# Patient Record
Sex: Male | Born: 1935 | Race: White | Hispanic: No | Marital: Married | Smoking: Former smoker
Health system: Southern US, Community
[De-identification: ages and names within clinical notes are randomized; demographics above are authoritative.]

## PROBLEM LIST (undated history)

## (undated) DIAGNOSIS — K219 Gastro-esophageal reflux disease without esophagitis: Secondary | ICD-10-CM

## (undated) DIAGNOSIS — C3490 Malignant neoplasm of unspecified part of unspecified bronchus or lung: Secondary | ICD-10-CM

## (undated) DIAGNOSIS — Z950 Presence of cardiac pacemaker: Secondary | ICD-10-CM

## (undated) DIAGNOSIS — I2510 Atherosclerotic heart disease of native coronary artery without angina pectoris: Secondary | ICD-10-CM

## (undated) DIAGNOSIS — R05 Cough: Secondary | ICD-10-CM

## (undated) DIAGNOSIS — Z95828 Presence of other vascular implants and grafts: Secondary | ICD-10-CM

## (undated) DIAGNOSIS — I480 Paroxysmal atrial fibrillation: Secondary | ICD-10-CM

## (undated) DIAGNOSIS — Z87438 Personal history of other diseases of male genital organs: Secondary | ICD-10-CM

## (undated) DIAGNOSIS — H409 Unspecified glaucoma: Secondary | ICD-10-CM

## (undated) DIAGNOSIS — E7800 Pure hypercholesterolemia, unspecified: Secondary | ICD-10-CM

## (undated) DIAGNOSIS — I519 Heart disease, unspecified: Secondary | ICD-10-CM

## (undated) DIAGNOSIS — I272 Pulmonary hypertension, unspecified: Secondary | ICD-10-CM

## (undated) DIAGNOSIS — H9313 Tinnitus, bilateral: Secondary | ICD-10-CM

## (undated) DIAGNOSIS — Z7901 Long term (current) use of anticoagulants: Secondary | ICD-10-CM

## (undated) DIAGNOSIS — I495 Sick sinus syndrome: Secondary | ICD-10-CM

## (undated) DIAGNOSIS — K208 Other esophagitis without bleeding: Secondary | ICD-10-CM

## (undated) DIAGNOSIS — I482 Chronic atrial fibrillation, unspecified: Secondary | ICD-10-CM

## (undated) DIAGNOSIS — R001 Bradycardia, unspecified: Secondary | ICD-10-CM

## (undated) DIAGNOSIS — E669 Obesity, unspecified: Secondary | ICD-10-CM

## (undated) DIAGNOSIS — N182 Chronic kidney disease, stage 2 (mild): Secondary | ICD-10-CM

## (undated) DIAGNOSIS — I5022 Chronic systolic (congestive) heart failure: Secondary | ICD-10-CM

## (undated) DIAGNOSIS — R053 Chronic cough: Secondary | ICD-10-CM

## (undated) DIAGNOSIS — C801 Malignant (primary) neoplasm, unspecified: Secondary | ICD-10-CM

## (undated) DIAGNOSIS — I10 Essential (primary) hypertension: Secondary | ICD-10-CM

## (undated) DIAGNOSIS — J189 Pneumonia, unspecified organism: Secondary | ICD-10-CM

## (undated) HISTORY — PX: COLONOSCOPY: SHX174

## (undated) HISTORY — DX: Bradycardia, unspecified: R00.1

## (undated) HISTORY — DX: Obesity, unspecified: E66.9

## (undated) HISTORY — DX: Sick sinus syndrome: I49.5

## (undated) HISTORY — DX: Other esophagitis without bleeding: K20.80

## (undated) HISTORY — DX: Atherosclerotic heart disease of native coronary artery without angina pectoris: I25.10

## (undated) HISTORY — DX: Chronic kidney disease, stage 2 (mild): N18.2

## (undated) HISTORY — DX: Chronic cough: R05.3

## (undated) HISTORY — DX: Unspecified glaucoma: H40.9

## (undated) HISTORY — DX: Pulmonary hypertension, unspecified: I27.20

## (undated) HISTORY — DX: Gastro-esophageal reflux disease without esophagitis: K21.9

## (undated) HISTORY — DX: Malignant neoplasm of unspecified part of unspecified bronchus or lung: C34.90

## (undated) HISTORY — DX: Heart disease, unspecified: I51.9

## (undated) HISTORY — DX: Personal history of other diseases of male genital organs: Z87.438

## (undated) HISTORY — DX: Pneumonia, unspecified organism: J18.9

## (undated) HISTORY — DX: Cough: R05

## (undated) HISTORY — DX: Presence of cardiac pacemaker: Z95.0

## (undated) HISTORY — DX: Tinnitus, bilateral: H93.13

## (undated) HISTORY — DX: Chronic systolic (congestive) heart failure: I50.22

## (undated) HISTORY — DX: Paroxysmal atrial fibrillation: I48.0

## (undated) HISTORY — PX: INSERT / REPLACE / REMOVE PACEMAKER: SUR710

## (undated) HISTORY — DX: Essential (primary) hypertension: I10

## (undated) HISTORY — PX: CARDIAC CATHETERIZATION: SHX172

## (undated) HISTORY — DX: Chronic atrial fibrillation, unspecified: I48.20

## (undated) HISTORY — DX: Pure hypercholesterolemia, unspecified: E78.00

## (undated) HISTORY — DX: Long term (current) use of anticoagulants: Z79.01

---
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
## 2003-01-08 ENCOUNTER — Ambulatory Visit (HOSPITAL_COMMUNITY): Admission: RE | Admit: 2003-01-08 | Discharge: 2003-01-08 | Payer: Self-pay | Admitting: Pulmonary Disease

## 2003-06-21 ENCOUNTER — Other Ambulatory Visit: Admission: RE | Admit: 2003-06-21 | Discharge: 2003-06-21 | Payer: Self-pay | Admitting: Dermatology

## 2005-01-28 ENCOUNTER — Other Ambulatory Visit: Admission: RE | Admit: 2005-01-28 | Discharge: 2005-01-28 | Payer: Self-pay | Admitting: Dermatology

## 2005-10-12 ENCOUNTER — Ambulatory Visit (HOSPITAL_COMMUNITY): Admission: RE | Admit: 2005-10-12 | Discharge: 2005-10-12 | Payer: Self-pay | Admitting: Pulmonary Disease

## 2008-04-30 HISTORY — PX: CORONARY STENT PLACEMENT: SHX1402

## 2008-05-01 ENCOUNTER — Inpatient Hospital Stay (HOSPITAL_COMMUNITY): Admission: EM | Admit: 2008-05-01 | Discharge: 2008-05-05 | Payer: Self-pay | Admitting: Cardiology

## 2008-05-01 ENCOUNTER — Encounter: Payer: Self-pay | Admitting: Emergency Medicine

## 2010-01-13 ENCOUNTER — Ambulatory Visit: Payer: Self-pay | Admitting: Cardiology

## 2010-01-13 ENCOUNTER — Encounter: Admission: RE | Admit: 2010-01-13 | Discharge: 2010-01-13 | Payer: Self-pay | Admitting: Cardiology

## 2010-06-12 LAB — BASIC METABOLIC PANEL
BUN: 10 mg/dL (ref 6–23)
BUN: 10 mg/dL (ref 6–23)
BUN: 8 mg/dL (ref 6–23)
BUN: 8 mg/dL (ref 6–23)
CO2: 26 mEq/L (ref 19–32)
CO2: 28 mEq/L (ref 19–32)
CO2: 28 mEq/L (ref 19–32)
CO2: 28 mEq/L (ref 19–32)
Calcium: 8.3 mg/dL — ABNORMAL LOW (ref 8.4–10.5)
Calcium: 8.3 mg/dL — ABNORMAL LOW (ref 8.4–10.5)
Calcium: 8.8 mg/dL (ref 8.4–10.5)
Calcium: 8.8 mg/dL (ref 8.4–10.5)
Chloride: 101 mEq/L (ref 96–112)
Chloride: 103 mEq/L (ref 96–112)
Chloride: 104 mEq/L (ref 96–112)
Chloride: 105 mEq/L (ref 96–112)
Creatinine, Ser: 0.98 mg/dL (ref 0.4–1.5)
Creatinine, Ser: 1.08 mg/dL (ref 0.4–1.5)
Creatinine, Ser: 1.11 mg/dL (ref 0.4–1.5)
Creatinine, Ser: 1.15 mg/dL (ref 0.4–1.5)
GFR calc Af Amer: >60 mL/min (ref >60)
GFR calc Af Amer: >60 mL/min (ref >60)
GFR calc Af Amer: >60 mL/min (ref >60)
GFR calc Af Amer: >60 mL/min (ref >60)
GFR calc non Af Amer: >60 mL/min (ref >60)
GFR calc non Af Amer: >60 mL/min (ref >60)
GFR calc non Af Amer: >60 mL/min (ref >60)
GFR calc non Af Amer: >60 mL/min (ref >60)
Glucose, Bld: 136 mg/dL — ABNORMAL HIGH (ref 70–99)
Glucose, Bld: 89 mg/dL (ref 70–99)
Glucose, Bld: 93 mg/dL (ref 70–99)
Glucose, Bld: 95 mg/dL (ref 70–99)
Potassium: 3.6 mEq/L (ref 3.5–5.1)
Potassium: 3.7 mEq/L (ref 3.5–5.1)
Potassium: 3.8 mEq/L (ref 3.5–5.1)
Potassium: 4.1 mEq/L (ref 3.5–5.1)
Sodium: 133 mEq/L — ABNORMAL LOW (ref 135–145)
Sodium: 134 mEq/L — ABNORMAL LOW (ref 135–145)
Sodium: 136 mEq/L (ref 135–145)
Sodium: 137 mEq/L (ref 135–145)

## 2010-06-12 LAB — LIPID PANEL
Cholesterol: 145 mg/dL (ref 0–200)
HDL: 19 mg/dL — ABNORMAL LOW (ref >39)
LDL Cholesterol: 99 mg/dL (ref 0–99)
Total CHOL/HDL Ratio: 7.6 RATIO
Triglycerides: 133 mg/dL (ref <150)
VLDL: 27 mg/dL (ref 0–40)

## 2010-06-12 LAB — CBC
HCT: 35.7 % — ABNORMAL LOW (ref 39.0–52.0)
HCT: 39.5 % (ref 39.0–52.0)
HCT: 40.5 % (ref 39.0–52.0)
HCT: 40.6 % (ref 39.0–52.0)
HCT: 42.9 % (ref 39.0–52.0)
Hemoglobin: 12.6 g/dL — ABNORMAL LOW (ref 13.0–17.0)
Hemoglobin: 13.9 g/dL (ref 13.0–17.0)
Hemoglobin: 14.1 g/dL (ref 13.0–17.0)
Hemoglobin: 14.1 g/dL (ref 13.0–17.0)
Hemoglobin: 14.8 g/dL (ref 13.0–17.0)
MCHC: 34.6 g/dL (ref 30.0–36.0)
MCHC: 34.8 g/dL (ref 30.0–36.0)
MCHC: 34.9 g/dL (ref 30.0–36.0)
MCHC: 35.2 g/dL (ref 30.0–36.0)
MCHC: 35.4 g/dL (ref 30.0–36.0)
MCV: 96.3 fL (ref 78.0–100.0)
MCV: 97 fL (ref 78.0–100.0)
MCV: 97.1 fL (ref 78.0–100.0)
MCV: 97.1 fL (ref 78.0–100.0)
MCV: 97.2 fL (ref 78.0–100.0)
Platelets: 130 10*3/uL — ABNORMAL LOW (ref 150–400)
Platelets: 138 10*3/uL — ABNORMAL LOW (ref 150–400)
Platelets: 155 10*3/uL (ref 150–400)
Platelets: 157 10*3/uL (ref 150–400)
Platelets: 162 10*3/uL (ref 150–400)
RBC: 3.68 MIL/uL — ABNORMAL LOW (ref 4.22–5.81)
RBC: 4.07 MIL/uL — ABNORMAL LOW (ref 4.22–5.81)
RBC: 4.16 MIL/uL — ABNORMAL LOW (ref 4.22–5.81)
RBC: 4.18 MIL/uL — ABNORMAL LOW (ref 4.22–5.81)
RBC: 4.45 MIL/uL (ref 4.22–5.81)
RDW: 13.2 % (ref 11.5–15.5)
RDW: 13.2 % (ref 11.5–15.5)
RDW: 13.3 % (ref 11.5–15.5)
RDW: 13.3 % (ref 11.5–15.5)
RDW: 13.5 % (ref 11.5–15.5)
WBC: 6.7 10*3/uL (ref 4.0–10.5)
WBC: 6.8 10*3/uL (ref 4.0–10.5)
WBC: 7.3 10*3/uL (ref 4.0–10.5)
WBC: 8.3 10*3/uL (ref 4.0–10.5)
WBC: 8.6 10*3/uL (ref 4.0–10.5)

## 2010-06-12 LAB — CARDIAC PANEL(CRET KIN+CKTOT+MB+TROPI)
CK, MB: 3.7 ng/mL (ref 0.3–4.0)
CK, MB: 3.8 ng/mL (ref 0.3–4.0)
CK, MB: 4.5 ng/mL — ABNORMAL HIGH (ref 0.3–4.0)
CK, MB: 5.4 ng/mL — ABNORMAL HIGH (ref 0.3–4.0)
CK, MB: 6.1 ng/mL — ABNORMAL HIGH (ref 0.3–4.0)
Relative Index: 3.7 — ABNORMAL HIGH (ref 0.0–2.5)
Relative Index: 6.1 — ABNORMAL HIGH (ref 0.0–2.5)
Relative Index: INVALID (ref 0.0–2.5)
Relative Index: INVALID (ref 0.0–2.5)
Relative Index: INVALID (ref 0.0–2.5)
Total CK: 100 U/L (ref 7–232)
Total CK: 101 U/L (ref 7–232)
Total CK: 74 U/L (ref 7–232)
Total CK: 81 U/L (ref 7–232)
Total CK: 90 U/L (ref 7–232)
Troponin I: 0.02 ng/mL (ref 0.00–0.06)
Troponin I: 0.06 ng/mL (ref 0.00–0.06)
Troponin I: 0.11 ng/mL — ABNORMAL HIGH (ref 0.00–0.06)
Troponin I: 0.13 ng/mL — ABNORMAL HIGH (ref 0.00–0.06)
Troponin I: 0.25 ng/mL — ABNORMAL HIGH (ref 0.00–0.06)

## 2010-06-12 LAB — COMPREHENSIVE METABOLIC PANEL
ALT: 20 U/L (ref 0–53)
ALT: 23 U/L (ref 0–53)
AST: 19 U/L (ref 0–37)
AST: 23 U/L (ref 0–37)
Albumin: 3.3 g/dL — ABNORMAL LOW (ref 3.5–5.2)
Albumin: 4 g/dL (ref 3.5–5.2)
Alkaline Phosphatase: 44 U/L (ref 39–117)
Alkaline Phosphatase: 52 U/L (ref 39–117)
BUN: 10 mg/dL (ref 6–23)
BUN: 10 mg/dL (ref 6–23)
CO2: 25 mEq/L (ref 19–32)
CO2: 28 mEq/L (ref 19–32)
Calcium: 8.3 mg/dL — ABNORMAL LOW (ref 8.4–10.5)
Calcium: 9.1 mg/dL (ref 8.4–10.5)
Chloride: 101 mEq/L (ref 96–112)
Chloride: 99 mEq/L (ref 96–112)
Creatinine, Ser: 1.02 mg/dL (ref 0.4–1.5)
Creatinine, Ser: 1.16 mg/dL (ref 0.4–1.5)
GFR calc Af Amer: >60 mL/min (ref >60)
GFR calc Af Amer: >60 mL/min (ref >60)
GFR calc non Af Amer: >60 mL/min (ref >60)
GFR calc non Af Amer: >60 mL/min (ref >60)
Glucose, Bld: 128 mg/dL — ABNORMAL HIGH (ref 70–99)
Glucose, Bld: 138 mg/dL — ABNORMAL HIGH (ref 70–99)
Potassium: 3.3 mEq/L — ABNORMAL LOW (ref 3.5–5.1)
Potassium: 3.6 mEq/L (ref 3.5–5.1)
Sodium: 133 mEq/L — ABNORMAL LOW (ref 135–145)
Sodium: 136 mEq/L (ref 135–145)
Total Bilirubin: 0.3 mg/dL (ref 0.3–1.2)
Total Bilirubin: 0.7 mg/dL (ref 0.3–1.2)
Total Protein: 5.6 g/dL — ABNORMAL LOW (ref 6.0–8.3)
Total Protein: 6.7 g/dL (ref 6.0–8.3)

## 2010-06-12 LAB — DIFFERENTIAL
Basophils Absolute: 0 10*3/uL (ref 0.0–0.1)
Basophils Absolute: 0 10*3/uL (ref 0.0–0.1)
Basophils Relative: 0 % (ref 0–1)
Basophils Relative: 1 % (ref 0–1)
Eosinophils Absolute: 0.1 10*3/uL (ref 0.0–0.7)
Eosinophils Absolute: 0.1 10*3/uL (ref 0.0–0.7)
Eosinophils Relative: 1 % (ref 0–5)
Eosinophils Relative: 2 % (ref 0–5)
Lymphocytes Relative: 26 % (ref 12–46)
Lymphocytes Relative: 29 % (ref 12–46)
Lymphs Abs: 1.7 10*3/uL (ref 0.7–4.0)
Lymphs Abs: 2 10*3/uL (ref 0.7–4.0)
Monocytes Absolute: 0.3 10*3/uL (ref 0.1–1.0)
Monocytes Absolute: 0.4 10*3/uL (ref 0.1–1.0)
Monocytes Relative: 4 % (ref 3–12)
Monocytes Relative: 6 % (ref 3–12)
Neutro Abs: 4.2 10*3/uL (ref 1.7–7.7)
Neutro Abs: 4.6 10*3/uL (ref 1.7–7.7)
Neutrophils Relative %: 62 % (ref 43–77)
Neutrophils Relative %: 68 % (ref 43–77)

## 2010-06-12 LAB — POCT CARDIAC MARKERS
CKMB, poc: 1.5 ng/mL (ref 1.0–8.0)
Myoglobin, poc: 95.7 ng/mL (ref 12–200)
Troponin i, poc: <0.05 ng/mL (ref 0.00–0.09)

## 2010-06-12 LAB — POCT I-STAT, CHEM 8
BUN: 10 mg/dL (ref 6–23)
Calcium, Ion: 1.22 mmol/L (ref 1.12–1.32)
Chloride: 102 mEq/L (ref 96–112)
Creatinine, Ser: 1.1 mg/dL (ref 0.4–1.5)
Glucose, Bld: 131 mg/dL — ABNORMAL HIGH (ref 70–99)
HCT: 42 % (ref 39.0–52.0)
Hemoglobin: 14.3 g/dL (ref 13.0–17.0)
Potassium: 3.4 mEq/L — ABNORMAL LOW (ref 3.5–5.1)
Sodium: 136 mEq/L (ref 135–145)
TCO2: 24 mmol/L (ref 0–100)

## 2010-06-12 LAB — PROTIME-INR
INR: 1.1 (ref 0.00–1.49)
Prothrombin Time: 14.9 seconds (ref 11.6–15.2)

## 2010-06-12 LAB — APTT: aPTT: 56 seconds — ABNORMAL HIGH (ref 24–37)

## 2010-06-12 LAB — BRAIN NATRIURETIC PEPTIDE: Pro B Natriuretic peptide (BNP): 91.4 pg/mL (ref 0.0–100.0)

## 2010-07-15 NOTE — Cardiovascular Report (Signed)
NAME:  Nicholas Sanchez, Nicholas Sanchez NO.:  [id number]   MEDICAL RECORD NO.:  [id number]          PATIENT TYPE:  INP   LOCATION:  2909                         FACILITY:  [facility]   PHYSICIAN:  Colleen Can. Deborah Chalk, M.D.DATE OF BIRTH:  [birth year]   DATE OF PROCEDURE:  05/01/2008  DATE OF DISCHARGE:                            CARDIAC CATHETERIZATION   HISTORY:  Mr. Grobe presents with acute ST-elevation myocardial  infarction with a 3-day history of chest pain, that is worse throughout  the day.  EKG showed ST elevation inferiorly with reciprocal changes.   PROCEDURE:  Left heart catheterization with selective coronary  angiography, left ventricular angiography, and stent placement  sequentially in the large dominant right coronary artery with left  ventricular angiography.   TYPE AND SITE OF ENTRY:  Percutaneous right femoral artery with Angio-  Seal.   CATHETERS:  6-French, 4-curved Judkins right and left coronary catheter,  6-French pigtail ventriculographic catheter, a JR-4 with side holes  guide, a right coronary artery bypass graft with side holes guide, and  an L2 Amplatz guide with side holes.   GUIDEWIRES:  Prowater and BMW.   ANGIOPLASTY BALLOONS:  A 3.0 x 15-mm apex balloon to predilate and a 3.5  x 15-mm Multi-Link stent in both proximal and distally.   HEMODYNAMIC DATA:  The aortic pressure was 112/53.  LV pressure was  115/4-13.   ANGIOGRAPHIC DATA:  1. Left main coronary artery is large and normal.  2. Left circumflex was a small vessel.  It had a small obtuse marginal      and irregularities.  3. Left anterior descending had severe stenosis proximally before the      first septal perforating branch.  It was a moderate-size vessel      that would be between 2.5 and 3 mm in diameter.  The proximal      stenosis is estimated to be at 70%.  4. Intermediate coronary artery.  There is a moderate-size      intermediate coronary artery without severe focal  narrowing.  5. Right coronary artery.  The right coronary artery is a large      dominant vessel.  There is 90% stenosis proximally and a 95%      stenosis before the crux.   Left ventricular angiogram was performed in the RAO position.  Overall  cardiac size and silhouette were normal.  Global ejection fraction  estimated to be at 50-55%.  There is very minimal inferior hypokinesis  present.   ANGIOPLASTY PROCEDURE:  We turned our attention to the right coronary  artery which obviously was then culprit vessel.  However, it did have  TIMI grade 3 flow and the resultant preparation for the procedure  resulted in mild delays which were felt to not be consequential because  of the adequate flow.  It should be noted that door-to-balloon time may  be slightly increased in large part related to the fact that there was  TIMI grade 3 flow to this vessel.  We initially used the FR-4 with side  holes guide and did not have satisfactory backup.  We turned  our  attention to the guide and used the right coronary artery bypass graft  with side holes and we were able to predilate with a 3.0 apex balloon  both proximally and distally.  However, we were unable to pass the stent  with this guide.  We then removed the guidewires, we then used an AL-1  with side holes guide.  With that and with a Prowater wire and with a  BMW wire as a buddy wire, we were able to pass the Multi-Link Vision  stent across distal stenosis.  It was inflated to maximum of 17  atmospheres.  We placed a second Multi-Link Vision stent in the proximal  portion.  It was inflated to a maximum of 16 atmospheres.  The final  angiographic result was felt to be excellent with no residual stenosis.   OVERALL IMPRESSION:  1. Stuttering inferior myocardial infarction with well-preserved      global left ventricular function.  2. Successful stent placement of severe stenosis in the right coronary      artery.  3. Residual severe stenosis  in the proximal left anterior descending      coronary.   DISCUSSION:  We will address the left anterior descending coronary at a  different setting.      Colleen Can. Deborah Chalk, M.D.  Electronically Signed     SNT/MEDQ  D:  05/01/2008  T:  05/02/2008  Job:  [id number]   cc:   Samuel Jester, DO  Oneal Deputy. Juanetta Gosling, M.D.

## 2010-07-15 NOTE — H&P (Signed)
NAME:  CECILE, GUEVARA NO.:  [id number]   MEDICAL RECORD NO.:  [id number]          PATIENT TYPE:  INP   LOCATION:  2909                         FACILITY:  [facility]   PHYSICIAN:  Colleen Can. Deborah Chalk, M.D.DATE OF BIRTH:  1933-12-21   DATE OF ADMISSION:  05/01/2008  DATE OF DISCHARGE:                              HISTORY & PHYSICAL   CHIEF COMPLAINT:  Chest pain. Left arm pain.   HISTORY OF PRESENT ILLNESS:  Mr. Delo is a 77 year old male who  presents with acute ST elevation MI.  Code STEMI has been called.  He  has had a 3-day history of left arm pain and numbness that worsened  today.  He presented to [facility] where he was noted to have  ST elevation in leads II, III, and aVF with reciprocal changes.  He was  subsequently transferred to the [facility]ter[facility]  for acute intervention.   PAST MEDICAL HISTORY:  1. Hypertension.  2. GERD.  3. LVH.   ALLERGIES:  None.   CURRENT MEDICATIONS:  Metoprolol and hydrochlorothiazide. Doses are not  able to be obtained at this time.   FAMILY HISTORY:  Not able to be obtained due to the emergent nature of  the situation.   SOCIAL HISTORY:  He is married.  He is retired from Airline pilot.  He currently  does not smoke and has no alcohol use.   REVIEW OF SYSTEMS:  He has had some GERD, but otherwise he has really  been in good state of health.  He had a recent physical at the [place].  All  other review of systems are negative.   PHYSICAL EXAMINATION:  GENERAL:  He is awake, alert, and able to answer  appropriately.  VITAL SIGNS:  Blood pressure was 140/87, heart rate is 59, respiratory  rate is 20, O2 saturation was 100%.  SKIN:  Warm and dry.  Color is unremarkable.  HEENT:  Normocephalic and atraumatic.  Pupils are equal.  Conjunctiva is  normal.  NECK:  Supple.  No masses.  No JVD.  LUNGS:  Essentially clear. Not dyspneic.  CARDIAC:  Regular rate and rhythm.  There is no murmur.  ABDOMEN:   Soft.  Positive bowel sounds and nontender.  EXTREMITIES:  Without edema.  MUSCULOSKELETAL:  Range of motion and strength appear to be adequate and  normal. Gait not tested.  NEUROLOGIC:  No gross focal deficits.   PERTINENT LABORATORIES:  EKG shows ST elevation in leads II, III, and  aVF with reciprocal changes in I and AVL versus LVH. Other labs are in  process.   ASSESSMENT:  1. Acute STEMI.  2. Left ventricular hypertrophy.  3. History of hypertension.  4. Gastroesophageal reflux disease.  5. Obesity.   PLAN:  The patient is taken acutely to the cardiac catheterization lab.  The risks, procedure, and benefits have all been explained and he is  willing to proceed on. Further treatment to follow per Dr. Ronnald Nian  discretion.      Sharlee Blew, N.P.      Colleen Can. Deborah Chalk, M.D.  Electronically Signed  LC/MEDQ  D:  05/01/2008  T:  05/02/2008  Job:  [id number]

## 2010-07-15 NOTE — Cardiovascular Report (Signed)
NAME:  Nicholas Sanchez, Nicholas Sanchez NO.:  [id number]   MEDICAL RECORD NO.:  [id number]          PATIENT TYPE:  INP   LOCATION:  2502                         FACILITY:  [facility]   PHYSICIAN:  Colleen Can. Deborah Chalk, M.D.DATE OF BIRTH:  1933/04/17   DATE OF PROCEDURE:  05/04/2008  DATE OF DISCHARGE:                            CARDIAC CATHETERIZATION   PROCEDURE:  Stent in the left anterior descending with a re-look at the  previous stents placed in right coronary artery.   TYPE AND SITE OF ENTRY:  Percutaneous right femoral artery with Angio-  Seal.   CATHETERS:  A 6-French JR-4 diagnostic catheter, 6-French FL-4 guide  catheter (only marginally satisfactory), Prowater guidewire,  predilatation with a 2.5 x 15-mm Apex balloon with stent being a 23 x  3.5 Xience stent (drug-eluting) and a 20 mm x 3.75 Quantum Maverick for  post dilatation.   MEDICATION GIVEN PRIOR TO PROCEDURE:  Valium 10 mg p.o. as well as a  nitroglycerin drip.   MEDICATION GIVEN DURING THE PROCEDURE:  Angiomax and Versed.   COMMENT:  The patient tolerated the procedure well.   PROCEDURE:  Initial angiograms of the right coronary artery were  obtained.  This demonstrated persistent patency of the stent to the  proximal and distal right coronary artery.  The distal stent had some  mild haziness within the stent but there was no significant luminal  narrowing present.  There was excellent flow distally.   We then turned our attention to the left anterior descending.  We used a  6-French JL-4 guide.  This provided satisfactory backup, but perhaps was  slightly undersized.  The Prowater guidewire was positioned across the  stenosis.  We predilated with a 2.5 x 15 mm Apex balloon to 15  atmospheres.  We then were able to place the 3.5 x 23 mm Xience stent  without difficulty.  This was inflated to a maximum of 16 atmospheres.  We then returned with a 3.75 x 20-mm Quantum Maverick and inflated to a  maximum of 20  atmospheres for 30 seconds.  The final angiographic result  was excellent.  There was a full dilatation of the stent.  It was a  relatively smooth transition from the stented segment to the native  vessel and if anything the stented segment would be slightly larger than  the native vessel and is felt to be an excellent result.   OVERALL IMPRESSION:  1. Persistent patency of the stents in the right coronary artery.  2. Successful stent placement in the proximal left anterior      descending.   ADDENDUM:  Angio-Seal was used and Ancef was given.      Colleen Can. Deborah Chalk, M.D.  Electronically Signed     SNT/MEDQ  D:  05/04/2008  T:  05/05/2008  Job:  [id number]   cc:   Ramon Dredge L. Juanetta Gosling, M.D.

## 2010-07-18 ENCOUNTER — Encounter: Payer: Self-pay | Admitting: Cardiology

## 2010-07-18 NOTE — Discharge Summary (Signed)
NAME:  Nicholas Sanchez, Nicholas Sanchez NO.:  [id number]   MEDICAL RECORD NO.:  [id number]          PATIENT TYPE:  INP   LOCATION:  2502                         FACILITY:  [facility]   PHYSICIAN:  Colleen Can. Deborah Chalk, M.D.DATE OF BIRTH:  [birth year]   DATE OF ADMISSION:  05/01/2008  DATE OF DISCHARGE:  05/05/2008                               DISCHARGE SUMMARY   DISCHARGE DIAGNOSES:  1. ST-elevation myocardial infarction, inferiorly status post emergent      percutaneous coronary intervention.  Cardiac catheterization      findings on May 01, 2008, showed normal left main, small      irregularities in left circumflex, 70% plus proximal narrowing in      the left anterior descending, 90% proximal narrowing in the right      coronary artery with a 95% distal narrowing, ejection fraction was      55%.  Subsequent stenting x2 with a nondrug-eluting stents were      placed, these were Vision 3.5 x 15 mm and 3.5 x 15 mm with an      overall excellent result obtained.  2. Residual coronary artery disease with subsequent repeat cardiac      catheterization on May 04, 2008, showing the stents in the right      coronary to be patent.  Subsequent angioplasty and stenting of the      proximal left anterior descending ensued with a 3.5 x 23 mm Xience      (drug eluting) stent placed with an overall satisfactory result      obtained.  The patient is committed to Plavix for 1 year.  3. Hypertension.  4. Gastroesophageal reflux disease.  5. Left ventricular hypertrophy.  6. Hyperlipidemia.   HISTORY OF PRESENT ILLNESS:  The patient is a 77 year old white male who  presented emergently to the [facility] with an acute ST-  elevation MI.  Code STEMI had been called.  He reported a 3-day history  of left arm pain and numbness that worsened on the day of admission.  When he presented to [facility] originally, he was noted to  have ST elevation in leads II, III, and aVF with typical  changes.  He  was subsequently brought to the Cardiac Catheterization Lab at [facility] for acute intervention.   Please see the history and physical for further patient's presentation  and profile.   LABORATORY DATA:  On admission, basically, his cardiac enzymes were  negative; however, his third troponin was slightly positive at 0.11.  BNP was 91.  CBC was normal with a hematocrit of 40, white count 8,  platelets 157.  Chemistry showed a sodium 137, potassium 4.1, chloride  104, CO2 of 28, BUN was 10, creatinine 1.1, and a glucose of 136.  His  EKG showed inferior and lateral changes.  His chest x-ray showed COPD.   HOSPITAL COURSE:  The patient was taken emergently upon arrival to the  Cardiac Catheterization Lab.  That procedure was tolerated well without  any known complications.  The results are as noted above.  He  subsequently  underwent percutaneous coronary intervention to the right  coronary artery.  He had 2 nondrug-eluting stents placed.  That  procedure was tolerated well, and he was transferred to the Coronary  Care Unit afterwards.  By the following morning, he had some  intermittent dizziness as well as some nausea, vomiting, and headache,  felt to be secondary to IV nitroglycerin.  This was subsequently  stopped, and the patient was treated with IV fluids.  Unfortunately, he  developed left arm pain again the following day for about 10-15 minutes.  It was responsive to nitroglycerin.  IV nitroglycerin was subsequently  resumed, and at this time, it was felt that he would need to have  intervention to the LAD before the patient was deemed ready for  discharge.  On May 04, 2008, he underwent repeat cardiac  catheterization.  Cineangiograms of the right coronary showed the stents  to be okay.  Subsequently, he had PCI to the proximal LAD with a 3.5 x  23 mm Xience (drug-eluting stent).  Postprocedure, he was transferred to  2500 and by May 05, 2008, he was  doing well without complaints and was  felt to be a stable candidate for discharge.   DISCHARGE DIET:  Low salt, heart healthy.   ACTIVITIES:  Increased as tolerated with no driving and no sexual  activity.   He is to use an ice pack as needed to the groin.   DISCHARGE MEDICINES:  1. Lopressor 25 b.i.d.  2. Zocor 40 mg a day.  3. Plavix 75 mg a day for 1 year.  4. Nitroglycerin p.r.n.  5. Enteric-coated aspirin 325 a day.  6. His hydrochlorothiazide that he was taking at home has been placed      on hold.   We will plan on seeing him back in the office in approximately 1 week,  certainly sooner if any problems would arise in the interim.      Sharlee Blew, N.P.      Colleen Can. Deborah Chalk, M.D.  Electronically Signed    LC/MEDQ  D:  05/31/2008  T:  06/01/2008  Job:  [id number]

## 2010-07-21 ENCOUNTER — Encounter: Payer: Self-pay | Admitting: Cardiology

## 2010-07-21 ENCOUNTER — Ambulatory Visit (INDEPENDENT_AMBULATORY_CARE_PROVIDER_SITE_OTHER): Payer: Medicare Other | Admitting: Cardiology

## 2010-07-21 VITALS — BP 122/84 | HR 56 | Ht 73.0 in | Wt 223.2 lb

## 2010-07-21 DIAGNOSIS — K219 Gastro-esophageal reflux disease without esophagitis: Secondary | ICD-10-CM | POA: Insufficient documentation

## 2010-07-21 DIAGNOSIS — E7800 Pure hypercholesterolemia, unspecified: Secondary | ICD-10-CM | POA: Insufficient documentation

## 2010-07-21 DIAGNOSIS — I10 Essential (primary) hypertension: Secondary | ICD-10-CM | POA: Insufficient documentation

## 2010-07-21 DIAGNOSIS — I2510 Atherosclerotic heart disease of native coronary artery without angina pectoris: Secondary | ICD-10-CM

## 2010-07-21 DIAGNOSIS — I259 Chronic ischemic heart disease, unspecified: Secondary | ICD-10-CM | POA: Insufficient documentation

## 2010-07-21 NOTE — Progress Notes (Signed)
Subjective:   Mr. Kerschner comes in today for followup visit. In general, he continues to do well. He's not had any chest pain or shortness of breath but he is not exercising on the greater basis. He has a history of stents in his right coronary artery and a stent in his left anterior descending in March 2010. He has a MultiLink stent in his right coronary artery the drug-eluting stent in his left anterior descending. In general, he's done well has not had recurrent cardiac symptoms. His last stress Cardiolite study was in October 2010 which was interpreted as being normal.   Current Outpatient Prescriptions  Medication Sig Dispense Refill  . aspirin 325 MG tablet Take 325 mg by mouth daily.        . clopidogrel (PLAVIX) 75 MG tablet Take 75 mg by mouth daily.        . cyanocobalamin 1000 MCG tablet Take 100 mcg by mouth daily.        . hydrochlorothiazide 25 MG tablet Take 25 mg by mouth daily.        . metoprolol tartrate (LOPRESSOR) 25 MG tablet Take 25 mg by mouth 2 (two) times daily.        [name] omeprazole (PRILOSEC) 40 MG capsule Take 40 mg by mouth daily.        . pravastatin (PRAVACHOL) 40 MG tablet Take 40 mg by mouth daily.        . Tamsulosin HCl (FLOMAX) 0.4 MG CAPS Take 0.4 mg by mouth daily. Not taking      . DISCONTD: famotidine (PEPCID) 40 MG tablet Take 40 mg by mouth at bedtime.        [name] DISCONTD: lisinopril (PRINIVIL,ZESTRIL) 5 MG tablet Take 5 mg by mouth daily.          Allergies  Allergen Reactions  . Ace Inhibitors     Patient Active Problem List  Diagnoses  . CAD (coronary artery disease)  . Ischemic heart disease  . HTN (hypertension)  . Hypercholesteremia  . GERD (gastroesophageal reflux disease)    History  Smoking status  . Former Smoker -- 1.0 packs/day for 14 years  . Types: Cigarettes  . Quit date: 03/02/1973  Smokeless tobacco  . Former Neurosurgeon  Comment: some    History  Alcohol Use No    Family History  Problem Relation Age of Onset  .  Emphysema Mother     mother died with emphysema and cancer  . Cancer Mother     Review of Systems:   The patient denies any heat or cold intolerance.  No weight gain or weight loss.  The patient denies headaches or blurry vision.  There is no cough or sputum production.  The patient denies dizziness.  There is no hematuria or hematochezia.  The patient denies any muscle aches or arthritis.  The patient denies any rash.  The patient denies frequent falling or instability.  There is no history of depression or anxiety.  All other systems were reviewed and are negative.   Physical Exam:   Weight is 223. Blood pressure 120/84. Heart rate 56.The head is normocephalic and atraumatic.  Pupils are equally round and reactive to light.  Sclerae nonicteric.  Conjunctiva is clear.  Oropharynx is unremarkable.  There's adequate oral airway.  Neck is supple there are no masses.  Thyroid is not enlarged.  There is no lymphadenopathy.  Lungs are clear.  Chest is symmetric.  Heart shows a regular rate and rhythm.  S1 and S2 are normal.  There is no murmur click or gallop.  Abdomen is soft normal bowel sounds.  There is no organomegaly.  Genital and rectal deferred.  Extremities are without edema.  Peripheral pulses are adequate.  Neurologically intact.  Full range of motion.  The patient is not depressed.  Skin is warm and dry.  Assessment / Plan:

## 2010-07-22 NOTE — Assessment & Plan Note (Signed)
Overall, he continues to do well.  We will continue aspirin and Plavix. He has lab work pending at the [place]. I will have him see Dr. Andee Lineman in folow up.

## 2010-08-28 ENCOUNTER — Other Ambulatory Visit: Payer: Self-pay | Admitting: *Deleted

## 2010-08-28 DIAGNOSIS — E785 Hyperlipidemia, unspecified: Secondary | ICD-10-CM

## 2010-08-29 ENCOUNTER — Encounter: Payer: Self-pay | Admitting: Cardiology

## 2010-08-29 ENCOUNTER — Other Ambulatory Visit (INDEPENDENT_AMBULATORY_CARE_PROVIDER_SITE_OTHER): Payer: Medicare Other | Admitting: *Deleted

## 2010-08-29 DIAGNOSIS — E785 Hyperlipidemia, unspecified: Secondary | ICD-10-CM

## 2010-08-29 LAB — BASIC METABOLIC PANEL
BUN: 15 mg/dL (ref 6–23)
CO2: 30 mEq/L (ref 19–32)
Calcium: 9.3 mg/dL (ref 8.4–10.5)
Chloride: 104 mEq/L (ref 96–112)
Creatinine, Ser: 1.2 mg/dL (ref 0.4–1.5)
GFR: 62.7 mL/min (ref >60.00)
Glucose, Bld: 98 mg/dL (ref 70–99)
Potassium: 4.5 mEq/L (ref 3.5–5.1)
Sodium: 139 mEq/L (ref 135–145)

## 2010-08-29 LAB — HEPATIC FUNCTION PANEL
ALT: 24 U/L (ref 0–53)
AST: 26 U/L (ref 0–37)
Albumin: 4.5 g/dL (ref 3.5–5.2)
Alkaline Phosphatase: 54 U/L (ref 39–117)
Bilirubin, Direct: 0.2 mg/dL (ref 0.0–0.3)
Total Bilirubin: 1 mg/dL (ref 0.3–1.2)
Total Protein: 7.1 g/dL (ref 6.0–8.3)

## 2010-08-29 LAB — LIPID PANEL
Cholesterol: 136 mg/dL (ref 0–200)
HDL: 30.8 mg/dL — ABNORMAL LOW (ref >39.00)
LDL Cholesterol: 86 mg/dL (ref 0–99)
Total CHOL/HDL Ratio: 4
Triglycerides: 94 mg/dL (ref 0.0–149.0)
VLDL: 18.8 mg/dL (ref 0.0–40.0)

## 2010-09-02 ENCOUNTER — Telehealth: Payer: Self-pay | Admitting: *Deleted

## 2010-09-02 NOTE — Telephone Encounter (Signed)
Notified of lab results. Sent to Dr. Juanetta Gosling.

## 2010-09-02 NOTE — Telephone Encounter (Signed)
Message copied by Lorayne Bender on Tue Sep 02, 2010 10:02 AM ------      Message from: Rosalio Macadamia      Created: Fri Aug 29, 2010  3:06 PM       Ok to report. Labs are satisfactory.  Stay on same medicines. Recheck BMET/HPF/LIPIDS  in 6 months. Patient is to see Dr. Andee Lineman for future cardiology follow up. Former patient of Dr. Deborah Chalk

## 2010-11-26 ENCOUNTER — Other Ambulatory Visit: Payer: Self-pay | Admitting: *Deleted

## 2010-11-26 MED ORDER — METOPROLOL TARTRATE 50 MG PO TABS: 25.0000 mg | ORAL_TABLET | Freq: Two times a day (BID) | ORAL | Status: DC

## 2011-03-04 ENCOUNTER — Ambulatory Visit (INDEPENDENT_AMBULATORY_CARE_PROVIDER_SITE_OTHER): Payer: Medicare Other | Admitting: Cardiology

## 2011-03-04 ENCOUNTER — Encounter: Payer: Self-pay | Admitting: Cardiology

## 2011-03-04 VITALS — BP 159/77 | HR 51 | Ht 73.0 in | Wt 225.0 lb

## 2011-03-04 DIAGNOSIS — I2510 Atherosclerotic heart disease of native coronary artery without angina pectoris: Secondary | ICD-10-CM

## 2011-03-04 NOTE — Patient Instructions (Signed)
Continue all current medications. Your physician wants you to follow up in: 6 months.  You will receive a reminder letter in the mail one-two months in advance.  If you don't receive a letter, please call our office to schedule the follow up appointment   

## 2011-03-16 ENCOUNTER — Encounter: Payer: Self-pay | Admitting: Cardiology

## 2011-03-16 DIAGNOSIS — I2510 Atherosclerotic heart disease of native coronary artery without angina pectoris: Secondary | ICD-10-CM | POA: Insufficient documentation

## 2011-03-16 NOTE — Progress Notes (Signed)
Nicholas Bottoms, MD, [facility] Board Certified in Adult Cardiovascular Medicine,Internal Medicine and Critical Care Medicine    CC: followup patient with history of coronary artery disease, former patient of Dr. Marolyn Hammock  HPI:  The patient is a 78 year old male with history of coronary artery disease, status post multiple  stent placement in the setting of an ST elevation myocardial infarction in 2010.  He is currently not taking Plavix anymore.  He has a history of hypertension, GERD, and hyperlipidemia.  Laboratory work is followed at the [place].  Although his blood pressure is elevated in the office.  He reports to me that his readings at home are within normal limits.  Typically around 130 mmHg. From a cardiovascular perspective he is stable.  He denies any chest pain, shortness of breath, orthopnea, PND.  He is able to perform greater than 5 METS.  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 History of BPH   . CAD (coronary artery disease)     status post ST elevation myocardial infarction March 2010, status post bare-metal stent x2 to the proximal and distal RCA.  Subsequent stent drug-eluting stent to the LAD in a staged fashion.,  Cardiolite October 2010, negative for ischemia with normal ejection fraction  . GERD (gastroesophageal reflux disease)   . Cough, persistent   . HTN (hypertension)   . Hypercholesteremia    Past Surgical History  Procedure Date  . Coronary stent placement 04/2008    RCA and LAD    Allergies/SH/FHX : available in Electronic Records for review  Allergies  Allergen Reactions  . Ace Inhibitors    History   Social History  . Marital Status: Single    Spouse Name: N/A    Number of Children: N/A  . Years of Education: N/A   Occupational History  . retired     Airline pilot   Social History Main Topics  . Smoking  status: Former Smoker -- 2.5 packs/day for 25 years    Types: Cigarettes    Quit date: 06/14/1973  . Smokeless tobacco: Former Neurosurgeon    Types: Chew    Quit date: 06/14/1973   Comment: some/chewed very little for about 2 years  . Alcohol Use: No  . Drug Use: No  . Sexually Active: Not on file   Other Topics Concern  . Not on file   Social History Narrative  . No narrative on file   Family History  Problem Relation Age of Onset  . Emphysema Mother     mother died with emphysema and cancer  . Cancer Mother     Medications: Current Outpatient Prescriptions  Medication Sig Dispense Refill  . aspirin 325 MG tablet Take 325 mg by mouth daily.        . cyanocobalamin 1000 MCG tablet Take 100 mcg by mouth daily.        . hydrochlorothiazide 25 MG tablet Take 25 mg by mouth daily.        . metoprolol (LOPRESSOR) 50 MG tablet Take 0.5 tablets (25 mg total) by mouth 2 (two) times daily.  30 tablet  3  . omeprazole (PRILOSEC) 40 MG capsule Take 40 mg by mouth daily.        . pravastatin (PRAVACHOL) 40 MG tablet Take 40 mg by mouth 3 (three) times a week.       [name]  Tamsulosin HCl (FLOMAX) 0.4 MG CAPS Take 0.4 mg by mouth daily.         ROS: No nausea or vomiting. No fever or chills.No melena or hematochezia.No bleeding.No claudication  Physical Exam: BP 159/77  Pulse 51  Ht 6\' 1"  (1.854 m)  Wt 225 lb (102.059 kg)  BMI 29.69 kg/m2 General:Well-nourished white male in no apparent distress. Neck:normal carotid upstroke and no carotid bruits.  No thyromegaly no nodes or thyroid.  JVP is 5 cm Lungs:clear breath sounds bilaterally without any wheezing Cardiac:regular rate and rhythm with normal S1, S2.  No pathological murmurs Vascular:no edema.  Normal distal pulses bilaterally Skin:warm and dry Physcologic:normal affect  12lead ECG:not performed Limited bedside ECHO:N/A   Patient Active Problem List  Diagnoses  . HTN (hypertension)- reportedly controlled per patient  .  Hypercholesteremia  . GERD (gastroesophageal reflux disease)  . CAD (coronary artery disease)-status post ST elevation myocardial infarction and stent placement of Plavix 2010    PLAN   The patient is doing well from a cardiovascular perspective.  He denies any chest pain.  He reports no ischemic complaints.  A bedside echocardiogram was performed which showed normal LV function and no significant mitral valve or aortic valve disease  Blood work is performed at the [place], there is followed for his dyslipidemia  Patient will followup as blood pressure at home, but it appears to be within normal limits and have made no changes in his medical regimen.

## 2011-03-24 ENCOUNTER — Other Ambulatory Visit: Payer: Self-pay | Admitting: *Deleted

## 2011-03-24 MED ORDER — METOPROLOL TARTRATE 50 MG PO TABS: 25.0000 mg | ORAL_TABLET | Freq: Two times a day (BID) | ORAL | Status: DC

## 2011-08-28 ENCOUNTER — Other Ambulatory Visit: Payer: Self-pay | Admitting: *Deleted

## 2011-08-28 ENCOUNTER — Telehealth: Payer: Self-pay | Admitting: Cardiology

## 2011-08-28 MED ORDER — PRAVASTATIN SODIUM 40 MG PO TABS: 40.0000 mg | ORAL_TABLET | ORAL | Status: DC

## 2011-08-28 NOTE — Telephone Encounter (Signed)
[facility] called and wanted to verify the patient's instructions.  E-Scripts has the patient taking 40 mg pravastatin as 1 p.o 3xwk.  The original Sig.is 40 mg 1xd.  Please call pharm.to clarify.

## 2011-08-28 NOTE — Telephone Encounter (Signed)
Refilled pravastatin. 

## 2011-08-31 NOTE — Telephone Encounter (Signed)
Confirmed rx.

## 2011-09-21 ENCOUNTER — Telehealth: Payer: Self-pay | Admitting: Cardiology

## 2011-09-21 NOTE — Telephone Encounter (Signed)
Patient called in and said that the pharmacy sent a refill request for his omeprazole and that they still hadn't received the refill.  I told him that the cardiologist doesn't refill this medication and to have the pharmacy sent refill request to his primary care doctor he said ok.

## 2011-09-24 ENCOUNTER — Telehealth: Payer: Self-pay | Admitting: Cardiology

## 2011-09-24 NOTE — Telephone Encounter (Signed)
Requested that pharmacy send refill request to primary care doctor.

## 2011-10-15 ENCOUNTER — Encounter: Payer: Self-pay | Admitting: Cardiology

## 2011-10-15 ENCOUNTER — Ambulatory Visit (INDEPENDENT_AMBULATORY_CARE_PROVIDER_SITE_OTHER): Payer: Medicare Other | Admitting: Cardiology

## 2011-10-15 VITALS — BP 152/66 | HR 47 | Ht 73.0 in | Wt 221.0 lb

## 2011-10-15 DIAGNOSIS — E7800 Pure hypercholesterolemia, unspecified: Secondary | ICD-10-CM

## 2011-10-15 DIAGNOSIS — I10 Essential (primary) hypertension: Secondary | ICD-10-CM

## 2011-10-15 MED ORDER — PRAVASTATIN SODIUM 40 MG PO TABS: 40.0000 mg | ORAL_TABLET | Freq: Every day | ORAL | Status: DC

## 2011-10-15 MED ORDER — HYDROCHLOROTHIAZIDE 25 MG PO TABS: 25.0000 mg | ORAL_TABLET | Freq: Every day | ORAL | Status: DC

## 2011-10-15 MED ORDER — OMEPRAZOLE 40 MG PO CPDR: 40.0000 mg | DELAYED_RELEASE_CAPSULE | Freq: Every day | ORAL | Status: DC

## 2011-10-15 NOTE — Assessment & Plan Note (Signed)
No recurrent chest pain. No indication for ischemia workup. Continue risk factor modifications

## 2011-10-15 NOTE — Assessment & Plan Note (Signed)
Refill Pravachol as also been provided.

## 2011-10-15 NOTE — Assessment & Plan Note (Signed)
Blood pressure uncontrolled the patient ran out of his hydrochlorothiazide refilled today

## 2011-10-15 NOTE — Assessment & Plan Note (Signed)
Stable the patient has her prescription refill on Prilosec

## 2011-10-15 NOTE — Progress Notes (Signed)
Nicholas Bottoms, MD, [facility] ABIM Board Certified in Adult Cardiovascular Medicine,Internal Medicine and Critical Care Medicine    CC:         Followup patient coronary artery disease. Former patient of Dr. Rudean Haskell                                                                         HPI:        Patient a stress test done 2 years ago which was within normal limits. He status post stent x3 in the past. His been doing well with no recurrent chest pain. He is very active and has no limitations in his exercise tolerance. He had stents placed x2 to the RCA and as well as to the LAD. Recently had lipid work done by the PA in the dermis triglycerides were significantly elevated. The patient doesn't or studies quite a bit of carbohydrates particularly widespread. I educated him to cut back on this. He also had an echocardiogram done recently which was within normal limits. He reports no heart failure symptoms orthopnea PND presyncope or syncope.   PMH: reviewed and listed in Problem List in Electronic Records (and see below) Past Medical History  Diagnosis Date  . History of BPH   . CAD (coronary artery disease)     status post ST elevation myocardial infarction March 2010, status post bare-metal stent x2 to the proximal and distal RCA.  Subsequent stent drug-eluting stent to the LAD in a staged fashion.,  Cardiolite October 2010, negative for ischemia with normal ejection fraction  . GERD (gastroesophageal reflux disease)   . Cough, persistent   . HTN (hypertension)   . Hypercholesteremia    Past Surgical History  Procedure Date  . Coronary stent placement 04/2008    RCA and LAD    Allergies/SH/FHX : available in Electronic Records for review  Allergies  Allergen Reactions  . Ace Inhibitors    History   Social History  . Marital Status: Single    Spouse Name: N/A    Number of Children: N/A  . Years of Education: N/A   Occupational History  . retired     Airline pilot   Social History  Main Topics  . Smoking status: Former Smoker -- 2.5 packs/day for 25 years    Types: Cigarettes    Quit date: 06/14/1973  . Smokeless tobacco: Former Neurosurgeon    Types: Chew    Quit date: 06/14/1973   Comment: some/chewed very little for about 2 years  . Alcohol Use: No  . Drug Use: No  . Sexually Active: Not on file   Other Topics Concern  . Not on file   Social History Narrative  . No narrative on file   Family History  Problem Relation Age of Onset  . Emphysema Mother     mother died with emphysema and cancer  . Cancer Mother     Medications: Current Outpatient Prescriptions  Medication Sig Dispense Refill  . aspirin 325 MG tablet Take 325 mg by mouth daily.        . metoprolol (LOPRESSOR) 50 MG tablet Take 0.5 tablets (25 mg total) by mouth 2 (two) times daily.  30 tablet  6  . omeprazole (  PRILOSEC) 40 MG capsule Take 1 capsule (40 mg total) by mouth daily.  90 capsule  3  . pravastatin (PRAVACHOL) 40 MG tablet Take 1 tablet (40 mg total) by mouth daily.  30 tablet  11  . saw palmetto 160 MG capsule Take 160 mg by mouth daily.      [name] DISCONTD: omeprazole (PRILOSEC) 40 MG capsule Take 40 mg by mouth daily.        [name] DISCONTD: pravastatin (PRAVACHOL) 40 MG tablet Take 1 tablet (40 mg total) by mouth 3 (three) times a week.  30 tablet  11  . hydrochlorothiazide (HYDRODIURIL) 25 MG tablet Take 1 tablet (25 mg total) by mouth daily.  90 tablet  3  . DISCONTD: hydrochlorothiazide 25 MG tablet Take 25 mg by mouth daily.          ROS: No nausea or vomiting. No fever or chills.No melena or hematochezia.No bleeding.No claudication  Physical Exam: BP 152/66  Pulse 47  Ht 6\' 1"  (1.854 m)  Wt 221 lb (100.245 kg)  BMI 29.16 kg/m2 General: Well-nourished white male in no distress Neck: Normal carotid upstroke no bruits. Thyromegaly. JVP fairly flat sitting in 90 angle Lungs: Clear breath sounds no wheezing Cardiac: Regular rate and rhythm normal S1-S2 Vascular: No edema. Normal  distal pulses Skin: Warm and dry Physcologic: Normal affect  12lead ECG: Not obtained Limited bedside ECHO:N/A No images are attached to the encounter.   I reviewed and summarized the old records. I reviewed ECG and prior blood work.  Assessment and Plan  CAD (coronary artery disease) No recurrent chest pain. No indication for ischemia workup. Continue risk factor modifications  GERD (gastroesophageal reflux disease) Stable the patient has her prescription refill on Prilosec  HTN (hypertension) Blood pressure uncontrolled the patient ran out of his hydrochlorothiazide refilled today  Hypercholesteremia Refill Pravachol as also been provided.    Patient Active Problem List  Diagnosis  . HTN (hypertension)  . Hypercholesteremia  . GERD (gastroesophageal reflux disease)  . CAD (coronary artery disease)

## 2011-10-15 NOTE — Patient Instructions (Addendum)
Your physician wants you to follow-up in: 1 year with Dr. De Gent.  You will receive a reminder letter in the mail two months in advance. If you don't receive a letter, please call our office to schedule the follow-up appointment.  

## 2011-11-03 ENCOUNTER — Other Ambulatory Visit: Payer: Self-pay | Admitting: Cardiology

## 2012-05-10 ENCOUNTER — Other Ambulatory Visit: Payer: Self-pay | Admitting: *Deleted

## 2012-05-10 MED ORDER — METOPROLOL TARTRATE 50 MG PO TABS: ORAL_TABLET | ORAL | Status: DC

## 2012-10-07 ENCOUNTER — Ambulatory Visit (INDEPENDENT_AMBULATORY_CARE_PROVIDER_SITE_OTHER): Payer: Medicare Other | Admitting: Cardiovascular Disease

## 2012-10-07 ENCOUNTER — Encounter: Payer: Self-pay | Admitting: Cardiovascular Disease

## 2012-10-07 VITALS — BP 124/75 | HR 46 | Ht 73.0 in | Wt 222.0 lb

## 2012-10-07 DIAGNOSIS — Z9861 Coronary angioplasty status: Secondary | ICD-10-CM

## 2012-10-07 DIAGNOSIS — Z955 Presence of coronary angioplasty implant and graft: Secondary | ICD-10-CM

## 2012-10-07 DIAGNOSIS — E7800 Pure hypercholesterolemia, unspecified: Secondary | ICD-10-CM

## 2012-10-07 DIAGNOSIS — I10 Essential (primary) hypertension: Secondary | ICD-10-CM

## 2012-10-07 DIAGNOSIS — R5383 Other fatigue: Secondary | ICD-10-CM

## 2012-10-07 DIAGNOSIS — I2510 Atherosclerotic heart disease of native coronary artery without angina pectoris: Secondary | ICD-10-CM

## 2012-10-07 MED ORDER — ROSUVASTATIN CALCIUM 5 MG PO TABS: 5.0000 mg | ORAL_TABLET | Freq: Every day | ORAL | Status: DC

## 2012-10-07 MED ORDER — NITROGLYCERIN 0.4 MG SL SUBL: 0.4000 mg | SUBLINGUAL_TABLET | SUBLINGUAL | Status: DC | PRN

## 2012-10-07 MED ORDER — METOPROLOL TARTRATE 25 MG PO TABS: 12.5000 mg | ORAL_TABLET | Freq: Two times a day (BID) | ORAL | Status: DC

## 2012-10-07 NOTE — Progress Notes (Signed)
Patient ID: Nicholas Sanchez, male   DOB: Aug 15, 1933, 79 y.o.   MRN: [id number]    SUBJECTIVE: Nicholas Sanchez has a h/o CAD with 2 stents to the RCA and 1 to the LAD. The LAD stent was placed in March 2010, which was his most recent cardiac catheterization as well.  He also has HTN and hyperlipidemia.  He reportedly had a stress test done 3-4 years ago which was within normal limits, and an echocardiogram which was also reportedly unremarkable.  His been doing well with no recurrent chest pain. He is very active and has no limitations in his exercise tolerance per se, but does get tired more quickly than usual.  He reports no heart failure symptoms such as orthopnea, PND, presyncope or syncope.   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  Daily statins have caused myalgias.   PMH: reviewed and listed in Problem List in Electronic Records (and see below)  Past Medical History   Diagnosis  Date   .  History of BPH    .  CAD (coronary artery disease)      status post ST elevation myocardial infarction March 2010, status post bare-metal stent x2 to the proximal and distal RCA. Subsequent stent drug-eluting stent to the LAD in a staged fashion., Cardiolite October 2010, negative for ischemia with normal ejection fraction   .  GERD (gastroesophageal reflux disease)    .  Cough, persistent    .  HTN (hypertension)    .  Hypercholesteremia     Past Surgical History   Procedure  Date   .  Coronary stent placement  04/2008     RCA and LAD   Allergies/SH/FHX : available in Electronic Records for review  Allergies   Allergen  Reactions   .  Ace Inhibitors        Filed Vitals:   10/07/12 0850  Height: 6\' 1"  (1.854 m)   Last recorded: 08/08 0850   BP: 124/75 Pulse: 46     PHYSICAL EXAM General: NAD Neck: No JVD, no thyromegaly or thyroid nodule.  Lungs: Clear to auscultation bilaterally with normal respiratory effort. CV: Nondisplaced PMI.  Heart  regular rhythm, bradycardic, S1/S2, no S3/S4, no murmur.  No peripheral edema.  No carotid bruit.  Normal pedal pulses.  Abdomen: Soft, nontender, no hepatosplenomegaly, no distention.  Neurologic: Alert and oriented x 3.  Psych: Normal affect. Extremities: No clubbing or cyanosis.     LABS: Basic Metabolic Panel: No results found for this basename: NA, K, CL, CO2, GLUCOSE, BUN, CREATININE, CALCIUM, MG, PHOS,  in the last 72 hours Liver Function Tests: No results found for this basename: AST, ALT, ALKPHOS, BILITOT, PROT, ALBUMIN,  in the last 72 hours No results found for this basename: LIPASE, AMYLASE,  in the last 72 hours CBC: No results found for this basename: WBC, NEUTROABS, HGB, HCT, MCV, PLT,  in the last 72 hours Cardiac Enzymes: No results found for this basename: CKTOTAL, CKMB, CKMBINDEX, TROPONINI,  in the last 72 hours BNP: No components found with this basename: POCBNP,  D-Dimer: No results found for this basename: DDIMER,  in the last 72 hours Hemoglobin A1C: No results found for this basename: HGBA1C,  in the last 72 hours Fasting Lipid Panel: No results found for this basename: CHOL, HDL, LDLCALC, TRIG, CHOLHDL, LDLDIRECT,  in the last 72 hours Thyroid Function Tests: No results found for this basename: TSH, T4TOTAL, FREET3, T3FREE,  THYROIDAB,  in the last 72 hours Anemia Panel: No results found for this basename: VITAMINB12, FOLATE, FERRITIN, TIBC, IRON, RETICCTPCT,  in the last 72 hours  RADIOLOGY: No results found.  ECG: Marked sinus  Bradycardia   -First degree A-V block  -PRi = 242 msec -Nonspecific QRS widening.  -Inferior ST-elevation -repolarization variant.  -Nonspecific ST depression   +   Nonspecific T-abnormality   -Nondiagnostic.     ASSESSMENT AND PLAN: 1. CAD s/p 2 stents to the RCA and 1 stent to the LAD: continue ASA, Metoprolol (reduced dose), and statin (switching to Crestor). 2. HTN: controlled. 3. Hyperlipidemia: given his problems  with myalgias and his inability to take daily Pravastatin, I will see if he tolerates Rosuvastatin 5 mg daily. I will see what his lipids were when checked at the [facility]. 4. Fatigue: likely related to sinus bradycardia. Will reduce Metoprolol to 12.5 mg bid.    Prentice Docker, M.D., F.A.C.C.

## 2012-10-07 NOTE — Patient Instructions (Signed)
   Decrease Metoprolol to 25mg  - 1/2 tablet or 12.5mg  twice a day - new sent to pharm  Nitroglycerin as needed for severe chest pain only - refill sent to pharm  Stop Pravastatin  Begin Crestor 5mg  daily - can be taken anytime of the day with or without food - samples, savings script, & new sent to pharm  Continue all other medications.   Your physician wants you to follow up in:  1 year.  You will receive a reminder letter in the mail one-two months in advance.  If you don't receive a letter, please call our office to schedule the follow up appointment

## 2012-10-25 ENCOUNTER — Other Ambulatory Visit: Payer: Self-pay | Admitting: *Deleted

## 2012-10-25 MED ORDER — HYDROCHLOROTHIAZIDE 25 MG PO TABS: 25.0000 mg | ORAL_TABLET | Freq: Every day | ORAL | Status: DC

## 2013-09-20 ENCOUNTER — Encounter: Payer: Self-pay | Admitting: Cardiology

## 2013-10-17 ENCOUNTER — Ambulatory Visit (INDEPENDENT_AMBULATORY_CARE_PROVIDER_SITE_OTHER): Payer: Medicare Other | Admitting: Cardiovascular Disease

## 2013-10-17 ENCOUNTER — Encounter: Payer: Self-pay | Admitting: Cardiovascular Disease

## 2013-10-17 VITALS — BP 153/74 | HR 44 | Ht 73.0 in | Wt 216.0 lb

## 2013-10-17 DIAGNOSIS — I10 Essential (primary) hypertension: Secondary | ICD-10-CM

## 2013-10-17 DIAGNOSIS — Z9114 Patient's other noncompliance with medication regimen: Secondary | ICD-10-CM

## 2013-10-17 DIAGNOSIS — Z789 Other specified health status: Secondary | ICD-10-CM

## 2013-10-17 DIAGNOSIS — R001 Bradycardia, unspecified: Secondary | ICD-10-CM

## 2013-10-17 DIAGNOSIS — I498 Other specified cardiac arrhythmias: Secondary | ICD-10-CM

## 2013-10-17 DIAGNOSIS — Z9119 Patient's noncompliance with other medical treatment and regimen: Secondary | ICD-10-CM

## 2013-10-17 DIAGNOSIS — I2510 Atherosclerotic heart disease of native coronary artery without angina pectoris: Secondary | ICD-10-CM

## 2013-10-17 DIAGNOSIS — E7800 Pure hypercholesterolemia, unspecified: Secondary | ICD-10-CM

## 2013-10-17 DIAGNOSIS — Z888 Allergy status to other drugs, medicaments and biological substances status: Secondary | ICD-10-CM

## 2013-10-17 DIAGNOSIS — Z91199 Patient's noncompliance with other medical treatment and regimen due to unspecified reason: Secondary | ICD-10-CM

## 2013-10-17 MED ORDER — LOVASTATIN 10 MG PO TABS: 10.0000 mg | ORAL_TABLET | Freq: Every day | ORAL | Status: DC

## 2013-10-17 MED ORDER — ASPIRIN EC 81 MG PO TBEC: 81.0000 mg | DELAYED_RELEASE_TABLET | Freq: Every day | ORAL | Status: DC

## 2013-10-17 MED ORDER — METOPROLOL TARTRATE 25 MG PO TABS: 12.5000 mg | ORAL_TABLET | Freq: Two times a day (BID) | ORAL | Status: DC

## 2013-10-17 NOTE — Progress Notes (Signed)
Patient ID: Nicholas Sanchez, male   DOB: 04-14-1933, 80 y.o.   MRN: [id number]      SUBJECTIVE: Mr. Salido has a history of CAD with two stents to the RCA and one to the LAD. The LAD stent was placed in March 2010, which was his most recent cardiac catheterization as well.  He also has essential HTN and hyperlipidemia. He has statin intolerance (myalgias). He had a stress test done on 11/30/2008 which was negative for ischemia, and an echocardiogram which was also reportedly unremarkable. At his last visit, I changed metoprolol tartrate to 12.5 mg twice daily but he decided to take it all at once for feasibility. His heart rate remains in the 40 beat per minute range for this reason. He took the 30 day supply of Crestor I have given him but as it costs $54 per month, he decided not to take it afterwards. He is doing well from a symptomatic standpoint and denies chest pain, leg swelling and shortness of breath. He push mows his lawn without dizziness or lightheadedness nor syncope.    Review of Systems: As per "subjective", otherwise negative.  Allergies  Allergen Reactions  . Ace Inhibitors     Current Outpatient Prescriptions  Medication Sig Dispense Refill  . acetaminophen (TYLENOL) 500 MG tablet Take 500 mg by mouth every 6 (six) hours as needed for pain.      [name] aspirin 325 MG tablet Take 325 mg by mouth daily.        . hydrochlorothiazide (HYDRODIURIL) 25 MG tablet Take 1 tablet (25 mg total) by mouth daily.  90 tablet  3  . metoprolol tartrate (LOPRESSOR) 25 MG tablet Take 25 mg by mouth daily.      . Misc Natural Products (SAW PALMETTO PLUS) CAPS Take 1 capsule by mouth daily.      . nitroGLYCERIN (NITROSTAT) 0.4 MG SL tablet Place 1 tablet (0.4 mg total) under the tongue every 5 (five) minutes as needed for chest pain.  25 tablet  3  . omeprazole (PRILOSEC) 40 MG capsule Take 1 capsule (40 mg total) by mouth daily.  90 capsule  3  . vitamin B-12 (CYANOCOBALAMIN) 1000 MCG tablet  Take 1,000 mcg by mouth daily.       No current facility-administered medications for this visit.    Past Medical History  Diagnosis Date  . History of BPH   . CAD (coronary artery disease)     status post ST elevation myocardial infarction March 2010, status post bare-metal stent x2 to the proximal and distal RCA.  Subsequent stent drug-eluting stent to the LAD in a staged fashion.,  Cardiolite October 2010, negative for ischemia with normal ejection fraction  . GERD (gastroesophageal reflux disease)   . Cough, persistent   . HTN (hypertension)   . Hypercholesteremia     Past Surgical History  Procedure Laterality Date  . Coronary stent placement  04/2008    RCA and LAD    History   Social History  . Marital Status: Single    Spouse Name: N/A    Number of Children: N/A  . Years of Education: N/A   Occupational History  . retired     Press photographer   Social History Main Topics  . Smoking status: Former Smoker -- 2.50 packs/day for 25 years    Types: Cigarettes    Quit date: 06/14/1973  . Smokeless tobacco: Former Systems developer    Types: [place] date: 06/14/1973  Comment: some/chewed very little for about 2 years  . Alcohol Use: No  . Drug Use: No  . Sexual Activity: Not on file   Other Topics Concern  . Not on file   Social History Narrative  . No narrative on file     Filed Vitals:   10/17/13 0813  BP: 153/74  Pulse: 44  Height: 6\' 1"  (1.854 m)  Weight: 216 lb (97.977 kg)    PHYSICAL EXAM General: NAD Neck: No JVD, no thyromegaly. Lungs: Clear to auscultation bilaterally with normal respiratory effort. CV: Nondisplaced PMI. Bradycardic, regular rhythm, normal S1/S2, no S3/S4, no murmur. No pretibial or periankle edema.  No carotid bruit.  Normal pedal pulses.  Abdomen: Soft, nontender, no hepatosplenomegaly, no distention.  Neurologic: Alert and oriented x 3.  Psych: Normal affect. Extremities: No clubbing or cyanosis.   ECG: reviewed and available in  electronic records.      ASSESSMENT AND PLAN: 1. CAD s/p two stents to the RCA and one stent to the LAD: Reduce ASA to 81 mg daily. I reinforced the strategy of taking metoprolol 12.5 mg bid rather than 25 mg q am. I will attempt lovastatin 10 mg daily. 2. Essential HTN: Uncontrolled today, but he says his SBP is normally in the 130 mmHg range. Will monitor for now. 3. Hyperlipidemia: Given his problems with myalgias and his inability to take daily pravastatin, I attempted rosuvastatin 5 mg daily in 09/2012. I will see what his lipids were when checked at the [facility]. He stopped Crestor due to cost. I will try lovastatin 10 mg daily and check lipids and LFT's in 3 months.  Dispo: f/u 1 year.  Kate Sable, M.D., F.A.C.C.

## 2013-10-17 NOTE — Patient Instructions (Signed)
   Decrease Aspirin to 81mg  daily  Change Metoprolol to 12.5mg  twice a day  (1/2 tab of your 25mg  tablet0  Begin Lovastatin 10mg  daily - new sent to pharm Continue all other medications.   Labs for Lipids & Liver function - due in 3 months - will send reminder in mail Your physician wants you to follow up in:  1 year.  You will receive a reminder letter in the mail one-two months in advance.  If you don't receive a letter, please call our office to schedule the follow up appointment

## 2013-11-07 ENCOUNTER — Other Ambulatory Visit: Payer: Self-pay | Admitting: Cardiovascular Disease

## 2014-02-20 ENCOUNTER — Other Ambulatory Visit: Payer: Self-pay | Admitting: *Deleted

## 2014-02-20 ENCOUNTER — Encounter: Payer: Self-pay | Admitting: *Deleted

## 2014-02-20 DIAGNOSIS — I2510 Atherosclerotic heart disease of native coronary artery without angina pectoris: Secondary | ICD-10-CM

## 2014-02-20 DIAGNOSIS — E7800 Pure hypercholesterolemia, unspecified: Secondary | ICD-10-CM

## 2014-03-06 ENCOUNTER — Other Ambulatory Visit: Payer: Self-pay | Admitting: Cardiovascular Disease

## 2014-03-06 DIAGNOSIS — E780 Pure hypercholesterolemia: Secondary | ICD-10-CM | POA: Diagnosis not present

## 2014-03-07 ENCOUNTER — Telehealth: Payer: Self-pay | Admitting: *Deleted

## 2014-03-07 LAB — HEPATIC FUNCTION PANEL
ALBUMIN: 4.2 g/dL (ref 3.5–5.2)
ALK PHOS: 48 U/L (ref 39–117)
ALT: 18 U/L (ref 0–53)
AST: 20 U/L (ref 0–37)
BILIRUBIN INDIRECT: 0.6 mg/dL (ref 0.2–1.2)
Bilirubin, Direct: 0.2 mg/dL (ref 0.0–0.3)
TOTAL PROTEIN: 6.8 g/dL (ref 6.0–8.3)
Total Bilirubin: 0.8 mg/dL (ref 0.2–1.2)

## 2014-03-07 LAB — LIPID PANEL
Cholesterol: 123 mg/dL (ref 0–200)
HDL: 30 mg/dL — ABNORMAL LOW (ref >39)
LDL CALC: 70 mg/dL (ref 0–99)
TRIGLYCERIDES: 117 mg/dL (ref <150)
Total CHOL/HDL Ratio: 4.1 Ratio
VLDL: 23 mg/dL (ref 0–40)

## 2014-03-07 NOTE — Telephone Encounter (Signed)
-----   Message from Herminio Commons, MD sent at 03/07/2014  9:45 AM EST ----- Reasonable.

## 2014-03-07 NOTE — Telephone Encounter (Signed)
Spoke with patient. Gave pt results. All questions answered.

## 2014-05-22 ENCOUNTER — Other Ambulatory Visit: Payer: Self-pay | Admitting: Cardiovascular Disease

## 2014-06-17 ENCOUNTER — Other Ambulatory Visit: Payer: Self-pay | Admitting: Cardiovascular Disease

## 2014-10-17 ENCOUNTER — Ambulatory Visit (INDEPENDENT_AMBULATORY_CARE_PROVIDER_SITE_OTHER): Payer: Medicare Other | Admitting: Cardiovascular Disease

## 2014-10-17 ENCOUNTER — Encounter: Payer: Self-pay | Admitting: Cardiovascular Disease

## 2014-10-17 VITALS — BP 154/72 | HR 58 | Ht 73.0 in | Wt 219.0 lb

## 2014-10-17 DIAGNOSIS — E7800 Pure hypercholesterolemia, unspecified: Secondary | ICD-10-CM

## 2014-10-17 DIAGNOSIS — I10 Essential (primary) hypertension: Secondary | ICD-10-CM | POA: Diagnosis not present

## 2014-10-17 DIAGNOSIS — I2510 Atherosclerotic heart disease of native coronary artery without angina pectoris: Secondary | ICD-10-CM | POA: Diagnosis not present

## 2014-10-17 MED ORDER — NITROGLYCERIN 0.4 MG SL SUBL: 0.4000 mg | SUBLINGUAL_TABLET | SUBLINGUAL | Status: DC | PRN

## 2014-10-17 NOTE — Patient Instructions (Addendum)
   Nitroglycerin refill sent to [facility] today. Continue all other medications.   Your physician wants you to follow up in:  1 year.  You will receive a reminder letter in the mail one-two months in advance.  If you don't receive a letter, please call our office to schedule the follow up appointment

## 2014-10-17 NOTE — Addendum Note (Signed)
Addended by: Laurine Blazer on: 10/17/2014 09:10 AM   Modules accepted: Orders

## 2014-10-17 NOTE — Progress Notes (Signed)
Patient ID: Nicholas Sanchez, male   DOB: 05-Dec-1933, 81 y.o.   MRN: [id number]      SUBJECTIVE: Mr. Gainor has a history of CAD with two stents to the RCA and one to the LAD. The LAD stent was placed in March 2010, which was his most recent cardiac catheterization as well.  He also has essential HTN and hyperlipidemia. He previously had statin intolerance (myalgias) but appears to be tolerating low-dose lovastatin. He had a stress test done on 11/30/2008 which was negative for ischemia, and an echocardiogram which was also reportedly unremarkable.  He denies chest pain and exertional dyspnea. He still push mows his lawn. He said his blood pressure is usually normal and was recently checked at the [facility] and systolic reading was between 130-135.   Review of Systems: As per "subjective", otherwise negative.  Allergies  Allergen Reactions  . Ace Inhibitors     Current Outpatient Prescriptions  Medication Sig Dispense Refill  . acetaminophen (TYLENOL) 500 MG tablet Take 500 mg by mouth every 6 (six) hours as needed for pain.    [name] aspirin EC 81 MG tablet Take 1 tablet (81 mg total) by mouth daily.    . hydrochlorothiazide (HYDRODIURIL) 25 MG tablet TAKE ONE (1) TABLET BY MOUTH EVERY DAY 90 tablet 3  . lovastatin (MEVACOR) 10 MG tablet TAKE ONE TABLET ONCE DAILY 30 tablet 6  . metoprolol tartrate (LOPRESSOR) 25 MG tablet Take 0.5 tablets (12.5 mg total) by mouth 2 (two) times daily.    . Misc Natural Products (SAW PALMETTO PLUS) CAPS Take 1 capsule by mouth daily.    . nitroGLYCERIN (NITROSTAT) 0.4 MG SL tablet Place 1 tablet (0.4 mg total) under the tongue every 5 (five) minutes as needed for chest pain. 25 tablet 3  . omeprazole (PRILOSEC) 40 MG capsule Take 1 capsule (40 mg total) by mouth daily. 90 capsule 3  . vitamin B-12 (CYANOCOBALAMIN) 1000 MCG tablet Take 1,000 mcg by mouth daily.     No current facility-administered medications for this visit.    Past Medical History  Diagnosis  Date  . History of BPH   . CAD (coronary artery disease)     status post ST elevation myocardial infarction March 2010, status post bare-metal stent x2 to the proximal and distal RCA.  Subsequent stent drug-eluting stent to the LAD in a staged fashion.,  Cardiolite October 2010, negative for ischemia with normal ejection fraction  . GERD (gastroesophageal reflux disease)   . Cough, persistent   . HTN (hypertension)   . Hypercholesteremia     Past Surgical History  Procedure Laterality Date  . Coronary stent placement  04/2008    RCA and LAD    Social History   Social History  . Marital Status: Single    Spouse Name: N/A  . Number of Children: N/A  . Years of Education: N/A   Occupational History  . retired     Press photographer   Social History Main Topics  . Smoking status: Former Smoker -- 2.50 packs/day for 25 years    Types: Cigarettes    Quit date: 06/14/1973  . Smokeless tobacco: Former Systems developer    Types: [place] date: 06/14/1973     Comment: some/chewed very little for about 2 years  . Alcohol Use: No  . Drug Use: No  . Sexual Activity: Not on file   Other Topics Concern  . Not on file   Social History Narrative  Filed Vitals:   10/17/14 0808  BP: 154/72  Pulse: 58  Height: '6\' 1"'$  (1.854 m)  Weight: 219 lb (99.338 kg)  SpO2: 98%    PHYSICAL EXAM General: NAD HEENT: Normal. Neck: No JVD, no thyromegaly. Lungs: Clear to auscultation bilaterally with normal respiratory effort. CV: Nondisplaced PMI.  Regular rate and rhythm, normal S1/S2, no S3/S4, no murmur. No pretibial or periankle edema.  No carotid bruit.  Normal pedal pulses.  Abdomen: Soft, nontender, no hepatosplenomegaly, no distention.  Neurologic: Alert and oriented x 3.  Psych: Normal affect. Skin: Normal. Musculoskeletal: Normal range of motion, no gross deformities. Extremities: No clubbing or cyanosis.   ECG: Most recent ECG reviewed.      ASSESSMENT AND PLAN: 1. CAD s/p two stents  to the RCA and one stent to the LAD: Symptomatically stable. Continue ASA, metoprolol, and statin therapy.  2. Essential HTN: Uncontrolled today, but he says his SBP is normally in the 130-135 mmHg range. Will continue to monitor.  3. Hyperlipidemia: Well controlled on 03/06/14. Continue lovastatin 10 mg daily.  Dispo: f/u 1 year.   Kate Sable, M.D., F.A.C.C.

## 2014-10-22 ENCOUNTER — Other Ambulatory Visit: Payer: Self-pay | Admitting: Cardiovascular Disease

## 2014-10-26 ENCOUNTER — Telehealth: Payer: Self-pay | Admitting: Cardiovascular Disease

## 2014-10-26 NOTE — Telephone Encounter (Signed)
Wanting cheaper RX for his   nitroGLYCERIN (NITROSTAT) 0.4 MG SL tablet

## 2014-10-29 MED ORDER — NITROGLYCERIN 0.4 MG SL SUBL: 0.4000 mg | SUBLINGUAL_TABLET | SUBLINGUAL | Status: DC | PRN

## 2014-10-29 NOTE — Telephone Encounter (Signed)
Spoke with patient and informed him that [facility] out of pocket expense for nitroglycerin 0.4 mg is $15.55 vs [facility] price @ $24.00. Patient said he would be able to pay the [place] price. New prescription sent to [facility]. Patient advised to contact [place] so that his demographic information could be updated. Patient verbalized understanding of plan.

## 2014-11-06 ENCOUNTER — Encounter: Payer: Self-pay | Admitting: *Deleted

## 2014-11-06 NOTE — Progress Notes (Signed)
Patient walked into office because he went to [facility] to get the rx that was sent last week for nitroglycerin 0.4 mg tablets and was told that it was $36. Nurse called [facility] regarding this price since it didn't match was told to nurse last week of $15.55. Per [place], nurse was quoted the wrong price for nitroglycerin and it does cost out of pocket expense is $36.00. Nurse called to [facility] and the price was quoted at $26.00 out of pocket expense, [facility] $32, [facility] $36. Nurse advised patient that his pharmacy quoted the best price of $24. Patient said he will get the prescription for [place] if he needed it.

## 2014-11-13 ENCOUNTER — Other Ambulatory Visit: Payer: Self-pay | Admitting: Cardiovascular Disease

## 2014-12-24 ENCOUNTER — Other Ambulatory Visit: Payer: Self-pay | Admitting: *Deleted

## 2014-12-24 MED ORDER — LOVASTATIN 10 MG PO TABS: 10.0000 mg | ORAL_TABLET | Freq: Every day | ORAL | Status: DC

## 2014-12-31 DIAGNOSIS — Z23 Encounter for immunization: Secondary | ICD-10-CM | POA: Diagnosis not present

## 2014-12-31 DIAGNOSIS — L989 Disorder of the skin and subcutaneous tissue, unspecified: Secondary | ICD-10-CM | POA: Diagnosis not present

## 2014-12-31 DIAGNOSIS — R601 Generalized edema: Secondary | ICD-10-CM | POA: Diagnosis not present

## 2014-12-31 DIAGNOSIS — I10 Essential (primary) hypertension: Secondary | ICD-10-CM | POA: Diagnosis not present

## 2014-12-31 DIAGNOSIS — I2510 Atherosclerotic heart disease of native coronary artery without angina pectoris: Secondary | ICD-10-CM | POA: Diagnosis not present

## 2015-01-10 DIAGNOSIS — D225 Melanocytic nevi of trunk: Secondary | ICD-10-CM | POA: Diagnosis not present

## 2015-01-10 DIAGNOSIS — C44310 Basal cell carcinoma of skin of unspecified parts of face: Secondary | ICD-10-CM | POA: Diagnosis not present

## 2015-01-10 DIAGNOSIS — C44319 Basal cell carcinoma of skin of other parts of face: Secondary | ICD-10-CM | POA: Diagnosis not present

## 2015-02-28 DIAGNOSIS — Z85828 Personal history of other malignant neoplasm of skin: Secondary | ICD-10-CM | POA: Diagnosis not present

## 2015-02-28 DIAGNOSIS — Z08 Encounter for follow-up examination after completed treatment for malignant neoplasm: Secondary | ICD-10-CM | POA: Diagnosis not present

## 2015-05-02 DIAGNOSIS — H35352 Cystoid macular degeneration, left eye: Secondary | ICD-10-CM | POA: Diagnosis not present

## 2015-05-27 ENCOUNTER — Other Ambulatory Visit: Payer: Self-pay | Admitting: Cardiovascular Disease

## 2015-06-13 DIAGNOSIS — H35372 Puckering of macula, left eye: Secondary | ICD-10-CM | POA: Diagnosis not present

## 2015-06-13 DIAGNOSIS — H35352 Cystoid macular degeneration, left eye: Secondary | ICD-10-CM | POA: Diagnosis not present

## 2015-08-15 DIAGNOSIS — H35372 Puckering of macula, left eye: Secondary | ICD-10-CM | POA: Diagnosis not present

## 2015-11-13 ENCOUNTER — Other Ambulatory Visit: Payer: Self-pay | Admitting: Cardiovascular Disease

## 2015-11-14 ENCOUNTER — Encounter: Payer: Self-pay | Admitting: *Deleted

## 2015-11-15 ENCOUNTER — Ambulatory Visit (INDEPENDENT_AMBULATORY_CARE_PROVIDER_SITE_OTHER): Payer: Medicare Other | Admitting: Cardiovascular Disease

## 2015-11-15 ENCOUNTER — Encounter: Payer: Self-pay | Admitting: Cardiovascular Disease

## 2015-11-15 VITALS — BP 142/58 | HR 48 | Ht 72.0 in | Wt 220.6 lb

## 2015-11-15 DIAGNOSIS — E7800 Pure hypercholesterolemia, unspecified: Secondary | ICD-10-CM | POA: Diagnosis not present

## 2015-11-15 DIAGNOSIS — I10 Essential (primary) hypertension: Secondary | ICD-10-CM | POA: Diagnosis not present

## 2015-11-15 DIAGNOSIS — I2510 Atherosclerotic heart disease of native coronary artery without angina pectoris: Secondary | ICD-10-CM

## 2015-11-15 DIAGNOSIS — I440 Atrioventricular block, first degree: Secondary | ICD-10-CM

## 2015-11-15 DIAGNOSIS — R001 Bradycardia, unspecified: Secondary | ICD-10-CM

## 2015-11-15 DIAGNOSIS — R55 Syncope and collapse: Secondary | ICD-10-CM

## 2015-11-15 NOTE — Patient Instructions (Addendum)
Medication Instructions:   Stop Metoprolol.  Continue all other medications.    Labwork: none  Testing/Procedures: none  Follow-Up: Your physician wants you to follow up in:  1 year.  You will receive a reminder letter in the mail one-two months in advance.  If you don't receive a letter, please call our office to schedule the follow up appointment.    Any Other Special Instructions Will Be Listed Below (If Applicable). Nurse visit in 2 months for EKG.    If you need a refill on your cardiac medications before your next appointment, please call your pharmacy.

## 2015-11-15 NOTE — Progress Notes (Signed)
SUBJECTIVE: Nicholas Sanchez has a history of CAD with two stents to the RCA and one to the LAD. The LAD stent was placed in March 2010, which was his most recent cardiac catheterization as well.  He also has essential HTN and hyperlipidemia. He previously had statin intolerance (myalgias) but appears to be tolerating low-dose lovastatin. He had a stress test done on 11/30/2008 which was negative for ischemia, and an echocardiogram which was also reportedly unremarkable.  He denies chest pain and exertional dyspnea.   He has had felt lightheaded and dizzy and has almost passed out.  ECG performed in the office today which I personally interpreted demonstrated sinus bradycardia with first-degree AV block, heart rate 47 bpm, PR 388 ms, nonspecific IVCD, nonspecific T wave abnormality.   Review of Systems: As per "subjective", otherwise negative.  Allergies  Allergen Reactions  . Ace Inhibitors     Current Outpatient Prescriptions  Medication Sig Dispense Refill  . acetaminophen (TYLENOL) 500 MG tablet Take 500 mg by mouth every 6 (six) hours as needed for pain.    [name] aspirin EC 81 MG tablet Take 1 tablet (81 mg total) by mouth daily.    . hydrochlorothiazide (HYDRODIURIL) 25 MG tablet TAKE ONE (1) TABLET EACH DAY 90 tablet 0  . lovastatin (MEVACOR) 10 MG tablet Take 1 tablet (10 mg total) by mouth daily. 30 tablet 11  . metoprolol tartrate (LOPRESSOR) 25 MG tablet Take 0.5 tablets (12.5 mg total) by mouth 2 (two) times daily.    . nitroGLYCERIN (NITROSTAT) 0.4 MG SL tablet Place 1 tablet (0.4 mg total) under the tongue every 5 (five) minutes x 3 doses as needed for chest pain. 25 tablet 3  . vitamin B-12 (CYANOCOBALAMIN) 1000 MCG tablet Take 1,000 mcg by mouth daily.     No current facility-administered medications for this visit.     Past Medical History:  Diagnosis Date  . CAD (coronary artery disease)    status post ST elevation myocardial infarction March 2010, status post  bare-metal stent x2 to the proximal and distal RCA.  Subsequent stent drug-eluting stent to the LAD in a staged fashion.,  Cardiolite October 2010, negative for ischemia with normal ejection fraction  . Cough, persistent   . GERD (gastroesophageal reflux disease)   . History of BPH   . HTN (hypertension)   . Hypercholesteremia     Past Surgical History:  Procedure Laterality Date  . CORONARY STENT PLACEMENT  04/2008   RCA and LAD    Social History   Social History  . Marital status: Single    Spouse name: N/A  . Number of children: N/A  . Years of education: N/A   Occupational History  . retired     Press photographer   Social History Main Topics  . Smoking status: Former Smoker    Packs/day: 2.50    Years: 25.00    Types: Cigarettes    Quit date: 06/14/1973  . Smokeless tobacco: Former Systems developer    Types: [place] date: 06/14/1973     Comment: some/chewed very little for about 2 years  . Alcohol use No  . Drug use: No  . Sexual activity: Not on file   Other Topics Concern  . Not on file   Social History Narrative  . No narrative on file     Vitals:   11/15/15 1327  BP: (!) 142/58  Pulse: (!) 48  Weight: 220 lb 9.6 oz (100.1 kg)  Height: 6' (1.829 m)    PHYSICAL EXAM General: NAD HEENT: Normal. Neck: No JVD, no thyromegaly. Lungs: Clear to auscultation bilaterally with normal respiratory effort. CV: Nondisplaced PMI.  Bradycardic, regular rhythm, normal S1/S2, no S3/S4, no murmur. No pretibial or periankle edema.    Abdomen: Soft, nontender, no distention.  Neurologic: Alert and oriented.  Psych: Normal affect. Skin: Normal. Musculoskeletal: No gross deformities.    ECG: Most recent ECG reviewed.      ASSESSMENT AND PLAN: 1. CAD s/p two stents to the RCA and one stent to the LAD: Symptomatically stable. Continue ASA and statin therapy. I will dc metoprolol due to marked sinus bradycardia with 1st degree AV block.  2. Essential HTN: Uncontrolled today,  but he says his SBP is normally in the 130-135 mmHg range. Will continue to monitor.  3. Hyperlipidemia: Continue lovastatin 10 mg daily.  4. Bradycardia/1st degree AV block/near syncope:  I will dc metoprolol due to marked sinus bradycardia with 1st degree AV block.  Dispo: f/u with me in 1 year. Return in 2 months for ECG with nurse.   Kate Sable, M.D., F.A.C.C.

## 2015-12-13 ENCOUNTER — Telehealth: Payer: Self-pay | Admitting: Cardiovascular Disease

## 2015-12-13 DIAGNOSIS — R001 Bradycardia, unspecified: Secondary | ICD-10-CM

## 2015-12-13 NOTE — Telephone Encounter (Signed)
Patient informed and verbalized understanding of plan. 

## 2015-12-13 NOTE — Telephone Encounter (Signed)
Nicholas Sanchez is requesting to speak to someone about the medication of  Metoprolol .

## 2015-12-13 NOTE — Telephone Encounter (Signed)
Needs 30 day event monitor.

## 2015-12-13 NOTE — Telephone Encounter (Signed)
Patient is calling to update on his symptoms since stopping metoprolol that was advised at his last office visit. Patient states, "I continue to have dizziness as if I'm going to faint, and I get flushy all the way to the top of my head." Random BP and HR readings are as follow: BP 151/64 HR 52 & 5 minutes later, BP 128/59 HR 48. Patient reports sob but says its not any different from his last visit. No c/o chest pain.

## 2015-12-24 ENCOUNTER — Telehealth: Payer: Self-pay | Admitting: Cardiovascular Disease

## 2015-12-24 NOTE — Telephone Encounter (Signed)
Pt requesting help to place event monitor says he didn't understand how to put it on - pt will come at 1030 tomorrow for nurse visit - added to schedule

## 2015-12-24 NOTE — Telephone Encounter (Signed)
Patient has question about heart monitor

## 2015-12-25 ENCOUNTER — Other Ambulatory Visit: Payer: Self-pay

## 2015-12-25 ENCOUNTER — Encounter (INDEPENDENT_AMBULATORY_CARE_PROVIDER_SITE_OTHER): Payer: Medicare Other | Admitting: *Deleted

## 2015-12-25 ENCOUNTER — Telehealth: Payer: Self-pay | Admitting: *Deleted

## 2015-12-25 DIAGNOSIS — R001 Bradycardia, unspecified: Secondary | ICD-10-CM

## 2015-12-25 NOTE — Telephone Encounter (Signed)
Left message to return call 

## 2015-12-25 NOTE — Telephone Encounter (Signed)
Patient returned call.  Confirmed that he did stop the Metoprolol as previously suggested.  Patient is in agreement to referral to EP.  Patient reported no symptoms during episode on monitor report below.

## 2015-12-25 NOTE — Progress Notes (Unsigned)
Patient in office for help with placement of his 30 day event monitor.  Monitor placed & instructions (booklet) reviewed with patient.  Patient verbalized understanding & very appreciative of the help.

## 2015-12-25 NOTE — Telephone Encounter (Signed)
-----   Message from Arnoldo Lenis, MD sent at 12/25/2015  2:02 PM EDT ----- Monitor results reviewed. Marked sinus bradycardia with second degree type I block. Please verify patient stopped his beta blocker as previously instructed and refer to EP. Please scan in the critcal report received today.  Zandra Abts MD

## 2015-12-26 ENCOUNTER — Other Ambulatory Visit: Payer: Self-pay

## 2015-12-26 ENCOUNTER — Telehealth: Payer: Self-pay | Admitting: Cardiology

## 2015-12-26 ENCOUNTER — Telehealth: Payer: Self-pay | Admitting: *Deleted

## 2015-12-26 MED ORDER — LOVASTATIN 10 MG PO TABS: 10.0000 mg | ORAL_TABLET | Freq: Every day | ORAL | 11 refills | Status: DC

## 2015-12-26 NOTE — Telephone Encounter (Signed)
-----   Message from Minus Breeding, MD sent at 12/26/2015  4:29 AM EDT ----- This patient again had bradycardia.  Please call him in the AM to make sure that he is OK.  I see that you did that yesterday and that he was to have an appt with EP.  He was set up to see Dr. Marlou Porch in Nov.  Dr. Marlou Porch is not EP.  Please let me know who he will be seeing.

## 2015-12-26 NOTE — Telephone Encounter (Signed)
Called by monitoring.  Patient with another episode of sleeping heart rates in the 30s.  With [place] I reported.  I have not viewed the strips.  This was noted previously and the patient was called.  He had no symptoms with this.  He was not contacted tonight.  We can call him in the morning.  He is to see EP.  However, I don't seen that this has been arranged.

## 2015-12-26 NOTE — Telephone Encounter (Signed)
rx lovastatin refilled

## 2015-12-26 NOTE — Telephone Encounter (Signed)
Call placed to patient this morning, doing fine.  No complaints currently.  Call placed to [facility] office to reschedule appointment as he was scheduled with [facility], instead of an EP doc.  Appointment scheduled for Friday, 01/03/2016 at 10:15 with Dr. Cristopher Peru in [facility] office.  Future follow up could be in [place] office which may be more convenient for patient.  Patient made aware of the appointment.

## 2015-12-30 ENCOUNTER — Encounter: Payer: Self-pay | Admitting: Cardiology

## 2015-12-30 NOTE — Progress Notes (Signed)
Received call from event monitor service that patient had episode of 2nd degree type 2 heart block with rates 20-30 bpm early this AM. Resolved spontaneously to sinus bradycardia with rates >40 bpm Per the service, the patient was contacted and was asymptomatic and sleeping at the time of the event.  Full report will be uploaded shortly. Ordering MD will be notified and I will route this note to the ordering provider.

## 2015-12-31 ENCOUNTER — Other Ambulatory Visit: Payer: Self-pay | Admitting: *Deleted

## 2015-12-31 DIAGNOSIS — R001 Bradycardia, unspecified: Secondary | ICD-10-CM

## 2016-01-02 ENCOUNTER — Encounter: Payer: Self-pay | Admitting: Internal Medicine

## 2016-01-03 ENCOUNTER — Encounter: Payer: Self-pay | Admitting: Internal Medicine

## 2016-01-03 ENCOUNTER — Ambulatory Visit (INDEPENDENT_AMBULATORY_CARE_PROVIDER_SITE_OTHER): Payer: Medicare Other | Admitting: Internal Medicine

## 2016-01-03 VITALS — BP 170/64 | HR 44 | Ht 73.0 in | Wt 222.0 lb

## 2016-01-03 DIAGNOSIS — R001 Bradycardia, unspecified: Secondary | ICD-10-CM | POA: Diagnosis not present

## 2016-01-03 DIAGNOSIS — I25709 Atherosclerosis of coronary artery bypass graft(s), unspecified, with unspecified angina pectoris: Secondary | ICD-10-CM | POA: Diagnosis not present

## 2016-01-03 NOTE — Progress Notes (Signed)
HPI [name] is referred today by Dr. Raliegh Ip for consideration for PPM insertion. He is a pleasant 82 yo man with HTN who has felt severe fatigue and weakness over the past several months. He had his sinus nodal blocking drug stopped when he was discovered to have marked bradycardia. The patient has significant AV conduction system disease. We do not know what his EF is. He denies anginal symptoms. No syncope. No edema. Allergies  Allergen Reactions  . Ace Inhibitors      Current Outpatient Prescriptions  Medication Sig Dispense Refill  . acetaminophen (TYLENOL) 500 MG tablet Take 500 mg by mouth every 6 (six) hours as needed for pain.    [name] aspirin EC 81 MG tablet Take 1 tablet (81 mg total) by mouth daily.    . hydrochlorothiazide (HYDRODIURIL) 25 MG tablet TAKE ONE (1) TABLET EACH DAY 90 tablet 0  . lovastatin (MEVACOR) 10 MG tablet Take 1 tablet (10 mg total) by mouth daily. 30 tablet 11  . nitroGLYCERIN (NITROSTAT) 0.4 MG SL tablet Place 1 tablet (0.4 mg total) under the tongue every 5 (five) minutes x 3 doses as needed for chest pain. 25 tablet 3  . vitamin B-12 (CYANOCOBALAMIN) 1000 MCG tablet Take 1,000 mcg by mouth daily.     No current facility-administered medications for this visit.      Past Medical History:  Diagnosis Date  . CAD (coronary artery disease)    status post ST elevation myocardial infarction March 2010, status post bare-metal stent x2 to the proximal and distal RCA.  Subsequent stent drug-eluting stent to the LAD in a staged fashion.,  Cardiolite October 2010, negative for ischemia with normal ejection fraction  . Cough, persistent   . GERD (gastroesophageal reflux disease)   . History of BPH   . HTN (hypertension)   . Hypercholesteremia     ROS:   All systems reviewed and negative except as noted in the HPI.   Past Surgical History:  Procedure Laterality Date  . CORONARY STENT PLACEMENT  04/2008   RCA and LAD     Family History  Problem  Relation Age of Onset  . Emphysema Mother     mother died with emphysema and cancer  . Cancer Mother      Social History   Social History  . Marital status: Single    Spouse name: N/A  . Number of children: N/A  . Years of education: N/A   Occupational History  . retired     Press photographer   Social History Main Topics  . Smoking status: Former Smoker    Packs/day: 2.50    Years: 25.00    Types: Cigarettes    Quit date: 06/14/1973  . Smokeless tobacco: Former Systems developer    Types: [place] date: 06/14/1973     Comment: some/chewed very little for about 2 years  . Alcohol use No  . Drug use: No  . Sexual activity: Not on file   Other Topics Concern  . Not on file   Social History Narrative  . No narrative on file     BP (!) 170/64   Pulse (!) 44   Ht '6\' 1"'$  (1.854 m)   Wt 222 lb (100.7 kg)   SpO2 97%   BMI 29.29 kg/m   Physical Exam:  Well appearing NAD HEENT: Unremarkable Neck:  No JVD, no thyromegally Lymphatics:  No adenopathy Back:  No CVA tenderness Lungs:  Clear with  no wheezes HEART:  Regular brady rhythm, no murmurs, no rubs, no clicks Abd:  soft, positive bowel sounds, no organomegally, no rebound, no guarding Ext:  2 plus pulses, no edema, no cyanosis, no clubbing Skin:  No rashes no nodules Neuro:  CN II through XII intact, motor grossly intact  EKG - reviewed marked sinus bradycardia with high grade AV block   Assess/Plan: 1. Sinus node dysfunction/AV block  He has both AV block and sinus node dysfunction. I have recommended he undergo PPM insertion. Will check his 2D echo as he has class 2-3 CHF symptoms and I would place a biv PPM if his EF were down. 2. HTN - after his device is placed, I would anticipate that he be placed on a beta blocker 3. CAD - he denies anginal symptoms. No change in meds.  [name]

## 2016-01-03 NOTE — Patient Instructions (Addendum)
Medication Instructions:  Your physician recommends that you continue on your current medications as directed. Please refer to the Current Medication list given to you today.   Labwork: BMET/CBC w/ diff 1 week prior to procedure   Testing/Procedures: Your physician has requested that you have an echocardiogram. Echocardiography is a painless test that uses sound waves to create images of your heart. It provides your doctor with information about the size and shape of your heart and how well your heart's chambers and valves are working. This procedure takes approximately one hour. There are no restrictions for this procedure.---schedule now - 1 week    Your physician has recommended that you have a pacemaker inserted. A pacemaker is a small device that is placed under the skin of your chest or abdomen to help control abnormal heart rhythms. This device uses electrical pulses to prompt the heart to beat at a normal rate. Pacemakers are used to treat heart rhythms that are too slow. Wire (leads) are attached to the pacemaker that goes into the chambers of you heart. This is done in the hospital and usually requires and overnight stay. Please see the instruction sheet given to you today for more information.---Will call patient to schedule after echo results  Possible dates: 11/9, 11/14, 11/16, 11/20, 11/22, 11/30, 12/1, 12/5, 12/8, 12/11, 12/20, 12/29    Follow-Up: Will contact patient after results of echo to schedule device implant  Any Other Special Instructions Will Be Listed Below (If Applicable).     If you need a refill on your cardiac medications before your next appointment, please call your pharmacy.

## 2016-01-04 ENCOUNTER — Telehealth: Payer: Self-pay | Admitting: Student

## 2016-01-04 NOTE — Telephone Encounter (Signed)
  Received a call from the monitoring service that this patient was in 3rd degree HB for approximately 30 seconds this morning with HR in the 30's. Converted to 2nd degree Type 1 AV Block following this. Service will fax strips to the office for review.  Patient asymptomatic with rhythm according to [employer] technician. Will notify Dr. Lovena Le as he just saw the patient yesterday and recommended PPM insertion.  Signed, [name] Heritage, PA-C 01/04/2016, 1:24 PM

## 2016-01-05 ENCOUNTER — Telehealth: Payer: Self-pay | Admitting: Cardiovascular Disease

## 2016-01-05 NOTE — Progress Notes (Signed)
Got a call from [place]. Patients HR was noted to drop into the 30's with evidence of 3rd degree of AVB. Also juncitonal escape beats. 2 episodes were sent over each 2 min in length. I contacted the pt. He reports to have been in bed sleeping. No new symptoms. He had an uneventful day, no new nodal blocking agents. I asked him to check his BP and pulse with his machine at home. His BP was 150/70 with HR of 44. That's what he had in office on Friday. He is currently undergoing PM eval.   I explained him what was seen on the remote monitoring, He will watch out for new symptoms and contact office on Monday. He will call back if new symptoms occur.  Cristina Gong, MD

## 2016-01-06 ENCOUNTER — Telehealth: Payer: Self-pay | Admitting: *Deleted

## 2016-01-06 ENCOUNTER — Telehealth: Payer: Self-pay | Admitting: Physician Assistant

## 2016-01-06 DIAGNOSIS — Z01812 Encounter for preprocedural laboratory examination: Secondary | ICD-10-CM

## 2016-01-06 NOTE — Telephone Encounter (Addendum)
Paged by [employer] wit [employer], patient's heart rate dropped to 30 bpm while he was sitting in a chair. He had brief 3rd degree AV block. Apparently, this has been happening for a few times in the past few days. Surprisingly he has been asymptomatic. He will have echo tomorrow and consider for PPM by Dr. Lovena Le.   I have advised the patient to go to hospital if he become symptomatic with severe dizziness or presyncope or syncope. If that does happen, he will need to be admitted to the hospital for [facility].  Signed, Almyra Deforest PA Pager: [phone]

## 2016-01-06 NOTE — Telephone Encounter (Signed)
Received call from [employer] regarding bradycardia 30's - phone notes from 11/4 also says this - pt c/o spells of being dizzy/lightheaded several times daily since Saturday night - denies SOB and chest pain - doesn't feel like he needs to report to ED - will forward to covering provider for Dr. Bronson Ing - pt says will have wife take him to ED if symptoms worsen

## 2016-01-06 NOTE — Telephone Encounter (Signed)
Called pt. Pt stated he was having symptoms (dizziness/lightheadedness) with the bradycardia, but the symptoms are not new. Pt has had these symptoms before. Pt denies SOB or CP. Will forward to Dr. Lovena Le to advise.

## 2016-01-06 NOTE — Telephone Encounter (Signed)
Called , spoke with pt. Moved echo appt to tomorrow at 7:30 AM, arriving at 7:00 AM, [facility] location. After results of echo, we will contact pt to schedule PPM. Pt verbalized understanding of information and thanked me for calling.

## 2016-01-06 NOTE — Telephone Encounter (Signed)
Dr. Lovena Le can you advise? You recommended pacer on Friday and this is the second note sent from monitor company. Do you want patient to go to [facility] ER?

## 2016-01-07 ENCOUNTER — Other Ambulatory Visit: Payer: Medicare Other | Admitting: *Deleted

## 2016-01-07 ENCOUNTER — Other Ambulatory Visit: Payer: Self-pay

## 2016-01-07 ENCOUNTER — Telehealth: Payer: Self-pay | Admitting: Internal Medicine

## 2016-01-07 ENCOUNTER — Ambulatory Visit (HOSPITAL_BASED_OUTPATIENT_CLINIC_OR_DEPARTMENT_OTHER): Payer: Medicare Other

## 2016-01-07 DIAGNOSIS — I071 Rheumatic tricuspid insufficiency: Secondary | ICD-10-CM

## 2016-01-07 DIAGNOSIS — I371 Nonrheumatic pulmonary valve insufficiency: Secondary | ICD-10-CM | POA: Insufficient documentation

## 2016-01-07 DIAGNOSIS — I25709 Atherosclerosis of coronary artery bypass graft(s), unspecified, with unspecified angina pectoris: Secondary | ICD-10-CM

## 2016-01-07 DIAGNOSIS — I10 Essential (primary) hypertension: Secondary | ICD-10-CM

## 2016-01-07 DIAGNOSIS — R001 Bradycardia, unspecified: Secondary | ICD-10-CM | POA: Diagnosis not present

## 2016-01-07 DIAGNOSIS — I2510 Atherosclerotic heart disease of native coronary artery without angina pectoris: Secondary | ICD-10-CM

## 2016-01-07 DIAGNOSIS — I340 Nonrheumatic mitral (valve) insufficiency: Secondary | ICD-10-CM

## 2016-01-07 DIAGNOSIS — I351 Nonrheumatic aortic (valve) insufficiency: Secondary | ICD-10-CM | POA: Insufficient documentation

## 2016-01-07 DIAGNOSIS — I495 Sick sinus syndrome: Secondary | ICD-10-CM | POA: Diagnosis not present

## 2016-01-07 LAB — CBC WITH DIFFERENTIAL/PLATELET
BASOS ABS: 0 {cells}/uL (ref 0–200)
Basophils Relative: 0 %
EOS PCT: 1 %
Eosinophils Absolute: 69 cells/uL (ref 15–500)
HCT: 41 % (ref 38.5–50.0)
HEMOGLOBIN: 13.7 g/dL (ref 13.2–17.1)
LYMPHS PCT: 20 %
Lymphs Abs: 1380 cells/uL (ref 850–3900)
MCH: 31.9 pg (ref 27.0–33.0)
MCHC: 33.4 g/dL (ref 32.0–36.0)
MCV: 95.3 fL (ref 80.0–100.0)
MONOS PCT: 9 %
MPV: 10.1 fL (ref 7.5–12.5)
Monocytes Absolute: 621 cells/uL (ref 200–950)
NEUTROS PCT: 70 %
Neutro Abs: 4830 cells/uL (ref 1500–7800)
PLATELETS: 149 10*3/uL (ref 140–400)
RBC: 4.3 MIL/uL (ref 4.20–5.80)
RDW: 14.1 % (ref 11.0–15.0)
WBC: 6.9 10*3/uL (ref 3.8–10.8)

## 2016-01-07 LAB — BASIC METABOLIC PANEL
BUN: 11 mg/dL (ref 7–25)
CALCIUM: 9.2 mg/dL (ref 8.6–10.3)
CO2: 29 mmol/L (ref 20–31)
CREATININE: 1.16 mg/dL — AB (ref 0.70–1.11)
Chloride: 100 mmol/L (ref 98–110)
GLUCOSE: 116 mg/dL — AB (ref 65–99)
Potassium: 4 mmol/L (ref 3.5–5.3)
Sodium: 135 mmol/L (ref 135–146)

## 2016-01-07 MED ORDER — PERFLUTREN LIPID MICROSPHERE
1.0000 mL | INTRAVENOUS | Status: AC | PRN
  Administered 2016-01-07: 2 mL via INTRAVENOUS

## 2016-01-07 NOTE — Telephone Encounter (Signed)
(  omit previous note at 2:55 PM)   Called, spoke with pt.   Follow-up for a wound check in the [facility] 10-14 days from 01/09/16 and with Dr. Lovena Le 91 days from 01/09/16.[name]  Pt informed to wash with the CHG Soap the night before and morning of procedure (follow instruction page "Preparing For Surgery").   Report to the [place] [facility] on 01/09/16 at 5:30 AM  Nothing to eat or drink after midnight the night before procedure  Do not take any medication day of procedure.   Plan 1 night stay  Pt verbalized understanding of information.

## 2016-01-07 NOTE — Progress Notes (Signed)
Bmp

## 2016-01-07 NOTE — Telephone Encounter (Signed)
New message  Pt returning call   Please call again

## 2016-01-08 ENCOUNTER — Encounter (HOSPITAL_COMMUNITY): Payer: Self-pay | Admitting: Emergency Medicine

## 2016-01-08 ENCOUNTER — Inpatient Hospital Stay (HOSPITAL_COMMUNITY)
Admission: EM | Admit: 2016-01-08 | Discharge: 2016-01-10 | DRG: 243 | Disposition: A | Discharge status: Home / Self Care | Payer: Medicare Other | Attending: Cardiology | Admitting: Cardiology

## 2016-01-08 ENCOUNTER — Emergency Department (HOSPITAL_COMMUNITY): Payer: Medicare Other

## 2016-01-08 DIAGNOSIS — I442 Atrioventricular block, complete: Secondary | ICD-10-CM | POA: Diagnosis present

## 2016-01-08 DIAGNOSIS — E785 Hyperlipidemia, unspecified: Secondary | ICD-10-CM | POA: Diagnosis present

## 2016-01-08 DIAGNOSIS — K219 Gastro-esophageal reflux disease without esophagitis: Secondary | ICD-10-CM | POA: Diagnosis present

## 2016-01-08 DIAGNOSIS — Z7982 Long term (current) use of aspirin: Secondary | ICD-10-CM | POA: Diagnosis not present

## 2016-01-08 DIAGNOSIS — I495 Sick sinus syndrome: Principal | ICD-10-CM | POA: Diagnosis present

## 2016-01-08 DIAGNOSIS — I10 Essential (primary) hypertension: Secondary | ICD-10-CM | POA: Diagnosis present

## 2016-01-08 DIAGNOSIS — I25119 Atherosclerotic heart disease of native coronary artery with unspecified angina pectoris: Secondary | ICD-10-CM | POA: Diagnosis present

## 2016-01-08 DIAGNOSIS — Z91048 Other nonmedicinal substance allergy status: Secondary | ICD-10-CM | POA: Diagnosis not present

## 2016-01-08 DIAGNOSIS — I252 Old myocardial infarction: Secondary | ICD-10-CM

## 2016-01-08 DIAGNOSIS — Z955 Presence of coronary angioplasty implant and graft: Secondary | ICD-10-CM | POA: Diagnosis not present

## 2016-01-08 DIAGNOSIS — Z950 Presence of cardiac pacemaker: Secondary | ICD-10-CM

## 2016-01-08 DIAGNOSIS — N400 Enlarged prostate without lower urinary tract symptoms: Secondary | ICD-10-CM | POA: Diagnosis present

## 2016-01-08 DIAGNOSIS — Z79899 Other long term (current) drug therapy: Secondary | ICD-10-CM | POA: Diagnosis not present

## 2016-01-08 DIAGNOSIS — Z87891 Personal history of nicotine dependence: Secondary | ICD-10-CM | POA: Diagnosis not present

## 2016-01-08 DIAGNOSIS — I255 Ischemic cardiomyopathy: Secondary | ICD-10-CM | POA: Diagnosis present

## 2016-01-08 DIAGNOSIS — Z888 Allergy status to other drugs, medicaments and biological substances status: Secondary | ICD-10-CM

## 2016-01-08 DIAGNOSIS — R001 Bradycardia, unspecified: Secondary | ICD-10-CM

## 2016-01-08 DIAGNOSIS — R200 Anesthesia of skin: Secondary | ICD-10-CM

## 2016-01-08 DIAGNOSIS — I459 Conduction disorder, unspecified: Secondary | ICD-10-CM | POA: Diagnosis present

## 2016-01-08 DIAGNOSIS — I5022 Chronic systolic (congestive) heart failure: Secondary | ICD-10-CM | POA: Diagnosis not present

## 2016-01-08 LAB — CBC
HCT: 41.2 % (ref 39.0–52.0)
Hemoglobin: 14 g/dL (ref 13.0–17.0)
MCH: 31.7 pg (ref 26.0–34.0)
MCHC: 34 g/dL (ref 30.0–36.0)
MCV: 93.2 fL (ref 78.0–100.0)
PLATELETS: 141 10*3/uL — AB (ref 150–400)
RBC: 4.42 MIL/uL (ref 4.22–5.81)
RDW: 13.4 % (ref 11.5–15.5)
WBC: 7.3 10*3/uL (ref 4.0–10.5)

## 2016-01-08 LAB — PROTIME-INR
INR: 1.07
PROTHROMBIN TIME: 14 s (ref 11.4–15.2)

## 2016-01-08 LAB — BASIC METABOLIC PANEL
Anion gap: 7 (ref 5–15)
BUN: 10 mg/dL (ref 6–20)
CALCIUM: 9.2 mg/dL (ref 8.9–10.3)
CHLORIDE: 101 mmol/L (ref 101–111)
CO2: 26 mmol/L (ref 22–32)
CREATININE: 1.17 mg/dL (ref 0.61–1.24)
GFR calc non Af Amer: 57 mL/min — ABNORMAL LOW (ref >60)
Glucose, Bld: 89 mg/dL (ref 65–99)
Potassium: 4.2 mmol/L (ref 3.5–5.1)
SODIUM: 134 mmol/L — AB (ref 135–145)

## 2016-01-08 LAB — I-STAT TROPONIN, ED: TROPONIN I, POC: 0.03 ng/mL (ref 0.00–0.08)

## 2016-01-08 LAB — TROPONIN I: TROPONIN I: 0.05 ng/mL — AB (ref <0.03)

## 2016-01-08 MED ORDER — ATROPINE SULFATE 1 MG/10ML IJ SOSY
PREFILLED_SYRINGE | INTRAMUSCULAR | Status: AC
  Filled 2016-01-08: qty 10

## 2016-01-08 MED ORDER — HYDROCHLOROTHIAZIDE 25 MG PO TABS
25.0000 mg | ORAL_TABLET | Freq: Every day | ORAL | Status: DC
Start: 2016-01-08 — End: 2016-01-10
  Administered 2016-01-08 – 2016-01-10 (×2): 25 mg via ORAL
  Filled 2016-01-08 (×2): qty 1

## 2016-01-08 MED ORDER — PANTOPRAZOLE SODIUM 40 MG PO TBEC
40.0000 mg | DELAYED_RELEASE_TABLET | Freq: Every day | ORAL | Status: DC
  Administered 2016-01-08 – 2016-01-10 (×2): 40 mg via ORAL
  Filled 2016-01-08 (×2): qty 1

## 2016-01-08 MED ORDER — ACETAMINOPHEN 325 MG PO TABS: 650.0000 mg | ORAL_TABLET | ORAL | Status: DC | PRN

## 2016-01-08 MED ORDER — SODIUM CHLORIDE 0.9 % IV SOLN: 250.0000 mL | INTRAVENOUS | Status: DC

## 2016-01-08 MED ORDER — SODIUM CHLORIDE 0.9 % IR SOLN
80.0000 mg | Status: AC
  Administered 2016-01-09: 80 mg

## 2016-01-08 MED ORDER — PROPYLENE GLYCOL 0.6 % OP SOLN
1.0000 [drp] | Freq: Four times a day (QID) | OPHTHALMIC | Status: DC
  Filled 2016-01-08 (×2): qty 1

## 2016-01-08 MED ORDER — PRAVASTATIN SODIUM 20 MG PO TABS
10.0000 mg | ORAL_TABLET | Freq: Every day | ORAL | Status: DC
  Filled 2016-01-08: qty 1

## 2016-01-08 MED ORDER — CHLORHEXIDINE GLUCONATE 4 % EX LIQD
60.0000 mL | Freq: Once | CUTANEOUS | Status: AC
  Administered 2016-01-08: 4 via TOPICAL

## 2016-01-08 MED ORDER — SODIUM CHLORIDE 0.9 % IV SOLN: INTRAVENOUS | Status: DC

## 2016-01-08 MED ORDER — ONDANSETRON HCL 4 MG/2ML IJ SOLN: 4.0000 mg | Freq: Four times a day (QID) | INTRAMUSCULAR | Status: DC | PRN

## 2016-01-08 MED ORDER — DORZOLAMIDE HCL-TIMOLOL MAL PF 22.3-6.8 MG/ML OP SOLN: 1.0000 [drp] | Freq: Two times a day (BID) | OPHTHALMIC | Status: DC

## 2016-01-08 MED ORDER — LATANOPROST 0.005 % OP SOLN
1.0000 [drp] | Freq: Every day | OPHTHALMIC | Status: DC
  Administered 2016-01-08 – 2016-01-09 (×2): 1 [drp] via OPHTHALMIC
  Filled 2016-01-08: qty 2.5

## 2016-01-08 MED ORDER — CHLORHEXIDINE GLUCONATE 4 % EX LIQD
60.0000 mL | Freq: Once | CUTANEOUS | Status: AC
  Administered 2016-01-09: 4 via TOPICAL

## 2016-01-08 MED ORDER — ASPIRIN EC 81 MG PO TBEC
81.0000 mg | DELAYED_RELEASE_TABLET | Freq: Every day | ORAL | Status: DC
  Filled 2016-01-08: qty 1

## 2016-01-08 MED ORDER — SODIUM CHLORIDE 0.9% FLUSH: 3.0000 mL | INTRAVENOUS | Status: DC | PRN

## 2016-01-08 MED ORDER — POLYVINYL ALCOHOL 1.4 % OP SOLN
1.0000 [drp] | Freq: Four times a day (QID) | OPHTHALMIC | Status: DC
  Administered 2016-01-09 – 2016-01-10 (×3): 1 [drp] via OPHTHALMIC
  Filled 2016-01-08: qty 15

## 2016-01-08 MED ORDER — CEFAZOLIN SODIUM-DEXTROSE 2-4 GM/100ML-% IV SOLN
2.0000 g | INTRAVENOUS | Status: AC
  Administered 2016-01-09: 2 g via INTRAVENOUS

## 2016-01-08 MED ORDER — SODIUM CHLORIDE 0.9% FLUSH: 3.0000 mL | Freq: Two times a day (BID) | INTRAVENOUS | Status: DC

## 2016-01-08 MED ORDER — NITROGLYCERIN 0.4 MG SL SUBL: 0.4000 mg | SUBLINGUAL_TABLET | SUBLINGUAL | Status: DC | PRN

## 2016-01-08 MED ORDER — ASPIRIN EC 81 MG PO TBEC: 81.0000 mg | DELAYED_RELEASE_TABLET | Freq: Every day | ORAL | Status: DC

## 2016-01-08 NOTE — ED Notes (Signed)
Report attempted 

## 2016-01-08 NOTE — ED Notes (Signed)
Cardiology at bedside.

## 2016-01-08 NOTE — H&P (Addendum)
H&P    Patient ID: Nicholas Sanchez MRN: [id number], DOB/AGE: November 14, 1933 82 y.o.  Admit date: 01/08/2016 Date of admit 01/08/2016  Primary Physician: Alonza Bogus, MD Primary Cardiologist: Dr. Bronson Ing Electrophysiologist: Dr. Lovena Le  Reason for Admission: Complete AV block, marked bradycardia  HPI: Nicholas Sanchez is a 82 y.o. male with PMHx of CAD with two stents to the RCA and one to the LAD. The LAD stent was placed in March 2010, which was his most recent cardiac catheterization as well, only able to tolerate low dose statins. HTN, HLD, and known sinus node dysfunction as well as AVblock on the schedule tomorrow for CRT-Pacer with Dr. Lovena Le.  He comes to the ER via EMS today, reports earlier he was feeling well, went to the BR, had a BM, and upon walking out of the BR to the den felt like left arm discomfort, perhaps numb with some left facial discomfort/tingling as well, he states he was concerned because this is similar to his heart pain at the time of his last stent except he had no CP this time, palpitations or SOB, this lasted about 5 minutes unchanged with position or ambulation, felt like his legs were heavy b/l from the knees down and felt like his feet were numb, perhaps R>L, and weak in general.  He took/chewed an ASA and called 911.  When they arrived and got him settled in the ambulance, he noted all of these symptoms had resolved, and remain resolved.  He did not have near syncope or syncope.  He last say Dr. Jacinta Shoe in September at that time his BB was stopped secondary to bradycardia, otherwise doing well.  He saw Dr. Lovena Le last [place], who noted sinus node dysfunction and high  AV block despite off his BB with c/o fatigue and weakness felt secondary to his bradycardia and planned for echo and PPM.  He remains completely asymptomatic in the ER at rest, BP stable with SBP 150-170 range, HR transiently has been observed high 20's- generally 30's-50's  LABS: K+  4.2 BUN/Creat 10/1.17 H/H 14/41 WBC 7.3 plts 141 poc Trop 0.03  cxr focal area of infiltrate ?suspicious for RML early pneumonia       (no symptoms,afebrile, exam is negative)  Past Medical History:  Diagnosis Date  . CAD (coronary artery disease)    status post ST elevation myocardial infarction March 2010, status post bare-metal stent x2 to the proximal and distal RCA.  Subsequent stent drug-eluting stent to the LAD in a staged fashion.,  Cardiolite October 2010, negative for ischemia with normal ejection fraction  . Cough, persistent   . GERD (gastroesophageal reflux disease)   . History of BPH   . HTN (hypertension)   . Hypercholesteremia      Surgical History:  Past Surgical History:  Procedure Laterality Date  . CORONARY STENT PLACEMENT  04/2008   RCA and LAD      (Not in a hospital admission)  Inpatient Medications:   Allergies:  Allergies  Allergen Reactions  . Ace Inhibitors Nausea Only and Other (See Comments)  . Tape Rash    And Bandaids, too    Social History   Social History  . Marital status: Single    Spouse name: N/A  . Number of children: N/A  . Years of education: N/A   Occupational History  . retired     Press photographer   Social History Main Topics  . Smoking status: Former Smoker    Packs/day: 2.50  Years: 25.00    Types: Cigarettes    Quit date: 06/14/1973  . Smokeless tobacco: Former Systems developer    Types: [place] date: 06/14/1973     Comment: some/chewed very little for about 2 years  . Alcohol use No  . Drug use: No  . Sexual activity: Not on file   Other Topics Concern  . Not on file   Social History Narrative  . No narrative on file     Family History  Problem Relation Age of Onset  . Emphysema Mother     mother died with emphysema and cancer  . Cancer Mother      Review of Systems: All other systems reviewed and are otherwise negative except as noted above.  Physical Exam: Vitals:   01/08/16 1500 01/08/16 1553  01/08/16 1658 01/08/16 1700  BP: 170/61 170/61 154/68 144/62  Pulse: (!) 50 61 (!) 46 (!) 37  Resp: '19 16 17 12  '$ Temp:      TempSrc:      SpO2: 98% 98% 99% 99%    GEN- The patient is well appearing, alert and oriented x 3 today.   HEENT: normocephalic, atraumatic; sclera clear, conjunctiva pink; hearing intact; oropharynx clear; neck supple, no JVP Lymph- no cervical lymphadenopathy Lungs- Clear to ausculation bilaterally, normal work of breathing.  No wheezes, rales, rhonchi Heart- Regular rate and rhythm, bradycardic, no murmurs, rubs or gallops, PMI not laterally displaced GI- soft, non-tender, non-distended Extremities- no clubbing, cyanosis, or edema MS- no significant deformity or atrophy Skin- warm and dry, no rash or lesion Psych- euthymic mood, full affect Neuro- no gross deficits observed  Labs:   Lab Results  Component Value Date   WBC 7.3 01/08/2016   HGB 14.0 01/08/2016   HCT 41.2 01/08/2016   MCV 93.2 01/08/2016   PLT 141 (L) 01/08/2016     Recent Labs Lab 01/08/16 1523  NA 134*  K 4.2  CL 101  CO2 26  BUN 10  CREATININE 1.17  CALCIUM 9.2  GLUCOSE 89      Radiology/Studies:  Dg Chest Port 1 View Result Date: 01/08/2016 CLINICAL DATA:  Left upper extremity numbness.  Hypertension. EXAM: PORTABLE CHEST 1 VIEW COMPARISON:  January 13, 2010 FINDINGS: There is slight increased opacity in the right mid lung laterally. Lungs elsewhere clear. Heart is borderline enlarged with pulmonary vascularity within normal limits. There is atherosclerotic calcification aorta. No adenopathy. No bone lesions. There is calcification in the carotid arteries bilaterally. IMPRESSION: Focal area of infiltrate right mid lung suspicious for early pneumonia. Lungs elsewhere clear. Heart mildly prominent with pulmonary vascularity within normal limits. There is aortic atherosclerosis as well as bilateral carotid artery calcification. Electronically Signed   By: Lowella Grip III  M.D.   On: 01/08/2016 15:23    EKG: 1st degree AVblock,  Mobitz one AV block, 42bpm TELEMETRY: SB 50's at times, as well as 2:1 conduction with rates 30's, transiently high 20's 01/07/16: TTE Study Conclusions - Left ventricle: The cavity size was mildly dilated. There was   severe focal basal and mild concentric hypertrophy. Systolic   function was mildly reduced. The estimated ejection fraction was   in the range of 45% to 50%. Diffuse hypokinesis. Features are   consistent with a pseudonormal left ventricular filling pattern,   with concomitant abnormal relaxation and increased filling   pressure (grade 2 diastolic dysfunction). - Aortic valve: Trileaflet; normal thickness, mildly calcified   leaflets. There was mild regurgitation. -  Aorta: Aortic root dimension: 39 mm (ED). - Aortic root: The aortic root was mildly dilated. - Mitral valve: Moderately calcified annulus. There was trivial   regurgitation. - Left atrium: The atrium was severely dilated. - Right ventricle: The cavity size was mildly dilated. Wall   thickness was normal. - Pulmonic valve: There was trivial regurgitation. - Pulmonary arteries: PA peak pressure: 35 mm Hg (S). Impressions: - The right ventricular systolic pressure was increased consistent   with mild pulmonary hypertension. 12/2015: Event monitor Notes SR,1st degree AVBlock,  And 2:1 AVblock rates 30bpm range  Assessment and Plan:  Discussed with Dr. Lovena Le, he is aware of the admission  1. L arm/jaw/face discomfort/numbness     CAD hx     Remain resolved     poc trop negative     Dorreen Valiente cycle troponins  2. Known sinus and complete AV block and bradycardia     scheduled for CRT-P in the AM with Dr. Lovena Le     Ruwayda Curet admit to step down  3. HTN     Continue home meds    Signed, Tommye Standard, PA-C 01/08/2016 5:20 PM   I have seen and examined this patient with Tommye Standard.  Agree with above, note added to reflect my findings.  On exam,  irregular rhythm, bradycardic, no murmurs, lungs clear. Presented to the hospital with left arm numbness, currently ruling out for cardiac etiology.  Found to have heart block and sinus node dysfunction with planned pacemaker tomorrow.  Angelo Caroll continue to check troponin overnight to rule out cardiac ischemia and if none, plan for CRT-P tomorrow.    Tenoch Mcclure M. Riyanshi Wahab MD 01/08/2016 6:00 PM

## 2016-01-08 NOTE — ED Triage Notes (Signed)
Pt from home via [place] with c/o left arm numbness starting around 1300 today.  Pt reports the same in the past with MI.  Pt is to have a pace maker placed here at [facility] in the morning.  Pt states numbness has resolved at this time.  Took 324 mg asprin prior to EMS arrival.  NAD, A&O, ambulatory.

## 2016-01-08 NOTE — ED Provider Notes (Signed)
[facility] DEPT Provider Note   CSN: [id number] Arrival date & time: 01/08/16  1428     History   Chief Complaint Chief Complaint  Patient presents with  . Numbness    HPI LELAN CUSH is a 82 y.o. male.  HPI   Patient is a 82 year old male presenting with numbness tingling to his left arm. Patient has history of CAD and bradycardia. Bradycardia used to be asymptomatic, has become symptomatic recently and therefore his pacemaker insertion has been moved up till tomorrow morning.  Patient today was using the restroom and developed left arm numbness tingling similar to his last cardiac event. Patient took 325 aspirin and called EMS.  On arrival he has no left arm pain, chest pain and is bradycardic to the 40s.  Past Medical History:  Diagnosis Date  . CAD (coronary artery disease)    status post ST elevation myocardial infarction March 2010, status post bare-metal stent x2 to the proximal and distal RCA.  Subsequent stent drug-eluting stent to the LAD in a staged fashion.,  Cardiolite October 2010, negative for ischemia with normal ejection fraction  . Cough, persistent   . GERD (gastroesophageal reflux disease)   . History of BPH   . HTN (hypertension)   . Hypercholesteremia     Patient Active Problem List   Diagnosis Date Noted  . Heart block 01/08/2016  . CAD (coronary artery disease)   . HTN (hypertension)   . Hypercholesteremia   . GERD (gastroesophageal reflux disease)     Past Surgical History:  Procedure Laterality Date  . CORONARY STENT PLACEMENT  04/2008   RCA and LAD       Home Medications    Prior to Admission medications   Medication Sig Start Date End Date Taking? Authorizing Provider  acetaminophen (TYLENOL) 500 MG tablet Take 500 mg by mouth every 6 (six) hours as needed for pain.   Yes Historical Provider, MD  aspirin EC 81 MG tablet Take 1 tablet (81 mg total) by mouth daily. 10/17/13  Yes Herminio Commons, MD  Dorzolamide  HCl-Timolol Mal PF 22.3-6.8 MG/ML SOLN Place 1 drop into the left eye 2 (two) times daily.    Yes Historical Provider, MD  hydrochlorothiazide (HYDRODIURIL) 25 MG tablet TAKE ONE (1) TABLET EACH DAY Patient taking differently: Take 25 mg by mouth once a day 11/13/15  Yes Herminio Commons, MD  latanoprost (XALATAN) 0.005 % ophthalmic solution Place 1 drop into both eyes at bedtime.   Yes Historical Provider, MD  lovastatin (MEVACOR) 10 MG tablet Take 1 tablet (10 mg total) by mouth daily. 12/26/15  Yes Herminio Commons, MD  nitroGLYCERIN (NITROSTAT) 0.4 MG SL tablet Place 1 tablet (0.4 mg total) under the tongue every 5 (five) minutes x 3 doses as needed for chest pain. 10/29/14  Yes Herminio Commons, MD  pantoprazole (PROTONIX) 40 MG tablet Take 40 mg by mouth daily.   Yes Historical Provider, MD  Propylene Glycol (SYSTANE BALANCE OP) Place 1 drop into both eyes 4 (four) times daily.    Yes Historical Provider, MD  vitamin B-12 (CYANOCOBALAMIN) 1000 MCG tablet Take 1,000 mcg by mouth daily.   Yes Historical Provider, MD    Family History Family History  Problem Relation Age of Onset  . Emphysema Mother     mother died with emphysema and cancer  . Cancer Mother     Social History Social History  Substance Use Topics  . Smoking status: Former Smoker  Packs/day: 2.50    Years: 25.00    Types: Cigarettes    Quit date: 06/14/1973  . Smokeless tobacco: Former Systems developer    Types: [place] date: 06/14/1973     Comment: some/chewed very little for about 2 years  . Alcohol use No     Allergies   Ace inhibitors and Tape   Review of Systems Review of Systems  Constitutional: Negative for fatigue and fever.  Respiratory: Negative for shortness of breath.   Cardiovascular: Positive for chest pain.  Genitourinary: Negative for flank pain.  All other systems reviewed and are negative.    Physical Exam Updated Vital Signs BP 170/61   Pulse 61   Temp 97.5 F (36.4 C) (Oral)    Resp 16   SpO2 98%   Physical Exam  Constitutional: He is oriented to person, place, and time. He appears well-nourished.  HENT:  Head: Normocephalic.  Mouth/Throat: Oropharynx is clear and moist.  Eyes: Conjunctivae are normal.  Neck: No tracheal deviation present.  Cardiovascular:  Bradycardia  Pulmonary/Chest: Effort normal. No stridor. No respiratory distress.  Musculoskeletal: Normal range of motion. He exhibits no edema.  Neurological: He is oriented to person, place, and time. No cranial nerve deficit.  Skin: Skin is warm and dry. No rash noted. He is not diaphoretic.  Psychiatric: He has a normal mood and affect. His behavior is normal.  Nursing note and vitals reviewed.    ED Treatments / Results  Labs (all labs ordered are listed, but only abnormal results are displayed) Labs Reviewed  BASIC METABOLIC PANEL - Abnormal; Notable for the following:       Result Value   Sodium 134 (*)    GFR calc non Af Amer 57 (*)    All other components within normal limits  CBC - Abnormal; Notable for the following:    Platelets 141 (*)    All other components within normal limits  PROTIME-INR  I-STAT TROPOININ, ED    EKG  EKG Interpretation  Date/Time:  Wednesday January 08 2016 14:43:34 EST Ventricular Rate:  42 PR Interval:    QRS Duration: 130 QT Interval:  481 QTC Calculation: 402 R Axis:   98 Text Interpretation:  Bradycardia with irregular rate Prolonged PR interval Consider left ventricular hypertrophy Nonspecific T abnormalities, inferior leads 3rd degree heart block. Confirmed by Gerald Leitz ([id number]) on 01/08/2016 3:03:41 PM       Radiology Dg Chest Port 1 View  Result Date: 01/08/2016 CLINICAL DATA:  Left upper extremity numbness.  Hypertension. EXAM: PORTABLE CHEST 1 VIEW COMPARISON:  January 13, 2010 FINDINGS: There is slight increased opacity in the right mid lung laterally. Lungs elsewhere clear. Heart is borderline enlarged with pulmonary  vascularity within normal limits. There is atherosclerotic calcification aorta. No adenopathy. No bone lesions. There is calcification in the carotid arteries bilaterally. IMPRESSION: Focal area of infiltrate right mid lung suspicious for early pneumonia. Lungs elsewhere clear. Heart mildly prominent with pulmonary vascularity within normal limits. There is aortic atherosclerosis as well as bilateral carotid artery calcification. Electronically Signed   By: Lowella Grip III M.D.   On: 01/08/2016 15:23    Procedures Procedures (including critical care time)  Medications Ordered in ED Medications - No data to display   Initial Impression / Assessment and Plan / ED Course  I have reviewed the triage vital signs and the nursing notes.  Pertinent labs & imaging results that were available during my care of the patient were  reviewed by me and considered in my medical decision making (see chart for details).  Clinical Course     Patient is pleasant 82 year old male with history of CAD and bradycardia. He is being currently monitored and has become symptomatic while bradycardic. He is thus plan to have a pacemaker placed tomorrow morning. According to the remote monitoring he has had third-degree heart block. Today he developed numbness in his left arm. This got better after taking aspirin.  4:32 PM We'll start with serial troponins. Will likely need to admit to electrophysiology. The EP was made aware.  Will admit to step down.  CRITICAL CARE Performed by: Gardiner Sleeper Total critical care time: 45 minutes Critical care time was exclusive of separately billable procedures and treating other patients. Critical care was necessary to treat or prevent imminent or life-threatening deterioration. Critical care was time spent personally by me on the following activities: development of treatment plan with patient and/or surrogate as well as nursing, discussions with consultants, evaluation  of patient's response to treatment, examination of patient, obtaining history from patient or surrogate, ordering and performing treatments and interventions, ordering and review of laboratory studies, ordering and review of radiographic studies, pulse oximetry and re-evaluation of patient's condition.   Final Clinical Impressions(s) / ED Diagnoses   Final diagnoses:  Bradycardia    New Prescriptions New Prescriptions   No medications on file     Alika Saladin Julio Alm, MD 01/08/16 [phone]

## 2016-01-09 ENCOUNTER — Encounter (HOSPITAL_COMMUNITY)
Admission: EM | Disposition: A | Discharge status: Home / Self Care | Payer: Self-pay | Source: Home / Self Care | Attending: Cardiology

## 2016-01-09 ENCOUNTER — Encounter (HOSPITAL_COMMUNITY): Payer: Self-pay | Admitting: Internal Medicine

## 2016-01-09 ENCOUNTER — Ambulatory Visit (HOSPITAL_COMMUNITY): Admission: RE | Admit: 2016-01-09 | Payer: Medicare Other | Source: Ambulatory Visit | Admitting: Internal Medicine

## 2016-01-09 DIAGNOSIS — I5022 Chronic systolic (congestive) heart failure: Secondary | ICD-10-CM

## 2016-01-09 DIAGNOSIS — I442 Atrioventricular block, complete: Secondary | ICD-10-CM

## 2016-01-09 HISTORY — PX: EP IMPLANTABLE DEVICE: SHX172B

## 2016-01-09 LAB — SURGICAL PCR SCREEN
MRSA, PCR: NEGATIVE
STAPHYLOCOCCUS AUREUS: NEGATIVE

## 2016-01-09 LAB — TROPONIN I
TROPONIN I: 0.05 ng/mL — AB (ref <0.03)
Troponin I: 0.06 ng/mL (ref <0.03)

## 2016-01-09 SURGERY — BIV PACEMAKER INSERTION CRT-P

## 2016-01-09 MED ORDER — ACETAMINOPHEN 325 MG PO TABS
325.0000 mg | ORAL_TABLET | ORAL | Status: DC | PRN
  Administered 2016-01-09: 650 mg via ORAL
  Administered 2016-01-10: 325 mg via ORAL
  Filled 2016-01-09: qty 2
  Filled 2016-01-09: qty 1

## 2016-01-09 MED ORDER — FENTANYL CITRATE (PF) 100 MCG/2ML IJ SOLN
INTRAMUSCULAR | Status: DC | PRN
  Administered 2016-01-09: 25 ug via INTRAVENOUS
  Administered 2016-01-09: 12.5 ug via INTRAVENOUS

## 2016-01-09 MED ORDER — HEPARIN (PORCINE) IN NACL 2-0.9 UNIT/ML-% IJ SOLN
INTRAMUSCULAR | Status: DC | PRN
  Administered 2016-01-09: 08:00:00

## 2016-01-09 MED ORDER — LIDOCAINE HCL (PF) 1 % IJ SOLN
INTRAMUSCULAR | Status: DC | PRN
  Administered 2016-01-09: 45 mL via INTRADERMAL

## 2016-01-09 MED ORDER — ONDANSETRON HCL 4 MG/2ML IJ SOLN: 4.0000 mg | Freq: Four times a day (QID) | INTRAMUSCULAR | Status: DC | PRN

## 2016-01-09 MED ORDER — IOPAMIDOL (ISOVUE-370) INJECTION 76%
INTRAVENOUS | Status: DC | PRN
  Administered 2016-01-09: 5 mL via INTRAVENOUS
  Administered 2016-01-09: 7 mL via INTRAVENOUS

## 2016-01-09 MED ORDER — CEFAZOLIN IN D5W 1 GM/50ML IV SOLN
1.0000 g | Freq: Four times a day (QID) | INTRAVENOUS | Status: AC
  Administered 2016-01-09 – 2016-01-10 (×3): 1 g via INTRAVENOUS
  Filled 2016-01-09 (×3): qty 50

## 2016-01-09 MED ORDER — MIDAZOLAM HCL 5 MG/5ML IJ SOLN
INTRAMUSCULAR | Status: DC | PRN
  Administered 2016-01-09 (×3): 1 mg via INTRAVENOUS

## 2016-01-09 SURGICAL SUPPLY — 17 items
ALLURE CRT PM3262 (Pacemaker) ×3 IMPLANT
CABLE SURGICAL S-101-97-12 (CABLE) ×3 IMPLANT
CATH CPS DIRECT 135 DS2C020 (CATHETERS) ×3 IMPLANT
CATH CPS QUART CN DS2N029-65 (CATHETERS) ×3 IMPLANT
CATH HEX JOS 2-5-2 65CM 6F REP (CATHETERS) ×3 IMPLANT
CPS IMPLANT KIT 410190 (MISCELLANEOUS) ×3 IMPLANT
GUIDEWIRE ANGLED .035X150CM (WIRE) ×3 IMPLANT
KIT ESSENTIALS PG (KITS) ×3 IMPLANT
LEAD QUARTET 1458Q-86CM (Lead) ×3 IMPLANT
LEAD TENDRIL MRI 52CM LPA1200M (Lead) ×3 IMPLANT
LEAD TENDRIL MRI 58CM LPA1200M (Lead) ×3 IMPLANT
PACEMAKER ALLURE CRT (Pacemaker) ×1 IMPLANT
PAD DEFIB LIFELINK (PAD) ×3 IMPLANT
SHEATH CLASSIC 8F (SHEATH) ×6 IMPLANT
SLITTER UNIVERSAL DS2A003 (MISCELLANEOUS) ×3 IMPLANT
TRAY PACEMAKER INSERTION (PACKS) ×3 IMPLANT
WIRE ACUITY WHISPER EDS 4648 (WIRE) ×3 IMPLANT

## 2016-01-09 NOTE — Discharge Summary (Signed)
ELECTROPHYSIOLOGY PROCEDURE DISCHARGE SUMMARY    Patient ID: Nicholas Sanchez,  MRN: [id number], DOB/AGE: [birth year] 80 y.o.  Admit date: 01/08/2016 Discharge date: 01/10/16  Primary Care Physician: Alonza Bogus, MD  Primary Cardiologist: Dr. Jacinta Shoe Electrophysiologist: Dr. Lovena Le  Primary Discharge Diagnosis:  1. Sinus node dysfunction 2. High degree AVblock 3. angina  Secondary Discharge Diagnosis:  1. CAD 2. HTN 3. HLD 4. ICM  Allergies  Allergen Reactions  . Ace Inhibitors Nausea Only and Other (See Comments)  . Tape Rash    And Bandaids, too     Procedures This Admission:  1.  Implantation of a CRT-PPM on 01/09/16 by Dr Lovena Le.  The patient received a St. Jude (serial number [id number]) pacemaker, Celoron (serial number [id number]) right atrial lead and a St Jude Medical model [id number] (serial number [id number]) right ventricular lead,St. Jude Guide and advanced into the lateral vein. A St. Jude(serial number [id number]) LV lead. There were no immediate post procedure complications. 2.  CXR on 01/10/16 demonstrated no pneumothorax status post device implantation.   Brief HPI: Nicholas Sanchez is a 82 y.o. male was admitted to [facility] 01/08/16 with c/o upon walking out of the BR to the den felt like left arm discomfort, perhaps numb with some left facial discomfort/tingling as well, he states he was concerned because this is similar to his heart pain at the time of his last stent except he had no CP this time, palpitations or SOB, this lasted about 5 minutes unchanged with position or ambulation, felt like his legs were heavy b/l from the knees down and felt like his feet were numb, perhaps R>L, and weak in general.  He took/chewed an ASA and called 911.  When they arrived and got him settled in the ambulance, he noted all of these symptoms had resolved, and remain resolved.  He did not have near syncope or syncope.  He was observed in the ER to have  high degree AVblock with rates 30's, which is known for the patient, he was admitted for further evaluation and monitoring.  Hospital Course:  The patient PMHx noted for known high degree AVblock, bradycardia and had been already on Dr. Tanna Furry schedule for CRT-pacer implant 01/09/16.  Troponin's were cycled and  Not felt to be significant.  Echo noted his EF 45-50% with mild p.HTN.  He had no ongoing symptoms of any kind.  He underwent CRT-pacer implant with Dr. Lovena Le 01/09/16.  He was monitored on telemetry overnight which demonstrated AV pacing and SR with V pacing.  Left chest was without hematoma or ecchymosis.  The device was interrogated and found to be functioning normally.  CXR was obtained and demonstrated no pneumothorax status post device implantation.  Wound care, arm mobility, and restrictions were reviewed with the patient.  The patient was examined by Dr. Lovena Le and considered stable for discharge to home.    Physical Exam: Vitals:   01/09/16 2032 01/10/16 0004 01/10/16 0451 01/10/16 0742  BP: 130/73 (!) 156/77 (!) 151/90 (!) 175/96  Pulse: 66 63 64 63  Resp: 18 (!) 23 15 (!) 24  Temp: 97.7 F (36.5 C) 97.7 F (36.5 C) 98.4 F (36.9 C) 97.9 F (36.6 C)  TempSrc: Oral Oral Oral Oral  SpO2: 91% 95% 97% 100%  Weight:   216 lb 1.6 oz (98 kg)   Height:        GEN- The patient is well appearing, alert and oriented x 3 today.  HEENT: normocephalic, atraumatic; sclera clear, conjunctiva pink; hearing intact; oropharynx clear; neck supple, no JVP Lungs- Clear to ausculation bilaterally, normal work of breathing.  No wheezes, rales, rhonchi Heart- Regular rate and rhythm, no murmurs, rubs or gallops, PMI not laterally displaced GI- soft, non-tender, non-distended Extremities- no clubbing, cyanosis, or edema, easily palpated pedal pulses b/l MS- no significant deformity or atrophy Skin- warm and dry, no rash or lesion, left chest without hematoma/ecchymosis Psych- euthymic mood,  full affect Neuro- no gross deficits   Labs:   Lab Results  Component Value Date   WBC 7.3 01/08/2016   HGB 14.0 01/08/2016   HCT 41.2 01/08/2016   MCV 93.2 01/08/2016   PLT 141 (L) 01/08/2016     Recent Labs Lab 01/08/16 1523  NA 134*  K 4.2  CL 101  CO2 26  BUN 10  CREATININE 1.17  CALCIUM 9.2  GLUCOSE 89    Discharge Medications:    Medication List    TAKE these medications   acetaminophen 500 MG tablet Commonly known as:  TYLENOL Take 500 mg by mouth every 6 (six) hours as needed for pain.   aspirin EC 81 MG tablet Take 1 tablet (81 mg total) by mouth daily.   Dorzolamide HCl-Timolol Mal PF 22.3-6.8 MG/ML Soln Place 1 drop into the left eye 2 (two) times daily.   hydrochlorothiazide 25 MG tablet Commonly known as:  HYDRODIURIL TAKE ONE (1) TABLET EACH DAY What changed:  See the new instructions.   latanoprost 0.005 % ophthalmic solution Commonly known as:  XALATAN Place 1 drop into both eyes at bedtime.   lovastatin 10 MG tablet Commonly known as:  MEVACOR Take 1 tablet (10 mg total) by mouth daily.   nitroGLYCERIN 0.4 MG SL tablet Commonly known as:  NITROSTAT Place 1 tablet (0.4 mg total) under the tongue every 5 (five) minutes x 3 doses as needed for chest pain.   pantoprazole 40 MG tablet Commonly known as:  PROTONIX Take 40 mg by mouth daily.   SYSTANE BALANCE OP Place 1 drop into both eyes 4 (four) times daily.   vitamin B-12 1000 MCG tablet Commonly known as:  CYANOCOBALAMIN Take 1,000 mcg by mouth daily.       Disposition:  Discharge Instructions    Diet - low sodium heart healthy    Complete by:  As directed    Increase activity slowly    Complete by:  As directed      Follow-up Information    [facility] Follow up on 01/21/2016.   Specialty:  Cardiology Why:  4:00PM, wound check Contact information: [address], [address] [place]       Cristopher Peru, MD Follow up on 04/10/2016.   Specialty:  Cardiology Why:  10:45AM Contact information: [address] [address] [phone]           Duration of Discharge Encounter: Greater than 30 minutes including physician time.  Venetia Night, PA-C 01/10/2016 9:04 AM  EP Attending  Patient seen and examined. His BiV PPM has been interrogated. His CXR has been reviewed. His pocket demonstrates no hematoma. The patient is stable for DC home. Usual followup.   [name]

## 2016-01-09 NOTE — H&P (View-Only) (Signed)
HPI Nicholas Sanchez is referred today by Dr. Raliegh Ip for consideration for PPM insertion. He is a pleasant 82 yo man with HTN who has felt severe fatigue and weakness over the past several months. He had his sinus nodal blocking drug stopped when he was discovered to have marked bradycardia. The patient has significant AV conduction system disease. We do not know what his EF is. He denies anginal symptoms. No syncope. No edema. Allergies  Allergen Reactions  . Ace Inhibitors      Current Outpatient Prescriptions  Medication Sig Dispense Refill  . acetaminophen (TYLENOL) 500 MG tablet Take 500 mg by mouth every 6 (six) hours as needed for pain.    [name] aspirin EC 81 MG tablet Take 1 tablet (81 mg total) by mouth daily.    . hydrochlorothiazide (HYDRODIURIL) 25 MG tablet TAKE ONE (1) TABLET EACH DAY 90 tablet 0  . lovastatin (MEVACOR) 10 MG tablet Take 1 tablet (10 mg total) by mouth daily. 30 tablet 11  . nitroGLYCERIN (NITROSTAT) 0.4 MG SL tablet Place 1 tablet (0.4 mg total) under the tongue every 5 (five) minutes x 3 doses as needed for chest pain. 25 tablet 3  . vitamin B-12 (CYANOCOBALAMIN) 1000 MCG tablet Take 1,000 mcg by mouth daily.     No current facility-administered medications for this visit.      Past Medical History:  Diagnosis Date  . CAD (coronary artery disease)    status post ST elevation myocardial infarction March 2010, status post bare-metal stent x2 to the proximal and distal RCA.  Subsequent stent drug-eluting stent to the LAD in a staged fashion.,  Cardiolite October 2010, negative for ischemia with normal ejection fraction  . Cough, persistent   . GERD (gastroesophageal reflux disease)   . History of BPH   . HTN (hypertension)   . Hypercholesteremia     ROS:   All systems reviewed and negative except as noted in the HPI.   Past Surgical History:  Procedure Laterality Date  . CORONARY STENT PLACEMENT  04/2008   RCA and LAD     Family History  Problem  Relation Age of Onset  . Emphysema Mother     mother died with emphysema and cancer  . Cancer Mother      Social History   Social History  . Marital status: Single    Spouse name: N/A  . Number of children: N/A  . Years of education: N/A   Occupational History  . retired     Press photographer   Social History Main Topics  . Smoking status: Former Smoker    Packs/day: 2.50    Years: 25.00    Types: Cigarettes    Quit date: 06/14/1973  . Smokeless tobacco: Former Systems developer    Types: [place] date: 06/14/1973     Comment: some/chewed very little for about 2 years  . Alcohol use No  . Drug use: No  . Sexual activity: Not on file   Other Topics Concern  . Not on file   Social History Narrative  . No narrative on file     BP (!) 170/64   Pulse (!) 44   Ht '6\' 1"'$  (1.854 m)   Wt 222 lb (100.7 kg)   SpO2 97%   BMI 29.29 kg/m   Physical Exam:  Well appearing NAD HEENT: Unremarkable Neck:  No JVD, no thyromegally Lymphatics:  No adenopathy Back:  No CVA tenderness Lungs:  Clear with  no wheezes HEART:  Regular brady rhythm, no murmurs, no rubs, no clicks Abd:  soft, positive bowel sounds, no organomegally, no rebound, no guarding Ext:  2 plus pulses, no edema, no cyanosis, no clubbing Skin:  No rashes no nodules Neuro:  CN II through XII intact, motor grossly intact  EKG - reviewed marked sinus bradycardia with high grade AV block   Assess/Plan: 1. Sinus node dysfunction/AV block  He has both AV block and sinus node dysfunction. I have recommended he undergo PPM insertion. Will check his 2D echo as he has class 2-3 CHF symptoms and I would place a biv PPM if his EF were down. 2. HTN - after his device is placed, I would anticipate that he be placed on a beta blocker 3. CAD - he denies anginal symptoms. No change in meds.  Nicholas Sanchez.D.

## 2016-01-09 NOTE — Interval H&P Note (Signed)
History and Physical Interval Note: Since I last saw the patient he has been stable except he came in over night with arm pain and has ruled out. Troponin essentially normal. Will plan to proceed with PPM insertion. Since he has been found to have mild LV dysfunction in the setting of CHB, will place a biv device.  01/09/2016 7:45 AM  Beverly Sessions  has presented today for surgery, with the diagnosis of bradicardia - hb  The various methods of treatment have been discussed with the patient and family. After consideration of risks, benefits and other options for treatment, the patient has consented to  Procedure(s): BiV Pacemaker Insertion CRT-P (N/A) as a surgical intervention .  The patient's history has been reviewed, patient examined, no change in status, stable for surgery.  I have reviewed the patient's chart and labs.  Questions were answered to the patient's satisfaction.     [name]

## 2016-01-09 NOTE — Discharge Instructions (Signed)
° ° °  Supplemental Discharge Instructions for  Pacemaker/Defibrillator Patients  Activity No heavy lifting or vigorous activity with your left/right arm for 6 to 8 weeks.  Do not raise your left/right arm above your head for one week.  Gradually raise your affected arm as drawn below.             01/13/16                   01/14/16                  01/15/16                 01/16/16 __  NO DRIVING for 1 week    ; you may begin driving on 01/65/53 .  WOUND CARE - Keep the wound area clean and dry.  Do not get this area wet for one week. No showers for one week; you may shower on 01/16/16 . - The tape/steri-strips on your wound will fall off; do not pull them off.  No bandage is needed on the site.  DO  NOT apply any creams, oils, or ointments to the wound area. - If you notice any drainage or discharge from the wound, any swelling or bruising at the site, or you develop a fever > 101? F after you are discharged home, call the office at once.  Special Instructions - You are still able to use cellular telephones; use the ear opposite the side where you have your pacemaker/defibrillator.  Avoid carrying your cellular phone near your device. - When traveling through airports, show security personnel your identification card to avoid being screened in the metal detectors.  Ask the security personnel to use the hand wand. - Avoid arc welding equipment, MRI testing (magnetic resonance imaging), TENS units (transcutaneous nerve stimulators).  Call the office for questions about other devices. - Avoid electrical appliances that are in poor condition or are not properly grounded. - Microwave ovens are safe to be near or to operate.  Additional information for defibrillator patients should your device go off: - If your device goes off ONCE and you feel fine afterward, notify the device clinic nurses. - If your device goes off ONCE and you do not feel well afterward, call 911. - If your device goes off  TWICE, call 911. - If your device goes off THREE times in one day, call 911.  DO NOT DRIVE YOURSELF OR A FAMILY MEMBER WITH A DEFIBRILLATOR TO THE HOSPITAL--CALL 911.

## 2016-01-10 ENCOUNTER — Inpatient Hospital Stay (HOSPITAL_COMMUNITY): Payer: Medicare Other

## 2016-01-10 NOTE — Consult Note (Signed)
            [facility] CM Primary Care Navigator  01/10/2016  Nicholas Sanchez 04/13/1933 [id number]   Wentto see patient at the bedside to identify possible discharge needs butpatient was already discharged.  Primary care provider's office contacted (Joyce)to notify of patient's discharge and need for post hospital follow-up and transition of care.   For additional questions please contact:  Edwena Felty A. Demesha Boorman, BSN, RN-BC [facility] PRIMARY CARE Navigator Cell: [phone]

## 2016-01-15 ENCOUNTER — Inpatient Hospital Stay (HOSPITAL_COMMUNITY)
Admission: RE | Admit: 2016-01-15 | Discharge: 2016-01-15 | Disposition: A | Discharge status: Home / Self Care | Payer: Medicare Other | Source: Ambulatory Visit | Attending: Internal Medicine | Admitting: Internal Medicine

## 2016-01-20 ENCOUNTER — Ambulatory Visit: Payer: Medicare Other | Admitting: Cardiology

## 2016-01-21 ENCOUNTER — Ambulatory Visit (INDEPENDENT_AMBULATORY_CARE_PROVIDER_SITE_OTHER): Payer: Medicare Other | Admitting: Internal Medicine

## 2016-01-21 ENCOUNTER — Ambulatory Visit: Payer: Medicare Other

## 2016-01-21 ENCOUNTER — Encounter: Payer: Self-pay | Admitting: *Deleted

## 2016-01-21 ENCOUNTER — Encounter: Payer: Self-pay | Admitting: Internal Medicine

## 2016-01-21 DIAGNOSIS — I442 Atrioventricular block, complete: Secondary | ICD-10-CM | POA: Diagnosis not present

## 2016-01-21 DIAGNOSIS — Z950 Presence of cardiac pacemaker: Secondary | ICD-10-CM | POA: Diagnosis not present

## 2016-01-21 DIAGNOSIS — I480 Paroxysmal atrial fibrillation: Secondary | ICD-10-CM | POA: Insufficient documentation

## 2016-01-21 LAB — CUP PACEART INCLINIC DEVICE CHECK
Brady Statistic RV Percent Paced: 99.84 %
Date Time Interrogation Session: 20171121185107
Implantable Lead Implant Date: 20171109
Implantable Lead Location: 753860
Implantable Pulse Generator Implant Date: 20171109
Lead Channel Pacing Threshold Pulse Width: 0.5 ms
Lead Channel Pacing Threshold Pulse Width: 0.5 ms
Lead Channel Pacing Threshold Pulse Width: 1 ms
Lead Channel Setting Pacing Amplitude: 3.5 V
Lead Channel Setting Sensing Sensitivity: 4 mV
MDC IDC LEAD IMPLANT DT: 20171109
MDC IDC LEAD IMPLANT DT: 20171109
MDC IDC LEAD LOCATION: 753858
MDC IDC LEAD LOCATION: 753859
MDC IDC MSMT BATTERY VOLTAGE: 3.01 V
MDC IDC MSMT LEADCHNL LV IMPEDANCE VALUE: 562.5 Ohm
MDC IDC MSMT LEADCHNL LV PACING THRESHOLD AMPLITUDE: 2.25 V
MDC IDC MSMT LEADCHNL RA IMPEDANCE VALUE: 537.5 Ohm
MDC IDC MSMT LEADCHNL RA PACING THRESHOLD AMPLITUDE: 0.5 V
MDC IDC MSMT LEADCHNL RA SENSING INTR AMPL: 5 mV
MDC IDC MSMT LEADCHNL RV IMPEDANCE VALUE: 587.5 Ohm
MDC IDC MSMT LEADCHNL RV PACING THRESHOLD AMPLITUDE: 0.75 V
MDC IDC SET LEADCHNL LV PACING AMPLITUDE: 3.5 V
MDC IDC SET LEADCHNL LV PACING PULSEWIDTH: 0.5 ms
MDC IDC SET LEADCHNL RA PACING AMPLITUDE: 3.5 V
MDC IDC SET LEADCHNL RV PACING PULSEWIDTH: 0.5 ms
MDC IDC STAT BRADY RA PERCENT PACED: 90 %
Pulse Gen Model: 3262
Pulse Gen Serial Number: 7949810

## 2016-01-21 MED ORDER — RIVAROXABAN 20 MG PO TABS: 20.0000 mg | ORAL_TABLET | Freq: Every day | ORAL | 6 refills | Status: DC

## 2016-01-21 NOTE — Patient Instructions (Addendum)
Medication Instructions:  Your physician has recommended you make the following change in your medication:  1) STOP Aspirin 81 mg 2) START Xarelto 20 mg daily   Labwork: None Ordered   Testing/Procedures: None Ordered   Follow-Up: Follow-up with Dr. Lovena Le February 2018  Any Other Special Instructions Will Be Listed Below (If Applicable).     If you need a refill on your cardiac medications before your next appointment, please call your pharmacy.

## 2016-01-21 NOTE — Progress Notes (Addendum)
HPI Mr. Consiglio returns today for an unschedule visit as he was found to have atrial fib on his PPM. He is a pleasant 82 yo man with chronic systolic heart failure, known heart block, TIA, HTN, and underwent PPM insertion several weeks ago. The patient has done well. He does not feel palpitations. He has not had syncope. No sob or chest pain.  Allergies  Allergen Reactions  . Ace Inhibitors Nausea Only and Other (See Comments)  . Tape Rash    And Bandaids, too     Current Outpatient Prescriptions  Medication Sig Dispense Refill  . acetaminophen (TYLENOL) 500 MG tablet Take 500 mg by mouth every 6 (six) hours as needed for pain.    . Dorzolamide HCl-Timolol Mal PF 22.3-6.8 MG/ML SOLN Place 1 drop into the left eye 2 (two) times daily.     . hydrochlorothiazide (HYDRODIURIL) 25 MG tablet TAKE ONE (1) TABLET EACH DAY (Patient taking differently: Take 25 mg by mouth once a day) 90 tablet 0  . latanoprost (XALATAN) 0.005 % ophthalmic solution Place 1 drop into both eyes at bedtime.    . lovastatin (MEVACOR) 10 MG tablet Take 1 tablet (10 mg total) by mouth daily. 30 tablet 11  . nitroGLYCERIN (NITROSTAT) 0.4 MG SL tablet Place 1 tablet (0.4 mg total) under the tongue every 5 (five) minutes x 3 doses as needed for chest pain. 25 tablet 3  . pantoprazole (PROTONIX) 40 MG tablet Take 40 mg by mouth daily.    [name] Propylene Glycol (SYSTANE BALANCE OP) Place 1 drop into both eyes 4 (four) times daily.     . vitamin B-12 (CYANOCOBALAMIN) 1000 MCG tablet Take 1,000 mcg by mouth daily.    . rivaroxaban (XARELTO) 20 MG TABS tablet Take 1 tablet (20 mg total) by mouth daily with supper. 30 tablet 6   No current facility-administered medications for this visit.      Past Medical History:  Diagnosis Date  . CAD (coronary artery disease)    status post ST elevation myocardial infarction March 2010, status post bare-metal stent x2 to the proximal and distal RCA.  Subsequent stent drug-eluting  stent to the LAD in a staged fashion.,  Cardiolite October 2010, negative for ischemia with normal ejection fraction  . Cough, persistent   . GERD (gastroesophageal reflux disease)   . History of BPH   . HTN (hypertension)   . Hypercholesteremia     ROS:   All systems reviewed and negative except as noted in the HPI.   Past Surgical History:  Procedure Laterality Date  . CORONARY STENT PLACEMENT  04/2008   RCA and LAD  . EP IMPLANTABLE DEVICE N/A 01/09/2016   Procedure: BiV Pacemaker Insertion CRT-P;  Surgeon: [name], MD;  Location: [place] [facility];  Service: Cardiovascular;  Laterality: N/A;     Family History  Problem Relation Age of Onset  . Emphysema Mother     mother died with emphysema and cancer  . Cancer Mother      Social History   Social History  . Marital status: Single    Spouse name: N/A  . Number of children: N/A  . Years of education: N/A   Occupational History  . retired     Press photographer   Social History Main Topics  . Smoking status: Former Smoker    Packs/day: 2.50    Years: 25.00    Types: Cigarettes    Quit date: 06/14/1973  . Smokeless  tobacco: Former Systems developer    Types: Chew    Quit date: 06/14/1973     Comment: some/chewed very little for about 2 years  . Alcohol use No  . Drug use: No  . Sexual activity: Not on file   Other Topics Concern  . Not on file   Social History Narrative  . No narrative on file     BP - 132/76, P- 64, R - 18  Physical Exam:  Well appearing 82 yo man, NAD HEENT: Unremarkable Neck:  6 cm JVD, no thyromegally Lymphatics:  No adenopathy Back:  No CVA tenderness Lungs:  Clear with no wheezes HEART:  Regular brady rhythm, no murmurs, no rubs, no clicks Abd:  soft, positive bowel sounds, no organomegally, no rebound, no guarding Ext:  2 plus pulses, no edema, no cyanosis, no clubbing Skin:  No rashes no nodules Neuro:  CN II through XII intact, motor grossly intact  EKG - none  Device interogation  - normal biv PPM function. He has atrial fib with a cvr with up to over 2 hours duration.   Assess/Plan: 1.  New onset atrial fib - his CHADS score is 6. Will start Xarelto 20 mg daily 2. HTN - will consider starting beta blocker but will hold off with new meds. 3. CAD - he denies anginal symptoms. No change in meds. 4. Coags - we discussed the pro's and con's of warfarin vs eliquis vs xarelto. He would like to start with Xarelto.  [name]

## 2016-02-14 ENCOUNTER — Other Ambulatory Visit: Payer: Self-pay | Admitting: Cardiovascular Disease

## 2016-02-17 ENCOUNTER — Telehealth: Payer: Self-pay

## 2016-02-17 NOTE — Telephone Encounter (Signed)
Cialis 10 mg approved by [employer], but only a quantity of 4 per 30 days. Ref # [id number]. Faxed to his local pharmacy.

## 2016-02-18 ENCOUNTER — Telehealth: Payer: Self-pay

## 2016-02-18 NOTE — Telephone Encounter (Signed)
Xarelto '20mg'$  approved by [employer] [facility]. [id number] and is valid through 03/01/2017.

## 2016-03-17 ENCOUNTER — Telehealth: Payer: Self-pay | Admitting: Internal Medicine

## 2016-03-17 NOTE — Telephone Encounter (Signed)
Pt called because he has blood in his urine x 2 starting at 9:30 AM this morning. Pt take Xarelto 20 mg once daily. Pt started this medication on 01/21/16. When asking if he stop taking the 81 mg aspirin when he started the Xarelto, pt states that he does not take that small pill anymore, but he takes the 325 mg aspirin when he needs for pain. Pt states he did take one 325 mg yesterday. Pt was made aware that he can only take tylenols if needed not aspirin for pain . Pt wants to see what is Dr. Tanna Furry recommendations because he had toled  him to take the big aspirin when needed. Pt has an appointment with Dr. Lovena Le on 04/10/16 at 10:45 AM. Spoke with Dr. Lovena Le MD. He recommends for pt to hold the Xarelto 20 mg today and tomorrow pt restart this medication Thursday 18 th.  Pt not  Is not to take any aspirin. Pt to take Tylenol for pain instead. Pt is aware of MD's recommendations, he verbalized understanding.

## 2016-03-17 NOTE — Telephone Encounter (Signed)
New Message   Per pt had Pacemaker placed 2 months ago, and is currently passing blood through urine. Requesting call back

## 2016-04-10 ENCOUNTER — Encounter: Payer: Self-pay | Admitting: Internal Medicine

## 2016-04-10 ENCOUNTER — Ambulatory Visit (INDEPENDENT_AMBULATORY_CARE_PROVIDER_SITE_OTHER): Payer: Medicare Other | Admitting: Internal Medicine

## 2016-04-10 ENCOUNTER — Encounter (INDEPENDENT_AMBULATORY_CARE_PROVIDER_SITE_OTHER): Payer: Self-pay

## 2016-04-10 VITALS — BP 146/82 | HR 60 | Ht 73.0 in | Wt 220.8 lb

## 2016-04-10 DIAGNOSIS — Z950 Presence of cardiac pacemaker: Secondary | ICD-10-CM

## 2016-04-10 DIAGNOSIS — I442 Atrioventricular block, complete: Secondary | ICD-10-CM

## 2016-04-10 LAB — CUP PACEART INCLINIC DEVICE CHECK
Date Time Interrogation Session: 20180209145324
Implantable Lead Location: 753860
Implantable Pulse Generator Implant Date: 20171109
Lead Channel Impedance Value: 425 Ohm
Lead Channel Impedance Value: 537.5 Ohm
Lead Channel Pacing Threshold Amplitude: 0.5 V
Lead Channel Pacing Threshold Amplitude: 0.5 V
Lead Channel Pacing Threshold Amplitude: 1.25 V
Lead Channel Pacing Threshold Amplitude: 1.25 V
Lead Channel Pacing Threshold Pulse Width: 0.5 ms
Lead Channel Pacing Threshold Pulse Width: 0.8 ms
Lead Channel Pacing Threshold Pulse Width: 0.8 ms
Lead Channel Setting Pacing Amplitude: 2 V
Lead Channel Setting Pacing Amplitude: 2.5 V
Lead Channel Setting Sensing Sensitivity: 4 mV
MDC IDC LEAD IMPLANT DT: 20171109
MDC IDC LEAD IMPLANT DT: 20171109
MDC IDC LEAD IMPLANT DT: 20171109
MDC IDC LEAD LOCATION: 753858
MDC IDC LEAD LOCATION: 753859
MDC IDC MSMT BATTERY VOLTAGE: 2.98 V
MDC IDC MSMT LEADCHNL LV IMPEDANCE VALUE: 762.5 Ohm
MDC IDC MSMT LEADCHNL RA PACING THRESHOLD AMPLITUDE: 0.5 V
MDC IDC MSMT LEADCHNL RA PACING THRESHOLD PULSEWIDTH: 0.5 ms
MDC IDC MSMT LEADCHNL RA SENSING INTR AMPL: 5 mV
MDC IDC MSMT LEADCHNL RV PACING THRESHOLD AMPLITUDE: 0.5 V
MDC IDC MSMT LEADCHNL RV PACING THRESHOLD PULSEWIDTH: 0.5 ms
MDC IDC MSMT LEADCHNL RV PACING THRESHOLD PULSEWIDTH: 0.5 ms
MDC IDC MSMT LEADCHNL RV SENSING INTR AMPL: 12 mV
MDC IDC SET LEADCHNL LV PACING AMPLITUDE: 2 V
MDC IDC SET LEADCHNL LV PACING PULSEWIDTH: 0.8 ms
MDC IDC SET LEADCHNL RV PACING PULSEWIDTH: 0.5 ms
MDC IDC STAT BRADY RA PERCENT PACED: 85 %
MDC IDC STAT BRADY RV PERCENT PACED: 99.8 %
Pulse Gen Model: 3262
Pulse Gen Serial Number: 7949810

## 2016-04-10 NOTE — Patient Instructions (Addendum)
Medication Instructions:  Your physician recommends that you continue on your current medications as directed. Please refer to the Current Medication list given to you today.   Labwork: None Ordered   Testing/Procedures: None Ordered   Follow-Up: Your physician wants you to follow-up in: 9 months with Dr. Lovena Le [facility] office) . You will receive a reminder letter in the mail two months in advance. If you don't receive a letter, please call our office to schedule the follow-up appointment.  Remote monitoring is used to monitor your Pacemaker from home. This monitoring reduces the number of office visits required to check your device to one time per year. It allows [place] to keep an eye on the functioning of your device to ensure it is working properly. You are scheduled for a device check from home on 07/09/16. You may send your transmission at any time that day. If you have a wireless device, the transmission will be sent automatically. After your physician reviews your transmission, you will receive a postcard with your next transmission date.     Any Other Special Instructions Will Be Listed Below (If Applicable).     If you need a refill on your cardiac medications before your next appointment, please call your pharmacy.

## 2016-04-10 NOTE — Progress Notes (Signed)
HPI Nicholas Sanchez returns today for ongoing evaluation of symptomatic bradycardia, Mild LV dysfunction and PAF. He underwent BiV PPM insertion several months ago. In the interim, the patient has done well. He does not feel palpitations. He has not had syncope. No sob or chest pain.  Allergies  Allergen Reactions  . Ace Inhibitors Nausea Only and Other (See Comments)  . Tape Rash    And Bandaids, too     Current Outpatient Prescriptions  Medication Sig Dispense Refill  . acetaminophen (TYLENOL) 500 MG tablet Take 500 mg by mouth every 6 (six) hours as needed for pain.    . Dorzolamide HCl-Timolol Mal PF 22.3-6.8 MG/ML SOLN Place 1 drop into the left eye 2 (two) times daily.     . hydrochlorothiazide (HYDRODIURIL) 25 MG tablet TAKE ONE (1) TABLET EACH DAY 90 tablet 3  . latanoprost (XALATAN) 0.005 % ophthalmic solution Place 1 drop into both eyes at bedtime.    . lovastatin (MEVACOR) 10 MG tablet Take 1 tablet (10 mg total) by mouth daily. 30 tablet 11  . nitroGLYCERIN (NITROSTAT) 0.4 MG SL tablet Place 1 tablet (0.4 mg total) under the tongue every 5 (five) minutes x 3 doses as needed for chest pain. 25 tablet 3  . pantoprazole (PROTONIX) 40 MG tablet Take 40 mg by mouth daily.    [name] Propylene Glycol (SYSTANE BALANCE OP) Place 1 drop into both eyes 4 (four) times daily.     . rivaroxaban (XARELTO) 20 MG TABS tablet Take 1 tablet (20 mg total) by mouth daily with supper. 30 tablet 6  . vitamin B-12 (CYANOCOBALAMIN) 1000 MCG tablet Take 1,000 mcg by mouth daily.     No current facility-administered medications for this visit.      Past Medical History:  Diagnosis Date  . CAD (coronary artery disease)    status post ST elevation myocardial infarction March 2010, status post bare-metal stent x2 to the proximal and distal RCA.  Subsequent stent drug-eluting stent to the LAD in a staged fashion.,  Cardiolite October 2010, negative for ischemia with normal ejection fraction  . Cough,  persistent   . GERD (gastroesophageal reflux disease)   . History of BPH   . HTN (hypertension)   . Hypercholesteremia     ROS:   All systems reviewed and negative except as noted in the HPI.   Past Surgical History:  Procedure Laterality Date  . CORONARY STENT PLACEMENT  04/2008   RCA and LAD  . EP IMPLANTABLE DEVICE N/A 01/09/2016   Procedure: BiV Pacemaker Insertion CRT-P;  Surgeon: [name], MD;  Location: [place] [facility];  Service: Cardiovascular;  Laterality: N/A;     Family History  Problem Relation Age of Onset  . Emphysema Mother     mother died with emphysema and cancer  . Cancer Mother      Social History   Social History  . Marital status: Single    Spouse name: N/A  . Number of children: N/A  . Years of education: N/A   Occupational History  . retired     Press photographer   Social History Main Topics  . Smoking status: Former Smoker    Packs/day: 2.50    Years: 25.00    Types: Cigarettes    Quit date: 06/14/1973  . Smokeless tobacco: Former Systems developer    Types: [place] date: 06/14/1973     Comment: some/chewed very little for about 2 years  .  Alcohol use No  . Drug use: No  . Sexual activity: Not on file   Other Topics Concern  . Not on file   Social History Narrative  . No narrative on file     BP - 132/76, P- 64, R - 18  Physical Exam:  Well appearing 83 yo man, NAD HEENT: Unremarkable Neck:  6 cm JVD, no thyromegally Lymphatics:  No adenopathy Back:  No CVA tenderness Lungs:  Clear with no wheezes, well healed PPM incision. HEART:  Regular brady rhythm, no murmurs, no rubs, no clicks Abd:  soft, positive bowel sounds, no organomegally, no rebound, no guarding Ext:  2 plus pulses, no edema, no cyanosis, no clubbing Skin:  No rashes no nodules Neuro:  CN II through XII intact, motor grossly intact  EKG - none  Device interogation - normal biv PPM function. He has atrial fib with a cvr with up to over 2 hours  duration.   Assess/Plan: 1.  New onset atrial fib - his CHADS score is 6. Will start Xarelto 20 mg daily 2. HTN - will consider starting beta blocker but will hold off with new meds. 3. CAD - he denies anginal symptoms. No change in meds. 4. Coags - we discussed the pro's and con's of warfarin vs eliquis vs xarelto. He would like to start with Xarelto. 5. PPM - his BiV PPM is working normally. Will recheck in several months.   Nicholas Sanchez.D.

## 2016-04-23 DIAGNOSIS — K210 Gastro-esophageal reflux disease with esophagitis: Secondary | ICD-10-CM | POA: Diagnosis not present

## 2016-04-23 DIAGNOSIS — Z950 Presence of cardiac pacemaker: Secondary | ICD-10-CM | POA: Diagnosis not present

## 2016-04-23 DIAGNOSIS — I2510 Atherosclerotic heart disease of native coronary artery without angina pectoris: Secondary | ICD-10-CM | POA: Diagnosis not present

## 2016-04-23 DIAGNOSIS — I10 Essential (primary) hypertension: Secondary | ICD-10-CM | POA: Diagnosis not present

## 2016-06-18 DIAGNOSIS — H353112 Nonexudative age-related macular degeneration, right eye, intermediate dry stage: Secondary | ICD-10-CM | POA: Diagnosis not present

## 2016-06-18 DIAGNOSIS — H35372 Puckering of macula, left eye: Secondary | ICD-10-CM | POA: Diagnosis not present

## 2016-07-10 ENCOUNTER — Ambulatory Visit (INDEPENDENT_AMBULATORY_CARE_PROVIDER_SITE_OTHER): Payer: Medicare Other | Admitting: *Deleted

## 2016-07-10 DIAGNOSIS — I442 Atrioventricular block, complete: Secondary | ICD-10-CM

## 2016-07-10 LAB — CUP PACEART REMOTE DEVICE CHECK
Battery Remaining Longevity: 79 mo
Battery Remaining Percentage: 95.5 %
Battery Voltage: 2.99 V
Brady Statistic AS VS Percent: 1 %
Implantable Lead Implant Date: 20171109
Implantable Lead Location: 753859
Implantable Pulse Generator Implant Date: 20171109
Lead Channel Impedance Value: 410 Ohm
Lead Channel Impedance Value: 540 Ohm
Lead Channel Pacing Threshold Amplitude: 0.5 V
Lead Channel Pacing Threshold Amplitude: 1.25 V
Lead Channel Sensing Intrinsic Amplitude: 5 mV
Lead Channel Setting Pacing Amplitude: 2 V
Lead Channel Setting Pacing Amplitude: 2.5 V
Lead Channel Setting Pacing Pulse Width: 0.8 ms
Lead Channel Setting Sensing Sensitivity: 4 mV
MDC IDC LEAD IMPLANT DT: 20171109
MDC IDC LEAD IMPLANT DT: 20171109
MDC IDC LEAD LOCATION: 753858
MDC IDC LEAD LOCATION: 753860
MDC IDC MSMT LEADCHNL LV IMPEDANCE VALUE: 710 Ohm
MDC IDC MSMT LEADCHNL LV PACING THRESHOLD PULSEWIDTH: 0.8 ms
MDC IDC MSMT LEADCHNL RA PACING THRESHOLD PULSEWIDTH: 0.5 ms
MDC IDC MSMT LEADCHNL RV PACING THRESHOLD AMPLITUDE: 0.5 V
MDC IDC MSMT LEADCHNL RV PACING THRESHOLD PULSEWIDTH: 0.5 ms
MDC IDC MSMT LEADCHNL RV SENSING INTR AMPL: 12 mV
MDC IDC PG SERIAL: 7949810
MDC IDC SESS DTM: 20180511122758
MDC IDC SET LEADCHNL LV PACING AMPLITUDE: 2 V
MDC IDC SET LEADCHNL RV PACING PULSEWIDTH: 0.5 ms
MDC IDC STAT BRADY AP VP PERCENT: 92 %
MDC IDC STAT BRADY AP VS PERCENT: 1 %
MDC IDC STAT BRADY AS VP PERCENT: 7.3 %
MDC IDC STAT BRADY RA PERCENT PACED: 71 %

## 2016-07-10 NOTE — Progress Notes (Signed)
Remote pacemaker transmission.   

## 2016-07-14 ENCOUNTER — Encounter: Payer: Self-pay | Admitting: Cardiology

## 2016-07-30 ENCOUNTER — Telehealth: Payer: Self-pay

## 2016-07-30 ENCOUNTER — Encounter: Payer: Self-pay | Admitting: Internal Medicine

## 2016-07-30 ENCOUNTER — Ambulatory Visit (INDEPENDENT_AMBULATORY_CARE_PROVIDER_SITE_OTHER): Payer: Medicare Other | Admitting: Internal Medicine

## 2016-07-30 VITALS — BP 152/81 | HR 61 | Ht 73.0 in | Wt 217.4 lb

## 2016-07-30 DIAGNOSIS — I5022 Chronic systolic (congestive) heart failure: Secondary | ICD-10-CM | POA: Diagnosis not present

## 2016-07-30 DIAGNOSIS — I480 Paroxysmal atrial fibrillation: Secondary | ICD-10-CM

## 2016-07-30 DIAGNOSIS — I459 Conduction disorder, unspecified: Secondary | ICD-10-CM | POA: Diagnosis not present

## 2016-07-30 LAB — CUP PACEART INCLINIC DEVICE CHECK
Battery Voltage: 2.99 V
Brady Statistic RA Percent Paced: 67 %
Brady Statistic RV Percent Paced: 99.31 %
Implantable Lead Implant Date: 20171109
Implantable Lead Location: 753858
Lead Channel Impedance Value: 450 Ohm
Lead Channel Impedance Value: 737.5 Ohm
Lead Channel Pacing Threshold Amplitude: 0.5 V
Lead Channel Pacing Threshold Amplitude: 0.5 V
Lead Channel Pacing Threshold Amplitude: 0.5 V
Lead Channel Pacing Threshold Amplitude: 0.5 V
Lead Channel Pacing Threshold Amplitude: 1.25 V
Lead Channel Pacing Threshold Pulse Width: 0.5 ms
Lead Channel Pacing Threshold Pulse Width: 0.5 ms
Lead Channel Pacing Threshold Pulse Width: 0.8 ms
Lead Channel Sensing Intrinsic Amplitude: 5 mV
Lead Channel Setting Pacing Amplitude: 2 V
Lead Channel Setting Pacing Pulse Width: 0.5 ms
MDC IDC LEAD IMPLANT DT: 20171109
MDC IDC LEAD IMPLANT DT: 20171109
MDC IDC LEAD LOCATION: 753859
MDC IDC LEAD LOCATION: 753860
MDC IDC MSMT LEADCHNL LV PACING THRESHOLD AMPLITUDE: 1.25 V
MDC IDC MSMT LEADCHNL LV PACING THRESHOLD PULSEWIDTH: 0.8 ms
MDC IDC MSMT LEADCHNL RA PACING THRESHOLD PULSEWIDTH: 0.5 ms
MDC IDC MSMT LEADCHNL RV IMPEDANCE VALUE: 587.5 Ohm
MDC IDC MSMT LEADCHNL RV PACING THRESHOLD PULSEWIDTH: 0.5 ms
MDC IDC MSMT LEADCHNL RV SENSING INTR AMPL: 11 mV
MDC IDC PG IMPLANT DT: 20171109
MDC IDC PG SERIAL: 7949810
MDC IDC SESS DTM: 20180531130026
MDC IDC SET LEADCHNL LV PACING AMPLITUDE: 2 V
MDC IDC SET LEADCHNL LV PACING PULSEWIDTH: 0.8 ms
MDC IDC SET LEADCHNL RV PACING AMPLITUDE: 2.5 V
MDC IDC SET LEADCHNL RV SENSING SENSITIVITY: 4 mV

## 2016-07-30 MED ORDER — RIVAROXABAN 20 MG PO TABS: 20.0000 mg | ORAL_TABLET | Freq: Every day | ORAL | 3 refills | Status: DC

## 2016-07-30 NOTE — Progress Notes (Signed)
HPI [name] returns today for followup. He is a pleasant 83 yo man with CHB, s/p PPM insertion with a BiV device placed due to mild LV dysfunction with an EF of 45%. He has developed atrial fib but is asymptomatic from a the perspective of palpitations. He admits to dietary indiscretion and has had mild peripheral edema. No other complaints today.  Allergies  Allergen Reactions  . Ace Inhibitors Nausea Only and Other (See Comments)  . Tape Rash    And Bandaids, too     Current Outpatient Prescriptions  Medication Sig Dispense Refill  . acetaminophen (TYLENOL) 500 MG tablet Take 500 mg by mouth every 6 (six) hours as needed for pain.    . Dorzolamide HCl-Timolol Mal PF 22.3-6.8 MG/ML SOLN Place 1 drop into the left eye 2 (two) times daily.     . hydrochlorothiazide (HYDRODIURIL) 25 MG tablet TAKE ONE (1) TABLET EACH DAY 90 tablet 3  . latanoprost (XALATAN) 0.005 % ophthalmic solution Place 1 drop into both eyes at bedtime.    . lovastatin (MEVACOR) 10 MG tablet Take 1 tablet (10 mg total) by mouth daily. 30 tablet 11  . nitroGLYCERIN (NITROSTAT) 0.4 MG SL tablet Place 1 tablet (0.4 mg total) under the tongue every 5 (five) minutes x 3 doses as needed for chest pain. 25 tablet 3  . pantoprazole (PROTONIX) 40 MG tablet Take 40 mg by mouth daily.    [name] Propylene Glycol (SYSTANE BALANCE OP) Place 1 drop into both eyes 4 (four) times daily.     . rivaroxaban (XARELTO) 20 MG TABS tablet Take 1 tablet (20 mg total) by mouth daily with supper. 30 tablet 6  . vitamin B-12 (CYANOCOBALAMIN) 1000 MCG tablet Take 1,000 mcg by mouth daily.     No current facility-administered medications for this visit.      Past Medical History:  Diagnosis Date  . CAD (coronary artery disease)    status post ST elevation myocardial infarction March 2010, status post bare-metal stent x2 to the proximal and distal RCA.  Subsequent stent drug-eluting stent to the LAD in a staged fashion.,  Cardiolite  October 2010, negative for ischemia with normal ejection fraction  . Cough, persistent   . GERD (gastroesophageal reflux disease)   . History of BPH   . HTN (hypertension)   . Hypercholesteremia     ROS:   All systems reviewed and negative except as noted in the HPI.   Past Surgical History:  Procedure Laterality Date  . CORONARY STENT PLACEMENT  04/2008   RCA and LAD  . EP IMPLANTABLE DEVICE N/A 01/09/2016   Procedure: BiV Pacemaker Insertion CRT-P;  Surgeon: [name], MD;  Location: [place] [facility];  Service: Cardiovascular;  Laterality: N/A;     Family History  Problem Relation Age of Onset  . Emphysema Mother        mother died with emphysema and cancer  . Cancer Mother      Social History   Social History  . Marital status: Single    Spouse name: N/A  . Number of children: N/A  . Years of education: N/A   Occupational History  . retired     Press photographer   Social History Main Topics  . Smoking status: Former Smoker    Packs/day: 2.50    Years: 25.00    Types: Cigarettes    Quit date: 06/14/1973  . Smokeless tobacco: Former Systems developer    Types: Loss adjuster, chartered  Quit date: 06/14/1973     Comment: some/chewed very little for about 2 years  . Alcohol use No  . Drug use: No  . Sexual activity: Not on file   Other Topics Concern  . Not on file   Social History Narrative  . No narrative on file     BP (!) 152/81   Pulse 61   Ht 6\' 1"  (1.854 m)   Wt 217 lb 6.4 oz (98.6 kg)   BMI 28.68 kg/m   Physical Exam:  Well appearing 83 yo man, NAD HEENT: Unremarkable Neck:  6 cm JVD, no thyromegally Lymphatics:  No adenopathy Back:  No CVA tenderness Lungs:  Clear with no wheezes HEART:  Regular rate rhythm, no murmurs, no rubs, no clicks Abd:  soft, positive bowel sounds, no organomegally, no rebound, no guarding Ext:  2 plus pulses, no edema, no cyanosis, no clubbing Skin:  No rashes no nodules Neuro:  CN II through XII intact, motor grossly intact   DEVICE    Normal device function.  See PaceArt for details.   Assess/Plan: 1. Chronic systolic heart failure - his symptoms are class 2. I have carefully taken a diet history and while he has cut back on his salt intake, he admits that he can do a lot better. 2. PAF - his atrial fib burden is increasing. He is asymptomatic. He will continue his anti-coagulation. 3. CHB - he is asymptomatic s/p PPM insertion 4. PPM - his [employer] Jude biv PPM is working normally. Will recheck in several months.  [name]

## 2016-07-30 NOTE — Telephone Encounter (Signed)
Called, spoke with Alberteen Spindle, RN at [facility]. Butch Penny stated they need office note supporting need for med and paper Rx of Xarelto faxed to [phone]. Butch Penny verbalized understanding.   Pt has appt today at the [facility] office with Dr. Lovena Le. Called [place] office and requested office note from 04/10/16 and Rx for Xarelto to be faxed to [phone].

## 2016-07-30 NOTE — Telephone Encounter (Signed)
Office note and xarelto prescription faxed to the [facility] in [place] today.

## 2016-07-30 NOTE — Patient Instructions (Addendum)
Medication Instructions:   Your physician recommends that you continue on your current medications as directed. Please refer to the Current Medication list given to you today.  Labwork:  NONE  Testing/Procedures:  NONE  Follow-Up:  Your physician recommends that you schedule a follow-up appointment in: November 2018. You will receive a reminder letter in the mail in about 4 months reminding you to call and schedule your appointment. If you don't receive this letter, please contact our office.   Any Other Special Instructions Will Be Listed Below (If Applicable). Remote monitoring is used to monitor your Pacemaker of ICD from home. This monitoring reduces the number of office visits required to check your device to one time per year. It allows [place] to keep an eye on the functioning of your device to ensure it is working properly. You are scheduled for a device check from home on 10/29/16. You may send your transmission at any time that day. If you have a wireless device, the transmission will be sent automatically. After your physician reviews your transmission, you will receive a postcard with your next transmission date.   Your physician recommends that you follow a low salt diet. Please use the information given to you today as a guide.   If you need a refill on your cardiac medications before your next appointment, please call your pharmacy.

## 2016-10-09 ENCOUNTER — Ambulatory Visit (INDEPENDENT_AMBULATORY_CARE_PROVIDER_SITE_OTHER): Payer: Medicare Other | Admitting: *Deleted

## 2016-10-09 DIAGNOSIS — I459 Conduction disorder, unspecified: Secondary | ICD-10-CM | POA: Diagnosis not present

## 2016-10-15 NOTE — Progress Notes (Signed)
Remote PPM

## 2016-10-21 DIAGNOSIS — Z950 Presence of cardiac pacemaker: Secondary | ICD-10-CM | POA: Diagnosis not present

## 2016-10-21 DIAGNOSIS — I2510 Atherosclerotic heart disease of native coronary artery without angina pectoris: Secondary | ICD-10-CM | POA: Diagnosis not present

## 2016-10-21 DIAGNOSIS — I482 Chronic atrial fibrillation: Secondary | ICD-10-CM | POA: Diagnosis not present

## 2016-10-21 DIAGNOSIS — I10 Essential (primary) hypertension: Secondary | ICD-10-CM | POA: Diagnosis not present

## 2016-10-23 ENCOUNTER — Encounter: Payer: Self-pay | Admitting: Cardiology

## 2016-12-08 LAB — CUP PACEART REMOTE DEVICE CHECK
Battery Remaining Percentage: 95.5 %
Battery Voltage: 2.99 V
Brady Statistic AP VP Percent: 80 %
Brady Statistic AS VP Percent: 18 %
Brady Statistic RA Percent Paced: 34 %
Date Time Interrogation Session: 20180810110817
Implantable Lead Implant Date: 20171109
Implantable Lead Location: 753858
Implantable Lead Location: 753860
Lead Channel Impedance Value: 560 Ohm
Lead Channel Pacing Threshold Amplitude: 0.5 V
Lead Channel Pacing Threshold Pulse Width: 0.5 ms
Lead Channel Sensing Intrinsic Amplitude: 2.1 mV
Lead Channel Setting Pacing Amplitude: 2 V
Lead Channel Setting Pacing Amplitude: 2 V
Lead Channel Setting Pacing Pulse Width: 0.5 ms
MDC IDC LEAD IMPLANT DT: 20171109
MDC IDC LEAD IMPLANT DT: 20171109
MDC IDC LEAD LOCATION: 753859
MDC IDC MSMT BATTERY REMAINING LONGEVITY: 88 mo
MDC IDC MSMT LEADCHNL LV IMPEDANCE VALUE: 780 Ohm
MDC IDC MSMT LEADCHNL LV PACING THRESHOLD AMPLITUDE: 1.25 V
MDC IDC MSMT LEADCHNL LV PACING THRESHOLD PULSEWIDTH: 0.8 ms
MDC IDC MSMT LEADCHNL RA IMPEDANCE VALUE: 390 Ohm
MDC IDC MSMT LEADCHNL RV PACING THRESHOLD AMPLITUDE: 0.5 V
MDC IDC MSMT LEADCHNL RV PACING THRESHOLD PULSEWIDTH: 0.5 ms
MDC IDC MSMT LEADCHNL RV SENSING INTR AMPL: 10.3 mV
MDC IDC PG IMPLANT DT: 20171109
MDC IDC PG SERIAL: 7949810
MDC IDC SET LEADCHNL LV PACING PULSEWIDTH: 0.8 ms
MDC IDC SET LEADCHNL RV PACING AMPLITUDE: 2.5 V
MDC IDC SET LEADCHNL RV SENSING SENSITIVITY: 4 mV
MDC IDC STAT BRADY AP VS PERCENT: 1 %
MDC IDC STAT BRADY AS VS PERCENT: 1 %
Pulse Gen Model: 3262

## 2017-01-05 ENCOUNTER — Ambulatory Visit: Payer: Medicare Other | Admitting: Cardiovascular Disease

## 2017-01-05 ENCOUNTER — Other Ambulatory Visit: Payer: Self-pay | Admitting: Cardiovascular Disease

## 2017-01-05 ENCOUNTER — Telehealth: Payer: Self-pay | Admitting: Cardiovascular Disease

## 2017-01-05 ENCOUNTER — Encounter: Payer: Self-pay | Admitting: Cardiovascular Disease

## 2017-01-05 ENCOUNTER — Encounter: Payer: Self-pay | Admitting: *Deleted

## 2017-01-05 VITALS — BP 138/64 | HR 64 | Ht 73.0 in | Wt 217.0 lb

## 2017-01-05 DIAGNOSIS — E7800 Pure hypercholesterolemia, unspecified: Secondary | ICD-10-CM

## 2017-01-05 DIAGNOSIS — Z950 Presence of cardiac pacemaker: Secondary | ICD-10-CM

## 2017-01-05 DIAGNOSIS — I209 Angina pectoris, unspecified: Secondary | ICD-10-CM | POA: Diagnosis not present

## 2017-01-05 DIAGNOSIS — I10 Essential (primary) hypertension: Secondary | ICD-10-CM | POA: Diagnosis not present

## 2017-01-05 DIAGNOSIS — I5022 Chronic systolic (congestive) heart failure: Secondary | ICD-10-CM | POA: Diagnosis not present

## 2017-01-05 DIAGNOSIS — Z23 Encounter for immunization: Secondary | ICD-10-CM

## 2017-01-05 DIAGNOSIS — I480 Paroxysmal atrial fibrillation: Secondary | ICD-10-CM

## 2017-01-05 DIAGNOSIS — I25709 Atherosclerosis of coronary artery bypass graft(s), unspecified, with unspecified angina pectoris: Secondary | ICD-10-CM

## 2017-01-05 NOTE — Patient Instructions (Signed)
Medication Instructions:  Continue all current medications.  Labwork: none  Testing/Procedures:  Your physician has requested that you have a lexiscan myoview. For further information please visit [email or web address]. Please follow instruction sheet, as given.  Office will contact with results via phone or letter.    Follow-Up: 6 weeks   Any Other Special Instructions Will Be Listed Below (If Applicable).  If you need a refill on your cardiac medications before your next appointment, please call your pharmacy.

## 2017-01-05 NOTE — Telephone Encounter (Signed)
lexiscan - angina pectoris Schedule at [facility] Nov 15,2018

## 2017-01-05 NOTE — Progress Notes (Signed)
SUBJECTIVE: Patient presents for routine follow-up.  He has a history of coronary artery disease with 2 stents to the RCA and one to the LAD.  The LAD stent was placed in March 2010, which was his most recent cardiac catheterization as well.  He also has a biventricular pacemaker, chronic systolic heart failure, and paroxysmal atrial fibrillation.  Echocardiogram 01/07/16: Mildly reduced left ventricular systolic function, LVEF 29-47%, mild LVH, grade 2 diastolic dysfunction with elevated filling pressures, mild aortic regurgitation.  For the past 1 month, he has been experiencing some chest discomfort when he exerts himself with activities such as raking leaves.  He has had some associated shortness of breath.  He denies bleeding problems with Xarelto.  Review of Systems: As per "subjective", otherwise negative.  Allergies  Allergen Reactions  . Ace Inhibitors Nausea Only and Other (See Comments)  . Tape Rash    And Bandaids, too    Current Outpatient Medications  Medication Sig Dispense Refill  . Dorzolamide HCl-Timolol Mal PF 22.3-6.8 MG/ML SOLN Place 1 drop into the left eye 2 (two) times daily.     . hydrochlorothiazide (HYDRODIURIL) 25 MG tablet TAKE ONE (1) TABLET EACH DAY 90 tablet 3  . latanoprost (XALATAN) 0.005 % ophthalmic solution Place 1 drop into both eyes at bedtime.    . lovastatin (MEVACOR) 10 MG tablet Take 1 tablet (10 mg total) by mouth daily. 30 tablet 11  . nitroGLYCERIN (NITROSTAT) 0.4 MG SL tablet Place 1 tablet (0.4 mg total) under the tongue every 5 (five) minutes x 3 doses as needed for chest pain. 25 tablet 3  . pantoprazole (PROTONIX) 40 MG tablet Take 40 mg by mouth daily.    [name] Propylene Glycol (SYSTANE BALANCE OP) Place 1 drop into both eyes 4 (four) times daily.     . rivaroxaban (XARELTO) 20 MG TABS tablet Take 1 tablet (20 mg total) by mouth daily with supper. Dx: paroxysmal atrial fibrillation 90 tablet 3  . tamsulosin (FLOMAX) 0.4 MG CAPS  capsule Take 0.4 mg by mouth.    . vitamin B-12 (CYANOCOBALAMIN) 1000 MCG tablet Take 1,000 mcg by mouth daily.    [name] acetaminophen (TYLENOL) 500 MG tablet Take 500 mg by mouth every 6 (six) hours as needed for pain.     No current facility-administered medications for this visit.     Past Medical History:  Diagnosis Date  . CAD (coronary artery disease)    status post ST elevation myocardial infarction March 2010, status post bare-metal stent x2 to the proximal and distal RCA.  Subsequent stent drug-eluting stent to the LAD in a staged fashion.,  Cardiolite October 2010, negative for ischemia with normal ejection fraction  . Cough, persistent   . GERD (gastroesophageal reflux disease)   . History of BPH   . HTN (hypertension)   . Hypercholesteremia     Past Surgical History:  Procedure Laterality Date  . CORONARY STENT PLACEMENT  04/2008   RCA and LAD    Social History   Socioeconomic History  . Marital status: Single    Spouse name: Not on file  . Number of children: Not on file  . Years of education: Not on file  . Highest education level: Not on file  Social Needs  . Financial resource strain: Not on file  . Food insecurity - worry: Not on file  . Food insecurity - inability: Not on file  . Transportation needs - medical: Not on file  . Transportation  needs - non-medical: Not on file  Occupational History  . Occupation: retired    Comment: Press photographer  Tobacco Use  . Smoking status: Former Smoker    Packs/day: 2.50    Years: 25.00    Pack years: 62.50    Types: Cigarettes    Last attempt to quit: 06/14/1973    Years since quitting: 43.5  . Smokeless tobacco: Former Systems developer    Types: Chew    Quit date: 06/14/1973  . Tobacco comment: some/chewed very little for about 2 years  Substance and Sexual Activity  . Alcohol use: No  . Drug use: No  . Sexual activity: Not on file  Other Topics Concern  . Not on file  Social History Narrative  . Not on file     Vitals:    01/05/17 1249  BP: 138/64  Pulse: 64  SpO2: 97%  Weight: 217 lb (98.4 kg)  Height: 6\' 1"  (1.854 m)    Wt Readings from Last 3 Encounters:  01/05/17 217 lb (98.4 kg)  07/30/16 217 lb 6.4 oz (98.6 kg)  04/10/16 220 lb 12.8 oz (100.2 kg)     PHYSICAL EXAM General: NAD HEENT: Normal. Neck: No JVD, no thyromegaly. Lungs: Clear to auscultation bilaterally with normal respiratory effort. CV: Regular rate and rhythm, normal S1/S2, no S3/S4, no murmur. No pretibial or periankle edema.  No carotid bruit.   Abdomen: Soft, nontender, no distention.  Neurologic: Alert and oriented.  Psych: Normal affect. Skin: Normal. Musculoskeletal: No gross deformities.    ECG: Most recent ECG reviewed.   Labs: Lab Results  Component Value Date/Time   K 4.2 01/08/2016 03:23 PM   BUN 10 01/08/2016 03:23 PM   CREATININE 1.17 01/08/2016 03:23 PM   CREATININE 1.16 (H) 01/07/2016 07:57 AM   ALT 18 03/06/2014 09:03 AM   HGB 14.0 01/08/2016 03:23 PM     Lipids: Lab Results  Component Value Date/Time   LDLCALC 70 03/06/2014 09:03 AM   CHOL 123 03/06/2014 09:03 AM   TRIG 117 03/06/2014 09:03 AM   HDL 30 (L) 03/06/2014 09:03 AM       ASSESSMENT AND PLAN: 1.  Coronary artery disease with 2 stents to the RCA and one stent to the LAD with exertional angina: On lovastatin.  Presumably not on aspirin as he is on Xarelto.  I will obtain a Lexiscan Myoview stress test.  2. Chronic systolic heart failure with Biventricular pacemaker: Euvolemic and stable.  No indication for diuretics at this time.  Blood pressure is controlled.  3.  Paroxysmal atrial fibrillation: Symptomatically stable.  Anticoagulated with Xarelto.  4.  Chronic hypertension: Blood pressure is controlled.  5.  Hyperlipidemia: Tolerating lovastatin 10 mg.  I will obtain a copy of lipids from PCP.    Disposition: Follow up 6 weeks  Kate Sable, M.D., F.A.C.C.

## 2017-01-06 NOTE — Telephone Encounter (Signed)
Rescheduled per patient request to Mar 04, 2017

## 2017-01-07 ENCOUNTER — Encounter: Payer: Self-pay | Admitting: *Deleted

## 2017-01-14 ENCOUNTER — Encounter: Payer: Self-pay | Admitting: *Deleted

## 2017-01-14 ENCOUNTER — Other Ambulatory Visit: Payer: Self-pay | Admitting: *Deleted

## 2017-01-14 ENCOUNTER — Other Ambulatory Visit (HOSPITAL_COMMUNITY): Payer: Medicare Other

## 2017-01-14 ENCOUNTER — Encounter (HOSPITAL_COMMUNITY): Payer: Medicare Other

## 2017-01-14 DIAGNOSIS — E7800 Pure hypercholesterolemia, unspecified: Secondary | ICD-10-CM

## 2017-01-15 ENCOUNTER — Telehealth: Payer: Self-pay | Admitting: Cardiovascular Disease

## 2017-01-15 NOTE — Telephone Encounter (Signed)
Patient had question about his next device check.  Explained to him that his next check is remote (do from home) and due 01/19/2017.  Patient verbalized understanding.  He was concerned about having to pay for another visit as he will not of gotten his check yet to be able to afford the visit.

## 2017-01-15 NOTE — Telephone Encounter (Signed)
Attempted to return call - lmtcb.

## 2017-01-15 NOTE — Telephone Encounter (Signed)
Mr. Parekh called requesting to speak with Dr. Court Joy office.  Would not leave a message.  Please call [phone].

## 2017-01-19 ENCOUNTER — Ambulatory Visit (INDEPENDENT_AMBULATORY_CARE_PROVIDER_SITE_OTHER): Payer: Medicare Other | Admitting: *Deleted

## 2017-01-19 DIAGNOSIS — I442 Atrioventricular block, complete: Secondary | ICD-10-CM

## 2017-01-20 LAB — CUP PACEART REMOTE DEVICE CHECK
Battery Remaining Longevity: 86 mo
Brady Statistic AP VP Percent: 74 %
Brady Statistic AS VP Percent: 25 %
Brady Statistic RA Percent Paced: 34 %
Date Time Interrogation Session: 20181120140032
Implantable Lead Implant Date: 20171109
Implantable Lead Location: 753860
Lead Channel Impedance Value: 410 Ohm
Lead Channel Pacing Threshold Amplitude: 0.5 V
Lead Channel Pacing Threshold Amplitude: 1.25 V
Lead Channel Pacing Threshold Pulse Width: 0.8 ms
Lead Channel Sensing Intrinsic Amplitude: 12 mV
Lead Channel Setting Pacing Amplitude: 2 V
Lead Channel Setting Pacing Pulse Width: 0.5 ms
Lead Channel Setting Sensing Sensitivity: 4 mV
MDC IDC LEAD IMPLANT DT: 20171109
MDC IDC LEAD IMPLANT DT: 20171109
MDC IDC LEAD LOCATION: 753858
MDC IDC LEAD LOCATION: 753859
MDC IDC MSMT BATTERY REMAINING PERCENTAGE: 95.5 %
MDC IDC MSMT BATTERY VOLTAGE: 2.99 V
MDC IDC MSMT LEADCHNL LV IMPEDANCE VALUE: 680 Ohm
MDC IDC MSMT LEADCHNL RA PACING THRESHOLD AMPLITUDE: 0.5 V
MDC IDC MSMT LEADCHNL RA PACING THRESHOLD PULSEWIDTH: 0.5 ms
MDC IDC MSMT LEADCHNL RA SENSING INTR AMPL: 2.1 mV
MDC IDC MSMT LEADCHNL RV IMPEDANCE VALUE: 540 Ohm
MDC IDC MSMT LEADCHNL RV PACING THRESHOLD PULSEWIDTH: 0.5 ms
MDC IDC PG IMPLANT DT: 20171109
MDC IDC SET LEADCHNL LV PACING AMPLITUDE: 2 V
MDC IDC SET LEADCHNL LV PACING PULSEWIDTH: 0.8 ms
MDC IDC SET LEADCHNL RV PACING AMPLITUDE: 2.5 V
MDC IDC STAT BRADY AP VS PERCENT: 1 %
MDC IDC STAT BRADY AS VS PERCENT: 1 %
Pulse Gen Model: 3262
Pulse Gen Serial Number: 7949810

## 2017-01-20 NOTE — Progress Notes (Signed)
Remote pacemaker transmission.   

## 2017-01-28 ENCOUNTER — Encounter: Payer: Self-pay | Admitting: Cardiology

## 2017-02-12 ENCOUNTER — Encounter: Payer: Self-pay | Admitting: *Deleted

## 2017-02-18 DIAGNOSIS — H353232 Exudative age-related macular degeneration, bilateral, with inactive choroidal neovascularization: Secondary | ICD-10-CM | POA: Diagnosis not present

## 2017-02-18 DIAGNOSIS — H35372 Puckering of macula, left eye: Secondary | ICD-10-CM | POA: Diagnosis not present

## 2017-02-19 ENCOUNTER — Other Ambulatory Visit: Payer: Self-pay | Admitting: Cardiovascular Disease

## 2017-03-01 DIAGNOSIS — E7800 Pure hypercholesterolemia, unspecified: Secondary | ICD-10-CM | POA: Diagnosis not present

## 2017-03-01 LAB — LIPID PANEL
CHOLESTEROL: 104 mg/dL (ref <200)
HDL: 33 mg/dL — AB (ref >40)
LDL CHOLESTEROL (CALC): 58 mg/dL
Non-HDL Cholesterol (Calc): 71 mg/dL (calc) (ref <130)
TRIGLYCERIDES: 54 mg/dL (ref <150)
Total CHOL/HDL Ratio: 3.2 (calc) (ref <5.0)

## 2017-03-04 ENCOUNTER — Encounter (HOSPITAL_COMMUNITY): Payer: Self-pay

## 2017-03-04 ENCOUNTER — Encounter (HOSPITAL_BASED_OUTPATIENT_CLINIC_OR_DEPARTMENT_OTHER)
Admission: RE | Admit: 2017-03-04 | Discharge: 2017-03-04 | Disposition: A | Discharge status: Home / Self Care | Payer: Medicare Other | Source: Ambulatory Visit | Attending: Cardiovascular Disease | Admitting: Cardiovascular Disease

## 2017-03-04 ENCOUNTER — Encounter (HOSPITAL_COMMUNITY)
Admission: RE | Admit: 2017-03-04 | Discharge: 2017-03-04 | Disposition: A | Discharge status: Home / Self Care | Payer: Medicare Other | Source: Ambulatory Visit | Attending: Cardiovascular Disease | Admitting: Cardiovascular Disease

## 2017-03-04 DIAGNOSIS — I209 Angina pectoris, unspecified: Secondary | ICD-10-CM | POA: Insufficient documentation

## 2017-03-04 LAB — NM MYOCAR MULTI W/SPECT W/WALL MOTION / EF
CSEPPHR: 74 {beats}/min
LVDIAVOL: 162 mL (ref 62–150)
LVSYSVOL: 87 mL
RATE: 0.36
Rest HR: 70 {beats}/min
SDS: 3
SRS: 4
SSS: 7
TID: 1.1

## 2017-03-04 MED ORDER — SODIUM CHLORIDE 0.9% FLUSH
INTRAVENOUS | Status: AC
  Administered 2017-03-04: 10 mL via INTRAVENOUS
  Filled 2017-03-04: qty 10

## 2017-03-04 MED ORDER — TECHNETIUM TC 99M TETROFOSMIN IV KIT
30.0000 | PACK | Freq: Once | INTRAVENOUS | Status: AC | PRN
  Administered 2017-03-04: 32 via INTRAVENOUS

## 2017-03-04 MED ORDER — TECHNETIUM TC 99M TETROFOSMIN IV KIT
10.0000 | PACK | Freq: Once | INTRAVENOUS | Status: AC | PRN
  Administered 2017-03-04: 11 via INTRAVENOUS

## 2017-03-04 MED ORDER — REGADENOSON 0.4 MG/5ML IV SOLN
INTRAVENOUS | Status: AC
  Administered 2017-03-04: 0.4 mg via INTRAVENOUS
  Filled 2017-03-04: qty 5

## 2017-03-09 ENCOUNTER — Encounter: Payer: Self-pay | Admitting: Cardiovascular Disease

## 2017-03-09 ENCOUNTER — Ambulatory Visit: Payer: Medicare Other | Admitting: Cardiovascular Disease

## 2017-03-09 VITALS — BP 118/74 | HR 68 | Ht 73.0 in | Wt 214.0 lb

## 2017-03-09 DIAGNOSIS — Z950 Presence of cardiac pacemaker: Secondary | ICD-10-CM

## 2017-03-09 DIAGNOSIS — E7800 Pure hypercholesterolemia, unspecified: Secondary | ICD-10-CM

## 2017-03-09 DIAGNOSIS — I480 Paroxysmal atrial fibrillation: Secondary | ICD-10-CM | POA: Diagnosis not present

## 2017-03-09 DIAGNOSIS — I25708 Atherosclerosis of coronary artery bypass graft(s), unspecified, with other forms of angina pectoris: Secondary | ICD-10-CM | POA: Diagnosis not present

## 2017-03-09 DIAGNOSIS — I209 Angina pectoris, unspecified: Secondary | ICD-10-CM | POA: Diagnosis not present

## 2017-03-09 DIAGNOSIS — I10 Essential (primary) hypertension: Secondary | ICD-10-CM | POA: Diagnosis not present

## 2017-03-09 DIAGNOSIS — I5022 Chronic systolic (congestive) heart failure: Secondary | ICD-10-CM | POA: Diagnosis not present

## 2017-03-09 NOTE — Progress Notes (Signed)
SUBJECTIVE: The patient returns for follow-up after undergoing cardiovascular testing performed for the evaluation of chest pain.  Nuclear stress test performed on 03/04/17 demonstrated a large prior inferior myocardial infarction with no evidence of ischemia.  He has a history of coronary artery disease with 2 stents to the RCA and one to the LAD.  The LAD stent was placed in March 2010, which was his most recent cardiac catheterization as well.  He also has a biventricular pacemaker, chronic systolic heart failure, and paroxysmal atrial fibrillation.  Echocardiogram 01/07/16: Mildly reduced left ventricular systolic function, LVEF 82-50%, mild LVH, grade 2 diastolic dysfunction with elevated filling pressures, mild aortic regurgitation.  He has been doing well since I last saw him and denies any chest pain, leg swelling, palpitations, shortness of breath.  He has not been outside doing his routine activities due to the weather.  He was recently diagnosed with mild anemia.  I reviewed labs performed on 02/25/17 which showed hemoglobin 11, platelets 231, BUN 20, creatinine 1.21.  He is being scheduled for colonoscopy by his PCP.   Review of Systems: As per "subjective", otherwise negative.  Allergies  Allergen Reactions  . Ace Inhibitors Nausea Only and Other (See Comments)  . Tape Rash    And Bandaids, too    Current Outpatient Medications  Medication Sig Dispense Refill  . acetaminophen (TYLENOL) 500 MG tablet Take 500 mg by mouth every 6 (six) hours as needed for pain.    . ciprofloxacin (CIPRO) 500 MG tablet Take 500 mg by mouth 2 (two) times daily.    . Dorzolamide HCl-Timolol Mal PF 22.3-6.8 MG/ML SOLN Place 1 drop into the left eye 2 (two) times daily.     . hydrochlorothiazide (HYDRODIURIL) 25 MG tablet TAKE ONE (1) TABLET EACH DAY 90 tablet 3  . latanoprost (XALATAN) 0.005 % ophthalmic solution Place 1 drop into both eyes at bedtime.    . lovastatin (MEVACOR) 10 MG  tablet TAKE ONE (1) TABLET BY MOUTH EVERY DAY 30 tablet 11  . nitroGLYCERIN (NITROSTAT) 0.4 MG SL tablet Place 1 tablet (0.4 mg total) under the tongue every 5 (five) minutes x 3 doses as needed for chest pain. 25 tablet 3  . nystatin cream (MYCOSTATIN) Apply 1 application topically 2 (two) times daily.    . pantoprazole (PROTONIX) 40 MG tablet Take 40 mg by mouth daily.    [name] Propylene Glycol (SYSTANE BALANCE OP) Place 1 drop into both eyes 4 (four) times daily.     . rivaroxaban (XARELTO) 20 MG TABS tablet Take 1 tablet (20 mg total) by mouth daily with supper. Dx: paroxysmal atrial fibrillation 90 tablet 3  . tamsulosin (FLOMAX) 0.4 MG CAPS capsule Take 0.4 mg by mouth.    . vitamin B-12 (CYANOCOBALAMIN) 1000 MCG tablet Take 1,000 mcg by mouth daily.     No current facility-administered medications for this visit.     Past Medical History:  Diagnosis Date  . CAD (coronary artery disease)    status post ST elevation myocardial infarction March 2010, status post bare-metal stent x2 to the proximal and distal RCA.  Subsequent stent drug-eluting stent to the LAD in a staged fashion.,  Cardiolite October 2010, negative for ischemia with normal ejection fraction  . Cough, persistent   . GERD (gastroesophageal reflux disease)   . History of BPH   . HTN (hypertension)   . Hypercholesteremia     Past Surgical History:  Procedure Laterality Date  . CORONARY STENT  PLACEMENT  04/2008   RCA and LAD  . EP IMPLANTABLE DEVICE N/A 01/09/2016   Procedure: BiV Pacemaker Insertion CRT-P;  Surgeon: [name], MD;  Location: [facility];  Service: Cardiovascular;  Laterality: N/A;    Social History   Socioeconomic History  . Marital status: Married    Spouse name: Not on file  . Number of children: Not on file  . Years of education: Not on file  . Highest education level: Not on file  Social Needs  . Financial resource strain: Not on file  . Food insecurity - worry: Not on file  .  Food insecurity - inability: Not on file  . Transportation needs - medical: Not on file  . Transportation needs - non-medical: Not on file  Occupational History  . Occupation: retired    Comment: Press photographer  Tobacco Use  . Smoking status: Former Smoker    Packs/day: 2.50    Years: 25.00    Pack years: 62.50    Types: Cigarettes    Last attempt to quit: 06/14/1973    Years since quitting: 43.7  . Smokeless tobacco: Former Systems developer    Types: Chew    Quit date: 06/14/1973  . Tobacco comment: some/chewed very little for about 2 years  Substance and Sexual Activity  . Alcohol use: No  . Drug use: No  . Sexual activity: Not on file  Other Topics Concern  . Not on file  Social History Narrative  . Not on file     Vitals:   03/09/17 1032  BP: 118/74  Pulse: 68  SpO2: 97%  Weight: 214 lb (97.1 kg)  Height: 6\' 1"  (1.854 m)    Wt Readings from Last 3 Encounters:  03/09/17 214 lb (97.1 kg)  01/05/17 217 lb (98.4 kg)  07/30/16 217 lb 6.4 oz (98.6 kg)     PHYSICAL EXAM General: NAD HEENT: Normal. Neck: No JVD, no thyromegaly. Lungs: Clear to auscultation bilaterally with normal respiratory effort. CV: Regular rate and rhythm, normal S1/S2, no S3/S4, no murmur. No pretibial or periankle edema.  No carotid bruit.   Abdomen: Soft, nontender, no distention.  Neurologic: Alert and oriented.  Psych: Normal affect. Skin: Normal. Musculoskeletal: No gross deformities.    ECG: Most recent ECG reviewed.   Labs: Lab Results  Component Value Date/Time   K 4.2 01/08/2016 03:23 PM   BUN 10 01/08/2016 03:23 PM   CREATININE 1.17 01/08/2016 03:23 PM   CREATININE 1.16 (H) 01/07/2016 07:57 AM   ALT 18 03/06/2014 09:03 AM   HGB 14.0 01/08/2016 03:23 PM     Lipids: Lab Results  Component Value Date/Time   LDLCALC 70 03/06/2014 09:03 AM   CHOL 104 03/01/2017 10:04 AM   TRIG 54 03/01/2017 10:04 AM   HDL 33 (L) 03/01/2017 10:04 AM       ASSESSMENT AND PLAN: 1.  Coronary artery  disease with 2 stents to the RCA and one stent to the LAD with exertional angina:  Symptomatically stable.  On lovastatin.  Presumably not on aspirin as he is on Xarelto.  Lexiscan Myoview stress test detailed above demonstrated a large area of myocardial scar with no evidence of ischemia.  No recurrence of symptoms.  No changes to therapy at this time.  2. Chronic systolic heart failure with Biventricular pacemaker: Euvolemic and stable.  No indication for diuretics at this time.  Blood pressure is controlled.  3.  Paroxysmal atrial fibrillation: Symptomatically stable.  Anticoagulated with Xarelto.  4.  Chronic hypertension: Blood pressure is controlled.  5.  Hyperlipidemia: Tolerating lovastatin 10 mg.       Disposition: Follow up 6 months.   Kate Sable, M.D., F.A.C.C.

## 2017-03-09 NOTE — Patient Instructions (Signed)

## 2017-04-20 ENCOUNTER — Ambulatory Visit (INDEPENDENT_AMBULATORY_CARE_PROVIDER_SITE_OTHER): Payer: Medicare Other | Admitting: *Deleted

## 2017-04-20 DIAGNOSIS — Z950 Presence of cardiac pacemaker: Secondary | ICD-10-CM | POA: Diagnosis not present

## 2017-04-20 DIAGNOSIS — I482 Chronic atrial fibrillation: Secondary | ICD-10-CM | POA: Diagnosis not present

## 2017-04-20 DIAGNOSIS — I10 Essential (primary) hypertension: Secondary | ICD-10-CM | POA: Diagnosis not present

## 2017-04-20 DIAGNOSIS — I442 Atrioventricular block, complete: Secondary | ICD-10-CM | POA: Diagnosis not present

## 2017-04-20 DIAGNOSIS — I2510 Atherosclerotic heart disease of native coronary artery without angina pectoris: Secondary | ICD-10-CM | POA: Diagnosis not present

## 2017-04-20 NOTE — Progress Notes (Signed)
Remote pacemaker transmission.   

## 2017-04-21 LAB — CUP PACEART REMOTE DEVICE CHECK
Battery Remaining Longevity: 84 mo
Battery Remaining Percentage: 95.5 %
Battery Voltage: 2.99 V
Brady Statistic AP VP Percent: 78 %
Brady Statistic RA Percent Paced: 36 %
Date Time Interrogation Session: 20190219164147
Implantable Lead Implant Date: 20171109
Implantable Lead Implant Date: 20171109
Implantable Lead Location: 753859
Implantable Pulse Generator Implant Date: 20171109
Lead Channel Impedance Value: 410 Ohm
Lead Channel Pacing Threshold Amplitude: 0.5 V
Lead Channel Pacing Threshold Amplitude: 1.25 V
Lead Channel Pacing Threshold Pulse Width: 0.5 ms
Lead Channel Sensing Intrinsic Amplitude: 1.5 mV
Lead Channel Setting Pacing Amplitude: 2 V
Lead Channel Setting Pacing Amplitude: 2.5 V
Lead Channel Setting Pacing Pulse Width: 0.8 ms
Lead Channel Setting Sensing Sensitivity: 4 mV
MDC IDC LEAD IMPLANT DT: 20171109
MDC IDC LEAD LOCATION: 753858
MDC IDC LEAD LOCATION: 753860
MDC IDC MSMT LEADCHNL LV IMPEDANCE VALUE: 590 Ohm
MDC IDC MSMT LEADCHNL LV PACING THRESHOLD PULSEWIDTH: 0.8 ms
MDC IDC MSMT LEADCHNL RV IMPEDANCE VALUE: 510 Ohm
MDC IDC MSMT LEADCHNL RV PACING THRESHOLD AMPLITUDE: 0.5 V
MDC IDC MSMT LEADCHNL RV PACING THRESHOLD PULSEWIDTH: 0.5 ms
MDC IDC MSMT LEADCHNL RV SENSING INTR AMPL: 12 mV
MDC IDC SET LEADCHNL RA PACING AMPLITUDE: 2 V
MDC IDC SET LEADCHNL RV PACING PULSEWIDTH: 0.5 ms
MDC IDC STAT BRADY AP VS PERCENT: 1 %
MDC IDC STAT BRADY AS VP PERCENT: 21 %
MDC IDC STAT BRADY AS VS PERCENT: 1 %
Pulse Gen Model: 3262
Pulse Gen Serial Number: 7949810

## 2017-04-22 ENCOUNTER — Encounter: Payer: Self-pay | Admitting: Cardiology

## 2017-06-25 ENCOUNTER — Telehealth: Payer: Self-pay | Admitting: Cardiovascular Disease

## 2017-06-25 NOTE — Telephone Encounter (Signed)
Patient called requesting an appointment for worsening fatigue, sob and intermittent chest discomfort rated 7/10 with activity. No c/o dizziness or n/v. No active chest pain. Offered appt for today at 2:30 pm with Ahmed Prima but patient declined saying that he couldn't go out in the bad storms they are calling for today. Appt scheduled for 07/15/17 and advised patient that if his symptoms get worse, that he needed to go to the ED for an evaluation.

## 2017-06-25 NOTE — Telephone Encounter (Signed)
Patient called stating that he needs to speak with Dr. Bronson Ing concerning his recent stress test.  States that he continues to have shortness of breath since the test.  Please call [phone].

## 2017-06-29 ENCOUNTER — Other Ambulatory Visit: Payer: Self-pay

## 2017-06-29 ENCOUNTER — Encounter (HOSPITAL_COMMUNITY): Payer: Self-pay | Admitting: Emergency Medicine

## 2017-06-29 ENCOUNTER — Emergency Department (HOSPITAL_COMMUNITY): Payer: Non-veteran care

## 2017-06-29 ENCOUNTER — Emergency Department (HOSPITAL_COMMUNITY)
Admission: EM | Admit: 2017-06-29 | Discharge: 2017-06-29 | Disposition: A | Discharge status: Home / Self Care | Payer: Non-veteran care | Attending: Emergency Medicine | Admitting: Emergency Medicine

## 2017-06-29 DIAGNOSIS — I2510 Atherosclerotic heart disease of native coronary artery without angina pectoris: Secondary | ICD-10-CM | POA: Insufficient documentation

## 2017-06-29 DIAGNOSIS — I10 Essential (primary) hypertension: Secondary | ICD-10-CM | POA: Insufficient documentation

## 2017-06-29 DIAGNOSIS — D649 Anemia, unspecified: Secondary | ICD-10-CM | POA: Insufficient documentation

## 2017-06-29 DIAGNOSIS — Z955 Presence of coronary angioplasty implant and graft: Secondary | ICD-10-CM | POA: Diagnosis not present

## 2017-06-29 DIAGNOSIS — R0602 Shortness of breath: Secondary | ICD-10-CM | POA: Diagnosis not present

## 2017-06-29 DIAGNOSIS — Z87891 Personal history of nicotine dependence: Secondary | ICD-10-CM | POA: Insufficient documentation

## 2017-06-29 DIAGNOSIS — J189 Pneumonia, unspecified organism: Secondary | ICD-10-CM | POA: Diagnosis not present

## 2017-06-29 DIAGNOSIS — Z79899 Other long term (current) drug therapy: Secondary | ICD-10-CM | POA: Insufficient documentation

## 2017-06-29 DIAGNOSIS — R05 Cough: Secondary | ICD-10-CM | POA: Diagnosis not present

## 2017-06-29 LAB — CBC
HEMATOCRIT: 27.9 % — AB (ref 39.0–52.0)
Hemoglobin: 7.9 g/dL — ABNORMAL LOW (ref 13.0–17.0)
MCH: 22.6 pg — ABNORMAL LOW (ref 26.0–34.0)
MCHC: 28.3 g/dL — AB (ref 30.0–36.0)
MCV: 79.9 fL (ref 78.0–100.0)
Platelets: 186 10*3/uL (ref 150–400)
RBC: 3.49 MIL/uL — ABNORMAL LOW (ref 4.22–5.81)
RDW: 17.5 % — AB (ref 11.5–15.5)
WBC: 6 10*3/uL (ref 4.0–10.5)

## 2017-06-29 LAB — COMPREHENSIVE METABOLIC PANEL
ALT: 9 U/L — ABNORMAL LOW (ref 17–63)
ANION GAP: 8 (ref 5–15)
AST: 15 U/L (ref 15–41)
Albumin: 3.9 g/dL (ref 3.5–5.0)
Alkaline Phosphatase: 45 U/L (ref 38–126)
BILIRUBIN TOTAL: 1 mg/dL (ref 0.3–1.2)
BUN: 13 mg/dL (ref 6–20)
CO2: 26 mmol/L (ref 22–32)
Calcium: 8.7 mg/dL — ABNORMAL LOW (ref 8.9–10.3)
Chloride: 99 mmol/L — ABNORMAL LOW (ref 101–111)
Creatinine, Ser: 1.1 mg/dL (ref 0.61–1.24)
GFR calc Af Amer: >60 mL/min (ref >60)
Glucose, Bld: 106 mg/dL — ABNORMAL HIGH (ref 65–99)
POTASSIUM: 3.3 mmol/L — AB (ref 3.5–5.1)
Sodium: 133 mmol/L — ABNORMAL LOW (ref 135–145)
TOTAL PROTEIN: 7 g/dL (ref 6.5–8.1)

## 2017-06-29 LAB — BRAIN NATRIURETIC PEPTIDE: B Natriuretic Peptide: 349 pg/mL — ABNORMAL HIGH (ref 0.0–100.0)

## 2017-06-29 LAB — TROPONIN I: TROPONIN I: 0.05 ng/mL — AB (ref <0.03)

## 2017-06-29 MED ORDER — AMOXICILLIN-POT CLAVULANATE 875-125 MG PO TABS: 1.0000 | ORAL_TABLET | Freq: Two times a day (BID) | ORAL | 0 refills | Status: DC

## 2017-06-29 MED ORDER — FUROSEMIDE 40 MG PO TABS
40.0000 mg | ORAL_TABLET | Freq: Once | ORAL | Status: AC
  Administered 2017-06-29: 40 mg via ORAL
  Filled 2017-06-29: qty 1

## 2017-06-29 MED ORDER — AZITHROMYCIN 250 MG PO TABS
500.0000 mg | ORAL_TABLET | Freq: Once | ORAL | Status: AC
  Administered 2017-06-29: 500 mg via ORAL
  Filled 2017-06-29: qty 2

## 2017-06-29 MED ORDER — SODIUM CHLORIDE 0.9 % IV SOLN
1.0000 g | Freq: Once | INTRAVENOUS | Status: AC
  Administered 2017-06-29: 1 g via INTRAVENOUS
  Filled 2017-06-29: qty 10

## 2017-06-29 MED ORDER — AZITHROMYCIN 250 MG PO TABS: 250.0000 mg | ORAL_TABLET | Freq: Every day | ORAL | 0 refills | Status: DC

## 2017-06-29 NOTE — ED Triage Notes (Signed)
Onset 2 weeks cough, increased SOB,  Went to  [place] clinic yesterday,  They called him this morning at told him to come to ED as soon as possible. EKG was normal, told he labs were abnormal.

## 2017-06-29 NOTE — ED Notes (Signed)
CRITICAL VALUE ALERT  Critical Value:  Troponin 0.05  Date & Time Notied:  06/29/2017 2124  Provider Notified: [email or web address]  Orders Received/Actions taken: md notified

## 2017-06-29 NOTE — ED Provider Notes (Signed)
[facility] EMERGENCY DEPARTMENT Provider Note   CSN: [id number] Arrival date & time: 06/29/17  1756     History   Chief Complaint Chief Complaint  Patient presents with  . abnormal labs    HPI Nicholas Sanchez is a 84 y.o. male.  HPI  Nicholas Sanchez is an 84 year old male, he is very pleasant gentleman who presents with a complaint of coughing and shortness of breath as well as generalized weakness.  The patient is known to have coronary disease status post myocardial infarction, he has a distal right coronary artery bare stent.  He has a drug-eluting stent in the left anterior descending as well.  He had a stress test in January which was remarkable for prior myocardial infarction but with no acute findings to suggest acute ischemia.  Since that time the patient complains that he has had shortness of breath, shortness of breath with exertion, fatigue, the symptoms have been rather chronic and constant but has noted recently that he has had increased swelling in his legs.  He actually went to the [place] clinic yesterday, blood work was drawn, the patient was told they thought he had fluid on his lungs and today they called him with his blood work saying that he had anemia and possibly an elevated BNP.  This is per the patient's report.  Past Medical History:  Diagnosis Date  . CAD (coronary artery disease)    status post ST elevation myocardial infarction March 2010, status post bare-metal stent x2 to the proximal and distal RCA.  Subsequent stent drug-eluting stent to the LAD in a staged fashion.,  Cardiolite October 2010, negative for ischemia with normal ejection fraction  . Cough, persistent   . GERD (gastroesophageal reflux disease)   . History of BPH   . HTN (hypertension)   . Hypercholesteremia     Patient Active Problem List   Diagnosis Date Noted  . Paroxysmal atrial fibrillation ([place]) 01/21/2016  . Heart block 01/08/2016  . CAD (coronary artery disease)   . HTN  (hypertension)   . Hypercholesteremia   . GERD (gastroesophageal reflux disease)     Past Surgical History:  Procedure Laterality Date  . CORONARY STENT PLACEMENT  04/2008   RCA and LAD  . EP IMPLANTABLE DEVICE N/A 01/09/2016   Procedure: BiV Pacemaker Insertion CRT-P;  Surgeon: [name], MD;  Location: [facility];  Service: Cardiovascular;  Laterality: N/A;        Home Medications    Prior to Admission medications   Medication Sig Start Date End Date Taking? Authorizing Provider  acetaminophen (TYLENOL) 500 MG tablet Take 500 mg by mouth every 6 (six) hours as needed for pain.   Yes [provider]  cholecalciferol (VITAMIN D) 1000 units tablet Take 1,000 Units by mouth 2 (two) times daily.   Yes [provider]  Dorzolamide HCl-Timolol Mal PF 22.3-6.8 MG/ML SOLN Place 1 drop into the left eye 2 (two) times daily.    Yes [provider]  hydrochlorothiazide (HYDRODIURIL) 25 MG tablet TAKE ONE (1) TABLET EACH DAY 02/19/17  Yes Herminio Commons, MD  latanoprost (XALATAN) 0.005 % ophthalmic solution Place 1 drop into both eyes at bedtime.   Yes [provider]  lovastatin (MEVACOR) 10 MG tablet TAKE ONE (1) TABLET BY MOUTH EVERY DAY Patient taking differently: TAKE ONE (1) TABLET BY MOUTH EVERY DAY IN THE EVENING 01/05/17  Yes Herminio Commons, MD  nitroGLYCERIN (NITROSTAT) 0.4 MG SL tablet Place  1 tablet (0.4 mg total) under the tongue every 5 (five) minutes x 3 doses as needed for chest pain. 10/29/14  Yes Herminio Commons, MD  pantoprazole (PROTONIX) 40 MG tablet Take 40 mg by mouth daily.   Yes [provider]  Propylene Glycol (SYSTANE BALANCE OP) Place 1 drop into both eyes 4 (four) times daily.    Yes [provider]  rivaroxaban (XARELTO) 20 MG TABS tablet Take 1 tablet (20 mg total) by mouth daily with supper. Dx: paroxysmal atrial fibrillation 07/30/16  Yes [name], MD  tamsulosin (FLOMAX) 0.4  MG CAPS capsule Take 0.4 mg by mouth every evening.    Yes [provider]  timolol (BETIMOL) 0.5 % ophthalmic solution Place 1 drop into the left eye 2 (two) times daily.   Yes [provider]  triamcinolone cream (KENALOG) 0.1 % Apply 1 application topically daily as needed.   Yes [provider]  vitamin B-12 (CYANOCOBALAMIN) 1000 MCG tablet Take 1,000 mcg by mouth every evening.    Yes [provider]  amoxicillin-clavulanate (AUGMENTIN) 875-125 MG tablet Take 1 tablet by mouth every 12 (twelve) hours. 06/29/17   Noemi Chapel, MD  azithromycin (ZITHROMAX Z-PAK) 250 MG tablet Take 1 tablet (250 mg total) by mouth daily. 500mg  PO day 1, then 250mg  PO days 205 06/29/17   Noemi Chapel, MD    Family History Family History  Problem Relation Age of Onset  . Emphysema Mother        mother died with emphysema and cancer  . Cancer Mother     Social History Social History   Tobacco Use  . Smoking status: Former Smoker    Packs/day: 2.50    Years: 25.00    Pack years: 62.50    Types: Cigarettes    Last attempt to quit: 06/14/1973    Years since quitting: 44.0  . Smokeless tobacco: Former Systems developer    Types: Chew    Quit date: 06/14/1973  . Tobacco comment: some/chewed very little for about 2 years  Substance Use Topics  . Alcohol use: No  . Drug use: No     Allergies   Ace inhibitors and Tape   Review of Systems Review of Systems  All other systems reviewed and are negative.    Physical Exam Updated Vital Signs BP (!) 147/75   Pulse 65   Temp 98.1 F (36.7 C) (Oral)   Resp 18   SpO2 98%   Physical Exam  Constitutional: He appears well-developed and well-nourished. No distress.  HENT:  Head: Normocephalic and atraumatic.  Mouth/Throat: Oropharynx is clear and moist. No oropharyngeal exudate.  Eyes: Pupils are equal, round, and reactive to light. Conjunctivae and EOM are normal. Right eye exhibits no discharge. Left eye exhibits no  discharge. No scleral icterus.  Neck: Normal range of motion. Neck supple. No JVD present. No thyromegaly present.  Cardiovascular: Normal rate, regular rhythm, normal heart sounds and intact distal pulses. Exam reveals no gallop and no friction rub.  No murmur heard. Pulmonary/Chest: Effort normal. No respiratory distress. He has no wheezes. He has rales ( Rales at the bilateral bases left greater than right).  The patient is not tachypneic, he has no increased work of breathing, he is able to speak in full sentences and has no accessory muscle use  Abdominal: Soft. Bowel sounds are normal. He exhibits no distension and no mass. There is no tenderness.  Genitourinary:  Genitourinary Comments: Rectal exam unremarkable, palpable prostate which  is nontender, no blood in the rectal vault, no hemorrhoids or fissures or masses.  Hemoccult negative on exam.  Small amount of brown mucoid stool  Musculoskeletal: Normal range of motion. He exhibits edema ( 1+ edema to the bilateral lower extremities below the knees). He exhibits no tenderness.  Lymphadenopathy:    He has no cervical adenopathy.  Neurological: He is alert. Coordination normal.  Skin: Skin is warm and dry. No rash noted. No erythema.  Psychiatric: He has a normal mood and affect. His behavior is normal.  Nursing note and vitals reviewed.    ED Treatments / Results  Labs (all labs ordered are listed, but only abnormal results are displayed) Labs Reviewed  CBC - Abnormal; Notable for the following components:      Result Value   RBC 3.49 (*)    Hemoglobin 7.9 (*)    HCT 27.9 (*)    MCH 22.6 (*)    MCHC 28.3 (*)    RDW 17.5 (*)    All other components within normal limits  COMPREHENSIVE METABOLIC PANEL - Abnormal; Notable for the following components:   Sodium 133 (*)    Potassium 3.3 (*)    Chloride 99 (*)    Glucose, Bld 106 (*)    Calcium 8.7 (*)    ALT 9 (*)    All other components within normal limits  BRAIN  NATRIURETIC PEPTIDE - Abnormal; Notable for the following components:   B Natriuretic Peptide 349.0 (*)    All other components within normal limits  TROPONIN I - Abnormal; Notable for the following components:   Troponin I 0.05 (*)    All other components within normal limits    EKG None  Radiology Dg Chest 2 View  Result Date: 06/29/2017 CLINICAL DATA:  Productive cough.  Shortness of breath. EXAM: CHEST - 2 VIEW COMPARISON:  Radiographs of January 10, 2016. FINDINGS: Stable cardiomediastinal silhouette. Atherosclerosis of thoracic aorta is noted. Left-sided pacemaker is unchanged in position. No pneumothorax or significant pleural effusion is noted. Mildly increased bibasilar opacities are noted which may represent inflammation or atelectasis, left greater than right. Right upper lobe airspace opacity is also noted. Bony thorax is unremarkable. IMPRESSION: Mildly increased bibasilar opacities are noted which may represent inflammation or atelectasis, left greater than right. Right upper lobe airspace opacity is noted concerning for pneumonia. Aortic Atherosclerosis (ICD10-I70.0). Electronically Signed   By: Marijo Conception, M.D.   On: 06/29/2017 21:24    Procedures Procedures (including critical care time)  Medications Ordered in ED Medications  azithromycin (ZITHROMAX) tablet 500 mg (has no administration in time range)  cefTRIAXone (ROCEPHIN) 1 g in sodium chloride 0.9 % 100 mL IVPB (1 g Intravenous New Bag/Given 06/29/17 2153)  furosemide (LASIX) tablet 40 mg (40 mg Oral Given 06/29/17 2154)     Initial Impression / Assessment and Plan / ED Course  I have reviewed the triage vital signs and the nursing notes.  Pertinent labs & imaging results that were available during my care of the patient were reviewed by me and considered in my medical decision making (see chart for details).     Labs reviewed from prior visits, it appears that the patient has a new anemia, he last had  blood work several years ago during which time he was minimally anemic at all.  He denies seeing any blood in his stools, he is anticoagulated on Xarelto and has a pacemaker in the left side of his chest.  The patient  will need further evaluation as to the source of his anemia including checking his stool, repeating lab work, he will need a BNP and a troponin as well as a chest x-ray as I suspect he may have some pulmonary edema.  He has no history of pulmonary fibrosis or interstitial lung disease.  Chest x-ray reviewed by myself, it appears that he has pneumonia.  His lab work however was very reassuring, he has a chronically elevated troponin which is barely elevated at 0.05 and not different than prior values.  His renal function was preserved, his BNP was less than 400 and he does not have a leukocytosis of concern.  Due to these results I believe that the patient most likely has a pneumonia.  He will be able to follow-up closely in the outpatient setting.  I discussed this with his family at length including the need for a colonoscopy.  He is followed by [facility] locally in the [facility] in [place].  The patient has agreed to the follow-up plan.  His Hemoccult is negative and he can follow-up for his anemia evaluation outpatient.  The family reported to me that there was a family history of lymphoma and multiple family members.  Final Clinical Impressions(s) / ED Diagnoses   Final diagnoses:  Anemia, unspecified type  Community acquired pneumonia, unspecified laterality    ED Discharge Orders        Ordered    amoxicillin-clavulanate (AUGMENTIN) 875-125 MG tablet  Every 12 hours     06/29/17 2231    azithromycin (ZITHROMAX Z-PAK) 250 MG tablet  Daily     06/29/17 2231       Noemi Chapel, MD 06/29/17 2234

## 2017-06-29 NOTE — Discharge Instructions (Signed)
He will need to follow-up with your doctor for further evaluation of your low blood counts.  Please take the Augmentin and Zithromax daily for your pneumonia  Return to the emergency department for increased difficulty breathing, high fevers, chest pain, vomiting or any worsening symptoms.  Please obtain all of your results from medical records or have your doctors office obtain the results - share them with your doctor - you should be seen at your doctors office in the next 2 days. Call today to arrange your follow up. Take the medications as prescribed. Please review all of the medicines and only take them if you do not have an allergy to them. Please be aware that if you are taking birth control pills, taking other prescriptions, ESPECIALLY ANTIBIOTICS may make the birth control ineffective - if this is the case, either do not engage in sexual activity or use alternative methods of birth control such as condoms until you have finished the medicine and your family doctor says it is OK to restart them. If you are on a blood thinner such as COUMADIN, be aware that any other medicine that you take may cause the coumadin to either work too much, or not enough - you should have your coumadin level rechecked in next 7 days if this is the case.  ?  It is also a possibility that you have an allergic reaction to any of the medicines that you have been prescribed - Everybody reacts differently to medications and while MOST people have no trouble with most medicines, you may have a reaction such as nausea, vomiting, rash, swelling, shortness of breath. If this is the case, please stop taking the medicine immediately and contact your physician.  ?  You should return to the ER if you develop severe or worsening symptoms.

## 2017-07-05 DIAGNOSIS — I5022 Chronic systolic (congestive) heart failure: Secondary | ICD-10-CM | POA: Diagnosis not present

## 2017-07-05 DIAGNOSIS — I480 Paroxysmal atrial fibrillation: Secondary | ICD-10-CM | POA: Diagnosis not present

## 2017-07-05 DIAGNOSIS — J189 Pneumonia, unspecified organism: Secondary | ICD-10-CM | POA: Diagnosis not present

## 2017-07-05 DIAGNOSIS — I2510 Atherosclerotic heart disease of native coronary artery without angina pectoris: Secondary | ICD-10-CM | POA: Diagnosis not present

## 2017-07-13 ENCOUNTER — Encounter: Payer: Self-pay | Admitting: Gastroenterology

## 2017-07-14 ENCOUNTER — Encounter: Payer: Self-pay | Admitting: Physician Assistant

## 2017-07-14 ENCOUNTER — Telehealth: Payer: Self-pay | Admitting: Physician Assistant

## 2017-07-14 DIAGNOSIS — D649 Anemia, unspecified: Secondary | ICD-10-CM

## 2017-07-14 NOTE — Progress Notes (Addendum)
Cardiology Office Note    Date:  07/15/2017  ID:  Nicholas Sanchez, DOB 05-17-1933, MRN [id number] PCP:  Sinda Du, MD  Cardiologist:  Dr. Bronson Ing   Chief Complaint: f/u SOB  History of Present Illness:  Nicholas Sanchez is a 84 y.o. male with history of CAD (STEMI 04/2008 s/p BMS to prox & distal RCA, staged DES to LAD same admission), LV dysfunction (EF 45-50% in 01/2016), mild pulm HTN, sinus node dysfunction and high grade AV block s/p St. Jude BiV-PPM 01/2016, PAF, CKD II, GERD, BPH, HTN, HLD, recent new onset anemia and PNA who presents for eval of SOB.  Last echo 01/2016: EF 45-50%, diffuse HK, grade 2 DD, mildly dilated aortic root, severely dilated LA, PASP 72mmHg.  In 12/2017 he was seen by Dr. Bronson Ing for chest discomfort with exertion and dyspnea. Nuclear stress test to evaluate this 03/2017 showed findings c/w large prior MI, no active ischemia, LVEF 45-54%. Dr. Court Joy note from 03/09/17 indicated the patient had recently been found to be anemic with Hgb 11 and colonoscopy was planned but not pursued on the patient's behalf. Last device check 04/2017 - 57% AT/AF.   He presented to the ER 06/29/17 with complaints of generalized fatigue and dyspnea on exertion. Looking back this had been the same symptom which prompted the stress test, only gradually worsening. He denies any CP, syncope or near syncope. He was complaining of a cough as well. CXR 06/29/17: mild bibasilar opacities possibly inflammation or atx L>R, RUL airspace opacity concerning for PNA. Troponin elevated 0.05, BNP 349, Na 133, K 3.3, Cr 1.10, Hgb dropped to 7.9 (previously 14 in 2017). He is on Xarelto. He was given a prescription for antibiotics and discharged with plan for outpatient follow-up. ER note indicates negative hemoccult. The patient reports EDP did a rectal exam. He himself has not seen any blood. He has a history of hematuria in the remote past but not recently. He was seen back by Dr. Luan Pulling  who is following his pneumonia. [facility] for which he has an appt on 6/5. He was started on iron pills a few days ago. He denies having a recheck hemoglobin. He is feeling slightly better on the iron, but still continues to note that his energy and endurance are greatly decreased from prior. Upon pre-noting his visit yesterday I wanted him to come in for ASAP bloodwork to recheck CBC but he declined and wished instead to get it done at today's visit. No significant edema or orthopnea. He's been losing weight unintentionally, down about 7lb from 2018 so far.  Past Medical History:  Diagnosis Date  . CAD (coronary artery disease)    a. STEMI 04/2008 s/p BMS to prox & distal RCA, staged DES to LAD same admission. b. Nuc 03/2017: scar but no ischemia, EF 45-54%.  . CKD (chronic kidney disease), stage II   . Cough, persistent   . GERD (gastroesophageal reflux disease)   . History of BPH   . HTN (hypertension)   . Hypercholesteremia   . LV dysfunction    a. EF 45-50% in 01/2016.  . Mild pulmonary hypertension ([place])   . PAF (paroxysmal atrial fibrillation) ([place])   . Sinus node dysfunction ([place])    a. s/p StJude CRT-pacemaker 01/2016.    Past Surgical History:  Procedure Laterality Date  . CORONARY STENT PLACEMENT  04/2008   RCA and LAD  . EP IMPLANTABLE DEVICE  N/A 01/09/2016   Procedure: BiV Pacemaker Insertion CRT-P;  Surgeon: [name], MD;  Location: [facility];  Service: Cardiovascular;  Laterality: N/A;    Current Medications: Current Meds  Medication Sig  . acetaminophen (TYLENOL) 500 MG tablet Take 500 mg by mouth every 6 (six) hours as needed for pain.  [name] amoxicillin-clavulanate (AUGMENTIN) 875-125 MG tablet Take 1 tablet by mouth every 12 (twelve) hours.  . cholecalciferol (VITAMIN D) 1000 units tablet Take 1,000 Units by mouth 2 (two) times daily.  . Dorzolamide HCl-Timolol Mal PF 22.3-6.8 MG/ML SOLN Place 1 drop into  the left eye 2 (two) times daily.   . hydrochlorothiazide (HYDRODIURIL) 25 MG tablet TAKE ONE (1) TABLET EACH DAY  . latanoprost (XALATAN) 0.005 % ophthalmic solution Place 1 drop into both eyes at bedtime.  . lovastatin (MEVACOR) 10 MG tablet TAKE ONE (1) TABLET BY MOUTH EVERY DAY (Patient taking differently: TAKE ONE (1) TABLET BY MOUTH EVERY DAY IN THE EVENING)  . nitroGLYCERIN (NITROSTAT) 0.4 MG SL tablet Place 1 tablet (0.4 mg total) under the tongue every 5 (five) minutes x 3 doses as needed for chest pain.  . pantoprazole (PROTONIX) 40 MG tablet Take 40 mg by mouth daily.  [name] Propylene Glycol (SYSTANE BALANCE OP) Place 1 drop into both eyes 4 (four) times daily.   . rivaroxaban (XARELTO) 20 MG TABS tablet Take 1 tablet (20 mg total) by mouth daily with supper. Dx: paroxysmal atrial fibrillation  . tamsulosin (FLOMAX) 0.4 MG CAPS capsule Take 0.4 mg by mouth every evening.   . timolol (BETIMOL) 0.5 % ophthalmic solution Place 1 drop into the left eye 2 (two) times daily.  [name] triamcinolone cream (KENALOG) 0.1 % Apply 1 application topically daily as needed.  . vitamin B-12 (CYANOCOBALAMIN) 1000 MCG tablet Take 1,000 mcg by mouth every evening.     Allergies:   Ace inhibitors and Tape   Social History   Socioeconomic History  . Marital status: Married    Spouse name: Not on file  . Number of children: Not on file  . Years of education: Not on file  . Highest education level: Not on file  Occupational History  . Occupation: retired    Comment: Geographical information systems officer  . Financial resource strain: Not on file  . Food insecurity:    Worry: Not on file    Inability: Not on file  . Transportation needs:    Medical: Not on file    Non-medical: Not on file  Tobacco Use  . Smoking status: Former Smoker    Packs/day: 2.50    Years: 25.00    Pack years: 62.50    Types: Cigarettes    Last attempt to quit: 06/14/1973    Years since quitting: 44.1  . Smokeless tobacco: Former Systems developer     Types: Chew    Quit date: 06/14/1973  . Tobacco comment: some/chewed very little for about 2 years  Substance and Sexual Activity  . Alcohol use: No  . Drug use: No  . Sexual activity: Not on file  Lifestyle  . Physical activity:    Days per week: Not on file    Minutes per session: Not on file  . Stress: Not on file  Relationships  . Social connections:    Talks on phone: Not on file    Gets together: Not on file    Attends religious service: Not on file    Active member of club or organization: Not  on file    Attends meetings of clubs or organizations: Not on file    Relationship status: Not on file  Other Topics Concern  . Not on file  Social History Narrative  . Not on file     Family History:  Family History  Problem Relation Age of Onset  . Emphysema Mother        mother died with emphysema and cancer  . Cancer Mother     ROS:   Please see the history of present illness.  All other systems are reviewed and otherwise negative.    PHYSICAL EXAM:   VS:  BP (!) 136/58   Pulse 89   Ht 6\' 1"  (1.854 m)   Wt 210 lb (95.3 kg)   SpO2 98%   BMI 27.71 kg/m   BMI: Body mass index is 27.71 kg/m. GEN: Well nourished, well developed WM in no acute distress  HEENT: normocephalic, atraumatic Neck: no JVD, carotid bruits, or masses Cardiac: RRR; no murmurs, rubs, or gallops, no edema  Respiratory:  clear to auscultation bilaterally, normal work of breathing GI: soft, nontender, nondistended, + BS MS: no deformity or atrophy  Skin: warm and dry, no rash Neuro:  Alert and Oriented x 3, Strength and sensation are intact, follows commands Psych: euthymic mood, full affect  Wt Readings from Last 3 Encounters:  07/15/17 210 lb (95.3 kg)  03/09/17 214 lb (97.1 kg)  01/05/17 217 lb (98.4 kg)      Studies/Labs Reviewed:   EKG:  EKG was ordered today and personally reviewed by me and demonstrates V pacing - at times there appears to be P wave activity but this is  inconsistent.  Recent Labs: 06/29/2017: ALT 9; B Natriuretic Peptide 349.0; BUN 13; Creatinine, Ser 1.10; Hemoglobin 7.9; Platelets 186; Potassium 3.3; Sodium 133   Lipid Panel    Component Value Date/Time   CHOL 104 03/01/2017 1004   TRIG 54 03/01/2017 1004   HDL 33 (L) 03/01/2017 1004   CHOLHDL 3.2 03/01/2017 1004   VLDL 23 03/06/2014 0903   LDLCALC 58 03/01/2017 1004    Additional studies/ records that were reviewed today include: Summarized above.    ASSESSMENT & PLAN:   1. Fatigue/SOB with concern for symptomatic anemia - symptoms started around 6 months ago at which time notes indicate diagnosis of anemia with Hgb 11. Per Epic, his baseline was around 14 in 2017. He was seen in the ER 06/29/17 at which time blood count was 7.9. Outpatient workup was recommended. Given his cardiac history including chronic anticoagulation I am very concerned about expediting this. Will recheck CBC stat today along with anemia panel. If hemoglobin has continued to downtrend I did tell the patient we would probably recommend he go back to the emergency room to facilitate admission and workup of anemia. Will recheck echocardiogram to exclude any new findings given recent elevated BNP and minimally elevated troponin in the ER, but these are likely a demand process. I am additionally concerned about his unintentional weight los as well. Also encouraged he couple his iron supp with some sort of vitamin C food to enhance absorption. 2. Hypokalemia - recheck today with BMET and Mg. 3. Paroxysmal atrial fib - he is on Xarelto chronically and goes in/out per PPM interrogation recently. May need to hold Xarelto if anemia shows evidence of worsening. 4. CAD - not on ASA due to concomitant Xarelto use. No recent anginal-type chest pain. Nuclear stress test 03/2017 for these symptoms showed no  evidence of ischemia. If symptoms persist despite normalization of anemia, definitive ischemic testing can be considered, but  with unexplained anemia he is not presently a candidate for cath. 5. LV dysfunction - appears euvolemic on exam. F/u echocardiogram as planned.  Disposition: F/u with Dr. Bronson Ing in 4 weeks.   Medication Adjustments/Labs and Tests Ordered: Current medicines are reviewed at length with the patient today.  Concerns regarding medicines are outlined above. Medication changes, Labs and Tests ordered today are summarized above and listed in the Patient Instructions accessible in Encounters.   Signed, Charlie Pitter, PA-C  07/15/2017 1:29 PM    [facility] Location in [place]. [place], [place] [address] Ph: [phone]; Fax [phone]

## 2017-07-14 NOTE — Telephone Encounter (Signed)
Called pt. He states that he is feeling somewhat better. He went to see Dr. Luan Pulling on Tuesday, and had another cxray done. He said it showed he still has "a touch" of pneumonia. He states that he is taking the antibiotics for that and he is also on a high high dose of iron for the anemia from the [place]. He told me he was not able to come to get labs done today, but he will try to get here early tomorrow to have them done. He is worried about all of the money he is spending as he is on a fixed income. I advised him that he needs to have them done. I requested LOV and labs from Dr. Luan Pulling office. Lab orders placed.

## 2017-07-14 NOTE — Telephone Encounter (Signed)
I was reviewing charts for tomorrow and noted this patient's recent ER course. He has h/o CAD, PAF on Xarelto, CKD II, LV dysfunction. It appears he was sent to ER 06/29/17 by [facility] for concern for anemia and elevated BNP. ER w/u revealed new drop in Hgb to 7.9 (previously 14 in 01/2016), elevated troponin of 0.05, elevated BNP and possible PNA. Per ER note he was prescribed antibiotics and asked to f/u as OP.  I have not met this patient before but given that he is on anticoagulation, I am very concerned about the drop in Hgb and we need to make sure this has been addressed in the last 2 weeks. Dr. Bronson Ing reviewed chart and it looks like the patient has been followed by Dr. Luan Pulling as well as the [place].   First, please call patient - if the patient has noticed ANY progression in his symptoms of fatigue and SOB I would be highly concerned this represents symptomatic anemia and he should instead proceed back to the ER for further evaluation.   Otherwise if he is feeling well per d/w Dr. Bronson Ing, he recommends to please obtain most recent CBC results from Dr. Luan Pulling (find out if he saw his PCP in the interim to address anemia?) and get stat CBC today. Also would need repeat BMET to address low potassium and low sodium.  Rosalin Buster PA-C

## 2017-07-15 ENCOUNTER — Ambulatory Visit: Payer: Medicare Other | Admitting: Physician Assistant

## 2017-07-15 ENCOUNTER — Encounter: Payer: Self-pay | Admitting: Physician Assistant

## 2017-07-15 ENCOUNTER — Other Ambulatory Visit (HOSPITAL_COMMUNITY)
Admission: RE | Admit: 2017-07-15 | Discharge: 2017-07-15 | Disposition: A | Discharge status: Home / Self Care | Payer: No Typology Code available for payment source | Source: Ambulatory Visit | Attending: Physician Assistant | Admitting: Physician Assistant

## 2017-07-15 VITALS — BP 136/58 | HR 89 | Ht 73.0 in | Wt 210.0 lb

## 2017-07-15 DIAGNOSIS — R5383 Other fatigue: Secondary | ICD-10-CM

## 2017-07-15 DIAGNOSIS — R0602 Shortness of breath: Secondary | ICD-10-CM | POA: Diagnosis not present

## 2017-07-15 DIAGNOSIS — I2510 Atherosclerotic heart disease of native coronary artery without angina pectoris: Secondary | ICD-10-CM

## 2017-07-15 DIAGNOSIS — R0609 Other forms of dyspnea: Secondary | ICD-10-CM

## 2017-07-15 DIAGNOSIS — I480 Paroxysmal atrial fibrillation: Secondary | ICD-10-CM | POA: Diagnosis not present

## 2017-07-15 DIAGNOSIS — D649 Anemia, unspecified: Secondary | ICD-10-CM | POA: Diagnosis not present

## 2017-07-15 DIAGNOSIS — E876 Hypokalemia: Secondary | ICD-10-CM

## 2017-07-15 DIAGNOSIS — I519 Heart disease, unspecified: Secondary | ICD-10-CM | POA: Diagnosis not present

## 2017-07-15 LAB — BASIC METABOLIC PANEL
ANION GAP: 9 (ref 5–15)
BUN: 12 mg/dL (ref 6–20)
CHLORIDE: 102 mmol/L (ref 101–111)
CO2: 26 mmol/L (ref 22–32)
Calcium: 9.1 mg/dL (ref 8.9–10.3)
Creatinine, Ser: 1.07 mg/dL (ref 0.61–1.24)
GFR calc Af Amer: >60 mL/min (ref >60)
GFR calc non Af Amer: >60 mL/min (ref >60)
GLUCOSE: 88 mg/dL (ref 65–99)
POTASSIUM: 3.6 mmol/L (ref 3.5–5.1)
Sodium: 137 mmol/L (ref 135–145)

## 2017-07-15 LAB — VITAMIN B12: Vitamin B-12: 835 pg/mL (ref 180–914)

## 2017-07-15 LAB — CBC WITH DIFFERENTIAL/PLATELET
BASOS ABS: 0 10*3/uL (ref 0.0–0.1)
Basophils Relative: 0 %
Eosinophils Absolute: 0.1 10*3/uL (ref 0.0–0.7)
Eosinophils Relative: 1 %
HEMATOCRIT: 30.8 % — AB (ref 39.0–52.0)
HEMOGLOBIN: 8.8 g/dL — AB (ref 13.0–17.0)
LYMPHS ABS: 1.3 10*3/uL (ref 0.7–4.0)
LYMPHS PCT: 24 %
MCH: 23.2 pg — AB (ref 26.0–34.0)
MCHC: 28.6 g/dL — ABNORMAL LOW (ref 30.0–36.0)
MCV: 81.3 fL (ref 78.0–100.0)
Monocytes Absolute: 0.5 10*3/uL (ref 0.1–1.0)
Monocytes Relative: 10 %
NEUTROS PCT: 65 %
Neutro Abs: 3.6 10*3/uL (ref 1.7–7.7)
Platelets: 219 10*3/uL (ref 150–400)
RBC: 3.79 MIL/uL — AB (ref 4.22–5.81)
RDW: 18.1 % — ABNORMAL HIGH (ref 11.5–15.5)
WBC: 5.6 10*3/uL (ref 4.0–10.5)

## 2017-07-15 LAB — FERRITIN: Ferritin: 8 ng/mL — ABNORMAL LOW (ref 24–336)

## 2017-07-15 LAB — IRON AND TIBC
IRON: 32 ug/dL — AB (ref 45–182)
Saturation Ratios: 7 % — ABNORMAL LOW (ref 17.9–39.5)
TIBC: 445 ug/dL (ref 250–450)
UIBC: 413 ug/dL

## 2017-07-15 LAB — FOLATE: Folate: 10.2 ng/mL (ref >5.9)

## 2017-07-15 LAB — MAGNESIUM: MAGNESIUM: 1.9 mg/dL (ref 1.7–2.4)

## 2017-07-15 NOTE — Patient Instructions (Signed)
Medication Instructions:  Your physician recommends that you continue on your current medications as directed. Please refer to the Current Medication list given to you today.   Labwork: Your physician recommends that you return for lab work today.   Testing/Procedures: Your physician has requested that you have an echocardiogram. Echocardiography is a painless test that uses sound waves to create images of your heart. It provides your doctor with information about the size and shape of your heart and how well your heart's chambers and valves are working. This procedure takes approximately one hour. There are no restrictions for this procedure.    Follow-Up: Your physician recommends that you schedule a follow-up appointment in: 4 Weeks with Dr.Koneswaran   Any Other Special Instructions Will Be Listed Below (If Applicable).     If you need a refill on your cardiac medications before your next appointment, please call your pharmacy. Thank you for choosing [facility]!

## 2017-07-19 ENCOUNTER — Ambulatory Visit (HOSPITAL_COMMUNITY): Payer: No Typology Code available for payment source

## 2017-07-20 ENCOUNTER — Ambulatory Visit (INDEPENDENT_AMBULATORY_CARE_PROVIDER_SITE_OTHER): Payer: Medicare Other | Admitting: *Deleted

## 2017-07-20 DIAGNOSIS — I442 Atrioventricular block, complete: Secondary | ICD-10-CM

## 2017-07-21 NOTE — Progress Notes (Signed)
Remote pacemaker transmission.   

## 2017-07-22 ENCOUNTER — Other Ambulatory Visit (HOSPITAL_COMMUNITY): Payer: Self-pay | Admitting: Pulmonary Disease

## 2017-07-22 ENCOUNTER — Ambulatory Visit (HOSPITAL_COMMUNITY)
Admission: RE | Admit: 2017-07-22 | Discharge: 2017-07-22 | Disposition: A | Discharge status: Home / Self Care | Payer: Medicare Other | Source: Ambulatory Visit | Attending: Physician Assistant | Admitting: Physician Assistant

## 2017-07-22 ENCOUNTER — Encounter: Payer: Self-pay | Admitting: Cardiology

## 2017-07-22 ENCOUNTER — Ambulatory Visit (HOSPITAL_COMMUNITY)
Admission: RE | Admit: 2017-07-22 | Discharge: 2017-07-22 | Disposition: A | Discharge status: Home / Self Care | Payer: Medicare Other | Source: Ambulatory Visit | Attending: Pulmonary Disease | Admitting: Pulmonary Disease

## 2017-07-22 DIAGNOSIS — R0602 Shortness of breath: Secondary | ICD-10-CM | POA: Insufficient documentation

## 2017-07-22 DIAGNOSIS — I2510 Atherosclerotic heart disease of native coronary artery without angina pectoris: Secondary | ICD-10-CM | POA: Insufficient documentation

## 2017-07-22 DIAGNOSIS — I119 Hypertensive heart disease without heart failure: Secondary | ICD-10-CM | POA: Insufficient documentation

## 2017-07-22 DIAGNOSIS — E7800 Pure hypercholesterolemia, unspecified: Secondary | ICD-10-CM | POA: Insufficient documentation

## 2017-07-22 DIAGNOSIS — J189 Pneumonia, unspecified organism: Secondary | ICD-10-CM

## 2017-07-22 DIAGNOSIS — I517 Cardiomegaly: Secondary | ICD-10-CM | POA: Diagnosis not present

## 2017-07-22 DIAGNOSIS — R918 Other nonspecific abnormal finding of lung field: Secondary | ICD-10-CM | POA: Insufficient documentation

## 2017-07-22 DIAGNOSIS — I459 Conduction disorder, unspecified: Secondary | ICD-10-CM | POA: Diagnosis not present

## 2017-07-22 DIAGNOSIS — K219 Gastro-esophageal reflux disease without esophagitis: Secondary | ICD-10-CM | POA: Insufficient documentation

## 2017-07-22 DIAGNOSIS — R0609 Other forms of dyspnea: Secondary | ICD-10-CM

## 2017-07-22 DIAGNOSIS — R05 Cough: Secondary | ICD-10-CM | POA: Diagnosis not present

## 2017-07-22 DIAGNOSIS — I080 Rheumatic disorders of both mitral and aortic valves: Secondary | ICD-10-CM | POA: Insufficient documentation

## 2017-07-22 LAB — CUP PACEART REMOTE DEVICE CHECK
Battery Remaining Longevity: 85 mo
Battery Voltage: 2.99 V
Brady Statistic AP VP Percent: 78 %
Brady Statistic AS VS Percent: 1 %
Brady Statistic RA Percent Paced: 34 %
Date Time Interrogation Session: 20190521152238
Implantable Lead Implant Date: 20171109
Implantable Lead Implant Date: 20171109
Implantable Lead Location: 753858
Implantable Lead Location: 753859
Implantable Pulse Generator Implant Date: 20171109
Lead Channel Impedance Value: 540 Ohm
Lead Channel Impedance Value: 590 Ohm
Lead Channel Pacing Threshold Amplitude: 0.5 V
Lead Channel Pacing Threshold Amplitude: 0.5 V
Lead Channel Pacing Threshold Amplitude: 1.25 V
Lead Channel Pacing Threshold Pulse Width: 0.5 ms
Lead Channel Setting Pacing Amplitude: 2 V
Lead Channel Setting Pacing Pulse Width: 0.8 ms
Lead Channel Setting Sensing Sensitivity: 4 mV
MDC IDC LEAD IMPLANT DT: 20171109
MDC IDC LEAD LOCATION: 753860
MDC IDC MSMT BATTERY REMAINING PERCENTAGE: 95.5 %
MDC IDC MSMT LEADCHNL LV PACING THRESHOLD PULSEWIDTH: 0.8 ms
MDC IDC MSMT LEADCHNL RA IMPEDANCE VALUE: 400 Ohm
MDC IDC MSMT LEADCHNL RA SENSING INTR AMPL: 1.5 mV
MDC IDC MSMT LEADCHNL RV PACING THRESHOLD PULSEWIDTH: 0.5 ms
MDC IDC MSMT LEADCHNL RV SENSING INTR AMPL: 8.2 mV
MDC IDC SET LEADCHNL RA PACING AMPLITUDE: 2 V
MDC IDC SET LEADCHNL RV PACING AMPLITUDE: 2.5 V
MDC IDC SET LEADCHNL RV PACING PULSEWIDTH: 0.5 ms
MDC IDC STAT BRADY AP VS PERCENT: 1 %
MDC IDC STAT BRADY AS VP PERCENT: 21 %
Pulse Gen Model: 3262
Pulse Gen Serial Number: 7949810

## 2017-07-22 NOTE — Progress Notes (Signed)
*  PRELIMINARY RESULTS* Echocardiogram 2D Echocardiogram has been performed.  Samuel Germany 07/22/2017, 3:05 PM

## 2017-08-04 ENCOUNTER — Encounter: Payer: Self-pay | Admitting: Gastroenterology

## 2017-08-04 ENCOUNTER — Encounter: Payer: Self-pay | Admitting: *Deleted

## 2017-08-04 ENCOUNTER — Other Ambulatory Visit: Payer: Self-pay | Admitting: *Deleted

## 2017-08-04 ENCOUNTER — Ambulatory Visit (INDEPENDENT_AMBULATORY_CARE_PROVIDER_SITE_OTHER): Payer: No Typology Code available for payment source | Admitting: Gastroenterology

## 2017-08-04 VITALS — BP 138/81 | HR 86 | Temp 96.8°F | Ht 73.0 in | Wt 210.2 lb

## 2017-08-04 DIAGNOSIS — D509 Iron deficiency anemia, unspecified: Secondary | ICD-10-CM

## 2017-08-04 DIAGNOSIS — R1310 Dysphagia, unspecified: Secondary | ICD-10-CM

## 2017-08-04 MED ORDER — PEG 3350-KCL-NA BICARB-NACL 420 G PO SOLR: 4000.0000 mL | Freq: Once | ORAL | 0 refills | Status: AC

## 2017-08-04 NOTE — Progress Notes (Addendum)
Primary Care Physician:  Sinda Du, MD  Referring Physician: Hedda Slade  Primary Gastroenterologist:  Dr. [name]   Chief Complaint  Patient presents with  . Anemia    HPI:   Nicholas Sanchez is a 84 y.o. male presenting today at the request of [place] due to [place]. He presented to the ED in April 2019 and found to have Hgb 7.9. He states he has been losing weight since April 2019. Reports he weighed 226 in April 2019, now 210 today. Review of epic shows that he was a high of 220 most recently as of Feb 2018. Most recent Hgb 8.8 Ferritin low at 8. Per review in epic, he reportedly had Hgb 11 range in Dec 2018. 2017 no anemia.   Appetite not too good. Can't eat as much as he used to. Doesn't want much to eat. Notes occasional dysphagia. Remote EGD at the [place] in the early 2000s but states he believes it was normal. Colonoscopy in the early 2000s possibly, believes it was normal. No abdominal pain. No nausea, vomiting. Chronic GERD. Protonix daily. On Xarelto for afib. He has recently seen cardiology.   Energy has picked up but not where he has been in the past. Runs out of gas quick. No blood transfusion. Feels short of breath with activity.   No NSAIDS.  Past Medical History:  Diagnosis Date  . CAD (coronary artery disease)    a. STEMI 04/2008 s/p BMS to prox & distal RCA, staged DES to LAD same admission. b. Nuc 03/2017: scar but no ischemia, EF 45-54%.  . CKD (chronic kidney disease), stage II   . Cough, persistent   . GERD (gastroesophageal reflux disease)   . History of BPH   . HTN (hypertension)   . Hypercholesteremia   . LV dysfunction    a. EF 45-50% in 01/2016.  . Mild pulmonary hypertension ([place])   . PAF (paroxysmal atrial fibrillation) ([place])   . Sinus node dysfunction ([place])    a. s/p StJude CRT-pacemaker 01/2016.    Past Surgical History:  Procedure Laterality Date  . CORONARY STENT PLACEMENT  04/2008   RCA and LAD  . EP IMPLANTABLE DEVICE N/A 01/09/2016   Procedure: BiV Pacemaker Insertion CRT-P;  Surgeon: [name], MD;  Location: [facility];  Service: Cardiovascular;  Laterality: N/A;    Current Outpatient Medications  Medication Sig Dispense Refill  . acetaminophen (TYLENOL) 500 MG tablet Take 500 mg by mouth every 6 (six) hours as needed for pain.    . cholecalciferol (VITAMIN D) 1000 units tablet Take 1,000 Units by mouth 2 (two) times daily.    . Dorzolamide HCl-Timolol Mal PF 22.3-6.8 MG/ML SOLN Place 1 drop into the left eye 2 (two) times daily.     . ferrous sulfate 325 (65 FE) MG tablet Take 325 mg by mouth 3 (three) times daily.    . hydrochlorothiazide (HYDRODIURIL) 25 MG tablet TAKE ONE (1) TABLET EACH DAY 90 tablet 3  . latanoprost (XALATAN) 0.005 % ophthalmic solution Place 1 drop into both eyes at bedtime.    . lovastatin (MEVACOR) 10 MG tablet TAKE ONE (1) TABLET BY MOUTH EVERY DAY (Patient taking differently: TAKE ONE (1) TABLET BY MOUTH EVERY DAY IN THE EVENING) 30 tablet 11  . nitroGLYCERIN (NITROSTAT) 0.4 MG SL tablet Place 1 tablet (0.4 mg total) under the tongue every 5 (five) minutes x 3 doses as needed for chest pain. 25 tablet 3  . pantoprazole (PROTONIX)  40 MG tablet Take 40 mg by mouth daily.    [name] Propylene Glycol (SYSTANE BALANCE OP) Place 1 drop into both eyes 4 (four) times daily.     . rivaroxaban (XARELTO) 20 MG TABS tablet Take 1 tablet (20 mg total) by mouth daily with supper. Dx: paroxysmal atrial fibrillation 90 tablet 3  . tamsulosin (FLOMAX) 0.4 MG CAPS capsule Take 0.4 mg by mouth every evening.     . timolol (BETIMOL) 0.5 % ophthalmic solution Place 1 drop into the left eye 2 (two) times daily.    [name] triamcinolone cream (KENALOG) 0.1 % Apply 1 application topically daily as needed.     No current facility-administered medications for this visit.     Allergies as of 08/04/2017 - Review Complete 08/04/2017  Allergen Reaction Noted  . Ace inhibitors Nausea Only 07/18/2010  . Tape Rash  01/08/2016    Family History  Problem Relation Age of Onset  . Emphysema Mother        mother died with emphysema and cancer  . Cancer Mother   . Colon cancer Neg Hx   . Colon polyps Neg Hx     Social History   Socioeconomic History  . Marital status: Married    Spouse name: Not on file  . Number of children: Not on file  . Years of education: Not on file  . Highest education level: Not on file  Occupational History  . Occupation: retired    Comment: Geographical information systems officer  . Financial resource strain: Not on file  . Food insecurity:    Worry: Not on file    Inability: Not on file  . Transportation needs:    Medical: Not on file    Non-medical: Not on file  Tobacco Use  . Smoking status: Former Smoker    Packs/day: 2.50    Years: 25.00    Pack years: 62.50    Types: Cigarettes    Last attempt to quit: 06/14/1973    Years since quitting: 44.1  . Smokeless tobacco: Former Systems developer    Types: Chew    Quit date: 06/14/1973  . Tobacco comment: some/chewed very little for about 2 years  Substance and Sexual Activity  . Alcohol use: No  . Drug use: No  . Sexual activity: Not on file  Lifestyle  . Physical activity:    Days per week: Not on file    Minutes per session: Not on file  . Stress: Not on file  Relationships  . Social connections:    Talks on phone: Not on file    Gets together: Not on file    Attends religious service: Not on file    Active member of club or organization: Not on file    Attends meetings of clubs or organizations: Not on file    Relationship status: Not on file  . Intimate partner violence:    Fear of current or ex partner: Not on file    Emotionally abused: Not on file    Physically abused: Not on file    Forced sexual activity: Not on file  Other Topics Concern  . 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     Review of Systems: Gen: see HPI  CV: Denies chest pain, heart palpitations,  peripheral edema, syncope.  Resp: Denies shortness of breath at rest or with exertion. Denies wheezing or cough.  GI: see HPI  GU : Denies urinary  burning, urinary frequency, urinary hesitancy MS: Denies joint pain, muscle weakness, cramps, or limitation of movement.  Derm: Denies rash, itching, dry skin Psych: Denies depression, anxiety, memory loss, and confusion Heme: see HPI   Physical Exam: BP 138/81   Pulse 86   Temp (!) 96.8 F (36 C) (Oral)   Ht 6\' 1"  (1.854 m)   Wt 210 lb 3.2 oz (95.3 kg)   BMI 27.73 kg/m  General:   Alert and oriented. Pleasant and cooperative. Well-nourished and well-developed.  Head:  Normocephalic and atraumatic. Eyes:  Without icterus, sclera clear and conjunctiva pink.  Ears:  Normal auditory acuity. Nose:  No deformity, discharge,  or lesions. Mouth:  No deformity or lesions, oral mucosa pink.  Lungs:  Clear to auscultation bilaterally.  Heart:  S1, S2 present without murmurs appreciated.  Abdomen:  +BS, soft, non-tender and non-distended. No HSM noted. No guarding or rebound. No masses appreciated.  Rectal:  Deferred  Msk:  Symmetrical without gross deformities. Normal posture. Extremities:  Without edema. Neurologic:  Alert and  oriented x4 Psych:  Alert and cooperative. Normal mood and affect.  Lab Results  Component Value Date   WBC 5.6 07/15/2017   HGB 8.8 (L) 07/15/2017   HCT 30.8 (L) 07/15/2017   MCV 81.3 07/15/2017   PLT 219 07/15/2017   Lab Results  Component Value Date   IRON 32 (L) 07/15/2017   TIBC 445 07/15/2017   FERRITIN 8 (L) 07/15/2017

## 2017-08-04 NOTE — H&P (View-Only) (Signed)
Primary Care Physician:  Sinda Du, MD  Referring Physician: Hedda Slade  Primary Gastroenterologist:  Dr. [name]   Chief Complaint  Patient presents with  . Anemia    HPI:   Nicholas Sanchez is a 84 y.o. male presenting today at the request of [place] due to [name]. He presented to the ED in April 2019 and found to have Hgb 7.9. He states he has been losing weight since April 2019. Reports he weighed 226 in April 2019, now 210 today. Review of epic shows that he was a high of 220 most recently as of Feb 2018. Most recent Hgb 8.8 Ferritin low at 8. Per review in epic, he reportedly had Hgb 11 range in Dec 2018. 2017 no anemia.   Appetite not too good. Can't eat as much as he used to. Doesn't want much to eat. Notes occasional dysphagia. Remote EGD at the [place] in the early 2000s but states he believes it was normal. Colonoscopy in the early 2000s possibly, believes it was normal. No abdominal pain. No nausea, vomiting. Chronic GERD. Protonix daily. On Xarelto for afib. He has recently seen cardiology.   Energy has picked up but not where he has been in the past. Runs out of gas quick. No blood transfusion. Feels short of breath with activity.   No NSAIDS.  Past Medical History:  Diagnosis Date  . CAD (coronary artery disease)    a. STEMI 04/2008 s/p BMS to prox & distal RCA, staged DES to LAD same admission. b. Nuc 03/2017: scar but no ischemia, EF 45-54%.  . CKD (chronic kidney disease), stage II   . Cough, persistent   . GERD (gastroesophageal reflux disease)   . History of BPH   . HTN (hypertension)   . Hypercholesteremia   . LV dysfunction    a. EF 45-50% in 01/2016.  . Mild pulmonary hypertension ([place])   . PAF (paroxysmal atrial fibrillation) ([place])   . Sinus node dysfunction ([place])    a. s/p StJude CRT-pacemaker 01/2016.    Past Surgical History:  Procedure Laterality Date  . CORONARY STENT PLACEMENT  04/2008   RCA and LAD  . EP IMPLANTABLE DEVICE N/A 01/09/2016   Procedure: BiV Pacemaker Insertion CRT-P;  Surgeon: [name], MD;  Location: [facility];  Service: Cardiovascular;  Laterality: N/A;    Current Outpatient Medications  Medication Sig Dispense Refill  . acetaminophen (TYLENOL) 500 MG tablet Take 500 mg by mouth every 6 (six) hours as needed for pain.    . cholecalciferol (VITAMIN D) 1000 units tablet Take 1,000 Units by mouth 2 (two) times daily.    . Dorzolamide HCl-Timolol Mal PF 22.3-6.8 MG/ML SOLN Place 1 drop into the left eye 2 (two) times daily.     . ferrous sulfate 325 (65 FE) MG tablet Take 325 mg by mouth 3 (three) times daily.    . hydrochlorothiazide (HYDRODIURIL) 25 MG tablet TAKE ONE (1) TABLET EACH DAY 90 tablet 3  . latanoprost (XALATAN) 0.005 % ophthalmic solution Place 1 drop into both eyes at bedtime.    . lovastatin (MEVACOR) 10 MG tablet TAKE ONE (1) TABLET BY MOUTH EVERY DAY (Patient taking differently: TAKE ONE (1) TABLET BY MOUTH EVERY DAY IN THE EVENING) 30 tablet 11  . nitroGLYCERIN (NITROSTAT) 0.4 MG SL tablet Place 1 tablet (0.4 mg total) under the tongue every 5 (five) minutes x 3 doses as needed for chest pain. 25 tablet 3  . pantoprazole (PROTONIX)  40 MG tablet Take 40 mg by mouth daily.    [name] Propylene Glycol (SYSTANE BALANCE OP) Place 1 drop into both eyes 4 (four) times daily.     . rivaroxaban (XARELTO) 20 MG TABS tablet Take 1 tablet (20 mg total) by mouth daily with supper. Dx: paroxysmal atrial fibrillation 90 tablet 3  . tamsulosin (FLOMAX) 0.4 MG CAPS capsule Take 0.4 mg by mouth every evening.     . timolol (BETIMOL) 0.5 % ophthalmic solution Place 1 drop into the left eye 2 (two) times daily.    [name] triamcinolone cream (KENALOG) 0.1 % Apply 1 application topically daily as needed.     No current facility-administered medications for this visit.     Allergies as of 08/04/2017 - Review Complete 08/04/2017  Allergen Reaction Noted  . Ace inhibitors Nausea Only 07/18/2010  . Tape Rash  01/08/2016    Family History  Problem Relation Age of Onset  . Emphysema Mother        mother died with emphysema and cancer  . Cancer Mother   . Colon cancer Neg Hx   . Colon polyps Neg Hx     Social History   Socioeconomic History  . Marital status: Married    Spouse name: Not on file  . Number of children: Not on file  . Years of education: Not on file  . Highest education level: Not on file  Occupational History  . Occupation: retired    Comment: Geographical information systems officer  . Financial resource strain: Not on file  . Food insecurity:    Worry: Not on file    Inability: Not on file  . Transportation needs:    Medical: Not on file    Non-medical: Not on file  Tobacco Use  . Smoking status: Former Smoker    Packs/day: 2.50    Years: 25.00    Pack years: 62.50    Types: Cigarettes    Last attempt to quit: 06/14/1973    Years since quitting: 44.1  . Smokeless tobacco: Former Systems developer    Types: Chew    Quit date: 06/14/1973  . Tobacco comment: some/chewed very little for about 2 years  Substance and Sexual Activity  . Alcohol use: No  . Drug use: No  . Sexual activity: Not on file  Lifestyle  . Physical activity:    Days per week: Not on file    Minutes per session: Not on file  . Stress: Not on file  Relationships  . Social connections:    Talks on phone: Not on file    Gets together: Not on file    Attends religious service: Not on file    Active member of club or organization: Not on file    Attends meetings of clubs or organizations: Not on file    Relationship status: Not on file  . Intimate partner violence:    Fear of current or ex partner: Not on file    Emotionally abused: Not on file    Physically abused: Not on file    Forced sexual activity: Not on file  Other Topics Concern  . 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     Review of Systems: Gen: see HPI  CV: Denies chest pain, heart palpitations,  peripheral edema, syncope.  Resp: Denies shortness of breath at rest or with exertion. Denies wheezing or cough.  GI: see HPI  GU : Denies urinary  burning, urinary frequency, urinary hesitancy MS: Denies joint pain, muscle weakness, cramps, or limitation of movement.  Derm: Denies rash, itching, dry skin Psych: Denies depression, anxiety, memory loss, and confusion Heme: see HPI   Physical Exam: BP 138/81   Pulse 86   Temp (!) 96.8 F (36 C) (Oral)   Ht 6\' 1"  (1.854 m)   Wt 210 lb 3.2 oz (95.3 kg)   BMI 27.73 kg/m  General:   Alert and oriented. Pleasant and cooperative. Well-nourished and well-developed.  Head:  Normocephalic and atraumatic. Eyes:  Without icterus, sclera clear and conjunctiva pink.  Ears:  Normal auditory acuity. Nose:  No deformity, discharge,  or lesions. Mouth:  No deformity or lesions, oral mucosa pink.  Lungs:  Clear to auscultation bilaterally.  Heart:  S1, S2 present without murmurs appreciated.  Abdomen:  +BS, soft, non-tender and non-distended. No HSM noted. No guarding or rebound. No masses appreciated.  Rectal:  Deferred  Msk:  Symmetrical without gross deformities. Normal posture. Extremities:  Without edema. Neurologic:  Alert and  oriented x4 Psych:  Alert and cooperative. Normal mood and affect.  Lab Results  Component Value Date   WBC 5.6 07/15/2017   HGB 8.8 (L) 07/15/2017   HCT 30.8 (L) 07/15/2017   MCV 81.3 07/15/2017   PLT 219 07/15/2017   Lab Results  Component Value Date   IRON 32 (L) 07/15/2017   TIBC 445 07/15/2017   FERRITIN 8 (L) 07/15/2017

## 2017-08-04 NOTE — Patient Instructions (Addendum)
We have arranged a colonoscopy, upper endoscopy, and dilation with Dr. Oneida Alar in the near future. (changed to Dr. [name] and patient updated).   Please stop Xarelto 2 days before the procedure. Please stop iron 7 days before the procedure.  We will see you in follow-up thereafter!  It was a pleasure to see you today. I strive to create trusting relationships with patients to provide genuine, compassionate, and quality care. I value your feedback. If you receive a survey regarding your visit,  I greatly appreciate you taking time to fill this out.   Annitta Needs, PhD, ANP-BC [facility]

## 2017-08-05 ENCOUNTER — Encounter: Payer: Self-pay | Admitting: Gastroenterology

## 2017-08-09 ENCOUNTER — Telehealth: Payer: Self-pay | Admitting: Gastroenterology

## 2017-08-09 NOTE — Telephone Encounter (Signed)
Please have patient repeat a CBC in about 2 weeks. History of new onset IDA. He is on Xarelto. I want to ensure he does not have worsening IDA in interim prior to colonoscopy/EGD/dilation.  [facility] clinical pool: would like to expedite this evaluation if at all possible. Is there anything sooner?

## 2017-08-09 NOTE — Assessment & Plan Note (Signed)
Vague dysphagia, early satiety noted. Weight loss concerning. EGD/dilation as planned. Continue PPI .

## 2017-08-09 NOTE — Assessment & Plan Note (Addendum)
84 year old very pleasant male with new onset IDA (recent Hgb 8.8, ferritin 8), in the setting of Xarelto. No overt GI bleeding. Per ED notes, rectal exam completed and heme negative. Last colonoscopy in the early 2000s he believes, possible EGD around this time as well. On iron and has noted improvement symptomatically since starting this. (previously Hgb 7.9 in April 2019 at time of ED presentation). Weight loss, early satiety, intermittent dysphagia is also concerning. Will need colonoscopy, EGD, and +/- dilation in near future.   Proceed with colonoscopy/EGD/dilation with Dr. [name] in the near future. The risks, benefits, and alternatives have been discussed in detail with the patient. They state understanding and desire to proceed.  Hold iron 7 days prior HOLD XARELTO 48 hours prior Repeat CBC in 2 weeks to ensure anemia is not worsening. Monitor for overt GI bleeding, as he may need to hold Xarelto until after procedures. As of note: he has a pacemaker 3 month return   ADDENDUM: labs completed August 19, 2017 at [facility]. Hgb 12.8, ferritin 19, iron 79. Hgb improved, ferritin slightly improved from previously.

## 2017-08-10 ENCOUNTER — Other Ambulatory Visit: Payer: Self-pay

## 2017-08-10 ENCOUNTER — Telehealth: Payer: Self-pay

## 2017-08-10 DIAGNOSIS — D509 Iron deficiency anemia, unspecified: Secondary | ICD-10-CM

## 2017-08-10 DIAGNOSIS — R1310 Dysphagia, unspecified: Secondary | ICD-10-CM

## 2017-08-10 NOTE — Progress Notes (Signed)
CC'D TO PCP °

## 2017-08-10 NOTE — Telephone Encounter (Addendum)
We currently don't have any openings for SLF, she is going on vacation.  Dr. [name] will be able to do the procedure on 6/28 at 1:00 pm, if patient is agreeable  Routing to [place]

## 2017-08-10 NOTE — Telephone Encounter (Signed)
Called pt, he is ok with [facility] doing his procedure. TCS/EGD/DIL scheduled for 08/27/17 at 1:00pm. He is aware to hold Iron for 7 days and Xarelto for 48 hours prior to procedure. New instructions mailed. LMOVM and informed endo scheduler to cancel previous orders.

## 2017-08-10 NOTE — Telephone Encounter (Deleted)
Referral sent to [facility] via Epic and [facility].

## 2017-08-10 NOTE — Telephone Encounter (Signed)
Nicholas Sanchez, in reference to the blood work, pt said he is going to the [place] on 08/19/2017 and would like to know if he can have CBC done there then?

## 2017-08-10 NOTE — Telephone Encounter (Signed)
That would be fine 

## 2017-08-10 NOTE — Telephone Encounter (Signed)
Dr. Jacinta Shoe,   This mutual pt has been scheduled for a colonoscopy and upper endoscopy with dilation with Dr. [name] on 08/27/2017.  Roseanne Kaufman, NP would like to know if it is [facility] for him to hold his Xarelto for 48 hours prior to procedures.  Thanks so much!  Levi Crass H. Nicole Kindred, LPN

## 2017-08-10 NOTE — Telephone Encounter (Signed)
He has never been seen by either physician, so it shouldn't be an issue to assign him with Dr. [name], if he is agreeable. I would feel more comfortable if this evaluation is expedited. Just let me know so I can update my note.

## 2017-08-10 NOTE — Telephone Encounter (Signed)
Per Talmage Coin to do his lab work at [place] on 08/19/2017. Pt is aware I am mailing the orders to him to take with him. If he does not receive it, he will call me with a fax number to fax it to.

## 2017-08-11 NOTE — Telephone Encounter (Signed)
FYI to Roseanne Kaufman, NP.

## 2017-08-19 DIAGNOSIS — H35351 Cystoid macular degeneration, right eye: Secondary | ICD-10-CM | POA: Diagnosis not present

## 2017-08-19 DIAGNOSIS — H35371 Puckering of macula, right eye: Secondary | ICD-10-CM | POA: Diagnosis not present

## 2017-08-19 DIAGNOSIS — H33012 Retinal detachment with single break, left eye: Secondary | ICD-10-CM | POA: Diagnosis not present

## 2017-08-26 ENCOUNTER — Encounter: Payer: Medicare Other | Admitting: Internal Medicine

## 2017-08-27 ENCOUNTER — Encounter (HOSPITAL_COMMUNITY)
Admission: RE | Disposition: A | Discharge status: Home / Self Care | Payer: Self-pay | Source: Ambulatory Visit | Attending: Internal Medicine

## 2017-08-27 ENCOUNTER — Encounter (HOSPITAL_COMMUNITY): Payer: Self-pay | Admitting: *Deleted

## 2017-08-27 ENCOUNTER — Ambulatory Visit (HOSPITAL_COMMUNITY)
Admission: RE | Admit: 2017-08-27 | Discharge: 2017-08-27 | Disposition: A | Discharge status: Home / Self Care | Payer: No Typology Code available for payment source | Source: Ambulatory Visit | Attending: Internal Medicine | Admitting: Internal Medicine

## 2017-08-27 ENCOUNTER — Other Ambulatory Visit: Payer: Self-pay

## 2017-08-27 DIAGNOSIS — I252 Old myocardial infarction: Secondary | ICD-10-CM | POA: Diagnosis not present

## 2017-08-27 DIAGNOSIS — R1310 Dysphagia, unspecified: Secondary | ICD-10-CM | POA: Diagnosis not present

## 2017-08-27 DIAGNOSIS — Z7901 Long term (current) use of anticoagulants: Secondary | ICD-10-CM | POA: Insufficient documentation

## 2017-08-27 DIAGNOSIS — K449 Diaphragmatic hernia without obstruction or gangrene: Secondary | ICD-10-CM | POA: Insufficient documentation

## 2017-08-27 DIAGNOSIS — N400 Enlarged prostate without lower urinary tract symptoms: Secondary | ICD-10-CM | POA: Diagnosis not present

## 2017-08-27 DIAGNOSIS — I480 Paroxysmal atrial fibrillation: Secondary | ICD-10-CM | POA: Diagnosis not present

## 2017-08-27 DIAGNOSIS — Z79899 Other long term (current) drug therapy: Secondary | ICD-10-CM | POA: Diagnosis not present

## 2017-08-27 DIAGNOSIS — R233 Spontaneous ecchymoses: Secondary | ICD-10-CM | POA: Diagnosis not present

## 2017-08-27 DIAGNOSIS — I129 Hypertensive chronic kidney disease with stage 1 through stage 4 chronic kidney disease, or unspecified chronic kidney disease: Secondary | ICD-10-CM | POA: Insufficient documentation

## 2017-08-27 DIAGNOSIS — I272 Pulmonary hypertension, unspecified: Secondary | ICD-10-CM | POA: Insufficient documentation

## 2017-08-27 DIAGNOSIS — Z87891 Personal history of nicotine dependence: Secondary | ICD-10-CM | POA: Diagnosis not present

## 2017-08-27 DIAGNOSIS — D509 Iron deficiency anemia, unspecified: Secondary | ICD-10-CM | POA: Diagnosis not present

## 2017-08-27 DIAGNOSIS — I2510 Atherosclerotic heart disease of native coronary artery without angina pectoris: Secondary | ICD-10-CM | POA: Insufficient documentation

## 2017-08-27 DIAGNOSIS — D124 Benign neoplasm of descending colon: Secondary | ICD-10-CM | POA: Insufficient documentation

## 2017-08-27 DIAGNOSIS — D120 Benign neoplasm of cecum: Secondary | ICD-10-CM | POA: Insufficient documentation

## 2017-08-27 DIAGNOSIS — E7800 Pure hypercholesterolemia, unspecified: Secondary | ICD-10-CM | POA: Diagnosis not present

## 2017-08-27 DIAGNOSIS — D122 Benign neoplasm of ascending colon: Secondary | ICD-10-CM | POA: Diagnosis not present

## 2017-08-27 DIAGNOSIS — K219 Gastro-esophageal reflux disease without esophagitis: Secondary | ICD-10-CM | POA: Diagnosis not present

## 2017-08-27 DIAGNOSIS — K3189 Other diseases of stomach and duodenum: Secondary | ICD-10-CM | POA: Diagnosis not present

## 2017-08-27 DIAGNOSIS — Z950 Presence of cardiac pacemaker: Secondary | ICD-10-CM | POA: Diagnosis not present

## 2017-08-27 DIAGNOSIS — Z8371 Family history of colonic polyps: Secondary | ICD-10-CM | POA: Insufficient documentation

## 2017-08-27 DIAGNOSIS — Z800 Family history of malignant neoplasm of digestive organs: Secondary | ICD-10-CM | POA: Insufficient documentation

## 2017-08-27 DIAGNOSIS — N182 Chronic kidney disease, stage 2 (mild): Secondary | ICD-10-CM | POA: Diagnosis not present

## 2017-08-27 DIAGNOSIS — Z955 Presence of coronary angioplasty implant and graft: Secondary | ICD-10-CM | POA: Diagnosis not present

## 2017-08-27 HISTORY — PX: ESOPHAGOGASTRODUODENOSCOPY: SHX5428

## 2017-08-27 HISTORY — DX: Presence of cardiac pacemaker: Z95.0

## 2017-08-27 HISTORY — PX: COLONOSCOPY: SHX5424

## 2017-08-27 HISTORY — PX: MALONEY DILATION: SHX5535

## 2017-08-27 LAB — HM COLONOSCOPY

## 2017-08-27 SURGERY — COLONOSCOPY
Anesthesia: Moderate Sedation

## 2017-08-27 MED ORDER — LIDOCAINE VISCOUS HCL 2 % MT SOLN
OROMUCOSAL | Status: AC
  Filled 2017-08-27: qty 15

## 2017-08-27 MED ORDER — ONDANSETRON HCL 4 MG/2ML IJ SOLN
INTRAMUSCULAR | Status: DC | PRN
  Administered 2017-08-27: 4 mg via INTRAVENOUS

## 2017-08-27 MED ORDER — LIDOCAINE VISCOUS HCL 2 % MT SOLN
OROMUCOSAL | Status: DC | PRN
  Administered 2017-08-27: 1 via OROMUCOSAL

## 2017-08-27 MED ORDER — MEPERIDINE HCL 100 MG/ML IJ SOLN
INTRAMUSCULAR | Status: DC | PRN
  Administered 2017-08-27 (×3): 25 mg

## 2017-08-27 MED ORDER — SODIUM CHLORIDE 0.9 % IV SOLN
INTRAVENOUS | Status: DC
  Administered 2017-08-27: 1000 mL via INTRAVENOUS

## 2017-08-27 MED ORDER — MIDAZOLAM HCL 5 MG/5ML IJ SOLN
INTRAMUSCULAR | Status: AC
  Filled 2017-08-27: qty 5

## 2017-08-27 MED ORDER — ONDANSETRON HCL 4 MG/2ML IJ SOLN
INTRAMUSCULAR | Status: AC
  Filled 2017-08-27: qty 2

## 2017-08-27 MED ORDER — MIDAZOLAM HCL 5 MG/5ML IJ SOLN
INTRAMUSCULAR | Status: DC | PRN
  Administered 2017-08-27 (×2): 1 mg via INTRAVENOUS
  Administered 2017-08-27: 2 mg via INTRAVENOUS
  Administered 2017-08-27: 1 mg via INTRAVENOUS

## 2017-08-27 MED ORDER — MEPERIDINE HCL 100 MG/ML IJ SOLN
INTRAMUSCULAR | Status: AC
  Filled 2017-08-27: qty 1

## 2017-08-27 NOTE — Interval H&P Note (Signed)
History and Physical Interval Note:  08/27/2017 12:49 PM  Nicholas Sanchez  has presented today for surgery, with the diagnosis of iron deficiency anemia, dysphagia  The various methods of treatment have been discussed with the patient and family. After consideration of risks, benefits and other options for treatment, the patient has consented to  Procedure(s) with comments: COLONOSCOPY (N/A) - 1:00pm ESOPHAGOGASTRODUODENOSCOPY (EGD) (N/A) MAL[place]Y DILATION (N/A) as a surgical intervention .  The patient's history has been reviewed, patient examined, no change in status, stable for surgery.  I have reviewed the patient's chart and labs.  Questions were answered to the patient's satisfaction.     Nicholas Sanchez  No change;  Xarelto held x 2 days;  EGD with possible ED and colonoscopy per plan.   The risks, benefits, limitations, imponderables and alternatives regarding both EGD and colonoscopy have been reviewed with the patient. Questions have been answered. All parties agreeable.

## 2017-08-27 NOTE — Op Note (Signed)
[facility] Patient Name: Nicholas Sanchez Procedure Date: 08/27/2017 10:26 AM MRN: [id number] Date of Birth: 23-Sep-1933 Attending MD: Norvel Richards , MD CSN: [id number] Age: 84 Admit Type: Outpatient Procedure:                Colonoscopy Indications:              Iron deficiency anemia Providers:                Norvel Richards, MD, Janeece Riggers, RN, Aram Candela Referring MD:              Medicines:                Midazolam 5 mg IV, Meperidine 100 mg IV,                            Ondansetron 4 mg IV Complications:            No immediate complications. Estimated Blood Loss:     Estimated blood loss was minimal. Procedure:                Pre-Anesthesia Assessment:                           - Prior to the procedure, a History and Physical                            was performed, and patient medications and                            allergies were reviewed. The patient's tolerance of                            previous anesthesia was also reviewed. The risks                            and benefits of the procedure and the sedation                            options and risks were discussed with the patient.                            All questions were answered, and informed consent                            was obtained. Prior Anticoagulants: The patient                            last took Xarelto (rivaroxaban) 3 days prior to the                            procedure. ASA Grade Assessment: III - A patient  with severe systemic disease. After reviewing the                            risks and benefits, the patient was deemed in                            satisfactory condition to undergo the procedure.                           After obtaining informed consent, the colonoscope                            was passed under direct vision. Throughout the                            procedure, the patient's blood  pressure, pulse, and                            oxygen saturations were monitored continuously. The                            EC-[id number] ([id number]) scope was introduced through                            the anus and advanced to the the cecum, identified                            by appendiceal orifice and ileocecal valve. The                            colonoscopy was performed without difficulty. The                            patient tolerated the procedure well. The quality                            of the bowel preparation was adequate. The                            ileocecal valve, appendiceal orifice, and rectum                            were photographed. The entire colon was well                            visualized. Scope In: 1:08:13 PM Scope Out: 2:09:19 PM Scope Withdrawal Time: 0 hours 44 minutes 4 seconds  Total Procedure Duration: 1 hour 1 minute 6 seconds  Findings:      The perianal and digital rectal examinations were normal.      25 sessile polyps were found in the descending colon, ascending colon       and cecum. The polyps were 5 to 25 mm in size. These polyps were removed       with a cold snare and hot snare.  2 x 3 cm carpet polyp in the base of       cecum was lifted away from the wall with 8 mL Elloview. hot snare       piecemeal polypectomy ensued. This polyp was felt to have been removed       completely. 2 hemostasis clips were used to seal it.[name] Resection and       retrieval were complete. Estimated blood loss was minimal.      (1) 1 cm polyp in the ascending segment was removed with hot snare and       one hemostasis clip placed. Multiple specimens submitted to the       pathologist. Impression:               - (25) 5 to 25 mm polyps in the descending colon,                            in the ascending colon and in the cecum, removed                            with a cold snare an hot snare removed. Resected                            and retrieved.  Hemostasis clips placed. Moderate Sedation:      Moderate (conscious) sedation was administered by the endoscopy nurse       and supervised by the endoscopist. The following parameters were       monitored: oxygen saturation, heart rate, blood pressure, respiratory       rate, EKG, adequacy of pulmonary ventilation, and response to care.       Total physician intraservice time was 40 minutes. Recommendation:           - Patient has a contact number available for                            emergencies. The signs and symptoms of potential                            delayed complications were discussed with the                            patient. Return to normal activities tomorrow.                            Written discharge instructions were provided to the                            patient.                           - Advance diet as tolerated.                           - Continue present medications. Resume Xarelto on                            08/30/2017                           -  Await pathology results.                           - Repeat colonoscopy date to be determined after                            pending pathology results are reviewed for                            surveillance based on pathology results.                           - Return to GI clinic in 6 months. No MRI until                            clips gone. See EGD report. Procedure Code(s):        --- Professional ---                           [phone], Colonoscopy, flexible; with removal of                            tumor(s), polyp(s), or other lesion(s) by snare                            technique                           G0500, Moderate sedation services provided by the                            same physician or other qualified health care                            professional performing a gastrointestinal                            endoscopic service that sedation supports,                             requiring the presence of an independent trained                            observer to assist in the monitoring of the                            patient's level of consciousness and physiological                            status; initial 15 minutes of intra-service time;                            patient age 91 years or older (additional time 61  be reported with [phone], as appropriate)                           [id number], Moderate sedation services provided by the                            same physician or other qualified health care                            professional performing the diagnostic or                            therapeutic service that the sedation supports,                            requiring the presence of an independent trained                            observer to assist in the monitoring of the                            patient's level of consciousness and physiological                            status; each additional 15 minutes intraservice                            time (List separately in addition to code for                            primary service)                           [phone], Moderate sedation services provided by the                            same physician or other qualified health care                            professional performing the diagnostic or                            therapeutic service that the sedation supports,                            requiring the presence of an independent trained                            observer to assist in the monitoring of the                            patient's level of consciousness and physiological                            status; each  additional 15 minutes intraservice                            time (List separately in addition to code for                            primary service) Diagnosis Code(s):        --- Professional ---                           D12.4,  Benign neoplasm of descending colon                           D12.2, Benign neoplasm of ascending colon                           D12.0, Benign neoplasm of cecum                           D50.9, Iron deficiency anemia, unspecified CPT copyright 2017 [employer]. All rights reserved. The codes documented in this report are preliminary and upon coder review may  be revised to meet current compliance requirements. Cristopher Estimable. Tanairy Payeur, MD Norvel Richards, MD 08/27/2017 2:25:27 PM This report has been signed electronically. Number of Addenda: 0

## 2017-08-27 NOTE — Discharge Instructions (Signed)
Colonoscopy Discharge Instructions  Read the instructions outlined below and refer to this sheet in the next few weeks. These discharge instructions provide you with general information on caring for yourself after you leave the hospital. Your doctor may also give you specific instructions. While your treatment has been planned according to the most current medical practices available, unavoidable complications occasionally occur. If you have any problems or questions after discharge, call Dr. [name] at [phone]. After hours and weekends, call the hospital and have the GI doctor on call paged; they will call you back ACTIVITY  You may resume your regular activity, but move at a slower pace for the next 24 hours.   Take frequent rest periods for the next 24 hours.   Walking will help get rid of the air and reduce the bloated feeling in your belly (abdomen).   No driving for 24 hours (because of the medicine (anesthesia) used during the test).    Do not sign any important legal documents or operate any machinery for 24 hours (because of the anesthesia used during the test).  NUTRITION  Drink plenty of fluids.   You may resume your normal diet as instructed by your doctor.   Begin with a light meal and progress to your normal diet. Heavy or fried foods are harder to digest and may make you feel sick to your stomach (nauseated).   Avoid alcoholic beverages for 24 hours or as instructed.  MEDICATIONS  You may resume your normal medications unless your doctor tells you otherwise.  WHAT YOU CAN EXPECT TODAY  Some feelings of bloating in the abdomen.   Passage of more gas than usual.   Spotting of blood in your stool or on the toilet paper.  FINDING OUT THE RESULTS OF YOUR TEST Not all test results are available during your visit. If your test results are not back during the visit, make an appointment with your caregiver to find out the results. Do not assume everything is normal if you  have not heard from your caregiver or the medical facility. It is important for you to follow up on all of your test results.  SEEK IMMEDIATE MEDICAL ATTENTION IF:  You have more than a spotting of blood in your stool.   Your belly is swollen (abdominal distention).   You are nauseated or vomiting.   You have a temperature over 101.   You have abdominal pain or discomfort that is severe or gets worse throughout the day.  EGD Discharge instructions Please read the instructions outlined below and refer to this sheet in the next few weeks. These discharge instructions provide you with general information on caring for yourself after you leave the hospital. Your doctor may also give you specific instructions. While your treatment has been planned according to the most current medical practices available, unavoidable complications occasionally occur. If you have any problems or questions after discharge, please call your doctor. ACTIVITY  You may resume your regular activity but move at a slower pace for the next 24 hours.   Take frequent rest periods for the next 24 hours.   Walking will help expel (get rid of) the air and reduce the bloated feeling in your abdomen.   No driving for 24 hours (because of the anesthesia (medicine) used during the test).   You may shower.   Do not sign any important legal documents or operate any machinery for 24 hours (because of the anesthesia used during the test).  NUTRITION  Drink plenty of fluids.   You may resume your normal diet.   Begin with a light meal and progress to your normal diet.   Avoid alcoholic beverages for 24 hours or as instructed by your caregiver.  MEDICATIONS  You may resume your normal medications unless your caregiver tells you otherwise.  WHAT YOU CAN EXPECT TODAY  You may experience abdominal discomfort such as a feeling of fullness or gas pains.  FOLLOW-UP  Your doctor will discuss the results of your test with  you.  SEEK IMMEDIATE MEDICAL ATTENTION IF ANY OF THE FOLLOWING OCCUR:  Excessive nausea (feeling sick to your stomach) and/or vomiting.   Severe abdominal pain and distention (swelling).   Trouble swallowing.   Temperature over 101 F (37.8 C).   Rectal bleeding or vomiting of blood.    Diverticulosis and colon polyp information provided  No MRI until clips gone  Resume Xarelto  July 1st.  Further recommendations to follow pending review of pathology report   Colon Polyps Polyps are tissue growths inside the body. Polyps can grow in many places, including the large intestine (colon). A polyp may be a round bump or a mushroom-shaped growth. You could have one polyp or several. Most colon polyps are noncancerous (benign). However, some colon polyps can become cancerous over time. What are the causes? The exact cause of colon polyps is not known. What increases the risk? This condition is more likely to develop in people who:  Have a family history of colon cancer or colon polyps.  Are older than 57 or older than 45 if they are African American.  Have inflammatory bowel disease, such as ulcerative colitis or Crohn disease.  Are overweight.  Smoke cigarettes.  Do not get enough exercise.  Drink too much alcohol.  Eat a diet that is: ? High in fat and red meat. ? Low in fiber.  Had childhood cancer that was treated with abdominal radiation.  What are the signs or symptoms? Most polyps do not cause symptoms. If you have symptoms, they may include:  Blood coming from your rectum when having a bowel movement.  Blood in your stool.The stool may look dark red or black.  A change in bowel habits, such as constipation or diarrhea.  How is this diagnosed? This condition is diagnosed with a colonoscopy. This is a procedure that uses a lighted, flexible scope to look at the inside of your colon. How is this treated? Treatment for this condition involves removing any  polyps that are found. Those polyps will then be tested for cancer. If cancer is found, your health care provider will talk to you about options for colon cancer treatment. Follow these instructions at home: Diet  Eat plenty of fiber, such as fruits, vegetables, and whole grains.  Eat foods that are high in calcium and vitamin D, such as milk, cheese, yogurt, eggs, liver, fish, and broccoli.  Limit foods high in fat, red meats, and processed meats, such as hot dogs, sausage, bacon, and lunch meats.  Maintain a healthy weight, or lose weight if recommended by your health care provider. General instructions  Do not smoke cigarettes.  Do not drink alcohol excessively.  Keep all follow-up visits as told by your health care provider. This is important. This includes keeping regularly scheduled colonoscopies. Talk to your health care provider about when you need a colonoscopy.  Exercise every day or as told by your health care provider. Contact a health care provider if:  You have  new or worsening bleeding during a bowel movement.  You have new or increased blood in your stool.  You have a change in bowel habits.  You unexpectedly lose weight.   Diverticulosis Diverticulosis is a condition that develops when small pouches (diverticula) form in the wall of the large intestine (colon). The colon is where water is absorbed and stool is formed. The pouches form when the inside layer of the colon pushes through weak spots in the outer layers of the colon. You may have a few pouches or many of them. What are the causes? The cause of this condition is not known. What increases the risk? The following factors may make you more likely to develop this condition:  Being older than age 54. Your risk for this condition increases with age. Diverticulosis is rare among people younger than age 90. By age 29, many people have it.  Eating a low-fiber diet.  Having frequent constipation.  Being  overweight.  Not getting enough exercise.  Smoking.  Taking over-the-counter pain medicines, like aspirin and ibuprofen.  Having a family history of diverticulosis.  What are the signs or symptoms? In most people, there are no symptoms of this condition. If you do have symptoms, they may include:  Bloating.  Cramps in the abdomen.  Constipation or diarrhea.  Pain in the lower left side of the abdomen.  How is this diagnosed? This condition is most often diagnosed during an exam for other colon problems. Because diverticulosis usually has no symptoms, it often cannot be diagnosed independently. This condition may be diagnosed by:  Using a flexible scope to examine the colon (colonoscopy).  Taking an X-ray of the colon after dye has been put into the colon (barium enema).  Doing a CT scan.  How is this treated? You may not need treatment for this condition if you have never developed an infection related to diverticulosis. If you have had an infection before, treatment may include:  Eating a high-fiber diet. This may include eating more fruits, vegetables, and grains.  Taking a fiber supplement.  Taking a live bacteria supplement (probiotic).  Taking medicine to relax your colon.  Taking antibiotic medicines.  Follow these instructions at home:  Drink 6-8 glasses of water or more each day to prevent constipation.  Try not to strain when you have a bowel movement.  If you have had an infection before: ? Eat more fiber as directed by your health care provider or your diet and nutrition specialist (dietitian). ? Take a fiber supplement or probiotic, if your health care provider approves.  Take over-the-counter and prescription medicines only as told by your health care provider.  If you were prescribed an antibiotic, take it as told by your health care provider. Do not stop taking the antibiotic even if you start to feel better.  Keep all follow-up visits as told  by your health care provider. This is important. Contact a health care provider if:  You have pain in your abdomen.  You have bloating.  You have cramps.  You have not had a bowel movement in 3 days. Get help right away if:  Your pain gets worse.  Your bloating becomes very bad.  You have a fever or chills, and your symptoms suddenly get worse.  You vomit.  You have bowel movements that are bloody or black.  You have bleeding from your rectum. Summary  Diverticulosis is a condition that develops when small pouches (diverticula) form in the wall of the  large intestine (colon).  You may have a few pouches or many of them.  This condition is most often diagnosed during an exam for other colon problems.  If you have had an infection related to diverticulosis, treatment may include increasing the fiber in your diet, taking supplements, or taking medicines. This information is not intended to replace advice given to you by your health care provider. Make sure you discuss any questions you have with your health care provider. Document Released: 11/14/2003 Document Revised: 01/06/2016 Document Reviewed: 01/06/2016 Elsevier Interactive Patient Education  2017 [employer].

## 2017-08-27 NOTE — Op Note (Signed)
[facility] Patient Name: Nicholas Sanchez Procedure Date: 08/27/2017 10:27 AM MRN: [id number] Date of Birth: [birth year] Attending MD: Norvel Richards , MD CSN: [id number] Age: 84 Admit Type: Outpatient Procedure:                Upper GI endoscopy Indications:              Dysphagia Providers:                Norvel Richards, MD, Janeece Riggers, RN, Aram Candela Referring MD:              Medicines:                Midazolam 2 mg IV, Meperidine 50 mg IV Complications:            No immediate complications. Estimated Blood Loss:     Estimated blood loss was minimal. Procedure:                Pre-Anesthesia Assessment:                           - Prior to the procedure, a History and Physical                            was performed, and patient medications and                            allergies were reviewed. The patient's tolerance of                            previous anesthesia was also reviewed. The risks                            and benefits of the procedure and the sedation                            options and risks were discussed with the patient.                            All questions were answered, and informed consent                            was obtained. Prior Anticoagulants: The patient                            last took Xarelto (rivaroxaban) 3 days prior to the                            procedure. ASA Grade Assessment: III - A patient                            with severe systemic disease. After reviewing the  risks and benefits, the patient was deemed in                            satisfactory condition to undergo the procedure.                           After obtaining informed consent, the endoscope was                            passed under direct vision. Throughout the                            procedure, the patient's blood pressure, pulse, and                            oxygen  saturations were monitored continuously. The                            EG-[id number] ([id number]) scope was introduced through the                            mouth, and advanced to the second part of duodenum.                            The upper GI endoscopy was accomplished without                            difficulty. The patient tolerated the procedure                            well. Scope In: 12:56:24 PM Scope Out: 1:03:07 PM Total Procedure Duration: 0 hours 6 minutes 43 seconds  Findings:      The examined esophagus was normal.      Multiple petechiae were found in the entire examined stomach. .      A small hiatal hernia was present.      The duodenal bulb and second portion of the duodenum were normal.      The exam was otherwise without abnormality.The scope was withdrawn.       Dilation was performed with a Maloney dilator with mild resistance at 56       Fr. The dilation site was examined following endoscope reinsertion and       showed no change. Estimated blood loss: none. Finally, abnormal mucosa       in the stomach was biopsied with a cold forceps for histology. Estimated       blood loss was minimal. Impression:               - Normal esophagus. Dilated.                           - Gastric petechia(e). Biopsied.                           - Small hiatal hernia.                           -  Normal duodenal bulb and second portion of the                            duodenum.                           - The examination was otherwise normal. Moderate Sedation:      Moderate (conscious) sedation was administered by the endoscopy nurse       and supervised by the endoscopist. The following parameters were       monitored: oxygen saturation, heart rate, blood pressure, respiratory       rate, EKG, adequacy of pulmonary ventilation, and response to care.       Total physician intraservice time was 12 minutes. Recommendation:           - Patient has a contact number available for                             emergencies. The signs and symptoms of potential                            delayed complications were discussed with the                            patient. Return to normal activities tomorrow.                            Written discharge instructions were provided to the                            patient.                           - Advance diet as tolerated.                           - Continue present medications.                           - No repeat upper endoscopy. See colonoscopy report.                           - Return to GI office in 6 months. Procedure Code(s):        --- Professional ---                           [phone], Esophagogastroduodenoscopy, flexible,                            transoral; with biopsy, single or multiple                           43450, Dilation of esophagus, by unguided sound or                            bougie, single or multiple passes  G0500, Moderate sedation services provided by the                            same physician or other qualified health care                            professional performing a gastrointestinal                            endoscopic service that sedation supports,                            requiring the presence of an independent trained                            observer to assist in the monitoring of the                            patient's level of consciousness and physiological                            status; initial 15 minutes of intra-service time;                            patient age 25 years or older (additional time may                            be reported with [phone], as appropriate) Diagnosis Code(s):        --- Professional ---                           K31.89, Other diseases of stomach and duodenum                           K44.9, Diaphragmatic hernia without obstruction or                            gangrene                           R13.10,  Dysphagia, unspecified CPT copyright 2017 American Medical Association. All rights reserved. The codes documented in this report are preliminary and upon coder review may  be revised to meet current compliance requirements. Cristopher Estimable. Zanaiya Calabria, MD Norvel Richards, MD 08/27/2017 2:29:02 PM This report has been signed electronically. Number of Addenda: 0

## 2017-08-31 ENCOUNTER — Telehealth: Payer: Self-pay | Admitting: Internal Medicine

## 2017-08-31 NOTE — Progress Notes (Signed)
Opened in error

## 2017-08-31 NOTE — Telephone Encounter (Signed)
Pt was calling to see if results from his colonoscopy were back yet. [phone]

## 2017-09-01 ENCOUNTER — Encounter: Payer: Self-pay | Admitting: Internal Medicine

## 2017-09-01 NOTE — Telephone Encounter (Signed)
Pt notified of results. Copy mailed to pt.

## 2017-09-06 ENCOUNTER — Encounter (HOSPITAL_COMMUNITY): Payer: Self-pay | Admitting: Internal Medicine

## 2017-09-16 ENCOUNTER — Telehealth: Payer: Self-pay | Admitting: General Practice

## 2017-09-16 NOTE — Telephone Encounter (Signed)
Routing to [place] to call the patient

## 2017-09-16 NOTE — Telephone Encounter (Signed)
Thanks. No further recommendations at this time.

## 2017-09-16 NOTE — Telephone Encounter (Signed)
I spoke with the pt, he said he is doing well, no pain or bleeding. He does spit up some green "phlegm" every once in awhile throughout the day, but other than that he is doing good. We went over his result letter and his biopsies and he understands his results. He is aware of his appt with AB on 11/09/17. He does not want to move it up right now and he will contact us if he feels like he needs to be see sooner.  He was thankful for the call.

## 2017-09-16 NOTE — Telephone Encounter (Signed)
Tried to call pt- NA- LMOM 

## 2017-09-16 NOTE — Telephone Encounter (Signed)
-----   Message from [name], MD sent at 09/16/2017  8:26 AM EDT ----- Recent upper and lower done on him. Please call him and see how he is doing. Do We need to move up his office appointment?

## 2017-09-23 ENCOUNTER — Encounter (HOSPITAL_COMMUNITY): Payer: Self-pay

## 2017-09-23 ENCOUNTER — Ambulatory Visit (HOSPITAL_COMMUNITY): Admit: 2017-09-23 | Payer: Non-veteran care | Admitting: Gastroenterology

## 2017-09-23 SURGERY — COLONOSCOPY
Anesthesia: Moderate Sedation

## 2017-10-19 ENCOUNTER — Ambulatory Visit (INDEPENDENT_AMBULATORY_CARE_PROVIDER_SITE_OTHER): Payer: Medicare Other | Admitting: *Deleted

## 2017-10-19 DIAGNOSIS — I442 Atrioventricular block, complete: Secondary | ICD-10-CM | POA: Diagnosis not present

## 2017-10-19 NOTE — Progress Notes (Signed)
Remote pacemaker transmission.   

## 2017-10-20 ENCOUNTER — Other Ambulatory Visit: Payer: Self-pay

## 2017-10-20 ENCOUNTER — Observation Stay (HOSPITAL_COMMUNITY): Payer: Medicare Other

## 2017-10-20 ENCOUNTER — Encounter (HOSPITAL_COMMUNITY): Payer: Self-pay | Admitting: Emergency Medicine

## 2017-10-20 ENCOUNTER — Emergency Department (HOSPITAL_COMMUNITY): Payer: Medicare Other

## 2017-10-20 ENCOUNTER — Inpatient Hospital Stay (HOSPITAL_COMMUNITY)
Admission: EM | Admit: 2017-10-20 | Discharge: 2017-10-23 | DRG: 246 | Disposition: A | Discharge status: Home / Self Care | Payer: Medicare Other | Attending: Cardiology | Admitting: Cardiology

## 2017-10-20 DIAGNOSIS — I2089 Other forms of angina pectoris: Secondary | ICD-10-CM

## 2017-10-20 DIAGNOSIS — I25118 Atherosclerotic heart disease of native coronary artery with other forms of angina pectoris: Secondary | ICD-10-CM | POA: Diagnosis present

## 2017-10-20 DIAGNOSIS — Z79899 Other long term (current) drug therapy: Secondary | ICD-10-CM

## 2017-10-20 DIAGNOSIS — I252 Old myocardial infarction: Secondary | ICD-10-CM

## 2017-10-20 DIAGNOSIS — E7800 Pure hypercholesterolemia, unspecified: Secondary | ICD-10-CM | POA: Diagnosis present

## 2017-10-20 DIAGNOSIS — Z7901 Long term (current) use of anticoagulants: Secondary | ICD-10-CM

## 2017-10-20 DIAGNOSIS — Z955 Presence of coronary angioplasty implant and graft: Secondary | ICD-10-CM

## 2017-10-20 DIAGNOSIS — I129 Hypertensive chronic kidney disease with stage 1 through stage 4 chronic kidney disease, or unspecified chronic kidney disease: Secondary | ICD-10-CM | POA: Diagnosis present

## 2017-10-20 DIAGNOSIS — E876 Hypokalemia: Secondary | ICD-10-CM | POA: Diagnosis not present

## 2017-10-20 DIAGNOSIS — I25119 Atherosclerotic heart disease of native coronary artery with unspecified angina pectoris: Secondary | ICD-10-CM

## 2017-10-20 DIAGNOSIS — K219 Gastro-esophageal reflux disease without esophagitis: Secondary | ICD-10-CM | POA: Diagnosis present

## 2017-10-20 DIAGNOSIS — I214 Non-ST elevation (NSTEMI) myocardial infarction: Secondary | ICD-10-CM | POA: Diagnosis not present

## 2017-10-20 DIAGNOSIS — D509 Iron deficiency anemia, unspecified: Secondary | ICD-10-CM | POA: Diagnosis present

## 2017-10-20 DIAGNOSIS — Z888 Allergy status to other drugs, medicaments and biological substances status: Secondary | ICD-10-CM

## 2017-10-20 DIAGNOSIS — I480 Paroxysmal atrial fibrillation: Secondary | ICD-10-CM | POA: Diagnosis present

## 2017-10-20 DIAGNOSIS — E785 Hyperlipidemia, unspecified: Secondary | ICD-10-CM | POA: Diagnosis not present

## 2017-10-20 DIAGNOSIS — Z87891 Personal history of nicotine dependence: Secondary | ICD-10-CM | POA: Diagnosis not present

## 2017-10-20 DIAGNOSIS — N182 Chronic kidney disease, stage 2 (mild): Secondary | ICD-10-CM | POA: Diagnosis not present

## 2017-10-20 DIAGNOSIS — Y831 Surgical operation with implant of artificial internal device as the cause of abnormal reaction of the patient, or of later complication, without mention of misadventure at the time of the procedure: Secondary | ICD-10-CM | POA: Diagnosis present

## 2017-10-20 DIAGNOSIS — Z950 Presence of cardiac pacemaker: Secondary | ICD-10-CM | POA: Diagnosis not present

## 2017-10-20 DIAGNOSIS — Z825 Family history of asthma and other chronic lower respiratory diseases: Secondary | ICD-10-CM

## 2017-10-20 DIAGNOSIS — I340 Nonrheumatic mitral (valve) insufficiency: Secondary | ICD-10-CM | POA: Diagnosis not present

## 2017-10-20 DIAGNOSIS — I208 Other forms of angina pectoris: Secondary | ICD-10-CM

## 2017-10-20 DIAGNOSIS — I272 Pulmonary hypertension, unspecified: Secondary | ICD-10-CM | POA: Diagnosis not present

## 2017-10-20 DIAGNOSIS — I495 Sick sinus syndrome: Secondary | ICD-10-CM | POA: Diagnosis not present

## 2017-10-20 DIAGNOSIS — Z91048 Other nonmedicinal substance allergy status: Secondary | ICD-10-CM | POA: Diagnosis not present

## 2017-10-20 DIAGNOSIS — R079 Chest pain, unspecified: Secondary | ICD-10-CM

## 2017-10-20 DIAGNOSIS — I25110 Atherosclerotic heart disease of native coronary artery with unstable angina pectoris: Secondary | ICD-10-CM | POA: Diagnosis not present

## 2017-10-20 DIAGNOSIS — R0789 Other chest pain: Secondary | ICD-10-CM | POA: Diagnosis present

## 2017-10-20 DIAGNOSIS — N400 Enlarged prostate without lower urinary tract symptoms: Secondary | ICD-10-CM | POA: Diagnosis not present

## 2017-10-20 DIAGNOSIS — I209 Angina pectoris, unspecified: Secondary | ICD-10-CM | POA: Diagnosis not present

## 2017-10-20 DIAGNOSIS — T82855A Stenosis of coronary artery stent, initial encounter: Principal | ICD-10-CM | POA: Diagnosis present

## 2017-10-20 DIAGNOSIS — I10 Essential (primary) hypertension: Secondary | ICD-10-CM | POA: Diagnosis present

## 2017-10-20 DIAGNOSIS — I2510 Atherosclerotic heart disease of native coronary artery without angina pectoris: Secondary | ICD-10-CM | POA: Diagnosis not present

## 2017-10-20 LAB — BASIC METABOLIC PANEL
ANION GAP: 8 (ref 5–15)
BUN: 13 mg/dL (ref 8–23)
CO2: 30 mmol/L (ref 22–32)
Calcium: 9.2 mg/dL (ref 8.9–10.3)
Chloride: 96 mmol/L — ABNORMAL LOW (ref 98–111)
Creatinine, Ser: 1.2 mg/dL (ref 0.61–1.24)
GFR, EST NON AFRICAN AMERICAN: 54 mL/min — AB (ref >60)
Glucose, Bld: 107 mg/dL — ABNORMAL HIGH (ref 70–99)
Potassium: 3.4 mmol/L — ABNORMAL LOW (ref 3.5–5.1)
SODIUM: 134 mmol/L — AB (ref 135–145)

## 2017-10-20 LAB — CBC
HCT: 42.3 % (ref 39.0–52.0)
Hemoglobin: 14.2 g/dL (ref 13.0–17.0)
MCH: 32 pg (ref 26.0–34.0)
MCHC: 33.6 g/dL (ref 30.0–36.0)
MCV: 95.3 fL (ref 78.0–100.0)
Platelets: 129 10*3/uL — ABNORMAL LOW (ref 150–400)
RBC: 4.44 MIL/uL (ref 4.22–5.81)
RDW: 14.5 % (ref 11.5–15.5)
WBC: 5.7 10*3/uL (ref 4.0–10.5)

## 2017-10-20 LAB — ECHOCARDIOGRAM LIMITED
Height: 73 in
WEIGHTICAEL: 3200 [oz_av]

## 2017-10-20 LAB — LIPASE, BLOOD: Lipase: 33 U/L (ref 11–51)

## 2017-10-20 LAB — HEPATIC FUNCTION PANEL
ALT: 13 U/L (ref 0–44)
AST: 19 U/L (ref 15–41)
Albumin: 4.3 g/dL (ref 3.5–5.0)
Alkaline Phosphatase: 36 U/L — ABNORMAL LOW (ref 38–126)
BILIRUBIN INDIRECT: 0.8 mg/dL (ref 0.3–0.9)
Bilirubin, Direct: 0.2 mg/dL (ref 0.0–0.2)
TOTAL PROTEIN: 7.4 g/dL (ref 6.5–8.1)
Total Bilirubin: 1 mg/dL (ref 0.3–1.2)

## 2017-10-20 LAB — I-STAT TROPONIN, ED: TROPONIN I, POC: 0.05 ng/mL (ref 0.00–0.08)

## 2017-10-20 LAB — TROPONIN I
TROPONIN I: 0.11 ng/mL — AB (ref <0.03)
Troponin I: 0.15 ng/mL (ref <0.03)
Troponin I: 0.13 ng/mL (ref <0.03)

## 2017-10-20 LAB — HEPARIN LEVEL (UNFRACTIONATED)

## 2017-10-20 LAB — APTT: aPTT: 39 seconds — ABNORMAL HIGH (ref 24–36)

## 2017-10-20 MED ORDER — NITROGLYCERIN 2 % TD OINT
1.0000 [in_us] | TOPICAL_OINTMENT | Freq: Once | TRANSDERMAL | Status: AC
  Administered 2017-10-20: 1 [in_us] via TOPICAL
  Filled 2017-10-20: qty 1

## 2017-10-20 MED ORDER — GI COCKTAIL ~~LOC~~: 30.0000 mL | Freq: Four times a day (QID) | ORAL | Status: DC | PRN

## 2017-10-20 MED ORDER — SODIUM CHLORIDE 0.9 % IV SOLN: 250.0000 mL | INTRAVENOUS | Status: DC | PRN

## 2017-10-20 MED ORDER — ASPIRIN 325 MG PO TABS: 325.0000 mg | ORAL_TABLET | Freq: Every day | ORAL | Status: DC

## 2017-10-20 MED ORDER — ACETAMINOPHEN 325 MG PO TABS
650.0000 mg | ORAL_TABLET | ORAL | Status: DC | PRN
  Administered 2017-10-21 – 2017-10-23 (×4): 650 mg via ORAL
  Filled 2017-10-20 (×4): qty 2

## 2017-10-20 MED ORDER — DORZOLAMIDE HCL-TIMOLOL MAL PF 22.3-6.8 MG/ML OP SOLN
1.0000 [drp] | Freq: Two times a day (BID) | OPHTHALMIC | Status: DC
  Filled 2017-10-20 (×7): qty 0.1

## 2017-10-20 MED ORDER — PANTOPRAZOLE SODIUM 40 MG PO TBEC
40.0000 mg | DELAYED_RELEASE_TABLET | Freq: Every day | ORAL | Status: DC
  Administered 2017-10-20 – 2017-10-23 (×3): 40 mg via ORAL
  Filled 2017-10-20 (×4): qty 1

## 2017-10-20 MED ORDER — TAMSULOSIN HCL 0.4 MG PO CAPS
0.4000 mg | ORAL_CAPSULE | Freq: Every evening | ORAL | Status: DC
  Administered 2017-10-20 – 2017-10-22 (×3): 0.4 mg via ORAL
  Filled 2017-10-20 (×3): qty 1

## 2017-10-20 MED ORDER — LATANOPROST 0.005 % OP SOLN
1.0000 [drp] | Freq: Every day | OPHTHALMIC | Status: DC
  Filled 2017-10-20 (×2): qty 2.5

## 2017-10-20 MED ORDER — ASPIRIN EC 81 MG PO TBEC
81.0000 mg | DELAYED_RELEASE_TABLET | Freq: Every day | ORAL | Status: DC
  Administered 2017-10-23: 09:00:00 81 mg via ORAL
  Filled 2017-10-20 (×2): qty 1

## 2017-10-20 MED ORDER — ASPIRIN 81 MG PO CHEW
81.0000 mg | CHEWABLE_TABLET | ORAL | Status: AC
  Administered 2017-10-21: 81 mg via ORAL
  Filled 2017-10-20: qty 1

## 2017-10-20 MED ORDER — SODIUM CHLORIDE 0.9% FLUSH
3.0000 mL | Freq: Two times a day (BID) | INTRAVENOUS | Status: DC
  Administered 2017-10-21 (×3): 3 mL via INTRAVENOUS

## 2017-10-20 MED ORDER — HEPARIN BOLUS VIA INFUSION: 4000.0000 [IU] | Freq: Once | INTRAVENOUS | Status: DC

## 2017-10-20 MED ORDER — MORPHINE SULFATE (PF) 2 MG/ML IV SOLN
2.0000 mg | INTRAVENOUS | Status: DC | PRN
  Administered 2017-10-22: 13:00:00 2 mg via INTRAVENOUS
  Filled 2017-10-20: qty 1

## 2017-10-20 MED ORDER — NITROGLYCERIN 0.4 MG SL SUBL: 0.4000 mg | SUBLINGUAL_TABLET | SUBLINGUAL | Status: DC | PRN

## 2017-10-20 MED ORDER — TIMOLOL HEMIHYDRATE 0.5 % OP SOLN: 1.0000 [drp] | Freq: Two times a day (BID) | OPHTHALMIC | Status: DC

## 2017-10-20 MED ORDER — POTASSIUM CHLORIDE CRYS ER 20 MEQ PO TBCR
40.0000 meq | EXTENDED_RELEASE_TABLET | Freq: Once | ORAL | Status: AC
  Administered 2017-10-20: 40 meq via ORAL

## 2017-10-20 MED ORDER — SODIUM CHLORIDE 0.9 % WEIGHT BASED INFUSION: 1.0000 mL/kg/h | INTRAVENOUS | Status: DC

## 2017-10-20 MED ORDER — METOPROLOL TARTRATE 12.5 MG HALF TABLET
12.5000 mg | ORAL_TABLET | Freq: Two times a day (BID) | ORAL | Status: DC
  Administered 2017-10-20 – 2017-10-23 (×7): 12.5 mg via ORAL
  Filled 2017-10-20 (×7): qty 1

## 2017-10-20 MED ORDER — POTASSIUM CHLORIDE CRYS ER 20 MEQ PO TBCR
EXTENDED_RELEASE_TABLET | ORAL | Status: AC
  Administered 2017-10-20: 40 meq via ORAL
  Filled 2017-10-20: qty 2

## 2017-10-20 MED ORDER — ASPIRIN 81 MG PO CHEW
324.0000 mg | CHEWABLE_TABLET | Freq: Once | ORAL | Status: AC
  Administered 2017-10-20: 324 mg via ORAL
  Filled 2017-10-20: qty 4

## 2017-10-20 MED ORDER — SODIUM CHLORIDE 0.9 % WEIGHT BASED INFUSION: 3.0000 mL/kg/h | INTRAVENOUS | Status: DC

## 2017-10-20 MED ORDER — MORPHINE SULFATE (PF) 4 MG/ML IV SOLN
4.0000 mg | Freq: Once | INTRAVENOUS | Status: AC
  Administered 2017-10-20: 4 mg via INTRAVENOUS
  Filled 2017-10-20: qty 1

## 2017-10-20 MED ORDER — SODIUM CHLORIDE 0.9% FLUSH: 3.0000 mL | INTRAVENOUS | Status: DC | PRN

## 2017-10-20 MED ORDER — HYDROCHLOROTHIAZIDE 25 MG PO TABS: 25.0000 mg | ORAL_TABLET | Freq: Every day | ORAL | Status: DC

## 2017-10-20 MED ORDER — HEPARIN (PORCINE) IN NACL 100-0.45 UNIT/ML-% IJ SOLN
1200.0000 [IU]/h | INTRAMUSCULAR | Status: DC
  Administered 2017-10-20 (×2): 1200 [IU]/h via INTRAVENOUS
  Filled 2017-10-20: qty 250

## 2017-10-20 MED ORDER — ONDANSETRON HCL 4 MG/2ML IJ SOLN: 4.0000 mg | Freq: Four times a day (QID) | INTRAMUSCULAR | Status: DC | PRN

## 2017-10-20 MED ORDER — PRAVASTATIN SODIUM 10 MG PO TABS
10.0000 mg | ORAL_TABLET | Freq: Every day | ORAL | Status: DC
  Administered 2017-10-20 – 2017-10-22 (×3): 10 mg via ORAL
  Filled 2017-10-20 (×4): qty 1

## 2017-10-20 MED ORDER — POLYVINYL ALCOHOL 1.4 % OP SOLN
Freq: Four times a day (QID) | OPHTHALMIC | Status: DC
  Administered 2017-10-21: 20:00:00 via OPHTHALMIC
  Filled 2017-10-20 (×2): qty 15

## 2017-10-20 NOTE — Consult Note (Signed)
Cardiology Consultation:   Patient ID: Nicholas Sanchez; [id number]; [birth year]   Admit date: 10/20/2017 Date of Consult: 10/20/2017  Primary Care Provider: Sinda Du, MD Primary Cardiologist: Kate Sable, MD  Primary Electrophysiologist:  Cristopher Peru MD   Patient Profile:   Nicholas Sanchez is a 84 y.o. male with a hx of CAD who is being seen today for the evaluation of chest pain at the request of Dr Roderic Palau.  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   Symptoms started this morning after getting back into bed from going to the restroom. Burning pain lowerchest/ epigastric area radiating left and right side, and down left arm. No associated SOB or diaphoresis. Not better with antacid pill. 8/10 in severity, down to 6/10 in severity with SL NG. He is unsure of total duration of episode, currently pain free. Was not positional.   Jan 2019 nuclear stress large inferior infarct, no ischemia. Moderate risk due to decreased LVEF 06/2017 echo LVEF 45-50%, cannot eval diastolic function, mild MR, PASP 55   Na 134 K 3.4 Cr 1.2 WBC 5.7 Hgb 14.2 Plt 129  EKG AV paced CXR no acute process. Chronic intersitial opacities.  Past Medical History:  Diagnosis Date  . CAD (coronary artery disease)    a. STEMI 04/2008 s/p BMS to prox & distal RCA, staged DES to LAD same admission. b. Nuc 03/2017: scar but no ischemia, EF 45-54%.  . CKD (chronic kidney disease), stage II   . Cough, persistent   . GERD (gastroesophageal reflux disease)   . History of BPH   . HTN (hypertension)   . Hypercholesteremia   . LV dysfunction    a. EF 45-50% in  01/2016.  . Mild pulmonary hypertension ([place])   . PAF (paroxysmal atrial fibrillation) ([place])   . Presence of permanent cardiac pacemaker   . Sinus node dysfunction ([place])    a. s/p StJude CRT-pacemaker 01/2016.    Past Surgical History:  Procedure Laterality Date  . CARDIAC CATHETERIZATION    . COLONOSCOPY    . COLONOSCOPY N/A 08/27/2017   Procedure: COLONOSCOPY;  Surgeon: [name], MD;  Location: [facility];  Service: Endoscopy;  Laterality: N/A;  1:00pm  . CORONARY STENT PLACEMENT  04/2008   RCA and LAD  . EP IMPLANTABLE DEVICE N/A 01/09/2016   Procedure: BiV Pacemaker Insertion CRT-P;  Surgeon: [name], MD;  Location: [facility];  Service: Cardiovascular;  Laterality: N/A;  . ESOPHAGOGASTRODUODENOSCOPY N/A 08/27/2017   Procedure: ESOPHAGOGASTRODUODENOSCOPY (EGD);  Surgeon: [name], MD;  Location: [facility];  Service: Endoscopy;  Laterality: N/A;  . INSERT / REPLACE / REMOVE PACEMAKER    . MALONEY DILATION N/A 08/27/2017   Procedure: [name] DILATION;  Surgeon: [name], MD;  Location: [facility];  Service: Endoscopy;  Laterality: N/A;      Inpatient Medications: Scheduled Meds:  Continuous Infusions:  PRN Meds:   Allergies:    Allergies  Allergen Reactions  . Ace Inhibitors Nausea Only  . Tape Rash    And Bandaids, too    Social History:   Social  History   Socioeconomic History  . Marital status: Married    Spouse name: Not on file  . Number of children: Not on file  . Years of education: Not on file  . Highest education level: Not on file  Occupational History  . Occupation: retired    Comment: Geographical information systems officer  . Financial resource strain: Not on file  . Food insecurity:    Worry: Not on file    Inability: Not on file  . Transportation needs:    Medical: Not on file    Non-medical: Not on file  Tobacco Use  . Smoking status: Former Smoker    Packs/day: 2.50    Years: 25.00    Pack years: 62.50     Types: Cigarettes    Last attempt to quit: 06/14/1973    Years since quitting: 44.3  . Smokeless tobacco: Former Systems developer    Types: Chew    Quit date: 06/14/1973  . Tobacco comment: some/chewed very little for about 2 years  Substance and Sexual Activity  . Alcohol use: No  . Drug use: No  . Sexual activity: Not on file  Lifestyle  . Physical activity:    Days per week: Not on file    Minutes per session: Not on file  . Stress: Not on file  Relationships  . Social connections:    Talks on phone: Not on file    Gets together: Not on file    Attends religious service: Not on file    Active member of club or organization: Not on file    Attends meetings of clubs or organizations: Not on file    Relationship status: Not on file  . Intimate partner violence:    Fear of current or ex partner: Not on file    Emotionally abused: Not on file    Physically abused: Not on file    Forced sexual activity: Not on file  Other Topics Concern  . 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     Family History:    Family History  Problem Relation Age of Onset  . Emphysema Mother        mother died with emphysema and cancer  . Cancer Mother   . Colon cancer Neg Hx   . Colon polyps Neg Hx      ROS:  Please see the history of present illness.  All other ROS reviewed and negative.     Physical Exam/Data:   Vitals:   10/20/17 0800 10/20/17 0830 10/20/17 0900 10/20/17 0930  BP: 113/67 111/65 122/67 115/70  Pulse: 62 62 64 65  Resp: 17 17 (!) 22 (!) 23  SpO2: 96% 94% 94% 97%  Weight:      Height:       No intake or output data in the 24 hours ending 10/20/17 0948 Filed Weights   10/20/17 0613  Weight: 90.7 kg   Body mass index is 26.39 kg/m.  General:  Well nourished, well developed, in no acute distress HEENT: normal Lymph: no adenopathy Neck: no JVD Cardiac:  normal S1, S2; irreg; no murmur  Lungs:  clear to auscultation bilaterally,  no wheezing, rhonchi or rales  Abd: soft, nontender, no hepatomegaly  Ext: no edema Musculoskeletal:  No deformities, BUE and BLE strength normal and equal Skin: warm and dry  Neuro:  CNs 2-12 intact, no focal abnormalities noted Psych:  Normal affect    Laboratory  Data:  Chemistry Recent Labs  Lab 10/20/17 0615  NA 134*  K 3.4*  CL 96*  CO2 30  GLUCOSE 107*  BUN 13  CREATININE 1.20  CALCIUM 9.2  GFRNONAA 54*  GFRAA >60  ANIONGAP 8    No results for input(s): PROT, ALBUMIN, AST, ALT, ALKPHOS, BILITOT in the last 168 hours. Hematology Recent Labs  Lab 10/20/17 0615  WBC 5.7  RBC 4.44  HGB 14.2  HCT 42.3  MCV 95.3  MCH 32.0  MCHC 33.6  RDW 14.5  PLT 129*   Cardiac EnzymesNo results for input(s): TROPONINI in the last 168 hours.  Recent Labs  Lab 10/20/17 0631  TROPIPOC 0.05    BNPNo results for input(s): BNP, PROBNP in the last 168 hours.  DDimer No results for input(s): DDIMER in the last 168 hours.  Radiology/Studies:  Dg Chest 2 View  Result Date: 10/20/2017 CLINICAL DATA:  Chest pain, onset this morning. EXAM: CHEST - 2 VIEW COMPARISON:  Radiograph 07/22/2017, 06/29/2017 FINDINGS: Left-sided pacemaker in place. Unchanged cardiomegaly mediastinal contours. Aortic atherosclerosis. Chronic interstitial opacities with fine reticulation on the left, unchanged from prior exams. No focal airspace disease, pleural effusion or pneumothorax. No acute osseous abnormalities. IMPRESSION: No acute findings. Chronic interstitial opacities are unchanged from prior exam, suggesting interstitial lung disease. Electronically Signed   By: Jeb Levering M.D.   On: 10/20/2017 07:01    Assessment and Plan:   1. CAD/Chest pain - presents with episode of lower chest/epigastric pain. History of CAD with prior stents - currently pain free. Trops neg x 1. EKG is paced and not interpretable for ischemia.  - at this time needs further information to decide on ischemic testing.  Follow trop trend, obtain echo. Would consider invasive vs noninsvaive pending further data - medical therapy with statin, ASA. He has ACE-I allergy. Start low dose lopressor, could consider ARB this admission. Will start hep gtt until he is ruled out. May feed today, make npo tonight.    2. PAF - hold xarelto for possible cath  3. Pacemaker - no acute issues  For questions or updates, please contact [facility] Please consult [email or web address] for contact info under Cardiology/STEMI.   Merrily Pew, MD  10/20/2017 9:48 AM

## 2017-10-20 NOTE — H&P (Signed)
History and Physical    Nicholas Sanchez ZOX:[id number] DOB: Apr 26, 1933 DOA: 10/20/2017  PCP: Sinda Du, MD  Patient coming from: home  I have personally briefly reviewed patient's old medical records in [place]  Chief Complaint: chest discomfort  HPI: Nicholas Sanchez is a 84 y.o. male with medical history significant of coronary artery disease status post stenting in the past, most recently had a stress test done in 03/2017 showing findings consistent with large prior MI, no active ischemia, hypertension, hyperlipidemia, paroxysmal atrial fibrillation, status post pacemaker for sinus node dysfunction.  Patient reports that he was in his usual state of health when this morning he got up to go to the bathroom and upon returning noticed that he had pain across his upper abdomen/lower chest.  He reports symptoms radiated to his left shoulder and down his left arm.  Denies any shortness of breath, nausea, diaphoresis.  He did take a nitroglycerin which helped alleviate his symptoms.  Symptoms further improved after receiving a dose of morphine in the emergency room.  He is not had any fever.  He does have a chronic cough which appears to be unchanged.  ED Course: EKG showed paced rhythm.  Troponin was 0.05.  Chest x-ray did not show any acute findings.  Remainder of lab work was unrevealing.  Review of Systems: As per HPI otherwise 10 point review of systems negative.    Past Medical History:  Diagnosis Date  . CAD (coronary artery disease)    a. STEMI 04/2008 s/p BMS to prox & distal RCA, staged DES to LAD same admission. b. Nuc 03/2017: scar but no ischemia, EF 45-54%.  . CKD (chronic kidney disease), stage II   . Cough, persistent   . GERD (gastroesophageal reflux disease)   . History of BPH   . HTN (hypertension)   . Hypercholesteremia   . LV dysfunction    a. EF 45-50% in 01/2016.  . Mild pulmonary hypertension ([place])   . PAF (paroxysmal atrial fibrillation) ([place])   .  Presence of permanent cardiac pacemaker   . Sinus node dysfunction ([place])    a. s/p StJude CRT-pacemaker 01/2016.    Past Surgical History:  Procedure Laterality Date  . CARDIAC CATHETERIZATION    . COLONOSCOPY    . COLONOSCOPY N/A 08/27/2017   Procedure: COLONOSCOPY;  Surgeon: [name], MD;  [facility];  Service: Endoscopy;  Laterality: N/A;  1:00pm  . CORONARY STENT PLACEMENT  04/2008   RCA and LAD  . EP IMPLANTABLE DEVICE N/A 01/09/2016   Procedure: BiV Pacemaker Insertion CRT-P;  Surgeon: [name], MD;  Location: [place] [facility];  Service: Cardiovascular;  Laterality: N/A;  . ESOPHAGOGASTRODUODENOSCOPY N/A 08/27/2017   Procedure: ESOPHAGOGASTRODUODENOSCOPY (EGD);  Surgeon: [name], MD;  [facility];  Service: Endoscopy;  Laterality: N/A;  . INSERT / REPLACE / REMOVE PACEMAKER    . MALONEY DILATION N/A 08/27/2017   Procedure: [name] DILATION;  Surgeon: [name], MD;  [facility];  Service: Endoscopy;  Laterality: N/A;   Social History:  reports that he quit smoking about 44 years ago. His smoking use included cigarettes. He has a 62.50 pack-year smoking history. He quit smokeless tobacco use about 44 years ago.  His smokeless tobacco use included chew. He reports that he does not drink alcohol or use drugs.  Allergies  Allergen Reactions  . Ace Inhibitors Nausea Only  . Tape Rash    And  Bandaids, too    Family History  Problem Relation Age of Onset  . Emphysema Mother        mother died with emphysema and cancer  . Cancer Mother   . Colon cancer Neg Hx   . Colon polyps Neg Hx      Prior to Admission medications   Medication Sig Start Date End Date Taking? Authorizing Provider  acetaminophen (TYLENOL) 500 MG tablet Take 500 mg by mouth every 6 (six) hours as needed for pain.   Yes [provider]  cholecalciferol (VITAMIN D) 1000 units tablet Take 1,000 Units by mouth 2 (two) times daily.   Yes  [provider]  Dorzolamide HCl-Timolol Mal PF 22.3-6.8 MG/ML SOLN Place 1 drop into the left eye 2 (two) times daily.    Yes [provider]  ferrous sulfate 325 (65 FE) MG tablet Take 325 mg by mouth 3 (three) times daily.   Yes [provider]  hydrochlorothiazide (HYDRODIURIL) 25 MG tablet TAKE ONE (1) TABLET EACH DAY 02/19/17  Yes Herminio Commons, MD  latanoprost (XALATAN) 0.005 % ophthalmic solution Place 1 drop into both eyes at bedtime.   Yes [provider]  lovastatin (MEVACOR) 10 MG tablet TAKE ONE (1) TABLET BY MOUTH EVERY DAY 01/05/17  Yes Herminio Commons, MD  nitroGLYCERIN (NITROSTAT) 0.4 MG SL tablet Place 1 tablet (0.4 mg total) under the tongue every 5 (five) minutes x 3 doses as needed for chest pain. 10/29/14  Yes Herminio Commons, MD  pantoprazole (PROTONIX) 40 MG tablet Take 40 mg by mouth daily.   Yes [provider]  Propylene Glycol (SYSTANE BALANCE OP) Place 1 drop into both eyes 4 (four) times daily.    Yes [provider]  rivaroxaban (XARELTO) 20 MG TABS tablet Take 1 tablet (20 mg total) by mouth daily with supper. Dx: paroxysmal atrial fibrillation 07/30/16  Yes [name], MD  tamsulosin (FLOMAX) 0.4 MG CAPS capsule Take 0.4 mg by mouth every evening.    Yes [provider]  timolol (BETIMOL) 0.5 % ophthalmic solution Place 1 drop into the left eye 2 (two) times daily.   Yes [provider]  triamcinolone cream (KENALOG) 0.1 % Apply 1 application topically daily as needed.   Yes [provider]    Physical Exam: Vitals:   10/20/17 0700 10/20/17 0730 10/20/17 0800 10/20/17 0830  BP: 126/70 115/65 113/67 111/65  Pulse: 63 (!) 59 62 62  Resp: (!) 25 14 17 17   SpO2: 95% 94% 96% 94%  Weight:      Height:        Constitutional: NAD, calm, comfortable Vitals:   10/20/17 0700 10/20/17 0730 10/20/17 0800 10/20/17 0830  BP: 126/70 115/65 113/67 111/65  Pulse: 63 (!) 59  62 62  Resp: (!) 25 14 17 17   SpO2: 95% 94% 96% 94%  Weight:      Height:       Eyes: PERRL, lids and conjunctivae normal ENMT: Mucous membranes are moist. Posterior pharynx clear of any exudate or lesions.Normal dentition.  Neck: normal, supple, no masses, no thyromegaly Respiratory: clear to auscultation bilaterally, no wheezing, no crackles. Normal respiratory effort. No accessory muscle use.  Cardiovascular: Regular rate and rhythm, no murmurs / rubs / gallops. No extremity edema. 2+ pedal pulses. No carotid bruits.  Abdomen: mild tenderness in epigastrium, no masses palpated. No hepatosplenomegaly. Bowel sounds positive.  Musculoskeletal: no clubbing / cyanosis. No joint deformity upper and lower extremities.  Good ROM, no contractures. Normal muscle tone.  Skin: no rashes, lesions, ulcers. No induration Neurologic: CN 2-12 grossly intact. Sensation intact, DTR normal. Strength 5/5 in all 4.  Psychiatric: Normal judgment and insight. Alert and oriented x 3. Normal mood.    Labs on Admission: I have personally reviewed following labs and imaging studies  CBC: Recent Labs  Lab 10/20/17 0615  WBC 5.7  HGB 14.2  HCT 42.3  MCV 95.3  PLT 315*   Basic Metabolic Panel: Recent Labs  Lab 10/20/17 0615  NA 134*  K 3.4*  CL 96*  CO2 30  GLUCOSE 107*  BUN 13  CREATININE 1.20  CALCIUM 9.2   GFR: Estimated Creatinine Clearance: 53.6 mL/min (by C-G formula based on SCr of 1.2 mg/dL). Liver Function Tests: No results for input(s): AST, ALT, ALKPHOS, BILITOT, PROT, ALBUMIN in the last 168 hours. No results for input(s): LIPASE, AMYLASE in the last 168 hours. No results for input(s): AMMONIA in the last 168 hours. Coagulation Profile: No results for input(s): INR, PROTIME in the last 168 hours. Cardiac Enzymes: No results for input(s): CKTOTAL, CKMB, CKMBINDEX, TROPONINI in the last 168 hours. BNP (last 3 results) No results for input(s): PROBNP in the last 8760  hours. HbA1C: No results for input(s): HGBA1C in the last 72 hours. CBG: No results for input(s): GLUCAP in the last 168 hours. Lipid Profile: No results for input(s): CHOL, HDL, LDLCALC, TRIG, CHOLHDL, LDLDIRECT in the last 72 hours. Thyroid Function Tests: No results for input(s): TSH, T4TOTAL, FREET4, T3FREE, THYROIDAB in the last 72 hours. Anemia Panel: No results for input(s): VITAMINB12, FOLATE, FERRITIN, TIBC, IRON, RETICCTPCT in the last 72 hours. Urine analysis: No results found for: COLORURINE, APPEARANCEUR, LABSPEC, PHURINE, GLUCOSEU, HGBUR, BILIRUBINUR, KETONESUR, PROTEINUR, UROBILINOGEN, NITRITE, LEUKOCYTESUR  Radiological Exams on Admission: Dg Chest 2 View  Result Date: 10/20/2017 CLINICAL DATA:  Chest pain, onset this morning. EXAM: CHEST - 2 VIEW COMPARISON:  Radiograph 07/22/2017, 06/29/2017 FINDINGS: Left-sided pacemaker in place. Unchanged cardiomegaly mediastinal contours. Aortic atherosclerosis. Chronic interstitial opacities with fine reticulation on the left, unchanged from prior exams. No focal airspace disease, pleural effusion or pneumothorax. No acute osseous abnormalities. IMPRESSION: No acute findings. Chronic interstitial opacities are unchanged from prior exam, suggesting interstitial lung disease. Electronically Signed   By: Jeb Levering M.D.   On: 10/20/2017 07:01    EKG: Independently reviewed. Paced rhythm  Assessment/Plan Active Problems:   HTN (hypertension)   Hypercholesteremia   GERD (gastroesophageal reflux disease)   CAD (coronary artery disease)   Paroxysmal atrial fibrillation (H[facility])   Chest pain     1. Chest pain.  Patient has typical and atypical features.  He does have a history of coronary artery disease.  We will continue on aspirin, cycle cardiac markers.  Will ask cardiology for input to see if further work-up including catheterization is necessary.  Keep the patient n.p.o. for now so that any further invasive work-up can be done  today. 2. GERD.  Continue on PPI.  Add GI cocktail as needed. 3. Hyperlipidemia.  Continue on statin 4. Paroxysmal atrial fibrillation.  Currently has paced rhythm.  He is anticoagulated with Xarelto.  Will hold further Xarelto for now in case cardiac cath is planned.  This can be resumed if no plans for cath. 5. Hypertension.  Blood pressures currently stable.  Continue on hydrochlorothiazide  DVT prophylaxis: SCDs, xarelto on hold for possible procedure  Code Status: full code  Family Communication: discussed with family at the bedside  Disposition Plan: discharge home once improved  Consults called: cardiology  Admission status: observation, telemetry   Kathie Dike MD [facility] Pager [phone]  If 7PM-7AM, please contact night-coverage [email or web address] Password [facility]  10/20/2017, 8:46 AM

## 2017-10-20 NOTE — ED Notes (Signed)
CRITICAL VALUE ALERT  Critical Value:  Troponin 0.15  Date & Time Notied:  10/20/2017, 1150 Provider Notified: Dr. Luan Pulling, then instructed to notify cardiology.  Dr. Harl Bowie with cardiology notified.  Pt presently pain free.   Orders Received/Actions taken: none at this time.

## 2017-10-20 NOTE — ED Provider Notes (Signed)
[facility] EMERGENCY DEPARTMENT Provider Note   CSN: [id number] Arrival date & time: 10/20/17  6073     History   Chief Complaint Chief Complaint  Patient presents with  . Chest Pain    HPI Nicholas Sanchez is a 84 y.o. male.  HPI  This is an 84 year old male with a history of coronary artery disease, paroxysmal atrial fibrillation, sinus node dysfunction, cardiac pacemaker, hypertension, hypercholesterolemia who presents with chest pain.  Patient reports onset of chest pain at approximately 4:45 this morning.  Patient states he got up to go to the restroom and upon returning to bed had onset of pressure-like chest pain that radiated into his left arm.  He has had similar pain in the past with prior MI in 2010.  Current pain is 5 out of 10.  He did take nitroglycerin at home with some relief of pain.  Denies any fevers, shortness of breath, diaphoresis, cough.  Denies any recent illnesses.  Primary cardiologist is Dr. Jacinta Shoe.   Chart review reveals patient had a nuclear stress test in January 2019 that showed large prior inferior MI without evidence of ischemia.  2 stents to the RCA in 1 to the LAD in 2010.  This was his last catheterization.  He also had a biventricular pacemaker placed.  Last echocardiogram was May 2019 with an EF of 45 to 50%.   Past Medical History:  Diagnosis Date  . CAD (coronary artery disease)    a. STEMI 04/2008 s/p BMS to prox & distal RCA, staged DES to LAD same admission. b. Nuc 03/2017: scar but no ischemia, EF 45-54%.  . CKD (chronic kidney disease), stage II   . Cough, persistent   . GERD (gastroesophageal reflux disease)   . History of BPH   . HTN (hypertension)   . Hypercholesteremia   . LV dysfunction    a. EF 45-50% in 01/2016.  . Mild pulmonary hypertension ([place])   . PAF (paroxysmal atrial fibrillation) ([place])   . Presence of permanent cardiac pacemaker   . Sinus node dysfunction ([place])    a. s/p StJude CRT-pacemaker 01/2016.     Patient Active Problem List   Diagnosis Date Noted  . IDA (iron deficiency anemia) 08/04/2017  . Dysphagia 08/04/2017  . Paroxysmal atrial fibrillation ([place]) 01/21/2016  . Heart block 01/08/2016  . CAD (coronary artery disease)   . HTN (hypertension)   . Hypercholesteremia   . GERD (gastroesophageal reflux disease)     Past Surgical History:  Procedure Laterality Date  . CARDIAC CATHETERIZATION    . COLONOSCOPY    . COLONOSCOPY N/A 08/27/2017   Procedure: COLONOSCOPY;  Surgeon: [name], MD;  [facility];  Service: Endoscopy;  Laterality: N/A;  1:00pm  . CORONARY STENT PLACEMENT  04/2008   RCA and LAD  . EP IMPLANTABLE DEVICE N/A 01/09/2016   Procedure: BiV Pacemaker Insertion CRT-P;  Surgeon: [name], MD;  Location: [place] CV LAB;  Service: Cardiovascular;  Laterality: N/A;  . ESOPHAGOGASTRODUODENOSCOPY N/A 08/27/2017   Procedure: ESOPHAGOGASTRODUODENOSCOPY (EGD);  Surgeon: [name], MD;  [facility];  Service: Endoscopy;  Laterality: N/A;  . INSERT / REPLACE / REMOVE PACEMAKER    . MALONEY DILATION N/A 08/27/2017   Procedure: [name] DILATION;  Surgeon: [name], MD;  [facility];  Service: Endoscopy;  Laterality: N/A;        Home Medications    Prior to Admission medications   Medication Sig  Start Date End Date Taking? Authorizing Provider  acetaminophen (TYLENOL) 500 MG tablet Take 500 mg by mouth every 6 (six) hours as needed for pain.   Yes [provider]  cholecalciferol (VITAMIN D) 1000 units tablet Take 1,000 Units by mouth 2 (two) times daily.   Yes [provider]  Dorzolamide HCl-Timolol Mal PF 22.3-6.8 MG/ML SOLN Place 1 drop into the left eye 2 (two) times daily.    Yes [provider]  ferrous sulfate 325 (65 FE) MG tablet Take 325 mg by mouth 3 (three) times daily.   Yes [provider]  hydrochlorothiazide (HYDRODIURIL) 25 MG tablet TAKE ONE (1) TABLET  EACH DAY 02/19/17  Yes Herminio Commons, MD  latanoprost (XALATAN) 0.005 % ophthalmic solution Place 1 drop into both eyes at bedtime.   Yes [provider]  lovastatin (MEVACOR) 10 MG tablet TAKE ONE (1) TABLET BY MOUTH EVERY DAY 01/05/17  Yes Herminio Commons, MD  nitroGLYCERIN (NITROSTAT) 0.4 MG SL tablet Place 1 tablet (0.4 mg total) under the tongue every 5 (five) minutes x 3 doses as needed for chest pain. 10/29/14  Yes Herminio Commons, MD  pantoprazole (PROTONIX) 40 MG tablet Take 40 mg by mouth daily.   Yes [provider]  Propylene Glycol (SYSTANE BALANCE OP) Place 1 drop into both eyes 4 (four) times daily.    Yes [provider]  rivaroxaban (XARELTO) 20 MG TABS tablet Take 1 tablet (20 mg total) by mouth daily with supper. Dx: paroxysmal atrial fibrillation 07/30/16  Yes [name], MD  tamsulosin (FLOMAX) 0.4 MG CAPS capsule Take 0.4 mg by mouth every evening.    Yes [provider]  timolol (BETIMOL) 0.5 % ophthalmic solution Place 1 drop into the left eye 2 (two) times daily.   Yes [provider]  triamcinolone cream (KENALOG) 0.1 % Apply 1 application topically daily as needed.   Yes [provider]    Family History Family History  Problem Relation Age of Onset  . Emphysema Mother        mother died with emphysema and cancer  . Cancer Mother   . Colon cancer Neg Hx   . Colon polyps Neg Hx     Social History Social History   Tobacco Use  . Smoking status: Former Smoker    Packs/day: 2.50    Years: 25.00    Pack years: 62.50    Types: Cigarettes    Last attempt to quit: 06/14/1973    Years since quitting: 44.3  . Smokeless tobacco: Former Systems developer    Types: Chew    Quit date: 06/14/1973  . Tobacco comment: some/chewed very little for about 2 years  Substance Use Topics  . Alcohol use: No  . Drug use: No     Allergies   Ace inhibitors and Tape   Review of Systems Review of Systems   Constitutional: Negative for fever.  Respiratory: Negative for shortness of breath.   Cardiovascular: Positive for chest pain. Negative for leg swelling.  Gastrointestinal: Negative for abdominal pain, nausea and vomiting.  Genitourinary: Negative for dysuria.  Musculoskeletal: Negative for back pain.  Neurological: Negative for weakness and headaches.  All other systems reviewed and are negative.    Physical Exam Updated Vital Signs BP 126/70   Pulse 63   Resp (!) 25   Ht 6\' 1"  (1.854 m)   Wt 90.7 kg   SpO2 95%   BMI 26.39 kg/m   Physical Exam  Constitutional: He is oriented to person, place, and time.  Elderly, nontoxic-appearing  HENT:  Head: Normocephalic and atraumatic.  Eyes: Pupils are equal, round, and reactive to light.  Neck: Neck supple.  Cardiovascular: Normal rate, regular rhythm and normal heart sounds.  No murmur heard. Paced rhythm, pacemaker palpated upper chest  Pulmonary/Chest: Effort normal and breath sounds normal. No respiratory distress. He has no wheezes.  Abdominal: Soft. Bowel sounds are normal. There is no tenderness. There is no rebound.  Musculoskeletal: He exhibits no edema.       Right lower leg: He exhibits no edema.       Left lower leg: He exhibits no edema.  Lymphadenopathy:    He has no cervical adenopathy.  Neurological: He is alert and oriented to person, place, and time.  Skin: Skin is warm and dry.  Psychiatric: He has a normal mood and affect.  Nursing note and vitals reviewed.    ED Treatments / Results  Labs (all labs ordered are listed, but only abnormal results are displayed) Labs Reviewed  BASIC METABOLIC PANEL - Abnormal; Notable for the following components:      Result Value   Sodium 134 (*)    Potassium 3.4 (*)    Chloride 96 (*)    Glucose, Bld 107 (*)    GFR calc non Af Amer 54 (*)    All other components within normal limits  CBC - Abnormal; Notable for the following components:   Platelets 129 (*)     All other components within normal limits  I-STAT TROPONIN, ED    EKG EKG Interpretation  Date/Time:  Wednesday October 20 2017 06:25:47 EDT Ventricular Rate:  65 PR Interval:    QRS Duration: 148 QT Interval:  478 QTC Calculation: 498 R Axis:   -64 Text Interpretation:  Afib/flutter and ventricular-paced rhythm No further analysis attempted due to paced rhythm NO dynamic changes Confirmed by Thayer Jew [phone]) on 10/20/2017 7:20:46 AM   Radiology Dg Chest 2 View  Result Date: 10/20/2017 CLINICAL DATA:  Chest pain, onset this morning. EXAM: CHEST - 2 VIEW COMPARISON:  Radiograph 07/22/2017, 06/29/2017 FINDINGS: Left-sided pacemaker in place. Unchanged cardiomegaly mediastinal contours. Aortic atherosclerosis. Chronic interstitial opacities with fine reticulation on the left, unchanged from prior exams. No focal airspace disease, pleural effusion or pneumothorax. No acute osseous abnormalities. IMPRESSION: No acute findings. Chronic interstitial opacities are unchanged from prior exam, suggesting interstitial lung disease. Electronically Signed   By: Jeb Levering M.D.   On: 10/20/2017 07:01    Procedures Procedures (including critical care time)  Medications Ordered in ED Medications  aspirin chewable tablet 324 mg (324 mg Oral Given 10/20/17 1914)  nitroGLYCERIN (NITROGLYN) 2 % ointment 1 inch (1 inch Topical Given 10/20/17 7829)  morphine 4 MG/ML injection 4 mg (4 mg Intravenous Given 10/20/17 0702)     Initial Impression / Assessment and Plan / ED Course  I have reviewed the triage vital signs and the nursing notes.  Pertinent labs & imaging results that were available during my care of the patient were reviewed by me and considered in my medical decision making (see chart for details).  Clinical Course as of Oct 20 721  Wed Oct 20, 2017  0722 On repeat evaluation after morphine, patient reports essentially resolved pain.  Chest x-ray shows no evidence of pneumothorax  or pneumonia.  We will plan for admission to the hospitalist service with close cardiology consultation.  Patient is on Xarelto.  No indication for heparin  at this time.   [CH]    Clinical Course User Index [CH] Tavio Biegel, Barbette Hair, MD    Patient presents with chest pain.  Onset approximately 1 hour prior to arrival.  He is overall nontoxic-appearing.  Vital signs notable for mildly elevated blood pressure.  EKG does not show any evidence of acute ischemia.  He is in a paced rhythm.  Will request pacemaker interrogation.  Patient was given aspirin and nitroglycerin paste.  Initial troponin is negative at 0.05.[name]  Basic lab work is only notable for mild hypokalemia.  Story is highly concerning for ACS given patient's history and similar symptoms with prior MI.  Patient will need admission for chest chest pain rule out.  He has a cardiologist.  They will likely need to be consulted as well as patient may need to have a repeat cardiac catheterization as he has not had one in approximately 9 years.  Final Clinical Impressions(s) / ED Diagnoses   Final diagnoses:  Angina at rest [facility])    ED Discharge Orders    None       Dina Rich, Barbette Hair, MD 10/20/17 [phone]

## 2017-10-20 NOTE — Progress Notes (Addendum)
ANTICOAGULATION CONSULT NOTE - Initial Consult  Pharmacy Consult for heparin Indication: ACS/STEMI  Allergies  Allergen Reactions  . Ace Inhibitors Nausea Only  . Tape Rash    And Bandaids, too    Patient Measurements: Height: 6\' 1"  (185.4 cm) Weight: 200 lb (90.7 kg) IBW/kg (Calculated) : 79.9 Heparin Dosing Weight: 90.7 kg  Vital Signs: BP: 129/79 (08/21 1030) Pulse Rate: 59 (08/21 1030)  Labs: Recent Labs    10/20/17 0615  HGB 14.2  HCT 42.3  PLT 129*  CREATININE 1.20    Estimated Creatinine Clearance: 53.6 mL/min (by C-G formula based on SCr of 1.2 mg/dL).   Medical History: Past Medical History:  Diagnosis Date  . CAD (coronary artery disease)    a. STEMI 04/2008 s/p BMS to prox & distal RCA, staged DES to LAD same admission. b. Nuc 03/2017: scar but no ischemia, EF 45-54%.  . CKD (chronic kidney disease), stage II   . Cough, persistent   . GERD (gastroesophageal reflux disease)   . History of BPH   . HTN (hypertension)   . Hypercholesteremia   . LV dysfunction    a. EF 45-50% in 01/2016.  . Mild pulmonary hypertension ([place])   . PAF (paroxysmal atrial fibrillation) ([place])   . Presence of permanent cardiac pacemaker   . Sinus node dysfunction ([place])    a. s/p StJude CRT-pacemaker 01/2016.    Medications:   (Not in a hospital admission)  Assessment: Pharmacy consulted to dose heparin in patient with chest pain.  Patient has history of CAD/prior stents (STEMI 2010).  Patient's troponins are currently negative.  Takes Xarelto at home for atrial fibrillation with last dose 8/20 2000.  Therefore, will initiate heparin drip 24 hours after previous dose of Xarelto.  Goal of Therapy:  Heparin level 0.3-0.7 units/ml aPTT 66-102 seconds Monitor platelets by anticoagulation protocol: Yes   Plan:  Starting heparin bolus and drip at 2000- called to make sure it is stopped Start heparin infusion at 1200 units/hr Check anti-Xa level in 8 hours and daily while on  heparin Continue to monitor H&H and platelets   Revonda Standard Challis Crill 10/20/2017,11:00 AM

## 2017-10-20 NOTE — Progress Notes (Signed)
*  PRELIMINARY RESULTS* Echocardiogram Limited 2-D Echocardiogram has been performed.  Samuel Germany 10/20/2017, 12:54 PM

## 2017-10-20 NOTE — Progress Notes (Addendum)
Repeat troponin has trended up to 0.15. Patient remains pain free. We had allowed him to eat earlier since he had been npo all morning, would continue medical therapy. Would plan for cath tomorrow. Reasonable to go ahead and arrange transfer today and plan for cath tomorrow, will discuss with patient and his family.      Addendum 1212pm  Discussed with patient and his family, uptrending troponin raises concern for obsstructive CAD. Plan would be transfer to day to [place] bed with cath tomorrow. He is currently pain free and ate this morning.    I have reviewed the risks, indications, and alternatives to cardiac catheterization, possible angioplasty, and stenting with the patient and his family today. Risks include but are not limited to bleeding, infection, vascular injury, stroke, myocardial infection, arrhythmia, kidney injury, radiation-related injury in the case of prolonged fluoroscopy use, emergency cardiac surgery, and death. The patient understands the risks of serious complication is 1-2 in 3014 with diagnostic cardiac cath and 1-2% or less with angioplasty/stenting.   Carlyle Dolly MD

## 2017-10-20 NOTE — ED Triage Notes (Signed)
Patient has chest pain that started around 5 am this morning, radiating down left arm, pacemaker placed last November.

## 2017-10-20 NOTE — ED Notes (Signed)
CRITICAL VALUE ALERT  Critical Value:  Troponin 0.05 Date & Time Notied:  10/20/17  Provider Notified: [facility]   Orders Received/Actions taken: No orders at this time

## 2017-10-20 NOTE — Plan of Care (Signed)
  Problem: Education: Goal: Knowledge of General Education information will improve Description: Including pain rating scale, medication(s)/side effects and non-pharmacologic comfort measures Outcome: Progressing   Problem: Clinical Measurements: Goal: Will remain free from infection Outcome: Progressing   Problem: Safety: Goal: Ability to remain free from injury will improve Outcome: Progressing   

## 2017-10-20 NOTE — Progress Notes (Signed)
Dr. Harl Bowie notified me that PASP was 60 by echo so he discontinued pt's pre cath fluids and requests a RHC in addition to [facility]. I notified cath lab, and also updated consent order. Arnett Galindez PA-C

## 2017-10-21 ENCOUNTER — Encounter (HOSPITAL_COMMUNITY)
Admission: EM | Disposition: A | Discharge status: Home / Self Care | Payer: Self-pay | Source: Home / Self Care | Attending: Cardiology

## 2017-10-21 ENCOUNTER — Encounter (HOSPITAL_COMMUNITY): Payer: Self-pay | Admitting: General Practice

## 2017-10-21 DIAGNOSIS — T82855A Stenosis of coronary artery stent, initial encounter: Secondary | ICD-10-CM | POA: Diagnosis not present

## 2017-10-21 DIAGNOSIS — I214 Non-ST elevation (NSTEMI) myocardial infarction: Secondary | ICD-10-CM | POA: Diagnosis not present

## 2017-10-21 DIAGNOSIS — Z950 Presence of cardiac pacemaker: Secondary | ICD-10-CM | POA: Diagnosis not present

## 2017-10-21 DIAGNOSIS — I495 Sick sinus syndrome: Secondary | ICD-10-CM | POA: Diagnosis not present

## 2017-10-21 DIAGNOSIS — N182 Chronic kidney disease, stage 2 (mild): Secondary | ICD-10-CM | POA: Diagnosis not present

## 2017-10-21 DIAGNOSIS — I480 Paroxysmal atrial fibrillation: Secondary | ICD-10-CM | POA: Diagnosis not present

## 2017-10-21 DIAGNOSIS — E876 Hypokalemia: Secondary | ICD-10-CM | POA: Diagnosis not present

## 2017-10-21 DIAGNOSIS — Z825 Family history of asthma and other chronic lower respiratory diseases: Secondary | ICD-10-CM | POA: Diagnosis not present

## 2017-10-21 DIAGNOSIS — I272 Pulmonary hypertension, unspecified: Secondary | ICD-10-CM | POA: Diagnosis not present

## 2017-10-21 DIAGNOSIS — I25118 Atherosclerotic heart disease of native coronary artery with other forms of angina pectoris: Secondary | ICD-10-CM

## 2017-10-21 DIAGNOSIS — I252 Old myocardial infarction: Secondary | ICD-10-CM | POA: Diagnosis not present

## 2017-10-21 DIAGNOSIS — Z87891 Personal history of nicotine dependence: Secondary | ICD-10-CM | POA: Diagnosis not present

## 2017-10-21 DIAGNOSIS — Z7901 Long term (current) use of anticoagulants: Secondary | ICD-10-CM | POA: Diagnosis not present

## 2017-10-21 DIAGNOSIS — D509 Iron deficiency anemia, unspecified: Secondary | ICD-10-CM | POA: Diagnosis not present

## 2017-10-21 DIAGNOSIS — K219 Gastro-esophageal reflux disease without esophagitis: Secondary | ICD-10-CM | POA: Diagnosis not present

## 2017-10-21 DIAGNOSIS — E785 Hyperlipidemia, unspecified: Secondary | ICD-10-CM | POA: Diagnosis not present

## 2017-10-21 DIAGNOSIS — I340 Nonrheumatic mitral (valve) insufficiency: Secondary | ICD-10-CM | POA: Diagnosis not present

## 2017-10-21 DIAGNOSIS — Z91048 Other nonmedicinal substance allergy status: Secondary | ICD-10-CM | POA: Diagnosis not present

## 2017-10-21 DIAGNOSIS — I129 Hypertensive chronic kidney disease with stage 1 through stage 4 chronic kidney disease, or unspecified chronic kidney disease: Secondary | ICD-10-CM | POA: Diagnosis not present

## 2017-10-21 DIAGNOSIS — Z955 Presence of coronary angioplasty implant and graft: Secondary | ICD-10-CM | POA: Diagnosis not present

## 2017-10-21 DIAGNOSIS — Z888 Allergy status to other drugs, medicaments and biological substances status: Secondary | ICD-10-CM | POA: Diagnosis not present

## 2017-10-21 DIAGNOSIS — Z79899 Other long term (current) drug therapy: Secondary | ICD-10-CM | POA: Diagnosis not present

## 2017-10-21 HISTORY — PX: RIGHT/LEFT HEART CATH AND CORONARY ANGIOGRAPHY: CATH118266

## 2017-10-21 LAB — BASIC METABOLIC PANEL
ANION GAP: 6 (ref 5–15)
Anion gap: 6 (ref 5–15)
BUN: 12 mg/dL (ref 8–23)
BUN: 12 mg/dL (ref 8–23)
CALCIUM: 8.8 mg/dL — AB (ref 8.9–10.3)
CALCIUM: 8.8 mg/dL — AB (ref 8.9–10.3)
CHLORIDE: 102 mmol/L (ref 98–111)
CO2: 28 mmol/L (ref 22–32)
CO2: 26 mmol/L (ref 22–32)
CREATININE: 1.13 mg/dL (ref 0.61–1.24)
CREATININE: 1.07 mg/dL (ref 0.61–1.24)
Chloride: 102 mmol/L (ref 98–111)
GFR calc Af Amer: >60 mL/min (ref >60)
GFR calc non Af Amer: >60 mL/min (ref >60)
GFR, EST NON AFRICAN AMERICAN: 59 mL/min — AB (ref >60)
Glucose, Bld: 99 mg/dL (ref 70–99)
Glucose, Bld: 115 mg/dL — ABNORMAL HIGH (ref 70–99)
Potassium: 3.9 mmol/L (ref 3.5–5.1)
Potassium: 3.5 mmol/L (ref 3.5–5.1)
SODIUM: 136 mmol/L (ref 135–145)
SODIUM: 134 mmol/L — AB (ref 135–145)

## 2017-10-21 LAB — CBC
HCT: 39.7 % (ref 39.0–52.0)
HCT: 41.2 % (ref 39.0–52.0)
HEMOGLOBIN: 13.5 g/dL (ref 13.0–17.0)
Hemoglobin: 13 g/dL (ref 13.0–17.0)
MCH: 31.7 pg (ref 26.0–34.0)
MCH: 31.8 pg (ref 26.0–34.0)
MCHC: 32.7 g/dL (ref 30.0–36.0)
MCHC: 32.8 g/dL (ref 30.0–36.0)
MCV: 96.8 fL (ref 78.0–100.0)
MCV: 97.2 fL (ref 78.0–100.0)
PLATELETS: 134 10*3/uL — AB (ref 150–400)
Platelets: 130 10*3/uL — ABNORMAL LOW (ref 150–400)
RBC: 4.1 MIL/uL — ABNORMAL LOW (ref 4.22–5.81)
RBC: 4.24 MIL/uL (ref 4.22–5.81)
RDW: 14.2 % (ref 11.5–15.5)
RDW: 14.2 % (ref 11.5–15.5)
WBC: 5.3 10*3/uL (ref 4.0–10.5)
WBC: 5.4 10*3/uL (ref 4.0–10.5)

## 2017-10-21 LAB — POCT I-STAT 3, VENOUS BLOOD GAS (G3P V)
Acid-base deficit: 3 mmol/L — ABNORMAL HIGH (ref 0.0–2.0)
BICARBONATE: 22.6 mmol/L (ref 20.0–28.0)
O2 Saturation: 70 %
PCO2 VEN: 42.2 mmHg — AB (ref 44.0–60.0)
PH VEN: 7.337 (ref 7.250–7.430)
PO2 VEN: 39 mmHg (ref 32.0–45.0)
TCO2: 24 mmol/L (ref 22–32)

## 2017-10-21 LAB — POCT I-STAT 3, ART BLOOD GAS (G3+)
Acid-base deficit: 1 mmol/L (ref 0.0–2.0)
BICARBONATE: 24.3 mmol/L (ref 20.0–28.0)
O2 SAT: 98 %
PCO2 ART: 43.4 mmHg (ref 32.0–48.0)
TCO2: 26 mmol/L (ref 22–32)
pH, Arterial: 7.356 (ref 7.350–7.450)
pO2, Arterial: 111 mmHg — ABNORMAL HIGH (ref 83.0–108.0)

## 2017-10-21 LAB — APTT: aPTT: 84 seconds — ABNORMAL HIGH (ref 24–36)

## 2017-10-21 LAB — HEPARIN LEVEL (UNFRACTIONATED): Heparin Unfractionated: 0.63 IU/mL (ref 0.30–0.70)

## 2017-10-21 SURGERY — RIGHT/LEFT HEART CATH AND CORONARY ANGIOGRAPHY
Anesthesia: LOCAL

## 2017-10-21 MED ORDER — SODIUM CHLORIDE 0.9% FLUSH
3.0000 mL | Freq: Two times a day (BID) | INTRAVENOUS | Status: DC
  Administered 2017-10-21: 3 mL via INTRAVENOUS

## 2017-10-21 MED ORDER — VERAPAMIL HCL 2.5 MG/ML IV SOLN
INTRAVENOUS | Status: AC
  Filled 2017-10-21: qty 2

## 2017-10-21 MED ORDER — ACETAMINOPHEN 325 MG PO TABS: 650.0000 mg | ORAL_TABLET | ORAL | Status: DC | PRN

## 2017-10-21 MED ORDER — VERAPAMIL HCL 2.5 MG/ML IV SOLN
INTRAVENOUS | Status: DC | PRN
  Administered 2017-10-21: 10 mL via INTRA_ARTERIAL

## 2017-10-21 MED ORDER — SODIUM CHLORIDE 0.9 % IV SOLN
INTRAVENOUS | Status: DC
  Administered 2017-10-21: 09:00:00 via INTRAVENOUS

## 2017-10-21 MED ORDER — SODIUM CHLORIDE 0.9 % IV SOLN: 250.0000 mL | INTRAVENOUS | Status: DC | PRN

## 2017-10-21 MED ORDER — SODIUM CHLORIDE 0.9% FLUSH: 3.0000 mL | INTRAVENOUS | Status: DC | PRN

## 2017-10-21 MED ORDER — MIDAZOLAM HCL 2 MG/2ML IJ SOLN
INTRAMUSCULAR | Status: AC
  Filled 2017-10-21: qty 2

## 2017-10-21 MED ORDER — HEPARIN (PORCINE) IN NACL 100-0.45 UNIT/ML-% IJ SOLN
1200.0000 [IU]/h | INTRAMUSCULAR | Status: DC
  Administered 2017-10-22: 1200 [IU]/h via INTRAVENOUS
  Filled 2017-10-21: qty 250

## 2017-10-21 MED ORDER — IOHEXOL 350 MG/ML SOLN
INTRAVENOUS | Status: DC | PRN
  Administered 2017-10-21: 85 mL via INTRACARDIAC

## 2017-10-21 MED ORDER — LIDOCAINE HCL (PF) 1 % IJ SOLN
INTRAMUSCULAR | Status: AC
  Filled 2017-10-21: qty 30

## 2017-10-21 MED ORDER — ASPIRIN 81 MG PO CHEW
81.0000 mg | CHEWABLE_TABLET | ORAL | Status: AC
  Administered 2017-10-22: 81 mg via ORAL
  Filled 2017-10-21: qty 1

## 2017-10-21 MED ORDER — ONDANSETRON HCL 4 MG/2ML IJ SOLN: 4.0000 mg | Freq: Four times a day (QID) | INTRAMUSCULAR | Status: DC | PRN

## 2017-10-21 MED ORDER — SODIUM CHLORIDE 0.9 % WEIGHT BASED INFUSION
1.0000 mL/kg/h | INTRAVENOUS | Status: DC
  Administered 2017-10-22: 1 mL/kg/h via INTRAVENOUS

## 2017-10-21 MED ORDER — CLOPIDOGREL BISULFATE 75 MG PO TABS
150.0000 mg | ORAL_TABLET | ORAL | Status: AC
  Administered 2017-10-22: 150 mg via ORAL
  Filled 2017-10-21: qty 2

## 2017-10-21 MED ORDER — FENTANYL CITRATE (PF) 100 MCG/2ML IJ SOLN
INTRAMUSCULAR | Status: DC | PRN
  Administered 2017-10-21: 50 ug via INTRAVENOUS

## 2017-10-21 MED ORDER — FENTANYL CITRATE (PF) 100 MCG/2ML IJ SOLN
INTRAMUSCULAR | Status: AC
  Filled 2017-10-21: qty 2

## 2017-10-21 MED ORDER — MIDAZOLAM HCL 2 MG/2ML IJ SOLN
INTRAMUSCULAR | Status: DC | PRN
  Administered 2017-10-21: 1 mg via INTRAVENOUS

## 2017-10-21 MED ORDER — CLOPIDOGREL BISULFATE 75 MG PO TABS
300.0000 mg | ORAL_TABLET | Freq: Once | ORAL | Status: AC
  Administered 2017-10-21: 300 mg via ORAL
  Filled 2017-10-21: qty 4

## 2017-10-21 MED ORDER — SODIUM CHLORIDE 0.9 % WEIGHT BASED INFUSION
0.7000 mL/kg/h | INTRAVENOUS | Status: AC
  Administered 2017-10-21: 0.7 mL/kg/h via INTRAVENOUS

## 2017-10-21 MED ORDER — LIDOCAINE HCL (PF) 1 % IJ SOLN
INTRAMUSCULAR | Status: DC | PRN
  Administered 2017-10-21 (×2): 2 mL

## 2017-10-21 MED ORDER — ASPIRIN 81 MG PO CHEW: 81.0000 mg | CHEWABLE_TABLET | Freq: Every day | ORAL | Status: DC

## 2017-10-21 MED ORDER — HEPARIN (PORCINE) IN NACL 1000-0.9 UT/500ML-% IV SOLN
INTRAVENOUS | Status: DC | PRN
  Administered 2017-10-21 (×2): 500 mL

## 2017-10-21 MED ORDER — SODIUM CHLORIDE 0.9% FLUSH
3.0000 mL | Freq: Two times a day (BID) | INTRAVENOUS | Status: DC
  Administered 2017-10-22 (×2): 3 mL via INTRAVENOUS

## 2017-10-21 MED ORDER — HEPARIN SODIUM (PORCINE) 1000 UNIT/ML IJ SOLN
INTRAMUSCULAR | Status: DC | PRN
  Administered 2017-10-21: 4500 [IU] via INTRAVENOUS

## 2017-10-21 SURGICAL SUPPLY — 13 items
CATH 5FR JL3.5 JR4 ANG PIG MP (CATHETERS) ×2 IMPLANT
CATH BALLN WEDGE 5F 110CM (CATHETERS) ×2 IMPLANT
DEVICE RAD COMP TR BAND LRG (VASCULAR PRODUCTS) ×2 IMPLANT
GLIDESHEATH SLEND A-KIT 6F 22G (SHEATH) ×2 IMPLANT
GUIDEWIRE INQWIRE 1.5J.035X260 (WIRE) ×1 IMPLANT
INQWIRE 1.5J .035X260CM (WIRE) ×2
KIT HEART LEFT (KITS) ×2 IMPLANT
PACK CARDIAC CATHETERIZATION (CUSTOM PROCEDURE TRAY) ×2 IMPLANT
SHEATH GLIDE SLENDER 4/5FR (SHEATH) ×2 IMPLANT
SHEATH PROBE COVER 6X72 (BAG) ×2 IMPLANT
TRANSDUCER W/STOPCOCK (MISCELLANEOUS) ×2 IMPLANT
TUBING CIL FLEX 10 FLL-RA (TUBING) ×2 IMPLANT
WIRE EMERALD 3MM-J .025X260CM (WIRE) ×2 IMPLANT

## 2017-10-21 NOTE — H&P (View-Only) (Signed)
Progress Note  Patient Name: Nicholas Sanchez Date of Encounter: 10/21/2017  Primary Cardiologist: Kate Sable, MD   Subjective   Patient is transferred from rocking [place].  He is scheduled for heart catheterization today.  He is on heparin.  No further episodes of chest discomfort. Troponin is minimally elevated and is gradually decreasing.  Inpatient Medications    Scheduled Meds: . aspirin EC  81 mg Oral Daily  . dorzolamidel-timolol  1 drop Left Eye BID  . latanoprost  1 drop Both Eyes QHS  . metoprolol tartrate  12.5 mg Oral BID  . pantoprazole  40 mg Oral Daily  . polyvinyl alcohol   Both Eyes QID  . pravastatin  10 mg Oral q1800  . sodium chloride flush  3 mL Intravenous Q12H  . tamsulosin  0.4 mg Oral QPM   Continuous Infusions: . sodium chloride    . sodium chloride 50 mL/hr at 10/21/17 0835  . heparin 1,200 Units/hr (10/20/17 1933)   PRN Meds: sodium chloride, acetaminophen, gi cocktail, morphine injection, nitroGLYCERIN, ondansetron (ZOFRAN) IV, sodium chloride flush   Vital Signs    Vitals:   10/20/17 2208 10/21/17 0044 10/21/17 0447 10/21/17 0450  BP: (!) 150/68 139/72  136/77  Pulse: 69 61  61  Resp: 19 19  19   Temp: (!) 97.5 F (36.4 C) 99.1 F (37.3 C)  98.6 F (37 C)  TempSrc: Oral Oral  Oral  SpO2: 96% 94%  93%  Weight:   91.3 kg   Height:        Intake/Output Summary (Last 24 hours) at 10/21/2017 1143 Last data filed at 10/21/2017 0254 Gross per 24 hour  Intake 311.6 ml  Output 354 ml  Net -42.4 ml   Filed Weights   10/20/17 0613 10/21/17 0447  Weight: 90.7 kg 91.3 kg    Telemetry    NSR  - Personally Reviewed  ECG     NSR  - Personally Reviewed  Physical Exam   GEN:  elderly man,  nad ,  Pain free    Neck: No JVD Cardiac: RR   Respiratory: Clear to auscultation bilaterally. GI: Soft, nontender, non-distended  MS: No edema; No deformity. Neuro:  Nonfocal  Psych: Normal affect   Labs     Chemistry Recent Labs  Lab 10/20/17 0615 10/20/17 0920 10/21/17 0634  NA 134*  --  136  K 3.4*  --  3.9  CL 96*  --  102  CO2 30  --  28  GLUCOSE 107*  --  99  BUN 13  --  12  CREATININE 1.20  --  1.13  CALCIUM 9.2  --  8.8*  PROT  --  7.4  --   ALBUMIN  --  4.3  --   AST  --  19  --   ALT  --  13  --   ALKPHOS  --  36*  --   BILITOT  --  1.0  --   GFRNONAA 54*  --  59*  GFRAA >60  --  >60  ANIONGAP 8  --  6     Hematology Recent Labs  Lab 10/20/17 0615 10/21/17 0634  WBC 5.7 5.3  RBC 4.44 4.10*  HGB 14.2 13.0  HCT 42.3 39.7  MCV 95.3 96.8  MCH 32.0 31.7  MCHC 33.6 32.7  RDW 14.5 14.2  PLT 129* 130*    Cardiac Enzymes Recent Labs  Lab 10/20/17 1056 10/20/17 1345 10/20/17 1708  TROPONINI  0.15* 0.13* 0.11*    Recent Labs  Lab 10/20/17 0631  TROPIPOC 0.05     BNPNo results for input(s): BNP, PROBNP in the last 168 hours.   DDimer No results for input(s): DDIMER in the last 168 hours.   Radiology    Dg Chest 2 View  Result Date: 10/20/2017 CLINICAL DATA:  Chest pain, onset this morning. EXAM: CHEST - 2 VIEW COMPARISON:  Radiograph 07/22/2017, 06/29/2017 FINDINGS: Left-sided pacemaker in place. Unchanged cardiomegaly mediastinal contours. Aortic atherosclerosis. Chronic interstitial opacities with fine reticulation on the left, unchanged from prior exams. No focal airspace disease, pleural effusion or pneumothorax. No acute osseous abnormalities. IMPRESSION: No acute findings. Chronic interstitial opacities are unchanged from prior exam, suggesting interstitial lung disease. Electronically Signed   By: Jeb Levering M.D.   On: 10/20/2017 07:01    Cardiac Studies      Patient Profile     84 y.o. male admitted with CP , + troponins, Has elevated PA pressure by echo     Assessment & Plan    1.   NSTEMI:   For R /  L cath today  I've discussed cath - including risks benefits and options.  He understands and agrees to proceed.  2.   Paroxysmal atrial fibrillation: Xarelto is currently on hold.     For questions or updates, please contact [place] Please consult [email or web address] for contact info under Cardiology/STEMI.      Signed, Mertie Moores, MD  10/21/2017, 11:43 AM

## 2017-10-21 NOTE — Progress Notes (Signed)
Progress Note  Patient Name: Nicholas Sanchez Date of Encounter: 10/21/2017  Primary Cardiologist: Kate Sable, MD   Subjective   Patient is transferred from [place].  He is scheduled for heart catheterization today.  He is on heparin.  No further episodes of chest discomfort. Troponin is minimally elevated and is gradually decreasing.  Inpatient Medications    Scheduled Meds: . aspirin EC  81 mg Oral Daily  . dorzolamidel-timolol  1 drop Left Eye BID  . latanoprost  1 drop Both Eyes QHS  . metoprolol tartrate  12.5 mg Oral BID  . pantoprazole  40 mg Oral Daily  . polyvinyl alcohol   Both Eyes QID  . pravastatin  10 mg Oral q1800  . sodium chloride flush  3 mL Intravenous Q12H  . tamsulosin  0.4 mg Oral QPM   Continuous Infusions: . sodium chloride    . sodium chloride 50 mL/hr at 10/21/17 0835  . heparin 1,200 Units/hr (10/20/17 1933)   PRN Meds: sodium chloride, acetaminophen, gi cocktail, morphine injection, nitroGLYCERIN, ondansetron (ZOFRAN) IV, sodium chloride flush   Vital Signs    Vitals:   10/20/17 2208 10/21/17 0044 10/21/17 0447 10/21/17 0450  BP: (!) 150/68 139/72  136/77  Pulse: 69 61  61  Resp: 19 19  19   Temp: (!) 97.5 F (36.4 C) 99.1 F (37.3 C)  98.6 F (37 C)  TempSrc: Oral Oral  Oral  SpO2: 96% 94%  93%  Weight:   91.3 kg   Height:        Intake/Output Summary (Last 24 hours) at 10/21/2017 1143 Last data filed at 10/21/2017 7341 Gross per 24 hour  Intake 311.6 ml  Output 354 ml  Net -42.4 ml   Filed Weights   10/20/17 0613 10/21/17 0447  Weight: 90.7 kg 91.3 kg    Telemetry    NSR  - Personally Reviewed  ECG     NSR  - Personally Reviewed  Physical Exam   GEN:  elderly man,  nad ,  Pain free    Neck: No JVD Cardiac: RR   Respiratory: Clear to auscultation bilaterally. GI: Soft, nontender, non-distended  MS: No edema; No deformity. Neuro:  Nonfocal  Psych: Normal affect   Labs     Chemistry Recent Labs  Lab 10/20/17 0615 10/20/17 0920 10/21/17 0634  NA 134*  --  136  K 3.4*  --  3.9  CL 96*  --  102  CO2 30  --  28  GLUCOSE 107*  --  99  BUN 13  --  12  CREATININE 1.20  --  1.13  CALCIUM 9.2  --  8.8*  PROT  --  7.4  --   ALBUMIN  --  4.3  --   AST  --  19  --   ALT  --  13  --   ALKPHOS  --  36*  --   BILITOT  --  1.0  --   GFRNONAA 54*  --  59*  GFRAA >60  --  >60  ANIONGAP 8  --  6     Hematology Recent Labs  Lab 10/20/17 0615 10/21/17 0634  WBC 5.7 5.3  RBC 4.44 4.10*  HGB 14.2 13.0  HCT 42.3 39.7  MCV 95.3 96.8  MCH 32.0 31.7  MCHC 33.6 32.7  RDW 14.5 14.2  PLT 129* 130*    Cardiac Enzymes Recent Labs  Lab 10/20/17 1056 10/20/17 1345 10/20/17 1708  TROPONINI  0.15* 0.13* 0.11*    Recent Labs  Lab 10/20/17 0631  TROPIPOC 0.05     BNPNo results for input(s): BNP, PROBNP in the last 168 hours.   DDimer No results for input(s): DDIMER in the last 168 hours.   Radiology    Dg Chest 2 View  Result Date: 10/20/2017 CLINICAL DATA:  Chest pain, onset this morning. EXAM: CHEST - 2 VIEW COMPARISON:  Radiograph 07/22/2017, 06/29/2017 FINDINGS: Left-sided pacemaker in place. Unchanged cardiomegaly mediastinal contours. Aortic atherosclerosis. Chronic interstitial opacities with fine reticulation on the left, unchanged from prior exams. No focal airspace disease, pleural effusion or pneumothorax. No acute osseous abnormalities. IMPRESSION: No acute findings. Chronic interstitial opacities are unchanged from prior exam, suggesting interstitial lung disease. Electronically Signed   By: Jeb Levering M.D.   On: 10/20/2017 07:01    Cardiac Studies      Patient Profile     84 y.o. male admitted with CP , + troponins, Has elevated PA pressure by echo     Assessment & Plan    1.   NSTEMI:   For R /  L cath today  I've discussed cath - including risks benefits and options.  He understands and agrees to proceed.  2.   Paroxysmal atrial fibrillation: Xarelto is currently on hold.     For questions or updates, please contact [place] Please consult [email or web address] for contact info under Cardiology/STEMI.      Signed, Mertie Moores, MD  10/21/2017, 11:43 AM

## 2017-10-21 NOTE — Interval H&P Note (Signed)
Cath Lab Visit (complete for each Cath Lab visit)  Clinical Evaluation Leading to the Procedure:   ACS: Yes.    Non-ACS:    Anginal Classification: CCS III  Anti-ischemic medical therapy: Minimal Therapy (1 class of medications)  Non-Invasive Test Results: No non-invasive testing performed  Prior CABG: No previous CABG      History and Physical Interval Note:  10/21/2017 4:12 PM  Nicholas Sanchez  has presented today for surgery, with the diagnosis of nstemi  The various methods of treatment have been discussed with the patient and family. After consideration of risks, benefits and other options for treatment, the patient has consented to  Procedure(s): RIGHT/LEFT HEART CATH AND CORONARY ANGIOGRAPHY (N/A) as a surgical intervention .  The patient's history has been reviewed, patient examined, no change in status, stable for surgery.  I have reviewed the patient's chart and labs.  Questions were answered to the patient's satisfaction.     [name] III

## 2017-10-21 NOTE — Progress Notes (Signed)
ANTICOAGULATION CONSULT NOTE  Pharmacy Consult:  Heparin Indication: ACS/STEMI  Allergies  Allergen Reactions  . Ace Inhibitors Nausea Only  . Tape Rash    And Bandaids, too    Patient Measurements: Height: 6\' 1"  (185.4 cm) Weight: 201 lb 4.8 oz (91.3 kg)(Scale B) IBW/kg (Calculated) : 79.9 Heparin Dosing Weight: 90 kg  Vital Signs: Temp: 98.6 F (37 C) (08/22 0450) Temp Source: Oral (08/22 0450) BP: 136/77 (08/22 0450) Pulse Rate: 61 (08/22 0450)  Labs: Recent Labs    10/20/17 0615 10/20/17 1056 10/20/17 1143 10/20/17 1345 10/20/17 1708 10/21/17 0634  HGB 14.2  --   --   --   --  13.0  HCT 42.3  --   --   --   --  39.7  PLT 129*  --   --   --   --  130*  APTT  --   --  39*  --   --  84*  HEPARINUNFRC  --   --  >2.20*  --   --  0.63  CREATININE 1.20  --   --   --   --  1.13  TROPONINI  --  0.15*  --  0.13* 0.11*  --     Estimated Creatinine Clearance: 57 mL/min (by C-G formula based on SCr of 1.13 mg/dL).   Assessment: 48 YOM with history of Afib on Xarelto PTA, last dose on 10/19/17.  Pharmacy consulted to transition patient to IV heparin for ACS.  Both aPTT and heparin level are therapeutic, starting to correlate.  No bleeding reported.   Goal of Therapy:  Heparin level 0.3-0.7 units/ml aPTT 66-102 seconds Monitor platelets by anticoagulation protocol: Yes     Plan:  Continue heparin gtt at 1200 units/hr Daily heparin level, aPTT and CBC F/U post cath   Rayne Cowdrey D. Mina Marble, PharmD, BCPS, [place] 10/21/2017, 8:37 AM

## 2017-10-21 NOTE — Progress Notes (Signed)
ANTICOAGULATION CONSULT NOTE  Pharmacy Consult:  Heparin Indication: ACS/STEMI  Allergies  Allergen Reactions  . Ace Inhibitors Nausea Only  . Tape Rash    And Bandaids, too    Patient Measurements: Height: 6\' 1"  (185.4 cm) Weight: 201 lb 4.8 oz (91.3 kg)(Scale B) IBW/kg (Calculated) : 79.9 Heparin Dosing Weight: 90 kg  Vital Signs: Temp: 97.6 F (36.4 C) (08/22 1923) Temp Source: Oral (08/22 1923) BP: 125/70 (08/22 1923) Pulse Rate: 70 (08/22 1923)  Labs: Recent Labs    10/20/17 0615 10/20/17 1056 10/20/17 1143 10/20/17 1345 10/20/17 1708 10/21/17 0634 10/21/17 1855  HGB 14.2  --   --   --   --  13.0 13.5  HCT 42.3  --   --   --   --  39.7 41.2  PLT 129*  --   --   --   --  130* 134*  APTT  --   --  39*  --   --  84*  --   HEPARINUNFRC  --   --  >2.20*  --   --  0.63  --   CREATININE 1.20  --   --   --   --  1.13 1.07  TROPONINI  --  0.15*  --  0.13* 0.11*  --   --     Estimated Creatinine Clearance: 60.2 mL/min (by C-G formula based on SCr of 1.07 mg/dL).   Assessment: 89 YOM with history of Afib on Xarelto PTA, last dose on 10/19/17.  Pharmacy consulted to transition patient to IV heparin for ACS.  Pt now s/p LHC with planned for staged high-risk PCI. Pharmacy consulted to resume IV heparin 8-hr after sheath pull (~1500), TR band coming off ~1000 per RN.   Goal of Therapy:  Heparin level 0.3-0.7 units/ml aPTT 66-102 seconds Monitor platelets by anticoagulation protocol: Yes     Plan:  Resume heparin gtt at 1200 units/hr with no bolus at 0100 Check 8hr heparin level, aPTT  Arrie Senate, PharmD, BCPS Clinical Pharmacist [phone] Please check AMION for all [place] numbers 10/21/2017

## 2017-10-22 ENCOUNTER — Encounter (HOSPITAL_COMMUNITY): Payer: Self-pay | Admitting: Interventional Cardiology

## 2017-10-22 ENCOUNTER — Encounter (HOSPITAL_COMMUNITY)
Admission: EM | Disposition: A | Discharge status: Home / Self Care | Payer: Self-pay | Source: Home / Self Care | Attending: Cardiology

## 2017-10-22 DIAGNOSIS — Z888 Allergy status to other drugs, medicaments and biological substances status: Secondary | ICD-10-CM | POA: Diagnosis not present

## 2017-10-22 DIAGNOSIS — I252 Old myocardial infarction: Secondary | ICD-10-CM | POA: Diagnosis not present

## 2017-10-22 DIAGNOSIS — Z87891 Personal history of nicotine dependence: Secondary | ICD-10-CM | POA: Diagnosis not present

## 2017-10-22 DIAGNOSIS — Z7982 Long term (current) use of aspirin: Secondary | ICD-10-CM | POA: Diagnosis not present

## 2017-10-22 DIAGNOSIS — I214 Non-ST elevation (NSTEMI) myocardial infarction: Secondary | ICD-10-CM | POA: Diagnosis not present

## 2017-10-22 DIAGNOSIS — I129 Hypertensive chronic kidney disease with stage 1 through stage 4 chronic kidney disease, or unspecified chronic kidney disease: Secondary | ICD-10-CM | POA: Diagnosis not present

## 2017-10-22 DIAGNOSIS — Y831 Surgical operation with implant of artificial internal device as the cause of abnormal reaction of the patient, or of later complication, without mention of misadventure at the time of the procedure: Secondary | ICD-10-CM | POA: Diagnosis present

## 2017-10-22 DIAGNOSIS — E785 Hyperlipidemia, unspecified: Secondary | ICD-10-CM | POA: Diagnosis present

## 2017-10-22 DIAGNOSIS — R079 Chest pain, unspecified: Secondary | ICD-10-CM | POA: Diagnosis not present

## 2017-10-22 DIAGNOSIS — Z955 Presence of coronary angioplasty implant and graft: Secondary | ICD-10-CM | POA: Diagnosis not present

## 2017-10-22 DIAGNOSIS — N400 Enlarged prostate without lower urinary tract symptoms: Secondary | ICD-10-CM | POA: Diagnosis present

## 2017-10-22 DIAGNOSIS — Z950 Presence of cardiac pacemaker: Secondary | ICD-10-CM | POA: Diagnosis not present

## 2017-10-22 DIAGNOSIS — N182 Chronic kidney disease, stage 2 (mild): Secondary | ICD-10-CM | POA: Diagnosis present

## 2017-10-22 DIAGNOSIS — I340 Nonrheumatic mitral (valve) insufficiency: Secondary | ICD-10-CM | POA: Diagnosis present

## 2017-10-22 DIAGNOSIS — I25110 Atherosclerotic heart disease of native coronary artery with unstable angina pectoris: Secondary | ICD-10-CM | POA: Diagnosis not present

## 2017-10-22 DIAGNOSIS — Z825 Family history of asthma and other chronic lower respiratory diseases: Secondary | ICD-10-CM | POA: Diagnosis not present

## 2017-10-22 DIAGNOSIS — I495 Sick sinus syndrome: Secondary | ICD-10-CM | POA: Diagnosis not present

## 2017-10-22 DIAGNOSIS — N189 Chronic kidney disease, unspecified: Secondary | ICD-10-CM | POA: Diagnosis not present

## 2017-10-22 DIAGNOSIS — R072 Precordial pain: Secondary | ICD-10-CM | POA: Diagnosis not present

## 2017-10-22 DIAGNOSIS — R0789 Other chest pain: Secondary | ICD-10-CM | POA: Diagnosis present

## 2017-10-22 DIAGNOSIS — Z79899 Other long term (current) drug therapy: Secondary | ICD-10-CM | POA: Diagnosis not present

## 2017-10-22 DIAGNOSIS — E876 Hypokalemia: Secondary | ICD-10-CM | POA: Diagnosis present

## 2017-10-22 DIAGNOSIS — I272 Pulmonary hypertension, unspecified: Secondary | ICD-10-CM | POA: Diagnosis not present

## 2017-10-22 DIAGNOSIS — D509 Iron deficiency anemia, unspecified: Secondary | ICD-10-CM | POA: Diagnosis present

## 2017-10-22 DIAGNOSIS — Z7901 Long term (current) use of anticoagulants: Secondary | ICD-10-CM | POA: Diagnosis not present

## 2017-10-22 DIAGNOSIS — I480 Paroxysmal atrial fibrillation: Secondary | ICD-10-CM | POA: Diagnosis not present

## 2017-10-22 DIAGNOSIS — K219 Gastro-esophageal reflux disease without esophagitis: Secondary | ICD-10-CM | POA: Diagnosis present

## 2017-10-22 DIAGNOSIS — T82855A Stenosis of coronary artery stent, initial encounter: Secondary | ICD-10-CM | POA: Diagnosis not present

## 2017-10-22 DIAGNOSIS — I25118 Atherosclerotic heart disease of native coronary artery with other forms of angina pectoris: Secondary | ICD-10-CM | POA: Diagnosis present

## 2017-10-22 DIAGNOSIS — Z91048 Other nonmedicinal substance allergy status: Secondary | ICD-10-CM | POA: Diagnosis not present

## 2017-10-22 DIAGNOSIS — I2510 Atherosclerotic heart disease of native coronary artery without angina pectoris: Secondary | ICD-10-CM

## 2017-10-22 HISTORY — PX: CORONARY STENT INTERVENTION: CATH118234

## 2017-10-22 LAB — CBC
HCT: 38.4 % — ABNORMAL LOW (ref 39.0–52.0)
Hemoglobin: 12.6 g/dL — ABNORMAL LOW (ref 13.0–17.0)
MCH: 31.9 pg (ref 26.0–34.0)
MCHC: 32.8 g/dL (ref 30.0–36.0)
MCV: 97.2 fL (ref 78.0–100.0)
Platelets: 132 10*3/uL — ABNORMAL LOW (ref 150–400)
RBC: 3.95 MIL/uL — ABNORMAL LOW (ref 4.22–5.81)
RDW: 14.3 % (ref 11.5–15.5)
WBC: 5.7 10*3/uL (ref 4.0–10.5)

## 2017-10-22 LAB — BASIC METABOLIC PANEL
Anion gap: 8 (ref 5–15)
BUN: 11 mg/dL (ref 8–23)
CHLORIDE: 104 mmol/L (ref 98–111)
CO2: 22 mmol/L (ref 22–32)
Calcium: 8.5 mg/dL — ABNORMAL LOW (ref 8.9–10.3)
Creatinine, Ser: 0.99 mg/dL (ref 0.61–1.24)
GFR calc non Af Amer: >60 mL/min (ref >60)
Glucose, Bld: 86 mg/dL (ref 70–99)
POTASSIUM: 3.8 mmol/L (ref 3.5–5.1)
SODIUM: 134 mmol/L — AB (ref 135–145)

## 2017-10-22 LAB — POCT ACTIVATED CLOTTING TIME: Activated Clotting Time: 604 seconds

## 2017-10-22 LAB — HEPARIN LEVEL (UNFRACTIONATED): Heparin Unfractionated: 0.19 IU/mL — ABNORMAL LOW (ref 0.30–0.70)

## 2017-10-22 LAB — APTT: aPTT: 50 seconds — ABNORMAL HIGH (ref 24–36)

## 2017-10-22 SURGERY — CORONARY STENT INTERVENTION
Anesthesia: LOCAL

## 2017-10-22 MED ORDER — FENTANYL CITRATE (PF) 100 MCG/2ML IJ SOLN
INTRAMUSCULAR | Status: AC
  Filled 2017-10-22: qty 2

## 2017-10-22 MED ORDER — LIDOCAINE-EPINEPHRINE 1 %-1:100000 IJ SOLN
INTRAMUSCULAR | Status: AC
  Filled 2017-10-22: qty 1

## 2017-10-22 MED ORDER — NITROGLYCERIN 1 MG/10 ML FOR IR/CATH LAB
INTRA_ARTERIAL | Status: DC | PRN
  Administered 2017-10-22 (×3): 200 ug via INTRACORONARY
  Administered 2017-10-22: 150 ug via INTRACORONARY

## 2017-10-22 MED ORDER — BIVALIRUDIN BOLUS VIA INFUSION - CUPID
INTRAVENOUS | Status: DC | PRN
  Administered 2017-10-22: 68.475 mg via INTRAVENOUS

## 2017-10-22 MED ORDER — MORPHINE SULFATE (PF) 2 MG/ML IV SOLN: 2.0000 mg | Freq: Once | INTRAVENOUS | Status: AC

## 2017-10-22 MED ORDER — NITROGLYCERIN 1 MG/10 ML FOR IR/CATH LAB
INTRA_ARTERIAL | Status: AC
  Filled 2017-10-22: qty 10

## 2017-10-22 MED ORDER — LIDOCAINE-EPINEPHRINE 1 %-1:100000 IJ SOLN
INTRAMUSCULAR | Status: DC | PRN
  Administered 2017-10-22: 2 mL via INTRADERMAL

## 2017-10-22 MED ORDER — TIMOLOL MALEATE 0.5 % OP SOLN
1.0000 [drp] | Freq: Two times a day (BID) | OPHTHALMIC | Status: DC
  Filled 2017-10-22: qty 5

## 2017-10-22 MED ORDER — LABETALOL HCL 5 MG/ML IV SOLN
10.0000 mg | INTRAVENOUS | Status: AC | PRN
  Administered 2017-10-22: 10 mg via INTRAVENOUS
  Filled 2017-10-22: qty 4

## 2017-10-22 MED ORDER — CLOPIDOGREL BISULFATE 75 MG PO TABS
75.0000 mg | ORAL_TABLET | Freq: Every day | ORAL | Status: DC
  Administered 2017-10-23: 75 mg via ORAL
  Filled 2017-10-22 (×2): qty 1

## 2017-10-22 MED ORDER — MIDAZOLAM HCL 2 MG/2ML IJ SOLN
INTRAMUSCULAR | Status: AC
  Filled 2017-10-22: qty 2

## 2017-10-22 MED ORDER — BIVALIRUDIN TRIFLUOROACETATE 250 MG IV SOLR
INTRAVENOUS | Status: AC
  Filled 2017-10-22: qty 250

## 2017-10-22 MED ORDER — RIVAROXABAN 20 MG PO TABS: 20.0000 mg | ORAL_TABLET | Freq: Every day | ORAL | Status: DC

## 2017-10-22 MED ORDER — MORPHINE SULFATE (PF) 2 MG/ML IV SOLN
2.0000 mg | INTRAVENOUS | Status: AC
  Administered 2017-10-22: 2 mg via INTRAVENOUS
  Filled 2017-10-22: qty 1

## 2017-10-22 MED ORDER — HEART ATTACK BOUNCING BOOK
Freq: Once | Status: AC
  Administered 2017-10-23: 06:00:00
  Filled 2017-10-22: qty 1

## 2017-10-22 MED ORDER — SODIUM CHLORIDE 0.9% FLUSH: 3.0000 mL | INTRAVENOUS | Status: DC | PRN

## 2017-10-22 MED ORDER — ANGIOPLASTY BOOK
Freq: Once | Status: AC
  Administered 2017-10-23: 06:00:00
  Filled 2017-10-22: qty 1

## 2017-10-22 MED ORDER — LIDOCAINE HCL (PF) 1 % IJ SOLN
INTRAMUSCULAR | Status: DC | PRN
  Administered 2017-10-22: 15 mL

## 2017-10-22 MED ORDER — HEPARIN (PORCINE) IN NACL 1000-0.9 UT/500ML-% IV SOLN
INTRAVENOUS | Status: DC | PRN
  Administered 2017-10-22 (×2): 500 mL

## 2017-10-22 MED ORDER — SODIUM CHLORIDE 0.9 % IV SOLN: 250.0000 mL | INTRAVENOUS | Status: DC | PRN

## 2017-10-22 MED ORDER — SODIUM CHLORIDE 0.9% FLUSH: 3.0000 mL | Freq: Two times a day (BID) | INTRAVENOUS | Status: DC

## 2017-10-22 MED ORDER — LIDOCAINE HCL (PF) 1 % IJ SOLN
INTRAMUSCULAR | Status: AC
  Filled 2017-10-22: qty 30

## 2017-10-22 MED ORDER — MIDAZOLAM HCL 2 MG/2ML IJ SOLN
INTRAMUSCULAR | Status: DC | PRN
  Administered 2017-10-22 (×3): 1 mg via INTRAVENOUS

## 2017-10-22 MED ORDER — SODIUM CHLORIDE 0.9 % WEIGHT BASED INFUSION
1.0000 mL/kg/h | INTRAVENOUS | Status: AC
  Administered 2017-10-22: 1 mL/kg/h via INTRAVENOUS

## 2017-10-22 MED ORDER — HEPARIN (PORCINE) IN NACL 1000-0.9 UT/500ML-% IV SOLN
INTRAVENOUS | Status: AC
  Filled 2017-10-22: qty 1000

## 2017-10-22 MED ORDER — SODIUM CHLORIDE 0.9 % IV SOLN
INTRAVENOUS | Status: DC | PRN
  Administered 2017-10-22 (×2): 1.75 mg/kg/h via INTRAVENOUS

## 2017-10-22 MED ORDER — HYDRALAZINE HCL 20 MG/ML IJ SOLN
5.0000 mg | INTRAMUSCULAR | Status: AC | PRN
  Administered 2017-10-22: 15:00:00 5 mg via INTRAVENOUS
  Filled 2017-10-22 (×2): qty 1

## 2017-10-22 MED ORDER — IOHEXOL 350 MG/ML SOLN
INTRAVENOUS | Status: DC | PRN
  Administered 2017-10-22: 185 mL via INTRA_ARTERIAL

## 2017-10-22 MED ORDER — DORZOLAMIDE HCL 2 % OP SOLN
1.0000 [drp] | Freq: Two times a day (BID) | OPHTHALMIC | Status: DC
  Filled 2017-10-22: qty 10

## 2017-10-22 MED ORDER — FENTANYL CITRATE (PF) 100 MCG/2ML IJ SOLN
INTRAMUSCULAR | Status: DC | PRN
  Administered 2017-10-22 (×3): 25 ug via INTRAVENOUS

## 2017-10-22 SURGICAL SUPPLY — 29 items
BALLN MINITREK OTW 1.5X6 (BALLOONS) ×3
BALLN SAPPHIRE 2.5X12 (BALLOONS) ×3
BALLN SAPPHIRE ~~LOC~~ 2.5X15 (BALLOONS) ×3 IMPLANT
BALLN SAPPHIRE ~~LOC~~ 3.0X15 (BALLOONS) ×3 IMPLANT
BALLN SAPPHIRE ~~LOC~~ 3.75X10 (BALLOONS) ×3 IMPLANT
BALLN WOLVERINE 3.00X10 (BALLOONS) ×3
BALLOON MINITREK OTW 1.5X6 (BALLOONS) ×2 IMPLANT
BALLOON SAPPHIRE 2.5X12 (BALLOONS) ×2 IMPLANT
BALLOON WOLVERINE 3.00X10 (BALLOONS) ×2 IMPLANT
CATH LAUNCHER 6FR AL1 (CATHETERS) ×2 IMPLANT
CATHETER LAUNCHER 6FR AL1 (CATHETERS) ×3
CROWN DIAMONDBACK CLASSIC 1.25 (BURR) ×3 IMPLANT
DEVICE CLOSURE PERCLS PRGLD 6F (VASCULAR PRODUCTS) ×2 IMPLANT
ELECT DEFIB PAD ADLT CADENCE (PAD) ×3 IMPLANT
KIT ENCORE 26 ADVANTAGE (KITS) ×3 IMPLANT
KIT HEART LEFT (KITS) ×3 IMPLANT
LUBRICANT VIPERSLIDE CORONARY (MISCELLANEOUS) ×3 IMPLANT
PACK CARDIAC CATHETERIZATION (CUSTOM PROCEDURE TRAY) ×3 IMPLANT
PERCLOSE PROGLIDE 6F (VASCULAR PRODUCTS) ×3
SHEATH PINNACLE 6F 10CM (SHEATH) ×3 IMPLANT
SHEATH PROBE COVER 6X72 (BAG) ×3 IMPLANT
STENT ORSIRO 3.5X15 (Permanent Stent) ×3 IMPLANT
STENT ORSIRO 3.5X18 (Permanent Stent) ×3 IMPLANT
TRANSDUCER W/STOPCOCK (MISCELLANEOUS) ×3 IMPLANT
TUBING CIL FLEX 10 FLL-RA (TUBING) ×3 IMPLANT
WIRE COUGAR XT STRL 300CM (WIRE) ×3 IMPLANT
WIRE EMERALD 3MM-J .035X150CM (WIRE) ×3 IMPLANT
WIRE MAILMAN 300CM (WIRE) ×3 IMPLANT
WIRE VIPER ADVANCE COR .012TIP (WIRE) ×3 IMPLANT

## 2017-10-22 NOTE — Plan of Care (Signed)

## 2017-10-22 NOTE — Progress Notes (Signed)
Patient going to for coronary stent procedure. Patient is alert and oriented. No chest pain reported. Heparin has been stopped for transportation, normal saline running.  Patient was transported in bed, wife going with patient.  Telly box removed, CCMD called.

## 2017-10-22 NOTE — Interval H&P Note (Signed)
Cath Lab Visit (complete for each Cath Lab visit)  Clinical Evaluation Leading to the Procedure:   ACS: Yes.    Non-ACS:    Anginal Classification: CCS IV  Anti-ischemic medical therapy: Minimal Therapy (1 class of medications)  Non-Invasive Test Results: No non-invasive testing performed  Prior CABG: No previous CABG      History and Physical Interval Note:  10/22/2017 9:39 AM  Nicholas Sanchez  has presented today for surgery, with the diagnosis of cad  The various methods of treatment have been discussed with the patient and family. After consideration of risks, benefits and other options for treatment, the patient has consented to  Procedure(s): CORONARY STENT INTERVENTION (N/A) as a surgical intervention .  The patient's history has been reviewed, patient examined, no change in status, stable for surgery.  I have reviewed the patient's chart and labs.  Questions were answered to the patient's satisfaction.     [name]

## 2017-10-22 NOTE — Discharge Instructions (Addendum)
You will only take ASA for 1 month (30 days). You will need to stop ASA on 11/23/17   Information on my medicine - XARELTO (Rivaroxaban)  This medication education was reviewed with me or my healthcare representative as part of my discharge preparation.  The pharmacist that spoke with me during my hospital stay was:  Saundra Shelling, [facility]  Why was Xarelto prescribed for you? Xarelto was prescribed for you to reduce the risk of a blood clot forming that can cause a stroke if you have a medical condition called atrial fibrillation (a type of irregular heartbeat).  What do you need to know about xarelto ? Take your Xarelto ONCE DAILY at the same time every day with your evening meal. If you have difficulty swallowing the tablet whole, you may crush it and mix in applesauce just prior to taking your dose.  Take Xarelto exactly as prescribed by your doctor and DO NOT stop taking Xarelto without talking to the doctor who prescribed the medication.  Stopping without other stroke prevention medication to take the place of Xarelto may increase your risk of developing a clot that causes a stroke.  Refill your prescription before you run out.  After discharge, you should have regular check-up appointments with your healthcare provider that is prescribing your Xarelto.  In the future your dose may need to be changed if your kidney function or weight changes by a significant amount.  What do you do if you miss a dose? If you are taking Xarelto ONCE DAILY and you miss a dose, take it as soon as you remember on the same day then continue your regularly scheduled once daily regimen the next day. Do not take two doses of Xarelto at the same time or on the same day.   Important Safety Information A possible side effect of Xarelto is bleeding. You should call your healthcare provider right away if you experience any of the following: ? Bleeding from an injury or your nose that does not stop. ? Unusual  colored urine (red or dark brown) or unusual colored stools (red or black). ? Unusual bruising for unknown reasons. ? A serious fall or if you hit your head (even if there is no bleeding).  Some medicines may interact with Xarelto and might increase your risk of bleeding while on Xarelto. To help avoid this, consult your healthcare provider or pharmacist prior to using any new prescription or non-prescription medications, including herbals, vitamins, non-steroidal anti-inflammatory drugs (NSAIDs) and supplements.  This website has more information on Xarelto: [email or web address]-benson.[email or web address]

## 2017-10-22 NOTE — Consult Note (Signed)
            [facility] CM Primary Care Navigator  10/22/2017  Nicholas Sanchez [birth year] [id number]   Went to see patient at the bedside to identify possible discharge needs but he is off the unit in Cath Lab for procedure. (Coronary Stent Intervention)  Per MD note, he was admitted forfurther evaluation of chest pain (pain across his upper abdomen/ lower chest radiating to left shoulder and down to his left arm).  Will attempt to see patient at another time when he is available in the room.    Addendum (10/25/17):  Went back to see patient at the bedside to identify possible discharge needs but he was already discharged home over the weekend.  Per MD note, patient was transferred to [facility] from [facility] for left cardiac cath and further management of NSTEMI- non ST elevated myocardial infarction.  Patient has discharge instruction to follow-up with cardiology (Dr. Bronson Ing)- office will call patient with the hospital follow-up visit.  Primary care provider's office is listed as providing transition of care  (TOC) follow-up.   For additional questions please contact:  Edwena Felty A. Larie Mathes, BSN, RN-BC [facility] PRIMARY CARE Navigator Cell: [phone]

## 2017-10-23 ENCOUNTER — Emergency Department (HOSPITAL_COMMUNITY)
Admission: EM | Admit: 2017-10-23 | Discharge: 2017-10-23 | Disposition: A | Discharge status: Home / Self Care | Payer: Medicare Other | Attending: Emergency Medicine | Admitting: Emergency Medicine

## 2017-10-23 ENCOUNTER — Telehealth: Payer: Self-pay | Admitting: Physician Assistant

## 2017-10-23 ENCOUNTER — Encounter (HOSPITAL_COMMUNITY): Payer: Self-pay | Admitting: Emergency Medicine

## 2017-10-23 ENCOUNTER — Emergency Department (HOSPITAL_COMMUNITY): Payer: Medicare Other

## 2017-10-23 ENCOUNTER — Other Ambulatory Visit: Payer: Self-pay

## 2017-10-23 DIAGNOSIS — Z7982 Long term (current) use of aspirin: Secondary | ICD-10-CM | POA: Insufficient documentation

## 2017-10-23 DIAGNOSIS — R072 Precordial pain: Secondary | ICD-10-CM

## 2017-10-23 DIAGNOSIS — N189 Chronic kidney disease, unspecified: Secondary | ICD-10-CM | POA: Insufficient documentation

## 2017-10-23 DIAGNOSIS — I129 Hypertensive chronic kidney disease with stage 1 through stage 4 chronic kidney disease, or unspecified chronic kidney disease: Secondary | ICD-10-CM | POA: Diagnosis not present

## 2017-10-23 DIAGNOSIS — I2510 Atherosclerotic heart disease of native coronary artery without angina pectoris: Secondary | ICD-10-CM | POA: Insufficient documentation

## 2017-10-23 DIAGNOSIS — Z87891 Personal history of nicotine dependence: Secondary | ICD-10-CM | POA: Diagnosis not present

## 2017-10-23 DIAGNOSIS — R079 Chest pain, unspecified: Secondary | ICD-10-CM | POA: Diagnosis not present

## 2017-10-23 DIAGNOSIS — Z7901 Long term (current) use of anticoagulants: Secondary | ICD-10-CM | POA: Insufficient documentation

## 2017-10-23 DIAGNOSIS — I25110 Atherosclerotic heart disease of native coronary artery with unstable angina pectoris: Secondary | ICD-10-CM

## 2017-10-23 DIAGNOSIS — Z950 Presence of cardiac pacemaker: Secondary | ICD-10-CM | POA: Insufficient documentation

## 2017-10-23 DIAGNOSIS — Z79899 Other long term (current) drug therapy: Secondary | ICD-10-CM | POA: Diagnosis not present

## 2017-10-23 LAB — BASIC METABOLIC PANEL
Anion gap: 8 (ref 5–15)
Anion gap: 9 (ref 5–15)
BUN: 11 mg/dL (ref 8–23)
BUN: 9 mg/dL (ref 8–23)
CHLORIDE: 105 mmol/L (ref 98–111)
CHLORIDE: 105 mmol/L (ref 98–111)
CO2: 21 mmol/L — AB (ref 22–32)
CO2: 22 mmol/L (ref 22–32)
CREATININE: 0.96 mg/dL (ref 0.61–1.24)
Calcium: 8.4 mg/dL — ABNORMAL LOW (ref 8.9–10.3)
Calcium: 8.8 mg/dL — ABNORMAL LOW (ref 8.9–10.3)
Creatinine, Ser: 1.05 mg/dL (ref 0.61–1.24)
GFR calc Af Amer: >60 mL/min (ref >60)
GFR calc non Af Amer: >60 mL/min (ref >60)
GLUCOSE: 134 mg/dL — AB (ref 70–99)
Glucose, Bld: 106 mg/dL — ABNORMAL HIGH (ref 70–99)
POTASSIUM: 3.4 mmol/L — AB (ref 3.5–5.1)
Potassium: 3.7 mmol/L (ref 3.5–5.1)
Sodium: 134 mmol/L — ABNORMAL LOW (ref 135–145)
Sodium: 136 mmol/L (ref 135–145)

## 2017-10-23 LAB — CBC
HEMATOCRIT: 36.2 % — AB (ref 39.0–52.0)
HEMATOCRIT: 40.4 % (ref 39.0–52.0)
HEMOGLOBIN: 13 g/dL (ref 13.0–17.0)
Hemoglobin: 11.7 g/dL — ABNORMAL LOW (ref 13.0–17.0)
MCH: 31.8 pg (ref 26.0–34.0)
MCH: 31.9 pg (ref 26.0–34.0)
MCHC: 32.3 g/dL (ref 30.0–36.0)
MCHC: 32.2 g/dL (ref 30.0–36.0)
MCV: 98.4 fL (ref 78.0–100.0)
MCV: 99.3 fL (ref 78.0–100.0)
Platelets: 110 10*3/uL — ABNORMAL LOW (ref 150–400)
Platelets: 137 10*3/uL — ABNORMAL LOW (ref 150–400)
RBC: 3.68 MIL/uL — AB (ref 4.22–5.81)
RBC: 4.07 MIL/uL — ABNORMAL LOW (ref 4.22–5.81)
RDW: 14.3 % (ref 11.5–15.5)
RDW: 14.2 % (ref 11.5–15.5)
WBC: 5.1 10*3/uL (ref 4.0–10.5)
WBC: 6.8 10*3/uL (ref 4.0–10.5)

## 2017-10-23 LAB — I-STAT TROPONIN, ED
Troponin i, poc: 0.26 ng/mL (ref 0.00–0.08)
Troponin i, poc: 0.25 ng/mL (ref 0.00–0.08)

## 2017-10-23 MED ORDER — ACETAMINOPHEN 325 MG PO TABS
650.0000 mg | ORAL_TABLET | Freq: Once | ORAL | Status: AC
  Administered 2017-10-23: 650 mg via ORAL
  Filled 2017-10-23: qty 2

## 2017-10-23 MED ORDER — LOSARTAN POTASSIUM 50 MG PO TABS
25.0000 mg | ORAL_TABLET | Freq: Every day | ORAL | Status: DC
  Administered 2017-10-23: 25 mg via ORAL
  Filled 2017-10-23: qty 1

## 2017-10-23 MED ORDER — ISOSORBIDE DINITRATE ER 40 MG PO CPCR
40.0000 mg | ORAL_CAPSULE | Freq: Once | ORAL | Status: DC
  Filled 2017-10-23: qty 1

## 2017-10-23 MED ORDER — CLOPIDOGREL BISULFATE 75 MG PO TABS: 75.0000 mg | ORAL_TABLET | Freq: Every day | ORAL | 11 refills | Status: DC

## 2017-10-23 MED ORDER — NITROGLYCERIN 0.4 MG SL SUBL: 0.4000 mg | SUBLINGUAL_TABLET | SUBLINGUAL | 2 refills | Status: DC | PRN

## 2017-10-23 MED ORDER — LOSARTAN POTASSIUM 25 MG PO TABS: 25.0000 mg | ORAL_TABLET | Freq: Every day | ORAL | 5 refills | Status: DC

## 2017-10-23 MED ORDER — ASPIRIN 81 MG PO TBEC: 81.0000 mg | DELAYED_RELEASE_TABLET | Freq: Every day | ORAL | 0 refills | Status: AC

## 2017-10-23 MED ORDER — METOPROLOL SUCCINATE ER 25 MG PO TB24
12.5000 mg | ORAL_TABLET | Freq: Every day | ORAL | Status: DC
  Administered 2017-10-23: 11:00:00 12.5 mg via ORAL
  Filled 2017-10-23: qty 1

## 2017-10-23 MED ORDER — LOSARTAN POTASSIUM 50 MG PO TABS: 25.0000 mg | ORAL_TABLET | Freq: Every day | ORAL | Status: DC

## 2017-10-23 MED ORDER — METOPROLOL SUCCINATE ER 25 MG PO TB24: 25.0000 mg | ORAL_TABLET | Freq: Every day | ORAL | 5 refills | Status: DC

## 2017-10-23 MED ORDER — ISOSORBIDE DINITRATE 30 MG PO TABS: 30.0000 mg | ORAL_TABLET | Freq: Two times a day (BID) | ORAL | 0 refills | Status: DC

## 2017-10-23 NOTE — ED Notes (Signed)
Up to br tolerated well

## 2017-10-23 NOTE — ED Triage Notes (Signed)
Pt reports onset of upper abdominal/lower chest pain with pain to left shoulder that started after he ate an [employer] sandwich. Pt was discharged today after an additional 2 stents placed (total of 5).

## 2017-10-23 NOTE — ED Notes (Signed)
Dr Ashok Cordia informed of troponin results .25

## 2017-10-23 NOTE — ED Provider Notes (Signed)
[facility] EMERGENCY DEPARTMENT Provider Note   CSN: [id number] Arrival date & time: 10/23/17  1606   History   Chief Complaint Chief Complaint  Patient presents with  . Chest Pain    HPI Nicholas Sanchez is a 84 y.o. male.  HPI 84 year old male with history of CAD status post multiple prior stents including 2 DES's to ostial and mid RCA on 10/22/17, CKD, GERD, HTN, HLD, pHTN who presents to the ED for evaluation of chest pain that occurred again today after being discharged earlier this afternoon.  States that he went to [employer] and had sandwich and [place] fries and shortly after developed burning pain in the center of his chest similar to the presentation that he had just prior to having his stents placed yesterday.  Reports tingling down both arms and pain in his back beneath the left scapula.  That has since improved.  He did have a left scapular pain prior to discharge that improved with Tylenol. No SOA. Took NTG at home and pain had since resolved. Had called the cardiologists office but haven't heard back yet so came to ED for evaluation. Compliant with aspirin, plavix, and rivaroxaban and reports having taken both today. No fevers. No leg swelling.   Past Medical History:  Diagnosis Date  . CAD (coronary artery disease)    a. STEMI 04/2008 s/p BMS to prox & distal RCA, staged DES to LAD same admission. b. Nuc 03/2017: scar but no ischemia, EF 45-54%.  . CKD (chronic kidney disease), stage II   . Cough, persistent   . GERD (gastroesophageal reflux disease)   . History of BPH   . HTN (hypertension)   . Hypercholesteremia   . LV dysfunction    a. EF 45-50% in 01/2016.  . Mild pulmonary hypertension ([place])   . PAF (paroxysmal atrial fibrillation) ([place])   . Presence of permanent cardiac pacemaker   . Sinus node dysfunction ([place])    a. s/p StJude CRT-pacemaker 01/2016.    Patient Active Problem List   Diagnosis Date Noted  . Angina at rest [facility]) 10/20/2017  .  NSTEMI (non-ST elevated myocardial infarction) ([place]) 10/20/2017  . IDA (iron deficiency anemia) 08/04/2017  . Dysphagia 08/04/2017  . Paroxysmal atrial fibrillation ([place]) 01/21/2016  . Heart block 01/08/2016  . CAD (coronary artery disease)   . HTN (hypertension)   . Hypercholesteremia   . GERD (gastroesophageal reflux disease)     Past Surgical History:  Procedure Laterality Date  . CARDIAC CATHETERIZATION    . COLONOSCOPY    . COLONOSCOPY N/A 08/27/2017   Procedure: COLONOSCOPY;  Surgeon: [name], MD;  [facility];  Service: Endoscopy;  Laterality: N/A;  1:00pm  . CORONARY STENT INTERVENTION N/A 10/22/2017   Procedure: CORONARY STENT INTERVENTION;  Surgeon: [name], MD;  Location: [facility];  Service: Cardiovascular;  Laterality: N/A;  . CORONARY STENT PLACEMENT  04/2008   RCA and LAD  . EP IMPLANTABLE DEVICE N/A 01/09/2016   Procedure: BiV Pacemaker Insertion CRT-P;  Surgeon: [name], MD;  Location: [facility];  Service: Cardiovascular;  Laterality: N/A;  . ESOPHAGOGASTRODUODENOSCOPY N/A 08/27/2017   Procedure: ESOPHAGOGASTRODUODENOSCOPY (EGD);  Surgeon: [name], MD;  [facility];  Service: Endoscopy;  Laterality: N/A;  . INSERT / REPLACE / REMOVE PACEMAKER    . MALONEY DILATION N/A 08/27/2017   Procedure: [name] DILATION;  Surgeon: [name], MD;  [facility];  Service: Endoscopy;  Laterality: N/A;  . RIGHT/LEFT HEART CATH AND CORONARY ANGIOGRAPHY N/A 10/21/2017   Procedure: RIGHT/LEFT HEART CATH AND CORONARY ANGIOGRAPHY;  Surgeon: [name], MD;  Location: [facility] LAB;  Service: Cardiovascular;  Laterality: N/A;        Home Medications    Prior to Admission medications   Medication Sig Start Date End Date Taking? Authorizing Provider  nitroGLYCERIN (NITROSTAT) 0.4 MG SL tablet Place 1 tablet (0.4 mg total) under the tongue every 5 (five) minutes x 3 doses as needed for chest  pain. 10/23/17  Yes Lyda Jester M, PA-C  acetaminophen (TYLENOL) 500 MG tablet Take 500 mg by mouth every 6 (six) hours as needed for pain.    [provider]  aspirin EC 81 MG EC tablet Take 1 tablet (81 mg total) by mouth daily for 30 doses. 10/23/17 11/22/17  Consuelo Pandy, PA-C  cholecalciferol (VITAMIN D) 1000 units tablet Take 1,000 Units by mouth 2 (two) times daily.    [provider]  clopidogrel (PLAVIX) 75 MG tablet Take 1 tablet (75 mg total) by mouth daily with breakfast. 10/23/17   Lyda Jester M, PA-C  Dorzolamide HCl-Timolol Mal PF 22.3-6.8 MG/ML SOLN Place 1 drop into the left eye 2 (two) times daily.     [provider]  ferrous sulfate 325 (65 FE) MG tablet Take 325 mg by mouth 3 (three) times daily.    [provider]  hydrochlorothiazide (HYDRODIURIL) 25 MG tablet TAKE ONE (1) TABLET EACH DAY 02/19/17   Herminio Commons, MD  isosorbide dinitrate (ISORDIL) 30 MG tablet Take 1 tablet (30 mg total) by mouth 2 (two) times daily for 14 days. 10/23/17 11/06/17  Corrie Dandy, MD  latanoprost (XALATAN) 0.005 % ophthalmic solution Place 1 drop into both eyes at bedtime.    [provider]  losartan (COZAAR) 25 MG tablet Take 1 tablet (25 mg total) by mouth daily. 10/23/17   Lyda Jester M, PA-C  lovastatin (MEVACOR) 10 MG tablet TAKE ONE (1) TABLET BY MOUTH EVERY DAY 01/05/17   Herminio Commons, MD  metoprolol succinate (TOPROL XL) 25 MG 24 hr tablet Take 1 tablet (25 mg total) by mouth daily. 10/23/17 10/23/18  Lyda Jester M, PA-C  pantoprazole (PROTONIX) 40 MG tablet Take 40 mg by mouth daily.    [provider]  Propylene Glycol (SYSTANE BALANCE OP) Place 1 drop into both eyes 2 (two) times daily as needed (dry eyes).     [provider]  rivaroxaban (XARELTO) 20 MG TABS tablet Take 1 tablet (20 mg total) by mouth daily with supper. Dx: paroxysmal atrial fibrillation 07/30/16   [name],  MD  tamsulosin (FLOMAX) 0.4 MG CAPS capsule Take 0.4 mg by mouth every evening.     [provider]  triamcinolone cream (KENALOG) 0.1 % Apply 1 application topically daily as needed (itching).     [provider]    Family History Family History  Problem Relation Age of Onset  . Emphysema Mother        mother died with emphysema and cancer  . Cancer Mother   . Colon cancer Neg Hx   . Colon polyps Neg Hx     Social History Social History   Tobacco Use  . Smoking status: Former Smoker    Packs/day: 2.50    Years: 25.00    Pack years: 62.50    Types: Cigarettes    Last attempt to quit: 06/14/1973  Years since quitting: 44.3  . Smokeless tobacco: Former Systems developer    Types: Chew    Quit date: 06/14/1973  . Tobacco comment: some/chewed very little for about 2 years  Substance Use Topics  . Alcohol use: No  . Drug use: No     Allergies   Ace inhibitors and Tape   Review of Systems Review of Systems  Constitutional: Negative for chills and fever.  HENT: Negative for congestion.   Respiratory: Negative for cough and shortness of breath.   Cardiovascular: Positive for chest pain. Negative for leg swelling.  Gastrointestinal: Negative for abdominal pain, diarrhea, nausea and vomiting.  Genitourinary: Negative for dysuria and hematuria.  Musculoskeletal: Positive for back pain (beneath left scapula). Negative for neck pain.  Skin: Negative for rash.  Neurological: Negative for weakness, numbness and headaches.  All other systems reviewed and are negative.    Physical Exam Updated Vital Signs BP 138/66   Pulse 70   Temp 97.8 F (36.6 C) (Oral)   Resp (!) 21   Ht 6\' 1"  (1.854 m)   Wt 92.8 kg   SpO2 95%   BMI 26.99 kg/m   Physical Exam  Constitutional: No distress.  HENT:  Head: Normocephalic and atraumatic.  Eyes: Conjunctivae are normal. Right eye exhibits no discharge. Left eye exhibits no discharge.  Neck: Normal range of motion. No tracheal  deviation present.  Cardiovascular: Regular rhythm, normal heart sounds and intact distal pulses.  Pulses:      Radial pulses are 2+ on the right side, and 2+ on the left side.  Symmetric pulses x4 extremities.  Pulmonary/Chest: Effort normal and breath sounds normal. No respiratory distress.  Abdominal: Soft. Bowel sounds are normal. He exhibits no distension. There is no tenderness.  Musculoskeletal: He exhibits no edema or deformity.  Neurological: He is alert. He exhibits normal muscle tone.  Skin: No rash noted. He is not diaphoretic.  Scattered bruises bilateral upper extremity's which he attributes to recent IV placements.   Psychiatric: He has a normal mood and affect.  Nursing note and vitals reviewed.    ED Treatments / Results  Labs (all labs ordered are listed, but only abnormal results are displayed) Labs Reviewed  BASIC METABOLIC PANEL - Abnormal; Notable for the following components:      Result Value   Potassium 3.4 (*)    Glucose, Bld 134 (*)    Calcium 8.8 (*)    All other components within normal limits  CBC - Abnormal; Notable for the following components:   RBC 4.07 (*)    Platelets 137 (*)    All other components within normal limits  I-STAT TROPONIN, ED - Abnormal; Notable for the following components:   Troponin i, poc 0.26 (*)    All other components within normal limits  I-STAT TROPONIN, ED - Abnormal; Notable for the following components:   Troponin i, poc 0.25 (*)    All other components within normal limits    EKG EKG Interpretation  Date/Time:  Saturday October 23 2017 16:13:44 EDT Ventricular Rate:  70 PR Interval:    QRS Duration: 168 QT Interval:  472 QTC Calculation: 509 R Axis:   -70 Text Interpretation:  Electronic ventricular pacemaker Confirmed by Lajean Saver [phone]) on 10/23/2017 5:50:30 PM   Radiology Dg Chest 2 View  Result Date: 10/23/2017 CLINICAL DATA:  Lower chest pain. EXAM: CHEST - 2 VIEW COMPARISON:  October 20, 2017  FINDINGS: Stable pacemaker. Mild cardiomegaly. The hila and mediastinum are normal.  No pneumothorax. No pulmonary nodules or masses. No focal infiltrates. No acute abnormalities are noted. IMPRESSION: No active cardiopulmonary disease. Electronically Signed   By: Dorise Bullion III M.D   On: 10/23/2017 16:55    Procedures Procedures (including critical care time)  Medications Ordered in ED Medications  isosorbide dinitrate (ISOCHRON) CR tablet 40 mg (has no administration in time range)  acetaminophen (TYLENOL) tablet 650 mg (650 mg Oral Given 10/23/17 1829)     Initial Impression / Assessment and Plan / ED Course  I have reviewed the triage vital signs and the nursing notes.  Pertinent labs & imaging results that were available during my care of the patient were reviewed by me and considered in my medical decision making (see chart for details).    84 year old male with history of CAD status post multiple prior stents including 2 DES's to ostial and mid RCA on 10/22/17, CKD, GERD, HTN, HLD, pHTN who presents to the ED for evaluation of chest pain that occurred again today after being discharged earlier this afternoon.    Patient is afebrile, hemodynamically stable at presentation.  Chest pain has resolved prior to my evaluation.  Does have mild reproducible pain in his left upper back with no overlying skin changes.  ECG obtained that shows right approximate 70, ventricularly paced, chronically wide QRS complex. Does have ectopic beats. No major changes when compared to prior. Istat trop 0.26 in setting of recent interventional procedure. Will repeat.   BMP largely unremarkable. Mild hypoK. Does receive potassium repletion at home. CBC is unremarkable. CXR with no acute cardiopulm abnormality. No widening of mediastinum or findings of dissection. Back pain reproducible and chronic many times prior to this. Suspect musculoskeletal. No PTX or effusions. No significant change in cardiac  silhouette.   Spoke with Cardiology fellow on call (Dr. Raiford Simmonds) who evaluated in ED. Recommends discharging on isosorbide dinitrate 30mg  BID and states it is likely post-procedural pain from orbital atherectomy.   Pt monitored in ED. No other painful episodes. Repeat trop with no significant change. Stable for discharge with isosorbide dinitrate as recommended by cardiology. To continue other home meds and f/u with cardiology as previously arranged next week. Return precautions given.  Case and plan of care discussed with Dr. Ashok Cordia .  Final Clinical Impressions(s) / ED Diagnoses   Final diagnoses:  Precordial pain    ED Discharge Orders         Ordered    isosorbide dinitrate (ISORDIL) 30 MG tablet  2 times daily     10/23/17 2029          Corrie Dandy, MD 10/23/17 2030  Lajean Saver, MD 10/23/17 2325

## 2017-10-23 NOTE — Telephone Encounter (Signed)
Patient's daughter stated that he had developed chest pain.  She was angry that I had not called back sooner and stated they had already taken him to the emergency room.  I advised that was the best thing for him.  Rosaria Ferries, PA-C 10/23/2017 4:27 PM [facility] [phone]

## 2017-10-23 NOTE — Consult Note (Signed)
Cardiology Consult    Patient ID: Nicholas Sanchez MRN: [id number], DOB/AGE: [birth year]   Admit date: 10/23/2017 Date of Consult: 10/23/2017  Primary Physician: Sinda Du, MD Primary Cardiologist: Kate Sable, MD Requesting Provider: Glory Rosebush  Patient Profile    Nicholas Sanchez is a 84 y.o. male with a history of CAD, cardiomyopathy, pulmonary hypertension.  He presents after being discharged this morning following an end STEMI with stent placement to an in-stent stenosis in the RCA.  Following the procedure he did not complain about any chest pain.  He was released late in the morning and had lunch.  Following lunch he noted to have new onset chest pain across the chest radiating to his arm which was similar to the prior episode that necessitated stent.  He thinks the symptoms were not as bad though.  He felt weak and and called the office.  Since he did not hear back he decided to go back to the ER.  He did get a call back once in the ER but at that time point he decided not to take the call anymore.  He took one nitroglycerin which was resolved the pain.  In ED is hemodynamic is stable.  Pain-free.  ECG with paced rhythm.   Past Medical History   Past Medical History:  Diagnosis Date  . CAD (coronary artery disease)    a. STEMI 04/2008 s/p BMS to prox & distal RCA, staged DES to LAD same admission. b. Nuc 03/2017: scar but no ischemia, EF 45-54%.  . CKD (chronic kidney disease), stage II   . Cough, persistent   . GERD (gastroesophageal reflux disease)   . History of BPH   . HTN (hypertension)   . Hypercholesteremia   . LV dysfunction    a. EF 45-50% in 01/2016.  . Mild pulmonary hypertension ([place])   . PAF (paroxysmal atrial fibrillation) ([place])   . Presence of permanent cardiac pacemaker   . Sinus node dysfunction ([place])    a. s/p StJude CRT-pacemaker 01/2016.    Past Surgical History:  Procedure Laterality Date  . CARDIAC CATHETERIZATION    . COLONOSCOPY    .  COLONOSCOPY N/A 08/27/2017   Procedure: COLONOSCOPY;  Surgeon: [name], MD;  [facility];  Service: Endoscopy;  Laterality: N/A;  1:00pm  . CORONARY STENT INTERVENTION N/A 10/22/2017   Procedure: CORONARY STENT INTERVENTION;  Surgeon: [name], MD;  Location: [facility] LAB;  Service: Cardiovascular;  Laterality: N/A;  . CORONARY STENT PLACEMENT  04/2008   RCA and LAD  . EP IMPLANTABLE DEVICE N/A 01/09/2016   Procedure: BiV Pacemaker Insertion CRT-P;  Surgeon: [name], MD;  Location: [facility] LAB;  Service: Cardiovascular;  Laterality: N/A;  . ESOPHAGOGASTRODUODENOSCOPY N/A 08/27/2017   Procedure: ESOPHAGOGASTRODUODENOSCOPY (EGD);  Surgeon: [name], MD;  [facility];  Service: Endoscopy;  Laterality: N/A;  . INSERT / REPLACE / REMOVE PACEMAKER    . MALONEY DILATION N/A 08/27/2017   Procedure: [name] DILATION;  Surgeon: [name], MD;  [facility];  Service: Endoscopy;  Laterality: N/A;  . RIGHT/LEFT HEART CATH AND CORONARY ANGIOGRAPHY N/A 10/21/2017   Procedure: RIGHT/LEFT HEART CATH AND CORONARY ANGIOGRAPHY;  Surgeon: [name], MD;  Location: [facility];  Service: Cardiovascular;  Laterality: N/A;     Allergies  Allergies  Allergen Reactions  . Ace Inhibitors Nausea Only  . Tape Rash    And Bandaids, too  Inpatient Medications    None  Family History    Family History  Problem Relation Age of Onset  . Emphysema Mother        mother died with emphysema and cancer  . Cancer Mother   . Colon cancer Neg Hx   . Colon polyps Neg Hx    He indicated that his mother is deceased. He indicated that his father is deceased. He indicated that his maternal grandmother is deceased. He indicated that his maternal grandfather is deceased. He indicated that his paternal grandmother is deceased. He indicated that his paternal grandfather is deceased. He indicated that the status of his neg hx is  unknown.   Social History    Social History   Socioeconomic History  . Marital status: Married    Spouse name: Not on file  . Number of children: Not on file  . Years of education: Not on file  . Highest education level: Not on file  Occupational History  . Occupation: retired    Comment: Geographical information systems officer  . Financial resource strain: Not on file  . Food insecurity:    Worry: Not on file    Inability: Not on file  . Transportation needs:    Medical: Not on file    Non-medical: Not on file  Tobacco Use  . Smoking status: Former Smoker    Packs/day: 2.50    Years: 25.00    Pack years: 62.50    Types: Cigarettes    Last attempt to quit: 06/14/1973    Years since quitting: 44.3  . Smokeless tobacco: Former Systems developer    Types: Chew    Quit date: 06/14/1973  . Tobacco comment: some/chewed very little for about 2 years  Substance and Sexual Activity  . Alcohol use: No  . Drug use: No  . Sexual activity: Not on file  Lifestyle  . Physical activity:    Days per week: Not on file    Minutes per session: Not on file  . Stress: Not on file  Relationships  . Social connections:    Talks on phone: Not on file    Gets together: Not on file    Attends religious service: Not on file    Active member of club or organization: Not on file    Attends meetings of clubs or organizations: Not on file    Relationship status: Not on file  . Intimate partner violence:    Fear of current or ex partner: Not on file    Emotionally abused: Not on file    Physically abused: Not on file    Forced sexual activity: Not on file  Other Topics Concern  . 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      Review of Systems    General:  No chills, fever, night sweats or weight changes.  Cardiovascular:  No chest pain, dyspnea on exertion, edema, orthopnea, palpitations, paroxysmal nocturnal dyspnea. Dermatological: No rash, lesions/masses Respiratory:  No cough, dyspnea Urologic: No hematuria, dysuria Abdominal:   No nausea, vomiting, diarrhea, bright red blood per rectum, melena, or hematemesis Neurologic:  No visual changes, wkns, changes in mental status. All other systems reviewed and are otherwise negative except as noted above.  Physical Exam    Blood pressure 140/65, pulse 71, temperature 97.8 F (36.6 C), temperature source Oral, resp. rate 18, height 6\' 1"  (1.854 m), weight 92.8 kg, SpO2 95 %.  General: Pleasant,  NAD Psych: Normal affect. Neuro: Alert and oriented X 3. Moves all extremities spontaneously. HEENT: Normal  Neck: Supple without bruits or JVD. Lungs:  Resp regular and unlabored, CTA. Heart: RRR no s3, s4, or murmurs. Abdomen: Soft, non-tender, non-distended, BS + x 4.  Extremities: No clubbing, cyanosis or edema. DP/PT/Radials 2+ and equal bilaterally.  Labs    Troponin [facility] of Care Test) Recent Labs    10/23/17 1642  TROPIPOC 0.26*   No results for input(s): CKTOTAL, CKMB, TROPONINI in the last 72 hours. Lab Results  Component Value Date   WBC 6.8 10/23/2017   HGB 13.0 10/23/2017   HCT 40.4 10/23/2017   MCV 99.3 10/23/2017   PLT 137 (L) 10/23/2017    Recent Labs  Lab 10/20/17 0920  10/23/17 1621  NA  --    < > 136  K  --    < > 3.4*  CL  --    < > 105  CO2  --    < > 22  BUN  --    < > 9  CREATININE  --    < > 1.05  CALCIUM  --    < > 8.8*  PROT 7.4  --   --   BILITOT 1.0  --   --   ALKPHOS 36*  --   --   ALT 13  --   --   AST 19  --   --   GLUCOSE  --    < > 134*   < > = values in this interval not displayed.   Lab Results  Component Value Date   CHOL 104 03/01/2017   HDL 33 (L) 03/01/2017   LDLCALC 58 03/01/2017   TRIG 54 03/01/2017   No results found for: [facility]   Radiology Studies    Dg Chest 2 View  Result Date: 10/23/2017 CLINICAL DATA:  Lower chest pain. EXAM: CHEST - 2 VIEW COMPARISON:  October 20, 2017 FINDINGS: Stable pacemaker. Mild cardiomegaly. The hila and  mediastinum are normal. No pneumothorax. No pulmonary nodules or masses. No focal infiltrates. No acute abnormalities are noted. IMPRESSION: No active cardiopulmonary disease. Electronically Signed   By: Dorise Bullion III M.D   On: 10/23/2017 16:55   Dg Chest 2 View  Result Date: 10/20/2017 CLINICAL DATA:  Chest pain, onset this morning. EXAM: CHEST - 2 VIEW COMPARISON:  Radiograph 07/22/2017, 06/29/2017 FINDINGS: Left-sided pacemaker in place. Unchanged cardiomegaly mediastinal contours. Aortic atherosclerosis. Chronic interstitial opacities with fine reticulation on the left, unchanged from prior exams. No focal airspace disease, pleural effusion or pneumothorax. No acute osseous abnormalities. IMPRESSION: No acute findings. Chronic interstitial opacities are unchanged from prior exam, suggesting interstitial lung disease. Electronically Signed   By: Jeb Levering M.D.   On: 10/20/2017 07:01    ECG & Cardiac Imaging    Ventricular paced rhythm  Assessment & Plan    CAD: Recent stent placed to RCA.  Was a complicated procedure requiring orbital atherectomy.  His pain is likely procedure related but his do not think this is related to dental occlusion.  His troponin elevation is expected in the setting post PCI.  Troponin is only mildly elevated from the pre-procedure troponin.  Since he is pain-free respond well to nitrates I suspect the will be able to manage postprocedural pain with nitroglycerin.  I would recommend to start isosorbide dinitrate 30 mg twice a day.  I advised him to take all his other prescribed medications.  Follow-up with  cardiology in 1 week recommended.  Signed, Cristina Gong, MD 10/23/2017, 6:42 PM

## 2017-10-23 NOTE — ED Notes (Signed)
Cards at the bedside

## 2017-10-23 NOTE — ED Notes (Signed)
Cards neg to get iv by iv team

## 2017-10-23 NOTE — ED Notes (Signed)
Dr.Steinl made aware of pt  istat troponin. ED-LAb

## 2017-10-23 NOTE — Progress Notes (Signed)
CARDIAC REHAB PHASE I   PRE:  Rate/Rhythm: 71 AV paced   BP:  Supine:   Sitting: 182/76  Standing:    SaO2: 96% ra   MODE:  Ambulation: 350  ft   POST:  Rate/Rhythem: 72 AV paced   BP:  Supine:   Sitting: 195/87  Standing:    SaO2: 94% ra   0900 -1004  Pt ambulated in hallway x1 assist.  Unsteady gait at times, pt encouraged to use rolling walker at home for safety.    Education completed including risk factor modification, low fat-low cholesterol diet, exercise, and medication compliance.  Pt oriented to outpatient cardiac rehab.  At pt request, referral will be sent to [facility].  Pt is not interested in low fat diet, however states he is willing to try. Discussed moderation of fried foods.  Understanding verbalized Nicholas Sanchez

## 2017-10-23 NOTE — ED Notes (Signed)
The pt has no chest pain at present.  After he was dicd this afternoon he requested a beef and cheddar  And curley fries from [employer] approx 1 hour after he ate he started having chest pain.  He took a sl nitro when he left the house  Whenever he arrived in the treatment room here his pain stopped

## 2017-10-23 NOTE — Discharge Summary (Addendum)
Discharge Summary    Patient ID: Nicholas Sanchez,  MRN: [id number], DOB/AGE: 1933-04-17 84 y.o.  Admit date: 10/20/2017 Discharge date: 10/23/2017  Primary Care Provider: Sinda Sanchez Primary Cardiologist: Nicholas Sable, MD  EP: Nicholas Sanchez  Discharge Diagnoses    Principal Problem:   CAD (coronary artery disease) Active Problems:   HTN (hypertension)   Hypercholesteremia   GERD (gastroesophageal reflux disease)   Paroxysmal atrial fibrillation (HCC)   Angina at rest [facility])   NSTEMI (non-ST elevated myocardial infarction) ([place])   Allergies Allergies  Allergen Reactions  . Ace Inhibitors Nausea Only  . Tape Rash    And Bandaids, too    Diagnostic Studies/Procedures    Diagnostic LHC 10/21/17 Procedures   RIGHT/LEFT HEART CATH AND CORONARY ANGIOGRAPHY  Conclusion    Diffuse in-stent restenosis within the ostial to proximal RCA stent.  High-grade mid RCA stenosis within a region of tortuosity.  Distal RCA is large.  Normal left main.  Large widely patent LAD including proximal stent.  Large normal ramus  Relatively small normal circumflex.  LVEF estimated 30 to 35%.  LVEDP 17 mmHg  RECOMMENDATIONS:   Given reduced LV function, anterior origin of the right coronary from the right sinus of Valsalva, diffuse calcified in-stent restenosis in the ostial to proximal RCA, and high-grade potentially calcified stenosis in the mid vessel within angulated segment, PCI will be high risk.  Recommend femoral approach.  Discussed with Nicholas Sanchez.  Loaded with Plavix.  Patient is on chronic anticoagulation therapy.   _____________________________  CORONARY STENT INTERVENTION 10/22/17  Conclusion   Successful orbital atherectomy, noncompliant balloon angioplasty, Cutting Balloon angioplasty, and stenting of severe stenosis in the mid RCA and severe in-stent restenosis in the proximal and ostial RCA using 2 DES platforms (3.5x15 mm Orsiro in the mid vessel and  3.5x18 mm Orsiro in the proximal vessel)  Recommend to resume Rivaroxaban, at currently prescribed dose and frequency, on 10/23/2017.  Recommend concurrent antiplatelet therapy of Aspirin 81mg  daily for 1 month and Clopidogrel 75mg  daily for 6 months.     History of Present Illness     Nicholas Sanchez 84 yo male history of CAD (STEMI 04/2008 s/p BMS to prox & distal RCA, staged DES to LAD same admission), LV dysfunction (EF 45-50% in 01/2016), mild pulm HTN, sinus node dysfunction and high grade AV block s/p St. Jude BiV-PPM 01/2016, PAF, CKD II, GERD, BPH, HTN, HLD, ACE-I allergy and anemia with fairly benign upper and lower endoscopy 07/2017 who was initially admitted to [facility] with chest pain c/w unstable angina. He ruled in for NSTEMI w/ troponin peaking at 0.15. He was subsequently started on IV heparin and transferred to [facility] for [facility] and further management of NSTEMI.   Hospital Course     On 10/21/17, pt underwent diagnostic LHC by Nicholas Sanchez via the right radial artery which showed diffuse in-stent restenosis within the ostial to proximal RCA stent.  High-grade mid RCA stenosis within a region of tortuosity. Normal LM and widely patent LAD including proximal stent. Large normal ramus and relatively small, but normal, circumflex. LVEF was 30-35%. Given reduced LV function, anterior origin of the right coronary from the right sinus of Valsalva, diffuse calcified in-stent restenosis in the ostial to proximal RCA, and high-grade potentially calcified stenosis in the mid vessel within angulated segment, PCI was felt to be high risk. He was loaded with Plavix with plans to do staged intervention the following day, via femoral approach. He returned  to the cath lab on 10/22/17 for planned intervention, this time performed by Nicholas Sanchez. He underwent successful orbital atherectomy, noncompliant balloon angioplasty, Cutting Balloon angioplasty, and stenting of severe stenosis in the mid RCA and severe in-stent  restenosis in the proximal and ostial RCA using 2 DES platforms (3.5x15 mm Orsiro in the mid vessel and 3.5x18 mm Orsiro in the proximal vessel). He tolerated the procedure well and left the cath lab in stable condition. Nicholas Sanchez recommended resuming Xarelto and concurrent antiplatelet therapy of Aspirin 81mg  daily for 1 month and Clopidogrel 75mg  daily for 6 months. He was also treated with a PPI for GI protection. Additional cardiac meds included metoprolol, losartan and pravastatin.   Pt was monitored post PCI and had no post cath complications. He denied any recurrent CP. Vital signs remained stable as did renal function. There was a slight drop in H/H during hospitalization and following both caths, Hgb 13.5>>12.6>>11.7. Right radial and femoral cath sits stable. He will need a repeat CBC at time of clinic f/u.   On 10/23/17, he was last seen and examined by Nicholas Sanchez who determined he was stable for discharge home. Post hospital f/u will be arranged with Nicholas Sanchez in [place].   Consultants: none    Discharge Vitals Blood pressure (!) 192/89, pulse 64, temperature (!) 97.2 F (36.2 C), temperature source Oral, resp. rate (!) 30, height 6\' 1"  (1.854 m), weight 92.8 kg, SpO2 97 %.  Filed Weights   10/20/17 0613 10/21/17 0447 10/23/17 0500  Weight: 90.7 kg 91.3 kg 92.8 kg   Physical Exam  Constitutional: He is oriented to person, place, and time. He appears well-developed and well-nourished. No distress.  Elderly white male   HENT:  Head: Normocephalic and atraumatic.  Eyes: Pupils are equal, round, and reactive to light. Conjunctivae are normal.  Neck: Normal range of motion. Neck supple. No JVD present. No tracheal deviation present.  Cardiovascular: Normal rate, regular rhythm and normal heart sounds.  No murmur heard. Pulmonary/Chest: Effort normal and breath sounds normal. No respiratory distress. He has no wheezes. He has no rales.  Abdominal: Soft. Bowel sounds are  normal. He exhibits no distension. There is no tenderness.  Musculoskeletal: Normal range of motion. He exhibits no edema, tenderness or deformity.  Right groin soft and nontender, no ecchymosis,   Neurological: He is alert and oriented to person, place, and time.  Skin: Skin is warm and dry. No rash noted. No erythema.  Psychiatric: He has a normal mood and affect. His behavior is normal.    Labs & Radiologic Studies    CBC Recent Labs    10/22/17 0457 10/23/17 0221  WBC 5.7 5.1  HGB 12.6* 11.7*  HCT 38.4* 36.2*  MCV 97.2 98.4  PLT 132* 993*   Basic Metabolic Panel Recent Labs    10/22/17 0457 10/23/17 0221  NA 134* 134*  K 3.8 3.7  CL 104 105  CO2 22 21*  GLUCOSE 86 106*  BUN 11 11  CREATININE 0.99 0.96  CALCIUM 8.5* 8.4*   Liver Function Tests No results for input(s): AST, ALT, ALKPHOS, BILITOT, PROT, ALBUMIN in the last 72 hours. No results for input(s): LIPASE, AMYLASE in the last 72 hours. Cardiac Enzymes Recent Labs    10/20/17 1056 10/20/17 1345 10/20/17 1708  TROPONINI 0.15* 0.13* 0.11*   BNP Invalid input(s): POCBNP D-Dimer No results for input(s): DDIMER in the last 72 hours. Hemoglobin A1C No results for input(s): HGBA1C in the last 72 hours.  Fasting Lipid Panel No results for input(s): CHOL, HDL, LDLCALC, TRIG, CHOLHDL, LDLDIRECT in the last 72 hours. Thyroid Function Tests No results for input(s): TSH, T4TOTAL, T3FREE, THYROIDAB in the last 72 hours.  Invalid input(s): FREET3 _____________  Dg Chest 2 View  Result Date: 10/20/2017 CLINICAL DATA:  Chest pain, onset this morning. EXAM: CHEST - 2 VIEW COMPARISON:  Radiograph 07/22/2017, 06/29/2017 FINDINGS: Left-sided pacemaker in place. Unchanged cardiomegaly mediastinal contours. Aortic atherosclerosis. Chronic interstitial opacities with fine reticulation on the left, unchanged from prior exams. No focal airspace disease, pleural effusion or pneumothorax. No acute osseous abnormalities.  IMPRESSION: No acute findings. Chronic interstitial opacities are unchanged from prior exam, suggesting interstitial lung disease. Electronically Signed   By: Jeb Levering M.D.   On: 10/20/2017 07:01   Disposition   Pt is being discharged home today in good condition.  Follow-up Plans & Appointments    Follow-up Information    Herminio Commons, MD Follow up.   Specialty:  Cardiology Why:  our office will call you with a hospital follow-up visit Contact information: [place] [address] [phone]          Discharge Instructions    Diet - low sodium heart healthy   Complete by:  As directed    Increase activity slowly   Complete by:  As directed       Discharge Medications   Allergies as of 10/23/2017      Reactions   Ace Inhibitors Nausea Only   Tape Rash   And Bandaids, too      Medication List    TAKE these medications   acetaminophen 500 MG tablet Commonly known as:  TYLENOL Take 500 mg by mouth every 6 (six) hours as needed for pain.   aspirin 81 MG EC tablet Take 1 tablet (81 mg total) by mouth daily for 30 doses.   cholecalciferol 1000 units tablet Commonly known as:  VITAMIN D Take 1,000 Units by mouth 2 (two) times daily.   clopidogrel 75 MG tablet Commonly known as:  PLAVIX Take 1 tablet (75 mg total) by mouth daily with breakfast.   dorzolamidel-timolol 22.3-6.8 MG/ML Soln ophthalmic solution Commonly known as:  COSOPT Place 1 drop into the left eye 2 (two) times daily.   ferrous sulfate 325 (65 FE) MG tablet Take 325 mg by mouth 3 (three) times daily.   hydrochlorothiazide 25 MG tablet Commonly known as:  HYDRODIURIL TAKE ONE (1) TABLET EACH DAY   latanoprost 0.005 % ophthalmic solution Commonly known as:  XALATAN Place 1 drop into both eyes at bedtime.   losartan 25 MG tablet Commonly known as:  COZAAR Take 1 tablet (25 mg total) by mouth daily.   lovastatin 10 MG tablet Commonly known as:  MEVACOR TAKE  ONE (1) TABLET BY MOUTH EVERY DAY   metoprolol succinate 25 MG 24 hr tablet Commonly known as:  TOPROL-XL Take 1 tablet (25 mg total) by mouth daily.   nitroGLYCERIN 0.4 MG SL tablet Commonly known as:  NITROSTAT Place 1 tablet (0.4 mg total) under the tongue every 5 (five) minutes x 3 doses as needed for chest pain.   pantoprazole 40 MG tablet Commonly known as:  PROTONIX Take 40 mg by mouth daily.   rivaroxaban 20 MG Tabs tablet Commonly known as:  XARELTO Take 1 tablet (20 mg total) by mouth daily with supper. Dx: paroxysmal atrial fibrillation   SYSTANE BALANCE OP Place 1 drop into both eyes 2 (two) times  daily as needed (dry eyes).   tamsulosin 0.4 MG Caps capsule Commonly known as:  FLOMAX Take 0.4 mg by mouth every evening.   triamcinolone cream 0.1 % Commonly known as:  KENALOG Apply 1 application topically daily as needed (itching).        Acute coronary syndrome (MI, NSTEMI, STEMI, etc) this admission?: Yes.     AHA/ACC Clinical Performance & Quality Measures: 1. Aspirin prescribed? - Yes 2. ADP Receptor Inhibitor (Plavix/Clopidogrel, Brilinta/Ticagrelor or Effient/Prasugrel) prescribed (includes medically managed patients)? - Yes 3. Beta Blocker prescribed? - Yes 4. High Intensity Statin (Lipitor 40-80mg  or Crestor 20-40mg ) prescribed? - Yes 5. EF assessed during THIS hospitalization? - Yes 6. For EF <40%, was ACEI/ARB prescribed? - Yes 7. For EF <40%, Aldosterone Antagonist (Spironolactone or Eplerenone) prescribed? - No - Reason:  No BP room (consider addition as OP) 8. Cardiac Rehab Phase II ordered (Included Medically managed Patients)? - Yes     Outstanding Labs/Studies   Repeat CBC and BMP in 7 days.   Duration of Discharge Encounter   Greater than 30 minutes including physician time.  Signed, Lyda Jester, PA-C 10/23/2017, 9:33 AM  Patient seen and discussed with PA Rosita Fire, I agree with her documentation above. Admitted with NSTEMI.  Received PCI to RCA with DES x 2. Recs for triple therapy for 1 month with xarelto, ASA, plavix and then stop aspirin. Post cath labs are stbale. Medical therapy with ASA, plavix 75, lopressor 12.5mg  bid. prava 10, xarelto 20. Due to low LVEF change lopressor to Toprol 25 mg daily. He has ACE-I allergy with nausea, unclear if has been tried on ARB from chart review. Elevated bp's this AM, will start losartan 25mg  daily.    Carlyle Dolly MD

## 2017-11-09 ENCOUNTER — Ambulatory Visit (INDEPENDENT_AMBULATORY_CARE_PROVIDER_SITE_OTHER): Payer: Medicare Other | Admitting: Gastroenterology

## 2017-11-09 ENCOUNTER — Encounter: Payer: Self-pay | Admitting: Gastroenterology

## 2017-11-09 VITALS — BP 137/76 | HR 79 | Temp 97.3°F | Ht 73.0 in | Wt 203.4 lb

## 2017-11-09 DIAGNOSIS — Z86010 Personal history of colonic polyps: Secondary | ICD-10-CM | POA: Diagnosis not present

## 2017-11-09 DIAGNOSIS — D508 Other iron deficiency anemias: Secondary | ICD-10-CM

## 2017-11-09 NOTE — Patient Instructions (Signed)
We will be in touch on when your next colonoscopy will be, but I anticipate it will be in the next year.   I am glad you are doing well otherwise!  I enjoyed seeing you again today! As you know, I value our relationship and want to provide genuine, compassionate, and quality care. I welcome your feedback. If you receive a survey regarding your visit,  I greatly appreciate you taking time to fill this out. See you next time!  Annitta Needs, PhD, ANP-BC [facility]

## 2017-11-09 NOTE — Progress Notes (Signed)
Referring Provider: Sinda Du, MD Primary Care Physician:  Sinda Du, MD  Primary GI: Dr. [name]   Chief Complaint  Patient presents with  . Anemia    f/u    HPI:   Nicholas Sanchez is an 84 y.o. male presenting today with a history of IDA in setting of Xarelto, s/p EGD and colonoscopy. EGD with normal esophagus s/p dilation, gastric petechia, normal duodenum. Colonoscopy with 25 five to 25 mm polyps in descending colon, ascending, cecum. 2X3 cm carpet polyp in base of cecum lifted away s/p piecemeal polypectomy. Tubulovillous adenomas and tubular adenomas. 6 month surveillance recommended.    In interim from colonoscopy, he was admitted 8/21 to 8/24 with NSTEMI, repeat cardiac cath with in-stent restenosis within the ostial to proximal RCA and high grade stenosis alon gthe mid RCA. Orbital atherectomy and balloon angioplasty with stenting of proximal RCA and mid RCA with 2 DES. Restarted on Xarelto following procedure with recommendations to continue aspirin, Plavix, and Xarelto for one month then stop aspirin and continue on Plavix along with the Xarelto for 6 months.   No overt GI bleeding. No abdominal pain, N/V. Doing well from GI standpoint.    Past Medical History:  Diagnosis Date  . CAD (coronary artery disease)    a. STEMI 04/2008 s/p BMS to prox & distal RCA, staged DES to LAD same admission. b. Nuc 03/2017: scar but no ischemia, EF 45-54%.  . CKD (chronic kidney disease), stage II   . Cough, persistent   . GERD (gastroesophageal reflux disease)   . History of BPH   . HTN (hypertension)   . Hypercholesteremia   . LV dysfunction    a. EF 45-50% in 01/2016.  . Mild pulmonary hypertension ([place])   . PAF (paroxysmal atrial fibrillation) ([place])   . Presence of permanent cardiac pacemaker   . Sinus node dysfunction ([place])    a. s/p StJude CRT-pacemaker 01/2016.    Past Surgical History:  Procedure Laterality Date  . CARDIAC CATHETERIZATION    . COLONOSCOPY     . COLONOSCOPY N/A 08/27/2017   Procedure: COLONOSCOPY;  Surgeon: [name], MD;  [facility];  Service: Endoscopy;  Laterality: N/A;  1:00pm  . CORONARY STENT INTERVENTION N/A 10/22/2017   Procedure: CORONARY STENT INTERVENTION;  Surgeon: [name], MD;  Location: [place] [facility];  Service: Cardiovascular;  Laterality: N/A;  . CORONARY STENT PLACEMENT  04/2008   RCA and LAD  . EP IMPLANTABLE DEVICE N/A 01/09/2016   Procedure: BiV Pacemaker Insertion CRT-P;  Surgeon: [name], MD;  Location: [place] [facility];  Service: Cardiovascular;  Laterality: N/A;  . ESOPHAGOGASTRODUODENOSCOPY N/A 08/27/2017   Procedure: ESOPHAGOGASTRODUODENOSCOPY (EGD);  Surgeon: [name], MD;  [facility]: [facility];  Service: Endoscopy;  Laterality: N/A;  . INSERT / REPLACE / REMOVE PACEMAKER    . MALONEY DILATION N/A 08/27/2017   Procedure: [name] DILATION;  Surgeon: [name], MD;  [facility]: [facility];  Service: Endoscopy;  Laterality: N/A;  . RIGHT/LEFT HEART CATH AND CORONARY ANGIOGRAPHY N/A 10/21/2017   Procedure: RIGHT/LEFT HEART CATH AND CORONARY ANGIOGRAPHY;  Surgeon: [name], MD;  Location: [place] [facility];  Service: Cardiovascular;  Laterality: N/A;    Current Outpatient Medications  Medication Sig Dispense Refill  . acetaminophen (TYLENOL) 500 MG tablet Take 500 mg by mouth every 6 (six) hours as needed for pain.    [name] aspirin EC 81 MG EC  tablet Take 1 tablet (81 mg total) by mouth daily for 30 doses. 30 tablet 0  . cholecalciferol (VITAMIN D) 1000 units tablet Take 1,000 Units by mouth 2 (two) times daily.    . clopidogrel (PLAVIX) 75 MG tablet Take 1 tablet (75 mg total) by mouth daily with breakfast. 30 tablet 11  . ferrous sulfate 325 (65 FE) MG tablet Take 325 mg by mouth 3 (three) times daily.    . hydrochlorothiazide (HYDRODIURIL) 25 MG tablet TAKE ONE (1) TABLET EACH DAY 90 tablet 3  . latanoprost (XALATAN) 0.005 % ophthalmic solution  Place 1 drop into both eyes at bedtime.    [name] losartan (COZAAR) 25 MG tablet Take 1 tablet (25 mg total) by mouth daily. 30 tablet 5  . lovastatin (MEVACOR) 10 MG tablet TAKE ONE (1) TABLET BY MOUTH EVERY DAY 30 tablet 11  . metoprolol succinate (TOPROL XL) 25 MG 24 hr tablet Take 1 tablet (25 mg total) by mouth daily. 30 tablet 5  . nitroGLYCERIN (NITROSTAT) 0.4 MG SL tablet Place 1 tablet (0.4 mg total) under the tongue every 5 (five) minutes x 3 doses as needed for chest pain. 25 tablet 2  . pantoprazole (PROTONIX) 40 MG tablet Take 40 mg by mouth daily.    [name] Propylene Glycol (SYSTANE BALANCE OP) Place 1 drop into both eyes 2 (two) times daily as needed (dry eyes).     . rivaroxaban (XARELTO) 20 MG TABS tablet Take 1 tablet (20 mg total) by mouth daily with supper. Dx: paroxysmal atrial fibrillation 90 tablet 3  . tamsulosin (FLOMAX) 0.4 MG CAPS capsule Take 0.4 mg by mouth every evening.     . triamcinolone cream (KENALOG) 0.1 % Apply 1 application topically daily as needed (itching).     . vitamin B-12 (CYANOCOBALAMIN) 1000 MCG tablet Take 1,000 mcg by mouth daily.    . isosorbide dinitrate (ISORDIL) 30 MG tablet Take 1 tablet (30 mg total) by mouth 2 (two) times daily for 14 days. 28 tablet 0   No current facility-administered medications for this visit.     Allergies as of 11/09/2017 - Review Complete 11/09/2017  Allergen Reaction Noted  . Ace inhibitors Nausea Only 07/18/2010  . Tape Rash 01/08/2016    Family History  Problem Relation Age of Onset  . Emphysema Mother        mother died with emphysema and cancer  . Cancer Mother   . Colon cancer Neg Hx   . Colon polyps Neg Hx     Social History   Socioeconomic History  . Marital status: Married    Spouse name: Not on file  . Number of children: Not on file  . Years of education: Not on file  . Highest education level: Not on file  Occupational History  . Occupation: retired    Comment: Geographical information systems officer  . Financial  resource strain: Not on file  . Food insecurity:    Worry: Not on file    Inability: Not on file  . Transportation needs:    Medical: Not on file    Non-medical: Not on file  Tobacco Use  . Smoking status: Former Smoker    Packs/day: 2.50    Years: 25.00    Pack years: 62.50    Types: Cigarettes    Last attempt to quit: 06/14/1973    Years since quitting: 44.4  . Smokeless tobacco: Former Systems developer    Types: Chew    Quit date: 06/14/1973  .  Tobacco comment: some/chewed very little for about 2 years  Substance and Sexual Activity  . Alcohol use: No  . Drug use: No  . Sexual activity: Not on file  Lifestyle  . Physical activity:    Days per week: Not on file    Minutes per session: Not on file  . Stress: Not on file  Relationships  . Social connections:    Talks on phone: Not on file    Gets together: Not on file    Attends religious service: Not on file    Active member of club or organization: Not on file    Attends meetings of clubs or organizations: Not on file    Relationship status: Not on file  Other Topics Concern  . 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     Review of Systems: Gen: Denies fever, chills, anorexia. Denies fatigue, weakness, weight loss.  CV: Denies chest pain, palpitations, syncope, peripheral edema, and claudication. Resp: Denies dyspnea at rest, cough, wheezing, coughing up blood, and pleurisy. GI: see HPI  Derm: Denies rash, itching, dry skin Psych: Denies depression, anxiety, memory loss, confusion. No homicidal or suicidal ideation.  Heme: Denies bruising, bleeding, and enlarged lymph nodes.  Physical Exam: BP 137/76   Pulse 79   Temp (!) 97.3 F (36.3 C) (Oral)   Ht 6\' 1"  (1.854 m)   Wt 203 lb 6.4 oz (92.3 kg)   BMI 26.84 kg/m  General:   Alert and oriented. No distress noted. Pleasant and cooperative.  Head:  Normocephalic and atraumatic. Eyes:  Conjuctiva clear without scleral  icterus. Mouth:  Oral mucosa pink and moist. Good dentition. No lesions. Abdomen:  +BS, soft, non-tender and non-distended. No rebound or guarding. No HSM or masses noted. Msk:  Symmetrical without gross deformities. Normal posture. Extremities:  Without edema. Neurologic:  Alert and  oriented x4 Psych:  Alert and cooperative. Normal mood and affect.   Lab Results  Component Value Date   WBC 5.9 11/12/2017   HGB 13.0 11/12/2017   HCT 38.6 (L) 11/12/2017   MCV 96.7 11/12/2017   PLT 137 (L) 11/12/2017

## 2017-11-11 NOTE — Progress Notes (Signed)
Cardiology Office Note    Date:  11/12/2017   ID:  Nicholas Sanchez, DOB 02/25/1934, MRN [id number]  PCP:  Sinda Du, MD  Cardiologist: Kate Sable, MD   EP: Dr. Lovena Le  Chief Complaint  Patient presents with  . Hospitalization Follow-up    History of Present Illness:    Nicholas Sanchez is a 84 y.o. male with past medical history of CAD (s/p STEMI in 032010 with BMS to Prox and Distal RCA and staged DES to LAD), ischemic cardiomyopathy, SSS (s/p St. Jude BiV PPM placement in 01/2016), PAF, HTN, HLD, and Stage 3 CKD who presents to the office today for hospital follow-up.  He was recently admitted from 10/20/2017 to 10/23/2017 for evaluation of chest pain and found to have an NSTEMI with troponin values peaking at 0.15. Echocardiogram showed a reduced EF 40 to 45% with diffuse hypokinesis and mild MR. He underwent a repeat cardiac catheterization on 10/21/2017 which showed diffuse in-stent restenosis within the ostial to proximal RCA and high-grade stenosis along the mid RCA. PCI was thought to be high risk and a femoral approach was recommended, therefore he went back to the catheterization lab on 10/22/2017 for successful orbital atherectomy and balloon angioplasty with stenting of the proximal RCA and mid RCA with 2 DES.  He was restarted on Xarelto the morning following the procedure and it was recommended to continue ASA, Plavix, and Xarelto for 1 month then stopping aspirin and continuing on Plavix and Xarelto for 6 months. Following the procedure, no immediate complications were noted. He was noted to have elevated BP the morning following the procedure and was started on Losartan 25 mg daily prior to discharge with plans for a repeat CBC and BMET in 1 week.  He was discharged from the hospital on 10/23/2017 but returned to the emergency department later that afternoon for recurrent chest pain after consuming a sandwich and french fries for lunch. His symptoms resolved with the  use of SL NTG and he was evaluated by the Cardiology fellow and it was felt his pain was likely related to his recent procedure. Initial and delta troponin values were flat at 0.25 and 0.26 with his EKG showing no acute ischemic changes. He was started on Isosorbide Dinitrate 30 mg twice daily and outpatient follow-up was arranged.   In talking with the patient and his wife today, he reports overall doing well from a cardiac perspective since his hospitalization. He denies any recurrent chest pain or dyspnea on exertion.  He does report feeling "drained" but says he has been less active over the past few weeks due to not wanting to "overdo it". He denies any specific orthopnea, PND, or lower extremity edema. Did experience significant bruising along his right groin cath site but says this has improved.  He denies any evidence of melena, hematochezia, or hematuria. Reports good compliance with ASA, Plavix, and Xarelto.   Past Medical History:  Diagnosis Date  . CAD (coronary artery disease)    a. STEMI 04/2008 s/p BMS to prox & distal RCA, staged DES to LAD same admission. b. Nuc 03/2017: scar but no ischemia, EF 45-54%. c. 09/2017: NSTEMI - DES x 2 to proximal and mid-RCA  . CKD (chronic kidney disease), stage II   . Cough, persistent   . GERD (gastroesophageal reflux disease)   . History of BPH   . HTN (hypertension)   . Hypercholesteremia   . LV dysfunction    a. EF 45-50% in 01/2016.  [name]  Mild pulmonary hypertension ([place])   . PAF (paroxysmal atrial fibrillation) ([place])   . Presence of permanent cardiac pacemaker   . Sinus node dysfunction ([place])    a. s/p StJude CRT-pacemaker 01/2016.    Past Surgical History:  Procedure Laterality Date  . CARDIAC CATHETERIZATION    . COLONOSCOPY    . COLONOSCOPY N/A 08/27/2017   Procedure: COLONOSCOPY;  Surgeon: [name], MD;  [facility];  Service: Endoscopy;  Laterality: N/A;  1:00pm  . CORONARY STENT INTERVENTION N/A 10/22/2017    Procedure: CORONARY STENT INTERVENTION;  Surgeon: [name], MD;  Location: [facility];  Service: Cardiovascular;  Laterality: N/A;  . CORONARY STENT PLACEMENT  04/2008   RCA and LAD  . EP IMPLANTABLE DEVICE N/A 01/09/2016   Procedure: BiV Pacemaker Insertion CRT-P;  Surgeon: [name], MD;  Location: [facility];  Service: Cardiovascular;  Laterality: N/A;  . ESOPHAGOGASTRODUODENOSCOPY N/A 08/27/2017   Procedure: ESOPHAGOGASTRODUODENOSCOPY (EGD);  Surgeon: [name], MD;  [facility];  Service: Endoscopy;  Laterality: N/A;  . INSERT / REPLACE / REMOVE PACEMAKER    . MALONEY DILATION N/A 08/27/2017   Procedure: [name] DILATION;  Surgeon: [name], MD;  [facility];  Service: Endoscopy;  Laterality: N/A;  . RIGHT/LEFT HEART CATH AND CORONARY ANGIOGRAPHY N/A 10/21/2017   Procedure: RIGHT/LEFT HEART CATH AND CORONARY ANGIOGRAPHY;  Surgeon: [name], MD;  Location: [facility];  Service: Cardiovascular;  Laterality: N/A;    Current Medications: Outpatient Medications Prior to Visit  Medication Sig Dispense Refill  . acetaminophen (TYLENOL) 500 MG tablet Take 500 mg by mouth every 6 (six) hours as needed for pain.    [name] aspirin EC 81 MG EC tablet Take 1 tablet (81 mg total) by mouth daily for 30 doses. 30 tablet 0  . cholecalciferol (VITAMIN D) 1000 units tablet Take 1,000 Units by mouth 2 (two) times daily.    . clopidogrel (PLAVIX) 75 MG tablet Take 1 tablet (75 mg total) by mouth daily with breakfast. 30 tablet 11  . ferrous sulfate 325 (65 FE) MG tablet Take 325 mg by mouth 3 (three) times daily.    . hydrochlorothiazide (HYDRODIURIL) 25 MG tablet TAKE ONE (1) TABLET EACH DAY 90 tablet 3  . isosorbide dinitrate (ISORDIL) 30 MG tablet Take 1 tablet (30 mg total) by mouth 2 (two) times daily for 14 days. 28 tablet 0  . latanoprost (XALATAN) 0.005 % ophthalmic solution Place 1 drop into both eyes at bedtime.    [name] losartan  (COZAAR) 25 MG tablet Take 1 tablet (25 mg total) by mouth daily. 30 tablet 5  . lovastatin (MEVACOR) 10 MG tablet TAKE ONE (1) TABLET BY MOUTH EVERY DAY 30 tablet 11  . metoprolol succinate (TOPROL XL) 25 MG 24 hr tablet Take 1 tablet (25 mg total) by mouth daily. 30 tablet 5  . nitroGLYCERIN (NITROSTAT) 0.4 MG SL tablet Place 1 tablet (0.4 mg total) under the tongue every 5 (five) minutes x 3 doses as needed for chest pain. 25 tablet 2  . pantoprazole (PROTONIX) 40 MG tablet Take 40 mg by mouth daily.    [name] Propylene Glycol (SYSTANE BALANCE OP) Place 1 drop into both eyes 2 (two) times daily as needed (dry eyes).     . rivaroxaban (XARELTO) 20 MG TABS tablet Take 1 tablet (20 mg total) by mouth daily with supper. Dx: paroxysmal atrial fibrillation 90 tablet 3  .  tamsulosin (FLOMAX) 0.4 MG CAPS capsule Take 0.4 mg by mouth every evening.     . triamcinolone cream (KENALOG) 0.1 % Apply 1 application topically daily as needed (itching).     . vitamin B-12 (CYANOCOBALAMIN) 1000 MCG tablet Take 1,000 mcg by mouth daily.     No facility-administered medications prior to visit.      Allergies:   Ace inhibitors and Tape   Social History   Socioeconomic History  . Marital status: Married    Spouse name: Not on file  . Number of children: Not on file  . Years of education: Not on file  . Highest education level: Not on file  Occupational History  . Occupation: retired    Comment: Geographical information systems officer  . Financial resource strain: Not on file  . Food insecurity:    Worry: Not on file    Inability: Not on file  . Transportation needs:    Medical: Not on file    Non-medical: Not on file  Tobacco Use  . Smoking status: Former Smoker    Packs/day: 2.50    Years: 25.00    Pack years: 62.50    Types: Cigarettes    Last attempt to quit: 06/14/1973    Years since quitting: 44.4  . Smokeless tobacco: Former Systems developer    Types: Chew    Quit date: 06/14/1973  . Tobacco comment: some/chewed very  little for about 2 years  Substance and Sexual Activity  . Alcohol use: No  . Drug use: No  . Sexual activity: Not on file  Lifestyle  . Physical activity:    Days per week: Not on file    Minutes per session: Not on file  . Stress: Not on file  Relationships  . Social connections:    Talks on phone: Not on file    Gets together: Not on file    Attends religious service: Not on file    Active member of club or organization: Not on file    Attends meetings of clubs or organizations: Not on file    Relationship status: Not on file  Other Topics Concern  . 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      Family History:  The patient's family history includes Cancer in his mother; Emphysema in his mother.   Review of Systems:   Please see the history of present illness.     General:  No chills, fever, night sweats or weight changes. Positive for fatigue.  Cardiovascular:  No chest pain, dyspnea on exertion, edema, orthopnea, palpitations, paroxysmal nocturnal dyspnea. Dermatological: No rash, lesions/masses Respiratory: No cough, dyspnea Urologic: No hematuria, dysuria Abdominal:   No nausea, vomiting, diarrhea, bright red blood per rectum, melena, or hematemesis Neurologic:  No visual changes, wkns, changes in mental status. All other systems reviewed and are otherwise negative except as noted above.   Physical Exam:    VS:  BP (!) 132/58   Pulse 60   Ht 6\' 1"  (1.854 m)   Wt 205 lb (93 kg)   SpO2 98%   BMI 27.05 kg/m    General: Well developed, well nourished Caucasian male appearing in no acute distress. Head: Normocephalic, atraumatic, sclera non-icteric, no xanthomas, nares are without discharge.  Neck: No carotid bruits. JVD not elevated.  Lungs: Respirations regular and unlabored, without wheezes or rales.  Heart: Regular rate and rhythm. No S3 or S4.  No murmur, no rubs,  or gallops appreciated. Abdomen: Soft, non-tender,  non-distended with normoactive bowel sounds. No hepatomegaly. No rebound/guarding. No obvious abdominal masses. Msk:  Strength and tone appear normal for age. No joint deformities or effusions. Extremities: No clubbing or cyanosis. No edema.  Distal pedal pulses are 2+ bilaterally. Right radial site without ecchymosis or a hematoma. Small, firm hematoma appreciated along right groin site (decreasing in size per patient's report).  Neuro: Alert and oriented X 3. Moves all extremities spontaneously. No focal deficits noted. Psych:  Responds to questions appropriately with a normal affect. Skin: No rashes or lesions noted  Wt Readings from Last 3 Encounters:  11/12/17 205 lb (93 kg)  11/09/17 203 lb 6.4 oz (92.3 kg)  10/23/17 204 lb 9.4 oz (92.8 kg)     Studies/Labs Reviewed:   EKG:  EKG is not ordered today.   Recent Labs: 06/29/2017: B Natriuretic Peptide 349.0 07/15/2017: Magnesium 1.9 10/20/2017: ALT 13 11/12/2017: BUN 13; Creatinine, Ser 1.14; Hemoglobin 13.0; Platelets 137; Potassium 3.6; Sodium 132   Lipid Panel    Component Value Date/Time   CHOL 104 03/01/2017 1004   TRIG 54 03/01/2017 1004   HDL 33 (L) 03/01/2017 1004   CHOLHDL 3.2 03/01/2017 1004   VLDL 23 03/06/2014 0903   LDLCALC 58 03/01/2017 1004    Additional studies/ records that were reviewed today include:    Echocardiogram: 10/20/2017 Study Conclusions  - Left ventricle: The cavity size was normal. Wall thickness was   increased in a pattern of moderate to severe LVH. Systolic   function was mildly to moderately reduced. The estimated ejection   fraction was in the range of 40% to 45%. Diffuse hypokinesis. The   study is not technically sufficient to allow evaluation of LV   diastolic function. - Mitral valve: Moderately to severely calcified annulus. Mildly   thickened leaflets . There was mild regurgitation. - Left atrium: The atrium was severely dilated. - Right ventricle: The cavity size was mildly  dilated. Systolic   function was mildly to moderately reduced. - Right atrium: The atrium was mildly dilated. - Pulmonary arteries: Systolic pressure was moderately increased.   PA peak pressure: 60 mm Hg (S). - Limited study to evaluate LV function and wall motion.   Cardiac Catheterization: 10/21/2017  Diffuse in-stent restenosis within the ostial to proximal RCA stent.  High-grade mid RCA stenosis within a region of tortuosity.  Distal RCA is large.  Normal left main.  Large widely patent LAD including proximal stent.  Large normal ramus  Relatively small normal circumflex.  LVEF estimated 30 to 35%.  LVEDP 17 mmHg  RECOMMENDATIONS:   Given reduced LV function, anterior origin of the right coronary from the right sinus of Valsalva, diffuse calcified in-stent restenosis in the ostial to proximal RCA, and high-grade potentially calcified stenosis in the mid vessel within angulated segment, PCI will be high risk.  Recommend femoral approach.  Discussed with Dr. Burt Knack.  Loaded with Plavix.  Patient is on chronic anticoagulation therapy.  Coronary Stent Intervention: 10/22/2017 Successful orbital atherectomy, noncompliant balloon angioplasty, Cutting Balloon angioplasty, and stenting of severe stenosis in the mid RCA and severe in-stent restenosis in the proximal and ostial RCA using 2 DES platforms (3.5x15 mm Orsiro in the mid vessel and 3.5x18 mm Orsiro in the proximal vessel)  Recommend to resume Rivaroxaban, at currently prescribed dose and frequency, on 10/23/2017.  Recommend concurrent antiplatelet therapy of Aspirin 81mg  daily for 1 month and Clopidogrel 75mg  daily for 6 months.   Assessment:  1. Coronary artery disease involving native coronary artery of native heart without angina pectoris   2. Ischemic cardiomyopathy   3. Complete heart block ([place])   4. PAF (paroxysmal atrial fibrillation) ([place])   5. Essential hypertension   6. Hyperlipidemia LDL goal <70     7. Anemia, unspecified type   8. Medication management      Plan:   In order of problems listed above:  1. CAD - s/p STEMI in 032010 with BMS to Prox and Distal RCA and staged DES to LAD. He was recenty admitted for an NSTEMI and underwent successful orbital atherectomy and balloon angioplasty with stenting of the proximal RCA and mid RCA with 2 DES. He was to continue on ASA, Plavix, and Xarelto for 1 month with plans to stop ASA and continuing on Plavix and Xarelto for 6 months.  - he denies any recent chest pain or dyspnea on exertion. We reviewed Cardiac Rehab and he was encouraged to participate in this.  - continue on ASA (until 11/22/2017), Plavix, BB, Imdur, and statin therapy.   2. Ischemic Cardiomyopathy - EF reduced to 40 to 45% by most recent echocardiogram in 09/2017. He denies any recent dyspnea on exertion, orthopnea, or PND. Appears euvolemic by examination today.  - continue HCTZ 25mg  daily, Losartan 25mg  daily, and Toprol-XL 25mg  daily.   3. SSS - s/p St. Jude BiV PPM placement in 01/2016. Most recent device check in 06/2017 showed normal device function. Followed by Dr. Lovena Le.   4. Paroxysmal Atrial Fibrillation - he denies any recent palpitations. HR well-controlled in the 60's during today's visit. Continue Toprol-XL 25mg  daily for rate-control.  - he denies any evidence of active bleeding. Remains on Xarelto for anticoagulation.   5. HTN - BP is well-controlled at 132/58 during today's visit.  - continue HCTZ, Imdur, Losartan, and Toprol-XL at current dosing.   6. HLD - followed by PCP. Goal LDL is < 70 with known CAD. Remains on Lovastatin 10mg  daily.   7. Anemia - Hgb down to 11.7 following recent cardiac catheterization. Will recheck CBC today.    Medication Adjustments/Labs and Tests Ordered: Current medicines are reviewed at length with the patient today.  Concerns regarding medicines are outlined above.  Medication changes, Labs and Tests ordered  today are listed in the Patient Instructions below. Patient Instructions  Medication Instructions:  Your physician recommends that you continue on your current medications as directed. Please refer to the Current Medication list given to you today.  Stop Take Aspirin on September 23     Labwork: Your physician recommends that you return for lab work today.   Testing/Procedures: NONE   Follow-Up: Your physician recommends that you schedule a follow-up appointment in: 3 Months with Dr. Bronson Ing.    Any Other Special Instructions Will Be Listed Below (If Applicable).   If you need a refill on your cardiac medications before your next appointment, please call your pharmacy. Thank you for choosing [facility]!    Signed, [name] Heritage, PA-C  11/12/2017 8:51 PM    [facility] [address] [place], [place] [address] Phone: [phone]

## 2017-11-12 ENCOUNTER — Ambulatory Visit: Payer: Medicare Other | Admitting: Student

## 2017-11-12 ENCOUNTER — Other Ambulatory Visit (HOSPITAL_COMMUNITY)
Admission: RE | Admit: 2017-11-12 | Discharge: 2017-11-12 | Disposition: A | Discharge status: Home / Self Care | Payer: Medicare Other | Source: Ambulatory Visit | Attending: Student | Admitting: Student

## 2017-11-12 ENCOUNTER — Telehealth: Payer: Self-pay | Admitting: *Deleted

## 2017-11-12 ENCOUNTER — Encounter: Payer: Self-pay | Admitting: Student

## 2017-11-12 ENCOUNTER — Encounter

## 2017-11-12 VITALS — BP 132/58 | HR 60 | Ht 73.0 in | Wt 205.0 lb

## 2017-11-12 DIAGNOSIS — I255 Ischemic cardiomyopathy: Secondary | ICD-10-CM | POA: Diagnosis not present

## 2017-11-12 DIAGNOSIS — I480 Paroxysmal atrial fibrillation: Secondary | ICD-10-CM | POA: Diagnosis not present

## 2017-11-12 DIAGNOSIS — I10 Essential (primary) hypertension: Secondary | ICD-10-CM | POA: Diagnosis not present

## 2017-11-12 DIAGNOSIS — I2510 Atherosclerotic heart disease of native coronary artery without angina pectoris: Secondary | ICD-10-CM | POA: Diagnosis not present

## 2017-11-12 DIAGNOSIS — I442 Atrioventricular block, complete: Secondary | ICD-10-CM

## 2017-11-12 DIAGNOSIS — Z79899 Other long term (current) drug therapy: Secondary | ICD-10-CM | POA: Insufficient documentation

## 2017-11-12 DIAGNOSIS — E785 Hyperlipidemia, unspecified: Secondary | ICD-10-CM

## 2017-11-12 DIAGNOSIS — D649 Anemia, unspecified: Secondary | ICD-10-CM

## 2017-11-12 LAB — CBC WITH DIFFERENTIAL/PLATELET
Basophils Absolute: 0 10*3/uL (ref 0.0–0.1)
Basophils Relative: 1 %
Eosinophils Absolute: 0.2 10*3/uL (ref 0.0–0.7)
Eosinophils Relative: 3 %
HEMATOCRIT: 38.6 % — AB (ref 39.0–52.0)
HEMOGLOBIN: 13 g/dL (ref 13.0–17.0)
LYMPHS ABS: 1 10*3/uL (ref 0.7–4.0)
Lymphocytes Relative: 16 %
MCH: 32.6 pg (ref 26.0–34.0)
MCHC: 33.7 g/dL (ref 30.0–36.0)
MCV: 96.7 fL (ref 78.0–100.0)
MONOS PCT: 9 %
Monocytes Absolute: 0.5 10*3/uL (ref 0.1–1.0)
NEUTROS ABS: 4.2 10*3/uL (ref 1.7–7.7)
NEUTROS PCT: 71 %
Platelets: 137 10*3/uL — ABNORMAL LOW (ref 150–400)
RBC: 3.99 MIL/uL — ABNORMAL LOW (ref 4.22–5.81)
RDW: 14.1 % (ref 11.5–15.5)
WBC: 5.9 10*3/uL (ref 4.0–10.5)

## 2017-11-12 LAB — BASIC METABOLIC PANEL
ANION GAP: 6 (ref 5–15)
BUN: 13 mg/dL (ref 8–23)
CHLORIDE: 97 mmol/L — AB (ref 98–111)
CO2: 29 mmol/L (ref 22–32)
CREATININE: 1.14 mg/dL (ref 0.61–1.24)
Calcium: 9.2 mg/dL (ref 8.9–10.3)
GFR calc Af Amer: >60 mL/min (ref >60)
GFR calc non Af Amer: 58 mL/min — ABNORMAL LOW (ref >60)
Glucose, Bld: 98 mg/dL (ref 70–99)
Potassium: 3.6 mmol/L (ref 3.5–5.1)
Sodium: 132 mmol/L — ABNORMAL LOW (ref 135–145)

## 2017-11-12 NOTE — Patient Instructions (Signed)
Medication Instructions:  Your physician recommends that you continue on your current medications as directed. Please refer to the Current Medication list given to you today.  Stop Take Aspirin on September 23     Labwork: Your physician recommends that you return for lab work today.   Testing/Procedures: NONE   Follow-Up: Your physician recommends that you schedule a follow-up appointment in: 3 Months with Dr. Bronson Ing.    Any Other Special Instructions Will Be Listed Below (If Applicable).     If you need a refill on your cardiac medications before your next appointment, please call your pharmacy. Thank you for choosing [facility]!

## 2017-11-12 NOTE — Telephone Encounter (Signed)
Called patient with test results. No answer. Left message to call back.  

## 2017-11-12 NOTE — Telephone Encounter (Signed)
-----   Message from [name] Heritage, [place] sent at 11/12/2017  4:45 PM EDT ----- Please let the patient know that his hemoglobin and platelet count has improved and is within a normal range. Kidney function is overall stable with no significant changes as compared to prior values. Na+ low. Would recommend a repeat BMET in 3-4 weeks to reassess kidney function and sodium levels. Can be obtained here or with PCP. Please forward a copy of his labs to Sinda Du, MD. Thank you.

## 2017-11-15 ENCOUNTER — Telehealth: Payer: Self-pay | Admitting: Student

## 2017-11-15 DIAGNOSIS — Z79899 Other long term (current) drug therapy: Secondary | ICD-10-CM

## 2017-11-15 NOTE — Telephone Encounter (Signed)
Will mail lab slips.

## 2017-11-15 NOTE — Telephone Encounter (Signed)
lvm returning call from Friday

## 2017-11-15 NOTE — Telephone Encounter (Signed)
Spoke with pt. Advised him of lab results. Will mail lab slips for repeat lab order.

## 2017-11-16 DIAGNOSIS — I480 Paroxysmal atrial fibrillation: Secondary | ICD-10-CM | POA: Diagnosis not present

## 2017-11-16 DIAGNOSIS — I2510 Atherosclerotic heart disease of native coronary artery without angina pectoris: Secondary | ICD-10-CM | POA: Diagnosis not present

## 2017-11-16 DIAGNOSIS — Z950 Presence of cardiac pacemaker: Secondary | ICD-10-CM | POA: Diagnosis not present

## 2017-11-16 DIAGNOSIS — Z23 Encounter for immunization: Secondary | ICD-10-CM | POA: Diagnosis not present

## 2017-11-16 DIAGNOSIS — I10 Essential (primary) hypertension: Secondary | ICD-10-CM | POA: Diagnosis not present

## 2017-11-17 ENCOUNTER — Encounter: Payer: Self-pay | Admitting: Gastroenterology

## 2017-11-18 ENCOUNTER — Telehealth: Payer: Self-pay | Admitting: Gastroenterology

## 2017-11-18 NOTE — Assessment & Plan Note (Signed)
Surveillance recommended in 6 months due to multiple polyps, advanced adenoma. In light of recent NSTEMI, will ensure stable from cardiac perspective prior. Return in Feb 2020.

## 2017-11-18 NOTE — Assessment & Plan Note (Signed)
On chronic Xarelto and now on Plavix and aspirin after NSTEMI Aug 2019. EGD/TCS completed with source of IDA likely secondary to numerous adenomas and advanced polyp. Will need surveillance colonoscopy in 6 months but will need to ensure he can hold Xarelto safely. Would favor being off Plavix at that time prior to elective surveillance and can likely hold Xarelto 48 hours (has been on for afib chronically). Will have him return in Feb 2020 to evaluate again. Recent CBC normal. Continue with iron.

## 2017-11-18 NOTE — Telephone Encounter (Signed)
Please have patient return in Feb 2020. He will likely be off Plavix around that time. Can discuss surveillance colonoscopy, as he will need one around that time due to numerous polyps, advanced adenoma.

## 2017-11-22 ENCOUNTER — Encounter: Payer: Self-pay | Admitting: Gastroenterology

## 2017-11-22 ENCOUNTER — Other Ambulatory Visit: Payer: Self-pay | Admitting: Cardiology

## 2017-11-22 NOTE — Progress Notes (Signed)
CC'D TO PCP °

## 2017-11-22 NOTE — Telephone Encounter (Signed)
PATIENT SCHEDULED AND LETTER SENT  °

## 2017-11-23 LAB — CUP PACEART REMOTE DEVICE CHECK
Battery Remaining Percentage: 95.5 %
Brady Statistic AP VP Percent: 81 %
Brady Statistic AS VP Percent: 18 %
Brady Statistic AS VS Percent: 1 %
Brady Statistic RA Percent Paced: 36 %
Date Time Interrogation Session: 20190820140831
Implantable Lead Implant Date: 20171109
Implantable Lead Implant Date: 20171109
Implantable Lead Implant Date: 20171109
Implantable Lead Location: 753858
Implantable Lead Location: 753860
Lead Channel Impedance Value: 450 Ohm
Lead Channel Impedance Value: 590 Ohm
Lead Channel Pacing Threshold Amplitude: 0.5 V
Lead Channel Pacing Threshold Amplitude: 1.25 V
Lead Channel Pacing Threshold Pulse Width: 0.5 ms
Lead Channel Pacing Threshold Pulse Width: 0.5 ms
Lead Channel Pacing Threshold Pulse Width: 0.8 ms
Lead Channel Sensing Intrinsic Amplitude: 5 mV
Lead Channel Setting Pacing Amplitude: 2 V
Lead Channel Setting Pacing Amplitude: 2.5 V
Lead Channel Setting Pacing Pulse Width: 0.5 ms
Lead Channel Setting Sensing Sensitivity: 4 mV
MDC IDC LEAD LOCATION: 753859
MDC IDC MSMT BATTERY REMAINING LONGEVITY: 86 mo
MDC IDC MSMT BATTERY VOLTAGE: 2.98 V
MDC IDC MSMT LEADCHNL LV IMPEDANCE VALUE: 590 Ohm
MDC IDC MSMT LEADCHNL RA PACING THRESHOLD AMPLITUDE: 0.5 V
MDC IDC MSMT LEADCHNL RV SENSING INTR AMPL: 12 mV
MDC IDC PG IMPLANT DT: 20171109
MDC IDC SET LEADCHNL LV PACING PULSEWIDTH: 0.8 ms
MDC IDC SET LEADCHNL RA PACING AMPLITUDE: 2 V
MDC IDC STAT BRADY AP VS PERCENT: 1 %
Pulse Gen Serial Number: 7949810

## 2018-01-11 ENCOUNTER — Other Ambulatory Visit: Payer: Self-pay | Admitting: Cardiovascular Disease

## 2018-01-18 ENCOUNTER — Ambulatory Visit (INDEPENDENT_AMBULATORY_CARE_PROVIDER_SITE_OTHER): Payer: Medicare Other

## 2018-01-18 DIAGNOSIS — I442 Atrioventricular block, complete: Secondary | ICD-10-CM | POA: Diagnosis not present

## 2018-01-19 NOTE — Progress Notes (Signed)
Remote pacemaker transmission.   

## 2018-02-03 ENCOUNTER — Ambulatory Visit (INDEPENDENT_AMBULATORY_CARE_PROVIDER_SITE_OTHER): Payer: Medicare Other | Admitting: Internal Medicine

## 2018-02-03 ENCOUNTER — Encounter: Payer: Self-pay | Admitting: Internal Medicine

## 2018-02-03 VITALS — BP 138/66 | HR 68 | Ht 73.0 in | Wt 204.4 lb

## 2018-02-03 DIAGNOSIS — I459 Conduction disorder, unspecified: Secondary | ICD-10-CM

## 2018-02-03 DIAGNOSIS — I5022 Chronic systolic (congestive) heart failure: Secondary | ICD-10-CM

## 2018-02-03 DIAGNOSIS — I480 Paroxysmal atrial fibrillation: Secondary | ICD-10-CM

## 2018-02-03 LAB — CUP PACEART INCLINIC DEVICE CHECK
Brady Statistic RA Percent Paced: 42 %
Brady Statistic RV Percent Paced: 99 %
Implantable Lead Implant Date: 20171109
Implantable Lead Implant Date: 20171109
Implantable Lead Location: 753859
Lead Channel Impedance Value: 450 Ohm
Lead Channel Impedance Value: 687.5 Ohm
Lead Channel Pacing Threshold Amplitude: 0.5 V
Lead Channel Pacing Threshold Amplitude: 1.75 V
Lead Channel Pacing Threshold Pulse Width: 0.5 ms
Lead Channel Pacing Threshold Pulse Width: 0.8 ms
Lead Channel Sensing Intrinsic Amplitude: 1 mV
Lead Channel Setting Pacing Amplitude: 2 V
Lead Channel Setting Pacing Pulse Width: 0.5 ms
Lead Channel Setting Pacing Pulse Width: 0.8 ms
MDC IDC LEAD IMPLANT DT: 20171109
MDC IDC LEAD LOCATION: 753858
MDC IDC LEAD LOCATION: 753860
MDC IDC MSMT BATTERY REMAINING LONGEVITY: 82 mo
MDC IDC MSMT BATTERY VOLTAGE: 2.98 V
MDC IDC MSMT LEADCHNL LV PACING THRESHOLD AMPLITUDE: 1.75 V
MDC IDC MSMT LEADCHNL LV PACING THRESHOLD PULSEWIDTH: 0.8 ms
MDC IDC MSMT LEADCHNL RV IMPEDANCE VALUE: 537.5 Ohm
MDC IDC MSMT LEADCHNL RV PACING THRESHOLD AMPLITUDE: 0.5 V
MDC IDC MSMT LEADCHNL RV PACING THRESHOLD PULSEWIDTH: 0.5 ms
MDC IDC PG IMPLANT DT: 20171109
MDC IDC SESS DTM: 20191205110732
MDC IDC SET LEADCHNL RA PACING AMPLITUDE: 2 V
MDC IDC SET LEADCHNL RV PACING AMPLITUDE: 2.5 V
MDC IDC SET LEADCHNL RV SENSING SENSITIVITY: 4 mV
Pulse Gen Serial Number: 7949810

## 2018-02-03 NOTE — Patient Instructions (Signed)
Your physician wants you to follow-up in: 1 year with Dr.Taylor You will receive a reminder letter in the mail two months in advance. If you don't receive a letter, please call our office to schedule the follow-up appointment.     Your physician recommends that you continue on your current medications as directed. Please refer to the Current Medication list given to you today.      If you need a refill on your cardiac medications before your next appointment, please call your pharmacy.      No lab work or tests today.     Thank you for choosing [facility] !

## 2018-02-03 NOTE — Progress Notes (Signed)
HPI [name] returns today for followup. He is a pleasant 84 yo man with CHB, s/p PPM insertion with a BiV device placed due to mild LV dysfunction with an EF of 45%. He has developed atrial fib but is asymptomatic from a the perspective of palpitations. He admits to dietary indiscretion and has had mild peripheral edema. No other complaints today. He sustained an MI back in August. He feels better now. He has stopped his Hydrogen pump blocker and his cough has worsened. Allergies  Allergen Reactions  . Ace Inhibitors Nausea Only  . Tape Rash    And Bandaids, too     Current Outpatient Medications  Medication Sig Dispense Refill  . acetaminophen (TYLENOL) 500 MG tablet Take 500 mg by mouth every 6 (six) hours as needed for pain.    . cholecalciferol (VITAMIN D) 1000 units tablet Take 1,000 Units by mouth 2 (two) times daily.    . clopidogrel (PLAVIX) 75 MG tablet Take 1 tablet (75 mg total) by mouth daily with breakfast. 30 tablet 11  . ferrous sulfate 325 (65 FE) MG tablet Take 325 mg by mouth 3 (three) times daily.    . hydrochlorothiazide (HYDRODIURIL) 25 MG tablet TAKE ONE (1) TABLET EACH DAY 90 tablet 3  . latanoprost (XALATAN) 0.005 % ophthalmic solution Place 1 drop into both eyes at bedtime.    [name] losartan (COZAAR) 25 MG tablet TAKE ONE (1) TABLET BY MOUTH EVERY DAY 30 tablet 5  . lovastatin (MEVACOR) 10 MG tablet TAKE ONE (1) TABLET BY MOUTH EVERY DAY 30 tablet 11  . metoprolol succinate (TOPROL XL) 25 MG 24 hr tablet Take 1 tablet (25 mg total) by mouth daily. 30 tablet 5  . nitroGLYCERIN (NITROSTAT) 0.4 MG SL tablet Place 1 tablet (0.4 mg total) under the tongue every 5 (five) minutes x 3 doses as needed for chest pain. 25 tablet 2  . pantoprazole (PROTONIX) 40 MG tablet Take 40 mg by mouth daily.    [name] Propylene Glycol (SYSTANE BALANCE OP) Place 1 drop into both eyes 2 (two) times daily as needed (dry eyes).     . rivaroxaban (XARELTO) 20 MG TABS tablet Take 1 tablet  (20 mg total) by mouth daily with supper. Dx: paroxysmal atrial fibrillation 90 tablet 3  . tamsulosin (FLOMAX) 0.4 MG CAPS capsule Take 0.4 mg by mouth every evening.     . triamcinolone cream (KENALOG) 0.1 % Apply 1 application topically daily as needed (itching).     . vitamin B-12 (CYANOCOBALAMIN) 1000 MCG tablet Take 1,000 mcg by mouth daily.     No current facility-administered medications for this visit.      Past Medical History:  Diagnosis Date  . CAD (coronary artery disease)    a. STEMI 04/2008 s/p BMS to prox & distal RCA, staged DES to LAD same admission. b. Nuc 03/2017: scar but no ischemia, EF 45-54%. c. 09/2017: NSTEMI - DES x 2 to proximal and mid-RCA  . CKD (chronic kidney disease), stage II   . Cough, persistent   . GERD (gastroesophageal reflux disease)   . History of BPH   . HTN (hypertension)   . Hypercholesteremia   . LV dysfunction    a. EF 45-50% in 01/2016.  . Mild pulmonary hypertension ([place])   . PAF (paroxysmal atrial fibrillation) ([place])   . Presence of permanent cardiac pacemaker   . Sinus node dysfunction ([place])    a. s/p StJude CRT-pacemaker 01/2016.  ROS:   All systems reviewed and negative except as noted in the HPI.   Past Surgical History:  Procedure Laterality Date  . CARDIAC CATHETERIZATION    . COLONOSCOPY    . COLONOSCOPY N/A 08/27/2017   Dr. [name]: 25 5-25 mm polyps in descending colon, ascending, cecum. 2X3 cm carpet polyp in base of cecum lifted away s/p piecemeal polypectomy. Tubulovillous adenoma and tubular adenomas  . CORONARY STENT INTERVENTION N/A 10/22/2017   Procedure: CORONARY STENT INTERVENTION;  Surgeon: [name], MD;  Location: [facility];  Service: Cardiovascular;  Laterality: N/A;  . CORONARY STENT PLACEMENT  04/2008   RCA and LAD  . EP IMPLANTABLE DEVICE N/A 01/09/2016   Procedure: BiV Pacemaker Insertion CRT-P;  Surgeon: [name], MD;  Location: [facility];  Service: Cardiovascular;   Laterality: N/A;  . ESOPHAGOGASTRODUODENOSCOPY N/A 08/27/2017   normal esophagus s/p dilation, gastric petechia, small hiatal hernia, normal duodenum  . INSERT / REPLACE / REMOVE PACEMAKER    . MALONEY DILATION N/A 08/27/2017   Procedure: [name] DILATION;  Surgeon: [name], MD;  [facility];  Service: Endoscopy;  Laterality: N/A;  . RIGHT/LEFT HEART CATH AND CORONARY ANGIOGRAPHY N/A 10/21/2017   Procedure: RIGHT/LEFT HEART CATH AND CORONARY ANGIOGRAPHY;  Surgeon: [name], MD;  Location: [facility];  Service: Cardiovascular;  Laterality: N/A;     Family History  Problem Relation Age of Onset  . Emphysema Mother        mother died with emphysema and cancer  . Cancer Mother   . Colon cancer Neg Hx   . Colon polyps Neg Hx      Social History   Socioeconomic History  . Marital status: Married    Spouse name: Not on file  . Number of children: Not on file  . Years of education: Not on file  . Highest education level: Not on file  Occupational History  . Occupation: retired    Comment: Geographical information systems officer  . Financial resource strain: Not on file  . Food insecurity:    Worry: Not on file    Inability: Not on file  . Transportation needs:    Medical: Not on file    Non-medical: Not on file  Tobacco Use  . Smoking status: Former Smoker    Packs/day: 2.50    Years: 25.00    Pack years: 62.50    Types: Cigarettes    Last attempt to quit: 06/14/1973    Years since quitting: 44.6  . Smokeless tobacco: Former Systems developer    Types: Chew    Quit date: 06/14/1973  . Tobacco comment: some/chewed very little for about 2 years  Substance and Sexual Activity  . Alcohol use: No  . Drug use: No  . Sexual activity: Not on file  Lifestyle  . Physical activity:    Days per week: Not on file    Minutes per session: Not on file  . Stress: Not on file  Relationships  . Social connections:    Talks on phone: Not on file    Gets together: Not on file     Attends religious service: Not on file    Active member of club or organization: Not on file    Attends meetings of clubs or organizations: Not on file    Relationship status: Not on file  . Intimate partner violence:    Fear of current or ex partner: Not on file  Emotionally abused: Not on file    Physically abused: Not on file    Forced sexual activity: Not on file  Other Topics Concern  . 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      BP 138/66   Pulse 68   Ht 6\' 1"  (1.854 m)   Wt 204 lb 6.4 oz (92.7 kg)   SpO2 98%   BMI 26.97 kg/m   Physical Exam:  Well appearing NAD HEENT: Unremarkable Neck:  No JVD, no thyromegally Lymphatics:  No adenopathy Back:  No CVA tenderness Lungs:  Clear with no wheezes HEART:  Regular rate rhythm, no murmurs, no rubs, no clicks Abd:  soft, positive bowel sounds, no organomegally, no rebound, no guarding Ext:  2 plus pulses, no edema, no cyanosis, no clubbing Skin:  No rashes no nodules Neuro:  CN II through XII intact, motor grossly intact  EKG - NSR with P synchronous ventricular pacing  DEVICE  Normal device function.  See PaceArt for details.   Assess/Plan: 1. PPM - his St. Jude DDD PM is working normally. 2. Atrial fib/flutter - he is out of rhythm today but his rate is controlled. 3. Chronic systolic heart failure - his symptoms are class 2. No change in his meds. 4. CAD - he is s/p PCI. No anginal symptoms. No change in meds.  [name]

## 2018-02-10 DIAGNOSIS — H35372 Puckering of macula, left eye: Secondary | ICD-10-CM | POA: Diagnosis not present

## 2018-02-10 DIAGNOSIS — H353132 Nonexudative age-related macular degeneration, bilateral, intermediate dry stage: Secondary | ICD-10-CM | POA: Diagnosis not present

## 2018-02-11 ENCOUNTER — Encounter: Payer: Self-pay | Admitting: Cardiovascular Disease

## 2018-02-11 ENCOUNTER — Ambulatory Visit (INDEPENDENT_AMBULATORY_CARE_PROVIDER_SITE_OTHER): Payer: No Typology Code available for payment source | Admitting: Cardiovascular Disease

## 2018-02-11 VITALS — BP 128/66 | HR 68 | Ht 73.0 in | Wt 202.0 lb

## 2018-02-11 DIAGNOSIS — I25118 Atherosclerotic heart disease of native coronary artery with other forms of angina pectoris: Secondary | ICD-10-CM

## 2018-02-11 DIAGNOSIS — E785 Hyperlipidemia, unspecified: Secondary | ICD-10-CM

## 2018-02-11 DIAGNOSIS — I252 Old myocardial infarction: Secondary | ICD-10-CM | POA: Diagnosis not present

## 2018-02-11 DIAGNOSIS — I5022 Chronic systolic (congestive) heart failure: Secondary | ICD-10-CM

## 2018-02-11 DIAGNOSIS — Z950 Presence of cardiac pacemaker: Secondary | ICD-10-CM

## 2018-02-11 DIAGNOSIS — I10 Essential (primary) hypertension: Secondary | ICD-10-CM | POA: Diagnosis not present

## 2018-02-11 DIAGNOSIS — Z955 Presence of coronary angioplasty implant and graft: Secondary | ICD-10-CM | POA: Diagnosis not present

## 2018-02-11 MED ORDER — CLOPIDOGREL BISULFATE 75 MG PO TABS: 75.0000 mg | ORAL_TABLET | Freq: Every day | ORAL | 11 refills | Status: DC

## 2018-02-11 NOTE — Patient Instructions (Signed)
Medication Instructions:  On May 01, 2018, STOP Plavix, start Aspirin 81 mg daily If you need a refill on your cardiac medications before your next appointment, please call your pharmacy.   Lab work: none If you have labs (blood work) drawn today and your tests are completely normal, you will receive your results only by: [name] MyChart Message (if you have MyChart) OR . A paper copy in the mail If you have any lab test that is abnormal or we need to change your treatment, we will call you to review the results.  Testing/Procedures: none  Follow-Up: At [facility], you and your health needs are our priority.  As part of our continuing mission to provide you with exceptional heart care, we have created designated Provider Care Teams.  These Care Teams include your primary Cardiologist (physician) and Advanced Practice Providers (APPs -  Physician Assistants and Nurse Practitioners) who all work together to provide you with the care you need, when you need it. You will need a follow up appointment in 5 months.  Please call our office 2 months in advance to schedule this appointment.  You may see Kate Sable, MD or one of the following Advanced Practice Providers on your designated Care Team:   Bernerd Pho, PA-C [facility]) . Ermalinda Barrios, PA-C ([place])  Any Other Special Instructions Will Be Listed Below (If Applicable). None

## 2018-02-11 NOTE — Progress Notes (Signed)
SUBJECTIVE: The patient presents for routine follow-up.  He has a history of coronary artery disease with multivessel PCI.  Most recently, he had a non-STEMI in August 2019 and ultimately underwent PCI of the proximal and mid RCA with 2 drug-eluting stents.  He has an ischemic cardiomyopathy and sick sinus syndrome with a biventricular pacemaker and follows with Dr. Lovena Le for this, most recently on 02/03/2018.  He is doing well and denies chest pain and palpitations.  He walks with a cane and denies significant exertional dyspnea.  He has some mild bilateral ankle swelling at the end of the day.  He has occasional lightheadedness and dizziness but denies syncope and falls.  He does say his legs feel wobbly at times.  He has had some headaches.  He has had some blurry vision as well.     Review of Systems: As per "subjective", otherwise negative.  Allergies  Allergen Reactions  . Ace Inhibitors Nausea Only  . Tape Rash    And Bandaids, too    Current Outpatient Medications  Medication Sig Dispense Refill  . acetaminophen (TYLENOL) 500 MG tablet Take 500 mg by mouth every 6 (six) hours as needed for pain.    . cholecalciferol (VITAMIN D) 1000 units tablet Take 1,000 Units by mouth 2 (two) times daily.    . clopidogrel (PLAVIX) 75 MG tablet Take 1 tablet (75 mg total) by mouth daily with breakfast. 30 tablet 11  . ferrous sulfate 325 (65 FE) MG tablet Take 325 mg by mouth 3 (three) times daily.    . hydrochlorothiazide (HYDRODIURIL) 25 MG tablet TAKE ONE (1) TABLET EACH DAY 90 tablet 3  . losartan (COZAAR) 25 MG tablet TAKE ONE (1) TABLET BY MOUTH EVERY DAY 30 tablet 5  . lovastatin (MEVACOR) 10 MG tablet TAKE ONE (1) TABLET BY MOUTH EVERY DAY 30 tablet 11  . metoprolol succinate (TOPROL XL) 25 MG 24 hr tablet Take 1 tablet (25 mg total) by mouth daily. 30 tablet 5  . nitroGLYCERIN (NITROSTAT) 0.4 MG SL tablet Place 1 tablet (0.4 mg total) under the tongue every 5 (five) minutes x  3 doses as needed for chest pain. 25 tablet 2  . pantoprazole (PROTONIX) 40 MG tablet Take 40 mg by mouth daily.    [name] Propylene Glycol (SYSTANE BALANCE OP) Place 1 drop into both eyes 2 (two) times daily as needed (dry eyes).     . rivaroxaban (XARELTO) 20 MG TABS tablet Take 1 tablet (20 mg total) by mouth daily with supper. Dx: paroxysmal atrial fibrillation 90 tablet 3  . tamsulosin (FLOMAX) 0.4 MG CAPS capsule Take 0.4 mg by mouth every evening.     . triamcinolone cream (KENALOG) 0.1 % Apply 1 application topically daily as needed (itching).     . vitamin B-12 (CYANOCOBALAMIN) 1000 MCG tablet Take 1,000 mcg by mouth daily.     No current facility-administered medications for this visit.     Past Medical History:  Diagnosis Date  . CAD (coronary artery disease)    a. STEMI 04/2008 s/p BMS to prox & distal RCA, staged DES to LAD same admission. b. Nuc 03/2017: scar but no ischemia, EF 45-54%. c. 09/2017: NSTEMI - DES x 2 to proximal and mid-RCA  . CKD (chronic kidney disease), stage II   . Cough, persistent   . GERD (gastroesophageal reflux disease)   . History of BPH   . HTN (hypertension)   . Hypercholesteremia   . LV dysfunction  a. EF 45-50% in 01/2016.  . Mild pulmonary hypertension ([place])   . PAF (paroxysmal atrial fibrillation) ([place])   . Presence of permanent cardiac pacemaker   . Sinus node dysfunction ([place])    a. s/p StJude CRT-pacemaker 01/2016.    Past Surgical History:  Procedure Laterality Date  . CARDIAC CATHETERIZATION    . COLONOSCOPY    . COLONOSCOPY N/A 08/27/2017   Dr. [name]: 25 5-25 mm polyps in descending colon, ascending, cecum. 2X3 cm carpet polyp in base of cecum lifted away s/p piecemeal polypectomy. Tubulovillous adenoma and tubular adenomas  . CORONARY STENT INTERVENTION N/A 10/22/2017   Procedure: CORONARY STENT INTERVENTION;  Surgeon: [name], MD;  Location: [facility];  Service: Cardiovascular;  Laterality: N/A;  . CORONARY STENT  PLACEMENT  04/2008   RCA and LAD  . EP IMPLANTABLE DEVICE N/A 01/09/2016   Procedure: BiV Pacemaker Insertion CRT-P;  Surgeon: [name], MD;  Location: [facility];  Service: Cardiovascular;  Laterality: N/A;  . ESOPHAGOGASTRODUODENOSCOPY N/A 08/27/2017   normal esophagus s/p dilation, gastric petechia, small hiatal hernia, normal duodenum  . INSERT / REPLACE / REMOVE PACEMAKER    . MALONEY DILATION N/A 08/27/2017   Procedure: [name] DILATION;  Surgeon: [name], MD;  [facility];  Service: Endoscopy;  Laterality: N/A;  . RIGHT/LEFT HEART CATH AND CORONARY ANGIOGRAPHY N/A 10/21/2017   Procedure: RIGHT/LEFT HEART CATH AND CORONARY ANGIOGRAPHY;  Surgeon: [name], MD;  Location: [facility];  Service: Cardiovascular;  Laterality: N/A;    Social History   Socioeconomic History  . Marital status: Married    Spouse name: Not on file  . Number of children: Not on file  . Years of education: Not on file  . Highest education level: Not on file  Occupational History  . Occupation: retired    Comment: Geographical information systems officer  . Financial resource strain: Not on file  . Food insecurity:    Worry: Not on file    Inability: Not on file  . Transportation needs:    Medical: Not on file    Non-medical: Not on file  Tobacco Use  . Smoking status: Former Smoker    Packs/day: 2.50    Years: 25.00    Pack years: 62.50    Types: Cigarettes    Last attempt to quit: 06/14/1973    Years since quitting: 44.6  . Smokeless tobacco: Former Systems developer    Types: Chew    Quit date: 06/14/1973  . Tobacco comment: some/chewed very little for about 2 years  Substance and Sexual Activity  . Alcohol use: No  . Drug use: No  . Sexual activity: Not on file  Lifestyle  . Physical activity:    Days per week: Not on file    Minutes per session: Not on file  . Stress: Not on file  Relationships  . Social connections:    Talks on phone: Not on file    Gets together: Not on  file    Attends religious service: Not on file    Active member of club or organization: Not on file    Attends meetings of clubs or organizations: Not on file    Relationship status: Not on file  . Intimate partner violence:    Fear of current or ex partner: Not on file    Emotionally abused: Not on file    Physically abused: Not on file    Forced sexual  activity: Not on file  Other Topics Concern  . 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      Vitals:   02/11/18 1319  BP: 128/66  Pulse: 68  SpO2: 96%  Weight: 202 lb (91.6 kg)  Height: 6\' 1"  (1.854 m)    Wt Readings from Last 3 Encounters:  02/11/18 202 lb (91.6 kg)  02/03/18 204 lb 6.4 oz (92.7 kg)  11/12/17 205 lb (93 kg)     PHYSICAL EXAM General: NAD HEENT: Normal. Neck: No JVD, no thyromegaly. Lungs: Clear to auscultation bilaterally with normal respiratory effort. CV: Regular rate and rhythm, normal S1/S2, no S3/S4, no murmur. No pretibial or periankle edema.  No carotid bruit.   Abdomen: Soft, nontender, no distention.  Neurologic: Alert and oriented.  Psych: Normal affect. Skin: Normal. Musculoskeletal: No gross deformities.    ECG: Reviewed above under Subjective   Labs: Lab Results  Component Value Date/Time   K 3.6 11/12/2017 04:05 PM   BUN 13 11/12/2017 04:05 PM   CREATININE 1.14 11/12/2017 04:05 PM   CREATININE 1.16 (H) 01/07/2016 07:57 AM   ALT 13 10/20/2017 09:20 AM   HGB 13.0 11/12/2017 04:05 PM     Lipids: Lab Results  Component Value Date/Time   LDLCALC 58 03/01/2017 10:04 AM   CHOL 104 03/01/2017 10:04 AM   TRIG 54 03/01/2017 10:04 AM   HDL 33 (L) 03/01/2017 10:04 AM       ASSESSMENT AND PLAN: 1.  Coronary artery disease: Symptomatically stable.  He has a history of multivessel PCI with most recent intervention in August 2019 for a non-STEMI with 2 drug-eluting stents placed to the proximal and mid RCA.  He was initially treated  with aspirin, Plavix, and Xarelto for 1 month with plans to stop aspirin in September and continue Plavix and Xarelto for a total of 6 months.  In early March 2020, Plavix will be stopped and he will be continued on aspirin 81 mg.  Otherwise continue Toprol-XL and lovastatin therapy.  2.  Ischemic cardiomyopathy/chronic systolic heart failure: LVEF 40 to 45% by echocardiogram in August 2019.  NYHA class II symptoms.  Continue Toprol-XL, losartan, and HCTZ 25 mg daily.  3.  Sick sinus syndrome: Biventricular pacemaker in November 2017.  Normal device function noted recently.  Follows with Dr. Lovena Le.  4.  Paroxysmal atrial fibrillation: Heart rates are well controlled on Toprol-XL 25 mg daily.  He remains on Xarelto for systemic anticoagulation.  5.  Hypertension: Blood pressure is normal.  No changes to therapy.  6.  Hyperlipidemia: Continue lovastatin 10 mg.    Disposition: Follow up May 2020   Kate Sable, M.D., F.A.C.C.

## 2018-02-15 DIAGNOSIS — I2510 Atherosclerotic heart disease of native coronary artery without angina pectoris: Secondary | ICD-10-CM | POA: Diagnosis not present

## 2018-02-15 DIAGNOSIS — I10 Essential (primary) hypertension: Secondary | ICD-10-CM | POA: Diagnosis not present

## 2018-02-15 DIAGNOSIS — I482 Chronic atrial fibrillation, unspecified: Secondary | ICD-10-CM | POA: Diagnosis not present

## 2018-02-15 DIAGNOSIS — I5032 Chronic diastolic (congestive) heart failure: Secondary | ICD-10-CM | POA: Diagnosis not present

## 2018-03-10 ENCOUNTER — Other Ambulatory Visit: Payer: Self-pay | Admitting: Cardiovascular Disease

## 2018-03-16 LAB — CUP PACEART REMOTE DEVICE CHECK
Battery Remaining Longevity: 86 mo
Brady Statistic AP VS Percent: 1 %
Brady Statistic AS VS Percent: 1 %
Implantable Lead Implant Date: 20171109
Implantable Lead Location: 753858
Implantable Lead Location: 753860
Implantable Pulse Generator Implant Date: 20171109
Lead Channel Impedance Value: 460 Ohm
Lead Channel Impedance Value: 590 Ohm
Lead Channel Pacing Threshold Amplitude: 0.5 V
Lead Channel Pacing Threshold Amplitude: 1.25 V
Lead Channel Sensing Intrinsic Amplitude: 9.7 mV
Lead Channel Setting Pacing Amplitude: 2 V
Lead Channel Setting Pacing Pulse Width: 0.5 ms
Lead Channel Setting Pacing Pulse Width: 0.8 ms
MDC IDC LEAD IMPLANT DT: 20171109
MDC IDC LEAD IMPLANT DT: 20171109
MDC IDC LEAD LOCATION: 753859
MDC IDC MSMT BATTERY REMAINING PERCENTAGE: 95.5 %
MDC IDC MSMT BATTERY VOLTAGE: 2.98 V
MDC IDC MSMT LEADCHNL LV PACING THRESHOLD PULSEWIDTH: 0.8 ms
MDC IDC MSMT LEADCHNL RA PACING THRESHOLD AMPLITUDE: 0.5 V
MDC IDC MSMT LEADCHNL RA PACING THRESHOLD PULSEWIDTH: 0.5 ms
MDC IDC MSMT LEADCHNL RA SENSING INTR AMPL: 5 mV
MDC IDC MSMT LEADCHNL RV IMPEDANCE VALUE: 590 Ohm
MDC IDC MSMT LEADCHNL RV PACING THRESHOLD PULSEWIDTH: 0.5 ms
MDC IDC PG SERIAL: 7949810
MDC IDC SESS DTM: 20191119124953
MDC IDC SET LEADCHNL LV PACING AMPLITUDE: 2 V
MDC IDC SET LEADCHNL RV PACING AMPLITUDE: 2.5 V
MDC IDC SET LEADCHNL RV SENSING SENSITIVITY: 4 mV
MDC IDC STAT BRADY AP VP PERCENT: 85 %
MDC IDC STAT BRADY AS VP PERCENT: 14 %
MDC IDC STAT BRADY RA PERCENT PACED: 42 %

## 2018-04-05 DIAGNOSIS — Z0000 Encounter for general adult medical examination without abnormal findings: Secondary | ICD-10-CM | POA: Diagnosis not present

## 2018-04-06 DIAGNOSIS — I10 Essential (primary) hypertension: Secondary | ICD-10-CM | POA: Diagnosis not present

## 2018-04-06 DIAGNOSIS — I5022 Chronic systolic (congestive) heart failure: Secondary | ICD-10-CM | POA: Diagnosis not present

## 2018-04-06 DIAGNOSIS — I482 Chronic atrial fibrillation, unspecified: Secondary | ICD-10-CM | POA: Diagnosis not present

## 2018-04-06 DIAGNOSIS — I2510 Atherosclerotic heart disease of native coronary artery without angina pectoris: Secondary | ICD-10-CM | POA: Diagnosis not present

## 2018-04-06 DIAGNOSIS — R001 Bradycardia, unspecified: Secondary | ICD-10-CM | POA: Diagnosis not present

## 2018-04-06 LAB — TSH: TSH: 1.8 (ref <=5.90)

## 2018-04-18 ENCOUNTER — Ambulatory Visit: Payer: Medicare Other | Admitting: Gastroenterology

## 2018-04-19 ENCOUNTER — Ambulatory Visit (INDEPENDENT_AMBULATORY_CARE_PROVIDER_SITE_OTHER): Payer: No Typology Code available for payment source

## 2018-04-19 DIAGNOSIS — I442 Atrioventricular block, complete: Secondary | ICD-10-CM | POA: Diagnosis not present

## 2018-04-19 DIAGNOSIS — I5022 Chronic systolic (congestive) heart failure: Secondary | ICD-10-CM

## 2018-04-20 LAB — CUP PACEART REMOTE DEVICE CHECK
Battery Remaining Percentage: 95.5 %
Battery Voltage: 2.98 V
Brady Statistic AP VP Percent: 98 %
Brady Statistic AS VP Percent: 1.2 %
Brady Statistic RA Percent Paced: 63 %
Date Time Interrogation Session: 20200218193717
Implantable Lead Implant Date: 20171109
Implantable Lead Implant Date: 20171109
Implantable Lead Location: 753860
Lead Channel Impedance Value: 590 Ohm
Lead Channel Impedance Value: 610 Ohm
Lead Channel Pacing Threshold Amplitude: 0.5 V
Lead Channel Sensing Intrinsic Amplitude: 1.5 mV
Lead Channel Sensing Intrinsic Amplitude: 9.7 mV
Lead Channel Setting Pacing Amplitude: 2.5 V
Lead Channel Setting Pacing Pulse Width: 0.8 ms
MDC IDC LEAD IMPLANT DT: 20171109
MDC IDC LEAD LOCATION: 753858
MDC IDC LEAD LOCATION: 753859
MDC IDC MSMT BATTERY REMAINING LONGEVITY: 86 mo
MDC IDC MSMT LEADCHNL LV PACING THRESHOLD AMPLITUDE: 1.75 V
MDC IDC MSMT LEADCHNL LV PACING THRESHOLD PULSEWIDTH: 0.8 ms
MDC IDC MSMT LEADCHNL RA IMPEDANCE VALUE: 490 Ohm
MDC IDC MSMT LEADCHNL RA PACING THRESHOLD PULSEWIDTH: 0.5 ms
MDC IDC MSMT LEADCHNL RV PACING THRESHOLD AMPLITUDE: 0.5 V
MDC IDC MSMT LEADCHNL RV PACING THRESHOLD PULSEWIDTH: 0.5 ms
MDC IDC PG IMPLANT DT: 20171109
MDC IDC PG SERIAL: 7949810
MDC IDC SET LEADCHNL LV PACING AMPLITUDE: 2 V
MDC IDC SET LEADCHNL RA PACING AMPLITUDE: 2 V
MDC IDC SET LEADCHNL RV PACING PULSEWIDTH: 0.5 ms
MDC IDC SET LEADCHNL RV SENSING SENSITIVITY: 4 mV
MDC IDC STAT BRADY AP VS PERCENT: 1 %
MDC IDC STAT BRADY AS VS PERCENT: 1 %

## 2018-04-27 NOTE — Progress Notes (Signed)
Remote pacemaker transmission.   

## 2018-05-02 ENCOUNTER — Encounter: Payer: Self-pay | Admitting: Cardiology

## 2018-05-26 ENCOUNTER — Telehealth: Payer: Self-pay | Admitting: Cardiovascular Disease

## 2018-05-26 ENCOUNTER — Other Ambulatory Visit: Payer: Self-pay | Admitting: Cardiology

## 2018-05-26 NOTE — Telephone Encounter (Signed)
Patient states for past 3 days when he coughs, he brings up blood tinged sputum.He notes his sputum now is "apple green" in color, no fever, no chills,no SOB. He is going to call his pcp tomorrow. I told him I would inform Dr.Koneswaran

## 2018-05-26 NOTE — Telephone Encounter (Signed)
I called and spoke with wife.Patient is out at the drug store and will call us back when he comes home

## 2018-05-26 NOTE — Telephone Encounter (Signed)
I agree. Needs to be evaluated by PCP. Hemoptysis is indicative of an underlying pathology and not due to the medication itself.

## 2018-05-26 NOTE — Telephone Encounter (Signed)
Pt notified and voiced understanding 

## 2018-05-26 NOTE — Telephone Encounter (Signed)
Please give pt a call -- states he's spitting up blood, he's on rivaroxaban (XARELTO) 20 MG TABS tablet [[id number]]  And would like to speak w/ the nurse

## 2018-06-07 ENCOUNTER — Other Ambulatory Visit (HOSPITAL_COMMUNITY): Payer: Self-pay | Admitting: Pulmonary Disease

## 2018-06-07 DIAGNOSIS — R042 Hemoptysis: Secondary | ICD-10-CM

## 2018-06-08 ENCOUNTER — Ambulatory Visit (HOSPITAL_COMMUNITY)
Admission: RE | Admit: 2018-06-08 | Discharge: 2018-06-08 | Disposition: A | Discharge status: Home / Self Care | Payer: Medicare Other | Source: Ambulatory Visit | Attending: Pulmonary Disease | Admitting: Pulmonary Disease

## 2018-06-08 ENCOUNTER — Other Ambulatory Visit: Payer: Self-pay

## 2018-06-08 DIAGNOSIS — R918 Other nonspecific abnormal finding of lung field: Secondary | ICD-10-CM | POA: Diagnosis not present

## 2018-06-08 DIAGNOSIS — R0602 Shortness of breath: Secondary | ICD-10-CM | POA: Diagnosis not present

## 2018-06-08 DIAGNOSIS — R042 Hemoptysis: Secondary | ICD-10-CM | POA: Diagnosis not present

## 2018-06-14 ENCOUNTER — Other Ambulatory Visit: Payer: Self-pay | Admitting: Cardiovascular Disease

## 2018-06-14 MED ORDER — LOVASTATIN 10 MG PO TABS: ORAL_TABLET | ORAL | 3 refills | Status: DC

## 2018-06-14 NOTE — Telephone Encounter (Signed)
° ° ° °  1. Which medications need to be refilled? (please list name of each medication and dose if known) lovastatin (MEVACOR) 10 MG    2. Which pharmacy/location (including street and city if local pharmacy) is medication to be sent to? [facility]  3. Do they need a 30 day or 90 day supply? 90   Patient is requesting a 90 day supply

## 2018-06-14 NOTE — Telephone Encounter (Signed)
Refilled lovastatin

## 2018-06-20 ENCOUNTER — Encounter: Payer: Self-pay | Admitting: Gastroenterology

## 2018-06-20 ENCOUNTER — Ambulatory Visit (INDEPENDENT_AMBULATORY_CARE_PROVIDER_SITE_OTHER): Payer: Medicare Other | Admitting: Gastroenterology

## 2018-06-20 ENCOUNTER — Other Ambulatory Visit: Payer: Self-pay

## 2018-06-20 DIAGNOSIS — Z86010 Personal history of colonic polyps: Secondary | ICD-10-CM

## 2018-06-20 NOTE — Patient Instructions (Signed)
We will see you in June 2020 to arrange a colonoscopy! It is important that we pursue early follow-up with this due to the multiple polyps you had.  Happy Birthday!  I enjoyed talking with you today! As you know, I value our relationship and want to provide genuine, compassionate, and quality care. I welcome your feedback. If you receive a survey regarding your visit,  I greatly appreciate you taking time to fill this out. See you next time!  Annitta Needs, PhD, ANP-BC [facility]

## 2018-06-20 NOTE — Progress Notes (Signed)
Primary Care Physician:  Sinda Du, MD  Primary GI: Dr. [name]   Virtual Visit via Telephone Note Due to COVID-19, visit is conducted virtually and was requested by patient.   I connected with Nicholas Sanchez on 06/20/18 at  1:30 PM EDT by telephone and verified that I am speaking with the correct person using two identifiers.   I discussed the limitations, risks, security and privacy concerns of performing an evaluation and management service by telephone and the availability of in person appointments. I also discussed with the patient that there may be a patient responsible charge related to this service. The patient expressed understanding and agreed to proceed.  Chief Complaint  Patient presents with  . Anemia    f/u. patient states he has been "spitting blood up for 2 weeks"     History of Present Illness: Very pleasant 85 year old male with history of IDA in setting of Xarelto, s/p EGD and colonoscopy. EGD with normal esophagus s/p dilation, gastric petechia, normal duodenum. Colonoscopy with 25 five to 25 mm polyps in descending colon, ascending, cecum. 2X3 cm carpet polyp in base of cecum lifted away s/p piecemeal polypectomy. Tubulovillous adenomas and tubular adenomas. 6 month surveillance recommended (Dec 2019). Admitted Aug 2019 with NSTEMI, repeat cardiac cath with in-stent restenosis within the ostial to proximal RCA and high grade stenosis alon gthe mid RCA. Orbital atherectomy and balloon angioplasty with stenting of proximal RCA and mid RCA with 2 DES. Restarted on Xarelto following procedure with recommendations to continue aspirin, Plavix, and Xarelto for one month then stop aspirin and continue on Plavix along with the Xarelto for 6 months.   No blood in stool. States he has been coughing up blood for a few weeks. Took antibiotics for 10 days. Had xray and told he had pneumonia. Took last antibiotics yesterday. Blood mixed with phlegm. Dr. Luan Pulling has been  managing.  No abdominal pain. No N/V. Protonix once daily. No constipation or diarrhea. Hoarse today. Has frequent headaches. Wants to hold off on surveillance colonoscopy now for a few months. Willing to discuss in June 2020.   Past Medical History:  Diagnosis Date  . CAD (coronary artery disease)    a. STEMI 04/2008 s/p BMS to prox & distal RCA, staged DES to LAD same admission. b. Nuc 03/2017: scar but no ischemia, EF 45-54%. c. 09/2017: NSTEMI - DES x 2 to proximal and mid-RCA  . CKD (chronic kidney disease), stage II   . Cough, persistent   . GERD (gastroesophageal reflux disease)   . History of BPH   . HTN (hypertension)   . Hypercholesteremia   . LV dysfunction    a. EF 45-50% in 01/2016.  . Mild pulmonary hypertension ([place])   . PAF (paroxysmal atrial fibrillation) ([place])   . Presence of permanent cardiac pacemaker   . Sinus node dysfunction ([place])    a. s/p StJude CRT-pacemaker 01/2016.     Past Surgical History:  Procedure Laterality Date  . CARDIAC CATHETERIZATION    . COLONOSCOPY    . COLONOSCOPY N/A 08/27/2017   Dr. [name]: 25 5-25 mm polyps in descending colon, ascending, cecum. 2X3 cm carpet polyp in base of cecum lifted away s/p piecemeal polypectomy. Tubulovillous adenoma and tubular adenomas  . CORONARY STENT INTERVENTION N/A 10/22/2017   Procedure: CORONARY STENT INTERVENTION;  Surgeon: [name], MD;  Location: [facility];  Service: Cardiovascular;  Laterality: N/A;  . CORONARY STENT PLACEMENT  04/2008   RCA and LAD  .  EP IMPLANTABLE DEVICE N/A 01/09/2016   Procedure: BiV Pacemaker Insertion CRT-P;  Surgeon: [name], MD;  Location: [facility];  Service: Cardiovascular;  Laterality: N/A;  . ESOPHAGOGASTRODUODENOSCOPY N/A 08/27/2017   normal esophagus s/p dilation, gastric petechia, small hiatal hernia, normal duodenum  . INSERT / REPLACE / REMOVE PACEMAKER    . MALONEY DILATION N/A 08/27/2017   Procedure: [name] DILATION;  Surgeon: [name], MD;  [facility];  Service: Endoscopy;  Laterality: N/A;  . RIGHT/LEFT HEART CATH AND CORONARY ANGIOGRAPHY N/A 10/21/2017   Procedure: RIGHT/LEFT HEART CATH AND CORONARY ANGIOGRAPHY;  Surgeon: [name], MD;  Location: [facility];  Service: Cardiovascular;  Laterality: N/A;     Current Meds  Medication Sig  . acetaminophen (TYLENOL) 500 MG tablet Take 500 mg by mouth every 6 (six) hours as needed for pain.  [name] aspirin EC 81 MG tablet Take 81 mg by mouth daily.  . cholecalciferol (VITAMIN D) 1000 units tablet Take 1,000 Units by mouth 2 (two) times daily.  . ferrous sulfate 325 (65 FE) MG tablet Take 325 mg by mouth 3 (three) times daily.  . hydrochlorothiazide (HYDRODIURIL) 25 MG tablet TAKE ONE (1) TABLET BY MOUTH EVERY DAY  . losartan (COZAAR) 25 MG tablet TAKE ONE TABLET BY MOUTY EVERY DAY  . lovastatin (MEVACOR) 10 MG tablet TAKE ONE (1) TABLET BY MOUTH EVERY DAY  . nitroGLYCERIN (NITROSTAT) 0.4 MG SL tablet Place 1 tablet (0.4 mg total) under the tongue every 5 (five) minutes x 3 doses as needed for chest pain.  . pantoprazole (PROTONIX) 40 MG tablet Take 40 mg by mouth daily.  [name] Propylene Glycol (SYSTANE BALANCE OP) Place 1 drop into both eyes 2 (two) times daily as needed (dry eyes).   . rivaroxaban (XARELTO) 20 MG TABS tablet Take 1 tablet (20 mg total) by mouth daily with supper. Dx: paroxysmal atrial fibrillation  . tamsulosin (FLOMAX) 0.4 MG CAPS capsule Take 0.4 mg by mouth every evening.   . triamcinolone cream (KENALOG) 0.1 % Apply 1 application topically daily as needed (itching).   . vitamin B-12 (CYANOCOBALAMIN) 1000 MCG tablet Take 1,000 mcg by mouth daily.       Observations/Objective: No distress. Unable to perform physical exam due to telephone encounter. No video available.   Assessment and Plan: 85 year old male with need for early interval surveillance colonoscopy due to multiple polyps but desires to wait a few months prior to  scheduling. Due to COVID-19, elective procedures on hold as well. Will have him return in June 2020 to arrange.   Follow Up Instructions: We will see you in June 2020 to arrange a colonoscopy! It is important that we pursue early follow-up with this due to the multiple polyps you had.     I discussed the assessment and treatment plan with the patient. The patient was provided an opportunity to ask questions and all were answered. The patient agreed with the plan and demonstrated an understanding of the instructions.   The patient was advised to call back or seek an in-person evaluation if the symptoms worsen or if the condition fails to improve as anticipated.  I provided 10 minutes of non-face-to-face time during this encounter.  Annitta Needs, PhD, ANP-BC [facility]

## 2018-06-21 ENCOUNTER — Encounter: Payer: Self-pay | Admitting: Gastroenterology

## 2018-06-21 NOTE — Progress Notes (Signed)
CC'D TO PCP °

## 2018-07-04 DIAGNOSIS — J189 Pneumonia, unspecified organism: Secondary | ICD-10-CM | POA: Diagnosis not present

## 2018-07-04 DIAGNOSIS — I2510 Atherosclerotic heart disease of native coronary artery without angina pectoris: Secondary | ICD-10-CM | POA: Diagnosis not present

## 2018-07-04 DIAGNOSIS — I5022 Chronic systolic (congestive) heart failure: Secondary | ICD-10-CM | POA: Diagnosis not present

## 2018-07-04 DIAGNOSIS — J301 Allergic rhinitis due to pollen: Secondary | ICD-10-CM | POA: Diagnosis not present

## 2018-07-19 ENCOUNTER — Ambulatory Visit (INDEPENDENT_AMBULATORY_CARE_PROVIDER_SITE_OTHER): Payer: Medicare Other | Admitting: *Deleted

## 2018-07-19 ENCOUNTER — Other Ambulatory Visit: Payer: Self-pay

## 2018-07-19 DIAGNOSIS — I442 Atrioventricular block, complete: Secondary | ICD-10-CM | POA: Diagnosis not present

## 2018-07-20 LAB — CUP PACEART REMOTE DEVICE CHECK
Date Time Interrogation Session: 20200520102832
Implantable Lead Implant Date: 20171109
Implantable Lead Implant Date: 20171109
Implantable Lead Implant Date: 20171109
Implantable Lead Location: 753858
Implantable Lead Location: 753859
Implantable Lead Location: 753860
Implantable Pulse Generator Implant Date: 20171109
Pulse Gen Model: 3262
Pulse Gen Serial Number: 7949810

## 2018-07-28 ENCOUNTER — Encounter: Payer: Self-pay | Admitting: Cardiology

## 2018-07-28 NOTE — Progress Notes (Signed)
Remote pacemaker transmission.   

## 2018-08-03 ENCOUNTER — Telehealth: Payer: Self-pay | Admitting: Cardiovascular Disease

## 2018-08-03 NOTE — Telephone Encounter (Signed)
Virtual Visit Pre-Appointment Phone Call  "(Name), I am calling you today to discuss your upcoming appointment. We are currently trying to limit exposure to the virus that causes COVID-19 by seeing patients at home rather than in the office."  1. "What is the BEST phone number to call the day of the visit?" - include this in appointment notes  2. Do you have or have access to (through a family member/friend) a smartphone with video capability that we can use for your visit?" a. If yes - list this number in appt notes as cell (if different from BEST phone #) and list the appointment type as a VIDEO visit in appointment notes b. If no - list the appointment type as a PHONE visit in appointment notes  3. Confirm consent - "In the setting of the current Covid19 crisis, you are scheduled for a (phone or video) visit with your provider on (date) at (time).  Just as we do with many in-office visits, in order for you to participate in this visit, we must obtain consent.  If you'd like, I can send this to your mychart (if signed up) or email for you to review.  Otherwise, I can obtain your verbal consent now.  All virtual visits are billed to your insurance company just like a normal visit would be.  By agreeing to a virtual visit, we'd like you to understand that the technology does not allow for your provider to perform an examination, and thus may limit your provider's ability to fully assess your condition. If your provider identifies any concerns that need to be evaluated in person, we will make arrangements to do so.  Finally, though the technology is pretty good, we cannot assure that it will always work on either your or our end, and in the setting of a video visit, we may have to convert it to a phone-only visit.  In either situation, we cannot ensure that we have a secure connection.  Are you willing to proceed?" STAFF: Did the patient verbally acknowledge consent to telehealth visit? Document  YES/NO here: Yes  4. Advise patient to be prepared - "Two hours prior to your appointment, go ahead and check your blood pressure, pulse, oxygen saturation, and your weight (if you have the equipment to check those) and write them all down. When your visit starts, your provider will ask you for this information. If you have an Apple Watch or Kardia device, please plan to have heart rate information ready on the day of your appointment. Please have a pen and paper handy nearby the day of the visit as well."  5. Give patient instructions for MyChart download to smartphone OR [email or web address] as below if video visit (depending on what platform provider is using)  6. Inform patient they will receive a phone call 15 minutes prior to their appointment time (may be from unknown caller ID) so they should be prepared to answer    TELEPHONE CALL NOTE  KEYONTE COOKSTON has been deemed a candidate for a follow-up tele-health visit to limit community exposure during the Covid-19 pandemic. I spoke with the patient via phone to ensure availability of phone/video source, confirm preferred email & phone number, and discuss instructions and expectations.  I reminded ZAEL SHUMAN to be prepared with any vital sign and/or heart rhythm information that could potentially be obtained via home monitoring, at the time of his visit. I reminded GIOMAR GUSLER to expect a phone call prior to  his visit.  Terry L Goins 08/03/2018 3:13 PM

## 2018-08-08 ENCOUNTER — Telehealth (INDEPENDENT_AMBULATORY_CARE_PROVIDER_SITE_OTHER): Payer: Medicare Other | Admitting: Cardiovascular Disease

## 2018-08-08 ENCOUNTER — Encounter: Payer: Self-pay | Admitting: Cardiovascular Disease

## 2018-08-08 VITALS — BP 145/75 | HR 101 | Ht 73.0 in | Wt 197.0 lb

## 2018-08-08 DIAGNOSIS — I442 Atrioventricular block, complete: Secondary | ICD-10-CM

## 2018-08-08 DIAGNOSIS — Z955 Presence of coronary angioplasty implant and graft: Secondary | ICD-10-CM

## 2018-08-08 DIAGNOSIS — I25118 Atherosclerotic heart disease of native coronary artery with other forms of angina pectoris: Secondary | ICD-10-CM

## 2018-08-08 DIAGNOSIS — R0602 Shortness of breath: Secondary | ICD-10-CM

## 2018-08-08 DIAGNOSIS — E785 Hyperlipidemia, unspecified: Secondary | ICD-10-CM

## 2018-08-08 DIAGNOSIS — Z950 Presence of cardiac pacemaker: Secondary | ICD-10-CM

## 2018-08-08 DIAGNOSIS — I10 Essential (primary) hypertension: Secondary | ICD-10-CM

## 2018-08-08 DIAGNOSIS — I480 Paroxysmal atrial fibrillation: Secondary | ICD-10-CM

## 2018-08-08 DIAGNOSIS — I252 Old myocardial infarction: Secondary | ICD-10-CM

## 2018-08-08 DIAGNOSIS — I5022 Chronic systolic (congestive) heart failure: Secondary | ICD-10-CM

## 2018-08-08 MED ORDER — METOPROLOL SUCCINATE ER 25 MG PO TB24: 25.0000 mg | ORAL_TABLET | Freq: Every day | ORAL | 3 refills | Status: DC

## 2018-08-08 NOTE — Progress Notes (Signed)
Virtual Visit via Telephone Note   This visit type was conducted due to national recommendations for restrictions regarding the COVID-19 Pandemic (e.g. social distancing) in an effort to limit this patient's exposure and mitigate transmission in our community.  Due to his co-morbid illnesses, this patient is at least at moderate risk for complications without adequate follow up.  This format is felt to be most appropriate for this patient at this time.  The patient did not have access to video technology/had technical difficulties with video requiring transitioning to audio format only (telephone).  All issues noted in this document were discussed and addressed.  No physical exam could be performed with this format.  Please refer to the patient's chart for his  consent to telehealth for [facility].   Date:  08/08/2018   ID:  Nicholas Sanchez, DOB [birth year], MRN [id number]  Patient Location: Home Provider Location: Home  PCP:  Sinda Du, MD  Cardiologist:  Kate Sable, MD  Electrophysiologist:  Cristopher Peru, MD   Evaluation Performed:  Follow-Up Visit  Chief Complaint:  CAD, PAF, CHF  History of Present Illness:    Nicholas Sanchez is a 85 y.o. male with a history of coronary artery disease with multivessel PCI.  Most recently, he had a non-STEMI in August 2019 and ultimately underwent PCI of the proximal and mid RCA with 2 drug-eluting stents. He also has paroxysmal atrial fibrillation.  He has an ischemic cardiomyopathy and sick sinus syndrome with a biventricular pacemaker and follows with Dr. Lovena Le for this.  He denies chest pain. He has been having some shortness of breath. He developed it initially after trying to kill a black snake a few days ago. He has some very mild bilateral ankle swelling only if he's up on his feet too long but says "they're not real bad".  He is no longer taking Toprol-XL for unclear reasons.  The patient does not have symptoms concerning  for COVID-19 infection (fever, chills, cough, or new shortness of breath).    Past Medical History:  Diagnosis Date  . CAD (coronary artery disease)    a. STEMI 04/2008 s/p BMS to prox & distal RCA, staged DES to LAD same admission. b. Nuc 03/2017: scar but no ischemia, EF 45-54%. c. 09/2017: NSTEMI - DES x 2 to proximal and mid-RCA  . CKD (chronic kidney disease), stage II   . Cough, persistent   . GERD (gastroesophageal reflux disease)   . History of BPH   . HTN (hypertension)   . Hypercholesteremia   . LV dysfunction    a. EF 45-50% in 01/2016.  . Mild pulmonary hypertension ([place])   . PAF (paroxysmal atrial fibrillation) ([place])   . Presence of permanent cardiac pacemaker   . Sinus node dysfunction ([place])    a. s/p StJude CRT-pacemaker 01/2016.   Past Surgical History:  Procedure Laterality Date  . CARDIAC CATHETERIZATION    . COLONOSCOPY    . COLONOSCOPY N/A 08/27/2017   Dr. [name]: 25 5-25 mm polyps in descending colon, ascending, cecum. 2X3 cm carpet polyp in base of cecum lifted away s/p piecemeal polypectomy. Tubulovillous adenoma and tubular adenomas  . CORONARY STENT INTERVENTION N/A 10/22/2017   Procedure: CORONARY STENT INTERVENTION;  Surgeon: [name], MD;  Location: [facility];  Service: Cardiovascular;  Laterality: N/A;  . CORONARY STENT PLACEMENT  04/2008   RCA and LAD  . EP IMPLANTABLE DEVICE N/A 01/09/2016   Procedure: BiV Pacemaker Insertion CRT-P;  Surgeon: Carleene Overlie  Peyton Najjar, MD;  Location: [facility];  Service: Cardiovascular;  Laterality: N/A;  . ESOPHAGOGASTRODUODENOSCOPY N/A 08/27/2017   normal esophagus s/p dilation, gastric petechia, small hiatal hernia, normal duodenum  . INSERT / REPLACE / REMOVE PACEMAKER    . MALONEY DILATION N/A 08/27/2017   Procedure: [name] DILATION;  Surgeon: [name], MD;  [facility];  Service: Endoscopy;  Laterality: N/A;  . RIGHT/LEFT HEART CATH AND CORONARY ANGIOGRAPHY N/A 10/21/2017    Procedure: RIGHT/LEFT HEART CATH AND CORONARY ANGIOGRAPHY;  Surgeon: [name], MD;  Location: [facility];  Service: Cardiovascular;  Laterality: N/A;     Current Meds  Medication Sig  . acetaminophen (TYLENOL) 500 MG tablet Take 500 mg by mouth every 6 (six) hours as needed for pain.  [name] aspirin EC 81 MG tablet Take 81 mg by mouth daily.  . cholecalciferol (VITAMIN D) 1000 units tablet Take 1,000 Units by mouth 2 (two) times daily.  . ferrous sulfate 325 (65 FE) MG tablet Take 325 mg by mouth 3 (three) times daily.  . hydrochlorothiazide (HYDRODIURIL) 25 MG tablet TAKE ONE (1) TABLET BY MOUTH EVERY DAY  . losartan (COZAAR) 25 MG tablet TAKE ONE TABLET BY MOUTY EVERY DAY  . lovastatin (MEVACOR) 10 MG tablet TAKE ONE (1) TABLET BY MOUTH EVERY DAY  . nitroGLYCERIN (NITROSTAT) 0.4 MG SL tablet Place 1 tablet (0.4 mg total) under the tongue every 5 (five) minutes x 3 doses as needed for chest pain.  . pantoprazole (PROTONIX) 40 MG tablet Take 40 mg by mouth daily.  [name] Propylene Glycol (SYSTANE BALANCE OP) Place 1 drop into both eyes 2 (two) times daily as needed (dry eyes).   . rivaroxaban (XARELTO) 20 MG TABS tablet Take 1 tablet (20 mg total) by mouth daily with supper. Dx: paroxysmal atrial fibrillation  . tamsulosin (FLOMAX) 0.4 MG CAPS capsule Take 0.4 mg by mouth every evening.   . triamcinolone cream (KENALOG) 0.1 % Apply 1 application topically daily as needed (itching).   . vitamin B-12 (CYANOCOBALAMIN) 1000 MCG tablet Take 1,000 mcg by mouth daily.     Allergies:   Ace inhibitors and Tape   Social History   Tobacco Use  . Smoking status: Former Smoker    Packs/day: 2.50    Years: 25.00    Pack years: 62.50    Types: Cigarettes    Last attempt to quit: 06/14/1973    Years since quitting: 45.1  . Smokeless tobacco: Former Systems developer    Types: Chew    Quit date: 06/14/1973  . Tobacco comment: some/chewed very little for about 2 years  Substance Use Topics  . Alcohol use:  No  . Drug use: No     Family Hx: The patient's family history includes Cancer in his mother; Emphysema in his mother. There is no history of Colon cancer or Colon polyps.  ROS:   Please see the history of present illness.     All other systems reviewed and are negative.   Prior CV studies:   The following studies were reviewed today:  Diagnostic coronary angiography (10/21/17):   Diffuse in-stent restenosis within the ostial to proximal RCA stent.  High-grade mid RCA stenosis within a region of tortuosity.  Distal RCA is large.  Normal left main.  Large widely patent LAD including proximal stent.  Large normal ramus  Relatively small normal circumflex.  LVEF estimated 30 to 35%.  LVEDP 17 mmHg  RECOMMENDATIONS:   Given reduced  LV function, anterior origin of the right coronary from the right sinus of Valsalva, diffuse calcified in-stent restenosis in the ostial to proximal RCA, and high-grade potentially calcified stenosis in the mid vessel within angulated segment, PCI will be high risk.  Recommend femoral approach.  Discussed with Dr. Burt Knack.  Loaded with Plavix.  Patient is on chronic anticoagulation therapy.  Cath 10/22/17 (PCI):  Successful orbital atherectomy, noncompliant balloon angioplasty, Cutting Balloon angioplasty, and stenting of severe stenosis in the mid RCA and severe in-stent restenosis in the proximal and ostial RCA using 2 DES platforms (3.5x15 mm Orsiro in the mid vessel and 3.5x18 mm Orsiro in the proximal vessel)  Recommend to resume Rivaroxaban, at currently prescribed dose and frequency, on 10/23/2017.  Recommend concurrent antiplatelet therapy of Aspirin 81mg  daily for 1 month and Clopidogrel 75mg  daily for 6 months.  Labs/Other Tests and Data Reviewed:    EKG:  No ECG reviewed.  Recent Labs: 10/20/2017: ALT 13 11/12/2017: BUN 13; Creatinine, Ser 1.14; Hemoglobin 13.0; Platelets 137; Potassium 3.6; Sodium 132   Recent Lipid Panel Lab  Results  Component Value Date/Time   CHOL 104 03/01/2017 10:04 AM   TRIG 54 03/01/2017 10:04 AM   HDL 33 (L) 03/01/2017 10:04 AM   CHOLHDL 3.2 03/01/2017 10:04 AM   LDLCALC 58 03/01/2017 10:04 AM    Wt Readings from Last 3 Encounters:  08/08/18 197 lb (89.4 kg)  02/11/18 202 lb (91.6 kg)  02/03/18 204 lb 6.4 oz (92.7 kg)     Objective:    Vital Signs:  BP (!) 145/75   Pulse (!) 101   Ht 6\' 1"  (1.854 m)   Wt 197 lb (89.4 kg)   BMI 25.99 kg/m    VITAL SIGNS:  reviewed  ASSESSMENT & PLAN:    1.  Coronary artery disease: Symptomatically stable.  He has a history of multivessel PCI with most recent intervention in August 2019 for a non-STEMI with 2 drug-eluting stents placed to the proximal and mid RCA.   Continue ASA 81 mg. I will resume metoprolol succinate. Continue lovastatin.  2.  Ischemic cardiomyopathy/chronic systolic heart failure: LVEF 40 to 45% by echocardiogram in August 2019.  NYHA class II symptoms.  Continue losartan and HCTZ 25 mg daily.  I will resume metoprolol succinate.   3.  Sick sinus syndrome: Biventricular pacemaker in November 2017.  Normal device function noted recently.  Follows with Dr. Lovena Le.  4.  Paroxysmal atrial fibrillation: No longer taking Toprol-XL 25 mg daily.  He remains on Xarelto for systemic anticoagulation. HR elevated and he has had some shortness of breath.  I will resume metoprolol succinate.   5.  Hypertension: Blood pressure is mildly elevated.   I will resume metoprolol succinate.   6.  Hyperlipidemia: Continue lovastatin 10 mg.   COVID-19 Education: The signs and symptoms of COVID-19 were discussed with the patient and how to seek care for testing (follow up with PCP or arrange E-visit).  The importance of social distancing was discussed today.  Time:   Today, I have spent 15 minutes with the patient with telehealth technology discussing the above problems.     Medication Adjustments/Labs and Tests Ordered: Current  medicines are reviewed at length with the patient today.  Concerns regarding medicines are outlined above.   Tests Ordered: No orders of the defined types were placed in this encounter.   Medication Changes: No orders of the defined types were placed in this encounter.   Disposition:  Follow up  in 6 month(s)  Signed, Kate Sable, MD  08/08/2018 10:37 AM    [place]

## 2018-08-08 NOTE — Addendum Note (Signed)
Addended by: Barbarann Ehlers A on: 08/08/2018 10:56 AM   Modules accepted: Orders

## 2018-08-08 NOTE — Patient Instructions (Signed)
Medication Instructions:  Please take Toprol XL 25 mg daily  All other medications stay the same  Labwork: none  Procedures/Testing: none  Follow-Up: 6  Months with Dr.Koneswaran  Any Additional Special Instructions Will Be Listed Below (If Applicable).     If you need a refill on your cardiac medications before your next appointment, please call your pharmacy.  .    Thank you for choosing [facility] !

## 2018-08-30 ENCOUNTER — Other Ambulatory Visit: Payer: Self-pay

## 2018-09-01 ENCOUNTER — Encounter: Payer: Self-pay | Admitting: *Deleted

## 2018-09-01 ENCOUNTER — Other Ambulatory Visit: Payer: Self-pay | Admitting: *Deleted

## 2018-09-01 NOTE — Patient Outreach (Signed)
[facility]) Care Management  09/01/2018  Nicholas Sanchez [birth year] [id number]   Telephone Screen  Referral Date: 08/30/18 Referral Source: EMMI prevent Score of 8   Insurance: veteran's administration No admissions nor ED visits in the last 6 months   Outreach attempt # 1 successful at the home number  Patient is able to verify HIPAA Reviewed and addressed referral to Nicholas Sanchez with patient  During the call Nicholas Sanchez voiced that he felt he was doing well and was managing well at home  He did voice that he has had ongoing coughing (at times with noted dime size blood in sputum with coughing spells) since his MI in 2010. He reports a recent tx for PNA and a hx of dysphagia with an endoscopy. He is noted with a change in speech tone during the call He also pointed out he develops hoarseness with increase speech.  He also states he has noted some dizziness since his last MI in August 2019 with instability in mobility He confirms use of his cane without falls. CM discussed checking his BP prior to taking HTN medication to prevent hypotension that may lead to mobility and fall concerns He voiced understanding He states he will begin to monitor BP and discuss with Nicholas Sanchez to see if adjustments may be needed related to HTN medications. He voices concern also "I am on too many medications that may cause dizziness"   [facility] RN CM assessed for s/s of CHF and discussed which s/s to call his MD for. He does reports some swelling of ankles He reports a wt on his scales at home of 197 lbs but at [facility] MD office he was 206 lbs  [facility] RN CM cautioned him about a wt gain of more than 3-5 lbs related to CHF. He informed CM he has an upcoming appointment with Nicholas Sanchez, had not been weighing as frequently as he should and would start checking his weight to monitor changes.   [facility] RN CM discussed and offered [facility] community RN CM and [facility] health coach services related to CHF, HTN but he refused the offer  and states he prefers to be seen by Nicholas Sanchez first before making a decision  He was encouraged to return a call to [facility] if needed  [facility] RN CM discussed sending this note to Nicholas Sanchez and Nicholas Sanchez   Social: Nicholas Sanchez lives at home with his wife, Nicholas Sanchez and also has support of 3 daughter He is on SSI and reports some issues with still paying off his August 2019 hospital bill. He reports he is independent with his ADLs and independent/assist with some iADLs Nicholas Sanchez assists  No issues reported with transportation to medical appointments  Conditions: HTN, HF, CAD, heart block, paroxysmal atrial fibrillation, hx of angina,  Hx of NSTEMI GERD, dysphagia, HLD, Iron deficiency anemia, hx of adenomatous colonic polyps  Falls None  DME: Cane, BP cuff   Medications:  He denies concerns with taking medications as prescribed, affording medications, side effects of medications and questions about medications  He reports he generally gets his flu shot annually at the veteran's administration but received it 11/19/18 locally He hs received a shingles and pneumovax from the veteran's administration but can not recall the dates at this time   Appointments: pcp seen last 07/29/18 by primary MD He is seen at the [facility] administration and also by Nicholas Sanchez  To be seen by Nicholas Sanchez he believes on next Tuesday September 06 2018 09/14/18  0930 Nicholas Sanchez, gastroenterologist 10/18/18  0740 cardiac device to be checked  Advance Directives: Denies need for assist with advance directives   Consent: [facility] RN CM reviewed [facility] services with patient. Patient gave verbal consent for services [name] telephonic RN CM.   Plan: [facility] RN CM will close case at this time as patient has been assessed and no needs identified/needs resolved.   Pt encouraged to return a call to [place] CM prn  [facility] RN CM sent a successful outreach letter as discussed with [facility] brochure enclosed for review    Nicholas Fidalgo L. Lavina Hamman, RN, BSN,  [place] Coordinator Office number [phone] Mobile number [phone]  Main [facility] number [phone] Fax number [phone]

## 2018-09-06 ENCOUNTER — Ambulatory Visit (HOSPITAL_COMMUNITY)
Admission: RE | Admit: 2018-09-06 | Discharge: 2018-09-06 | Disposition: A | Discharge status: Home / Self Care | Payer: Medicare Other | Source: Ambulatory Visit | Attending: Pulmonary Disease | Admitting: Pulmonary Disease

## 2018-09-06 ENCOUNTER — Other Ambulatory Visit: Payer: Self-pay

## 2018-09-06 ENCOUNTER — Other Ambulatory Visit (HOSPITAL_COMMUNITY): Payer: Self-pay | Admitting: Pulmonary Disease

## 2018-09-06 DIAGNOSIS — I10 Essential (primary) hypertension: Secondary | ICD-10-CM | POA: Diagnosis not present

## 2018-09-06 DIAGNOSIS — J189 Pneumonia, unspecified organism: Secondary | ICD-10-CM

## 2018-09-06 DIAGNOSIS — I25700 Atherosclerosis of coronary artery bypass graft(s), unspecified, with unstable angina pectoris: Secondary | ICD-10-CM | POA: Diagnosis not present

## 2018-09-06 DIAGNOSIS — I25709 Atherosclerosis of coronary artery bypass graft(s), unspecified, with unspecified angina pectoris: Secondary | ICD-10-CM | POA: Diagnosis not present

## 2018-09-06 DIAGNOSIS — R05 Cough: Secondary | ICD-10-CM | POA: Diagnosis not present

## 2018-09-06 DIAGNOSIS — I480 Paroxysmal atrial fibrillation: Secondary | ICD-10-CM | POA: Diagnosis not present

## 2018-09-06 LAB — BASIC METABOLIC PANEL
BUN: 17 (ref 4–21)
Creatinine: 1.2 (ref 0.6–1.3)
Glucose: 88

## 2018-09-06 LAB — CBC AND DIFFERENTIAL: WBC: 8

## 2018-09-07 ENCOUNTER — Other Ambulatory Visit (HOSPITAL_COMMUNITY): Payer: Self-pay | Admitting: Pulmonary Disease

## 2018-09-07 ENCOUNTER — Other Ambulatory Visit: Payer: Self-pay | Admitting: Pulmonary Disease

## 2018-09-07 DIAGNOSIS — R9389 Abnormal findings on diagnostic imaging of other specified body structures: Secondary | ICD-10-CM

## 2018-09-14 ENCOUNTER — Ambulatory Visit (INDEPENDENT_AMBULATORY_CARE_PROVIDER_SITE_OTHER): Payer: Medicare Other | Admitting: Gastroenterology

## 2018-09-14 ENCOUNTER — Encounter: Payer: Self-pay | Admitting: Gastroenterology

## 2018-09-14 ENCOUNTER — Other Ambulatory Visit: Payer: Self-pay

## 2018-09-14 VITALS — BP 131/75 | HR 86 | Temp 97.0°F | Ht 73.0 in | Wt 202.2 lb

## 2018-09-14 DIAGNOSIS — K219 Gastro-esophageal reflux disease without esophagitis: Secondary | ICD-10-CM

## 2018-09-14 DIAGNOSIS — Z86010 Personal history of colonic polyps: Secondary | ICD-10-CM | POA: Diagnosis not present

## 2018-09-14 NOTE — Progress Notes (Signed)
Referring Provider: Sinda Du, MD Primary Care Physician:  Sinda Du, MD  Primary GI: Dr. [name]   Chief Complaint  Patient presents with  . history of polyps    HPI:   Nicholas Sanchez is a 85 y.o. male presenting today with a history of  IDA in setting of Xarelto, s/p EGD and colonoscopy June 2019. EGD with normal esophagus s/p dilation, gastric petechia, normal duodenum. Colonoscopy with 25 five to 25 mm polyps in descending colon, ascending, cecum. 2X3 cm carpet polyp in base of cecum lifted away s/p piecemeal polypectomy. Tubulovillous adenomas and tubular adenomas.6 month surveillance recommended (Dec 2019); however, this has been postponed due to NSTEMI Aug 2019 and stenting X 2 with DES.   Recently treated for pneumonia. CT chest coming up next week due to persistent infiltrate in right upper lobe. Feeling short of breath. Decreased appetite. Gets full quicker.   No reflux, dysphagia, N/V. Protonix once daily. Constipation at times in setting of iron. Not often. Will eat a few bowls of cereal and have good BM. No overt GI bleeding. Spitting up blood. Dr. Luan Pulling aware.   He is wanting to postpone any colonoscopy currently until other health issues are addressed.   Past Medical History:  Diagnosis Date  . CAD (coronary artery disease)    a. STEMI 04/2008 s/p BMS to prox & distal RCA, staged DES to LAD same admission. b. Nuc 03/2017: scar but no ischemia, EF 45-54%. c. 09/2017: NSTEMI - DES x 2 to proximal and mid-RCA  . CKD (chronic kidney disease), stage II   . Cough, persistent   . GERD (gastroesophageal reflux disease)   . History of BPH   . HTN (hypertension)   . Hypercholesteremia   . LV dysfunction    a. EF 45-50% in 01/2016.  . Mild pulmonary hypertension ([place])   . PAF (paroxysmal atrial fibrillation) ([place])   . Presence of permanent cardiac pacemaker   . Sinus node dysfunction ([place])    a. s/p StJude CRT-pacemaker 01/2016.    Past Surgical History:   Procedure Laterality Date  . CARDIAC CATHETERIZATION    . COLONOSCOPY    . COLONOSCOPY N/A 08/27/2017   Dr. [name]: 25 5-25 mm polyps in descending colon, ascending, cecum. 2X3 cm carpet polyp in base of cecum lifted away s/p piecemeal polypectomy. Tubulovillous adenoma and tubular adenomas  . CORONARY STENT INTERVENTION N/A 10/22/2017   Procedure: CORONARY STENT INTERVENTION;  Surgeon: [name], MD;  Location: [place] [facility];  Service: Cardiovascular;  Laterality: N/A;  . CORONARY STENT PLACEMENT  04/2008   RCA and LAD  . EP IMPLANTABLE DEVICE N/A 01/09/2016   Procedure: BiV Pacemaker Insertion CRT-P;  Surgeon: [name], MD;  Location: [place] [facility];  Service: Cardiovascular;  Laterality: N/A;  . ESOPHAGOGASTRODUODENOSCOPY N/A 08/27/2017   normal esophagus s/p dilation, gastric petechia, small hiatal hernia, normal duodenum  . INSERT / REPLACE / REMOVE PACEMAKER    . MALONEY DILATION N/A 08/27/2017   Procedure: [name] DILATION;  Surgeon: [name], MD;  [facility];  Service: Endoscopy;  Laterality: N/A;  . RIGHT/LEFT HEART CATH AND CORONARY ANGIOGRAPHY N/A 10/21/2017   Procedure: RIGHT/LEFT HEART CATH AND CORONARY ANGIOGRAPHY;  Surgeon: [name], MD;  Location: [place] [facility];  Service: Cardiovascular;  Laterality: N/A;    Current Outpatient Medications  Medication Sig Dispense Refill  . acetaminophen (TYLENOL) 500 MG tablet Take 500 mg by mouth every 6 (six) hours  as needed for pain.    [name] aspirin EC 81 MG tablet Take 81 mg by mouth daily.    . cholecalciferol (VITAMIN D) 1000 units tablet Take 1,000 Units by mouth 2 (two) times daily.    . ferrous sulfate 325 (65 FE) MG tablet Take 325 mg by mouth 3 (three) times daily.    . hydrochlorothiazide (HYDRODIURIL) 25 MG tablet TAKE ONE (1) TABLET BY MOUTH EVERY DAY 90 tablet 3  . losartan (COZAAR) 25 MG tablet TAKE ONE TABLET BY MOUTY EVERY DAY 90 tablet 1  . lovastatin (MEVACOR) 10 MG tablet TAKE  ONE (1) TABLET BY MOUTH EVERY DAY 90 tablet 3  . metoprolol succinate (TOPROL XL) 25 MG 24 hr tablet Take 1 tablet (25 mg total) by mouth daily. 90 tablet 3  . nitroGLYCERIN (NITROSTAT) 0.4 MG SL tablet Place 1 tablet (0.4 mg total) under the tongue every 5 (five) minutes x 3 doses as needed for chest pain. 25 tablet 2  . pantoprazole (PROTONIX) 40 MG tablet Take 40 mg by mouth daily.    [name] Propylene Glycol (SYSTANE BALANCE OP) Place 1 drop into both eyes 2 (two) times daily as needed (dry eyes).     . rivaroxaban (XARELTO) 20 MG TABS tablet Take 1 tablet (20 mg total) by mouth daily with supper. Dx: paroxysmal atrial fibrillation 90 tablet 3  . triamcinolone cream (KENALOG) 0.1 % Apply 1 application topically daily as needed (itching).     . vitamin B-12 (CYANOCOBALAMIN) 1000 MCG tablet Take 1,000 mcg by mouth daily.     No current facility-administered medications for this visit.     Allergies as of 09/14/2018 - Review Complete 09/14/2018  Allergen Reaction Noted  . Ace inhibitors Nausea Only 07/18/2010  . Tape Rash 01/08/2016    Family History  Problem Relation Age of Onset  . Emphysema Mother        mother died with emphysema and cancer  . Cancer Mother   . Colon cancer Neg Hx   . Colon polyps Neg Hx     Social History   Socioeconomic History  . Marital status: Married    Spouse name: Air traffic controller  . Number of children: 3  . Years of education: Not on file  . Highest education level: Not on file  Occupational History  . Occupation: retired    Comment: Geographical information systems officer  . Financial resource strain: Not very hard  . Food insecurity    Worry: Never true    Inability: Never true  . Transportation needs    Medical: No    Non-medical: No  Tobacco Use  . Smoking status: Former Smoker    Packs/day: 2.50    Years: 25.00    Pack years: 62.50    Types: Cigarettes    Quit date: 06/14/1973    Years since quitting: 45.2  . Smokeless tobacco: Former Systems developer    Types: Chew    Quit  date: 06/14/1973  . Tobacco comment: some/chewed very little for about 2 years  Substance and Sexual Activity  . Alcohol use: No  . Drug use: No  . Sexual activity: Not on file  Lifestyle  . Physical activity    Days per week: 0 days    Minutes per session: 0 min  . Stress: Not at all  Relationships  . Social Herbalist on phone: Not on file    Gets together: Not on file    Attends religious service: Not on  file    Active member of club or organization: Not on file    Attends meetings of clubs or organizations: Not on file    Relationship status: Not on file  Other Topics Concern  . 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     Review of Systems: Gen: Denies fever, chills, anorexia. Denies fatigue, weakness, weight loss.  CV: Denies chest pain, palpitations, syncope, peripheral edema, and claudication. Resp: +DOE GI: see HPI Derm: Denies rash, itching, dry skin Psych: Denies depression, anxiety, memory loss, confusion. No homicidal or suicidal ideation.  Heme: Denies bruising, bleeding, and enlarged lymph nodes.  Physical Exam: BP 131/75   Pulse 86   Temp (!) 97 F (36.1 C) (Oral)   Ht 6\' 1"  (1.854 m)   Wt 202 lb 3.2 oz (91.7 kg)   BMI 26.68 kg/m  General:   Alert and oriented. No distress noted. Pleasant and cooperative.  Head:  Normocephalic and atraumatic. Eyes:  Conjuctiva clear without scleral icterus. Mouth:  Oral mucosa pink and moist.  Abdomen:  +BS, soft, non-tender and non-distended. No rebound or guarding. No HSM or masses noted. Msk:  Mild kyphosis  Extremities:  Without edema. Neurologic:  Alert and  oriented x4 Psych:  Alert and cooperative. Normal mood and affect.

## 2018-09-14 NOTE — Progress Notes (Signed)
CC'ED TO PCP 

## 2018-09-14 NOTE — Assessment & Plan Note (Addendum)
Numerous polyps with an advanced polyp in June 2019, with needed surveillance. However, he has had NSTEMI Aug 2019, stenting, now with pneumonia and abnormal CXR requiring CT. Worsening SOB and DOE, with PCP aware. Requesting to hold on early interval surveillance, which at this time is appropriate as he would not be an ideal candidate for sedation. He would like to return in 6 months to further discuss. 6 month follow-up.

## 2018-09-14 NOTE — Patient Instructions (Signed)
We will see you in 6 months!  Please call if you have any questions or concerns!  I enjoyed seeing you again today! As you know, I value our relationship and want to provide genuine, compassionate, and quality care. I welcome your feedback. If you receive a survey regarding your visit,  I greatly appreciate you taking time to fill this out. See you next time!  Annitta Needs, PhD, ANP-BC [facility]

## 2018-09-14 NOTE — Assessment & Plan Note (Signed)
Controlled with Protonix daily.

## 2018-09-27 ENCOUNTER — Other Ambulatory Visit: Payer: Self-pay

## 2018-09-27 ENCOUNTER — Ambulatory Visit (HOSPITAL_COMMUNITY)
Admission: RE | Admit: 2018-09-27 | Discharge: 2018-09-27 | Disposition: A | Discharge status: Home / Self Care | Payer: Medicare Other | Source: Ambulatory Visit | Attending: Pulmonary Disease | Admitting: Pulmonary Disease

## 2018-09-27 DIAGNOSIS — J432 Centrilobular emphysema: Secondary | ICD-10-CM | POA: Diagnosis not present

## 2018-09-27 DIAGNOSIS — R9389 Abnormal findings on diagnostic imaging of other specified body structures: Secondary | ICD-10-CM | POA: Insufficient documentation

## 2018-09-27 DIAGNOSIS — R911 Solitary pulmonary nodule: Secondary | ICD-10-CM | POA: Diagnosis not present

## 2018-09-27 DIAGNOSIS — I517 Cardiomegaly: Secondary | ICD-10-CM | POA: Diagnosis not present

## 2018-09-27 LAB — POCT I-STAT CREATININE: Creatinine, Ser: 1.2 mg/dL (ref 0.61–1.24)

## 2018-09-27 MED ORDER — IOHEXOL 300 MG/ML  SOLN
75.0000 mL | Freq: Once | INTRAMUSCULAR | Status: AC | PRN
  Administered 2018-09-27: 11:00:00 75 mL via INTRAVENOUS

## 2018-09-29 ENCOUNTER — Other Ambulatory Visit (HOSPITAL_COMMUNITY): Payer: Self-pay | Admitting: Pulmonary Disease

## 2018-09-29 DIAGNOSIS — R9389 Abnormal findings on diagnostic imaging of other specified body structures: Secondary | ICD-10-CM

## 2018-09-29 DIAGNOSIS — R911 Solitary pulmonary nodule: Secondary | ICD-10-CM

## 2018-10-17 ENCOUNTER — Ambulatory Visit (HOSPITAL_COMMUNITY)
Admission: RE | Admit: 2018-10-17 | Discharge: 2018-10-17 | Disposition: A | Discharge status: Home / Self Care | Payer: Medicare Other | Source: Ambulatory Visit | Attending: Pulmonary Disease | Admitting: Pulmonary Disease

## 2018-10-17 ENCOUNTER — Other Ambulatory Visit: Payer: Self-pay

## 2018-10-17 DIAGNOSIS — R9389 Abnormal findings on diagnostic imaging of other specified body structures: Secondary | ICD-10-CM | POA: Insufficient documentation

## 2018-10-17 DIAGNOSIS — R911 Solitary pulmonary nodule: Secondary | ICD-10-CM | POA: Diagnosis not present

## 2018-10-17 DIAGNOSIS — R918 Other nonspecific abnormal finding of lung field: Secondary | ICD-10-CM | POA: Diagnosis not present

## 2018-10-17 MED ORDER — FLUDEOXYGLUCOSE F - 18 (FDG) INJECTION
11.0700 | Freq: Once | INTRAVENOUS | Status: AC | PRN
  Administered 2018-10-17: 11.07 via INTRAVENOUS

## 2018-10-18 ENCOUNTER — Ambulatory Visit (INDEPENDENT_AMBULATORY_CARE_PROVIDER_SITE_OTHER): Payer: Medicare Other | Admitting: *Deleted

## 2018-10-18 DIAGNOSIS — I442 Atrioventricular block, complete: Secondary | ICD-10-CM

## 2018-10-18 DIAGNOSIS — I482 Chronic atrial fibrillation, unspecified: Secondary | ICD-10-CM | POA: Diagnosis not present

## 2018-10-18 DIAGNOSIS — C3490 Malignant neoplasm of unspecified part of unspecified bronchus or lung: Secondary | ICD-10-CM | POA: Diagnosis not present

## 2018-10-18 DIAGNOSIS — J449 Chronic obstructive pulmonary disease, unspecified: Secondary | ICD-10-CM | POA: Diagnosis not present

## 2018-10-18 DIAGNOSIS — I2510 Atherosclerotic heart disease of native coronary artery without angina pectoris: Secondary | ICD-10-CM | POA: Diagnosis not present

## 2018-10-19 ENCOUNTER — Other Ambulatory Visit (HOSPITAL_COMMUNITY): Payer: Self-pay | Admitting: Pulmonary Disease

## 2018-10-19 DIAGNOSIS — R918 Other nonspecific abnormal finding of lung field: Secondary | ICD-10-CM

## 2018-10-20 LAB — CUP PACEART REMOTE DEVICE CHECK
Battery Remaining Longevity: 87 mo
Battery Remaining Percentage: 95.5 %
Battery Voltage: 2.98 V
Brady Statistic AP VP Percent: 96 %
Brady Statistic AP VS Percent: 1 %
Brady Statistic AS VP Percent: 3.9 %
Brady Statistic AS VS Percent: 1 %
Brady Statistic RA Percent Paced: 53 %
Brady Statistic RV Percent Paced: 99 %
Date Time Interrogation Session: 20200820152649
Implantable Lead Implant Date: 20171109
Implantable Lead Implant Date: 20171109
Implantable Lead Implant Date: 20171109
Implantable Lead Location: 753858
Implantable Lead Location: 753859
Implantable Lead Location: 753860
Implantable Pulse Generator Implant Date: 20171109
Lead Channel Impedance Value: 460 Ohm
Lead Channel Impedance Value: 560 Ohm
Lead Channel Impedance Value: 660 Ohm
Lead Channel Sensing Intrinsic Amplitude: 2.5 mV
Lead Channel Setting Pacing Amplitude: 2 V
Lead Channel Setting Pacing Amplitude: 2 V
Lead Channel Setting Pacing Amplitude: 2.5 V
Lead Channel Setting Pacing Pulse Width: 0.5 ms
Lead Channel Setting Pacing Pulse Width: 0.8 ms
Lead Channel Setting Sensing Sensitivity: 4 mV
Pulse Gen Model: 3262
Pulse Gen Serial Number: 7949810

## 2018-10-24 ENCOUNTER — Institutional Professional Consult (permissible substitution) (INDEPENDENT_AMBULATORY_CARE_PROVIDER_SITE_OTHER): Payer: Medicare Other | Admitting: Cardiothoracic Surgery

## 2018-10-24 ENCOUNTER — Other Ambulatory Visit: Payer: Self-pay

## 2018-10-24 ENCOUNTER — Other Ambulatory Visit: Payer: Self-pay | Admitting: Cardiothoracic Surgery

## 2018-10-24 ENCOUNTER — Encounter: Payer: Medicare Other | Admitting: Cardiothoracic Surgery

## 2018-10-24 VITALS — BP 135/76 | HR 76 | Temp 97.7°F | Resp 20 | Ht 73.0 in | Wt 201.0 lb

## 2018-10-24 DIAGNOSIS — R911 Solitary pulmonary nodule: Secondary | ICD-10-CM

## 2018-10-24 NOTE — Progress Notes (Signed)
[address]       [place] [address]             [phone]                    Nicholas Sanchez Yeager Medical Record #[id number] Date of Birth: Jun 24, 1933  Referring: Sinda Du, MD Primary Care: Sinda Du, MD Primary Cardiologist: Kate Sable, MD  Chief Complaint:    Chief Complaint  Patient presents with   Lung Lesion    Surgical eval, PET Scan 10/18/18, Chest CT 09/28/18    History of Present Illness:    Nicholas Sanchez 85 y.o. male is seen in the office  today for left lung mass.  The patient presented several months ago with increasing cough with minor hemoptysis.  A course of antibiotic did not improve the situation.  Subsequent CT scan and PET scan suggests stage IIIb lung cancer but without tissue diagnosis.  The patient is a previous smoker smoked heavily from age [age] but quit in 1974 has not smoked since.  He worked primarily in Scientist, research (medical) at [employer] and built never had any history of occupational exposure to asbestos.   Patient has history of coronary artery disease with multivessel PCI.  Most recently, he had a non-STEMI in August 2019 and ultimately underwent PCI of the proximal and mid RCA with 2 drug-eluting stents.  He has an ischemic cardiomyopathy and sick sinus syndrome with a biventricular pacemaker and follows with Dr. Lovena Le  Currently the patient is on aspirin and Xarelto   Current Activity/ Functional Status:  Patient is independent with mobility/ambulation, transfers, ADL's, IADL's.  Does use a walker and is unsteady on his feet at times.   Zubrod Score: At the time of surgery this patients most appropriate activity status/level should be described as: []     0    Normal activity, no symptoms []     1    Restricted in physical strenuous activity but ambulatory, able to do out light work [x]     2    Ambulatory and capable of self care, unable to do work activities, up and about               >50 % of waking  hours                              []     3    Only limited self care, in bed greater than 50% of waking hours []     4    Completely disabled, no self care, confined to bed or chair []     5    Moribund   Past Medical History:  Diagnosis Date   CAD (coronary artery disease)    a. STEMI 04/2008 s/p BMS to prox & distal RCA, staged DES to LAD same admission. b. Nuc 03/2017: scar but no ischemia, EF 45-54%. c. 09/2017: NSTEMI - DES x 2 to proximal and mid-RCA   CKD (chronic kidney disease), stage II    Cough, persistent    GERD (gastroesophageal reflux disease)    History of BPH    HTN (hypertension)    Hypercholesteremia    LV dysfunction    a. EF 45-50% in 01/2016.   Mild pulmonary hypertension (HCC)    PAF (paroxysmal atrial fibrillation) (HCC)    Presence of permanent cardiac pacemaker    Sinus node dysfunction (HCC)  a. s/p StJude CRT-pacemaker 01/2016.    Past Surgical History:  Procedure Laterality Date   CARDIAC CATHETERIZATION     COLONOSCOPY     COLONOSCOPY N/A 08/27/2017   Dr. [name]: 25 5-25 mm polyps in descending colon, ascending, cecum. 2X3 cm carpet polyp in base of cecum lifted away s/p piecemeal polypectomy. Tubulovillous adenoma and tubular adenomas   CORONARY STENT INTERVENTION N/A 10/22/2017   Procedure: CORONARY STENT INTERVENTION;  Surgeon: [name], MD;  Location: [facility];  Service: Cardiovascular;  Laterality: N/A;   CORONARY STENT PLACEMENT  04/2008   RCA and LAD   EP IMPLANTABLE DEVICE N/A 01/09/2016   Procedure: BiV Pacemaker Insertion CRT-P;  Surgeon: [name], MD;  Location: [facility];  Service: Cardiovascular;  Laterality: N/A;   ESOPHAGOGASTRODUODENOSCOPY N/A 08/27/2017   normal esophagus s/p dilation, gastric petechia, small hiatal hernia, normal duodenum   INSERT / REPLACE / REMOVE PACEMAKER     MALONEY DILATION N/A 08/27/2017   Procedure: MALONEY DILATION;  Surgeon: [name], MD;  [facility];  Service: Endoscopy;  Laterality: N/A;   RIGHT/LEFT HEART CATH AND CORONARY ANGIOGRAPHY N/A 10/21/2017   Procedure: RIGHT/LEFT HEART CATH AND CORONARY ANGIOGRAPHY;  Surgeon: [name], MD;  Location: [facility];  Service: Cardiovascular;  Laterality: N/A;    Family History  Problem Relation Age of Onset   Emphysema Mother        mother died with emphysema and cancer   Cancer Mother    Colon cancer Neg Hx    Colon polyps Neg Hx      Social History   Tobacco Use  Smoking Status Former Smoker   Packs/day: 2.50   Years: 25.00   Pack years: 62.50   Types: Cigarettes   Quit date: 06/14/1973   Years since quitting: 45.3  Smokeless Tobacco Former User   Types: Chew   Quit date: 06/14/1973  Tobacco Comment   some/chewed very little for about 2 years    Social History   Substance and Sexual Activity  Alcohol Use No     Allergies  Allergen Reactions   Ace Inhibitors Nausea Only   Tape Rash    And Bandaids, too    Current Outpatient Medications  Medication Sig Dispense Refill   acetaminophen (TYLENOL) 500 MG tablet Take 500 mg by mouth every 6 (six) hours as needed for pain.     aspirin EC 81 MG tablet Take 81 mg by mouth daily.     cholecalciferol (VITAMIN D) 1000 units tablet Take 1,000 Units by mouth 2 (two) times daily.     ferrous sulfate 325 (65 FE) MG tablet Take 325 mg by mouth 3 (three) times daily.     hydrochlorothiazide (HYDRODIURIL) 25 MG tablet TAKE ONE (1) TABLET BY MOUTH EVERY DAY 90 tablet 3   losartan (COZAAR) 25 MG tablet TAKE ONE TABLET BY MOUTY EVERY DAY 90 tablet 1   lovastatin (MEVACOR) 10 MG tablet TAKE ONE (1) TABLET BY MOUTH EVERY DAY 90 tablet 3   metoprolol succinate (TOPROL XL) 25 MG 24 hr tablet Take 1 tablet (25 mg total) by mouth daily. 90 tablet 3   nitroGLYCERIN (NITROSTAT) 0.4 MG SL tablet Place 1 tablet (0.4 mg total) under the tongue every 5 (five) minutes x 3 doses as needed for chest  pain. 25 tablet 2   pantoprazole (PROTONIX) 40 MG tablet Take 40 mg by mouth daily.     Propylene Glycol (  SYSTANE BALANCE OP) Place 1 drop into both eyes 2 (two) times daily as needed (dry eyes).      rivaroxaban (XARELTO) 20 MG TABS tablet Take 1 tablet (20 mg total) by mouth daily with supper. Dx: paroxysmal atrial fibrillation 90 tablet 3   triamcinolone cream (KENALOG) 0.1 % Apply 1 application topically daily as needed (itching).      vitamin B-12 (CYANOCOBALAMIN) 1000 MCG tablet Take 1,000 mcg by mouth daily.     No current facility-administered medications for this visit.     Pertinent items are noted in HPI.   Review of Systems:     Cardiac Review of Systems: [Y] = yes  or   [ N ] = no   Chest Pain [  n  ]  Resting SOB [ y  ] Exertional SOB  [ y ]  Orthopnea [  ]   Pedal Edema [ y  ]    Palpitations [ y ] Syncope  [n  ]   Presyncope [n   ]   General Review of Systems: [Y] = yes [  ]=no Constitional: recent weight change [  ];  Wt loss over the last 3 months [   ] anorexia [  ]; fatigue [  ]; nausea [  ]; night sweats [  ]; fever [  ]; or chills [  ];           Eye : blurred vision [  ]; diplopia [   ]; vision changes [  ];  Amaurosis fugax[  ]; Resp: cough Blue.Reese  ];  wheezing[  ];  hemoptysis[ y ]; shortness of breath[y  ]; paroxysmal nocturnal dyspnea[  ]; dyspnea on exertion[  ]; or orthopnea[  ];  GI:  gallstones[  ], vomiting[  ];  dysphagia[  ]; melena[  ];  hematochezia [  ]; heartburn[  ];   Hx of  Colonoscopy[  ]; GU: kidney stones [  ]; hematuria[  ];   dysuria [  ];  nocturia[  ];  history of     obstruction [  ]; urinary frequency [  ]             Skin: rash, swelling[  ];, hair loss[  ];  peripheral edema[  ];  or itching[  ]; Musculosketetal: myalgias[  ];  joint swelling[  ];  joint erythema[  ];  joint pain[  ];  back pain[  ];  Heme/Lymph: bruising[  ];  bleeding[  ];  anemia[  ];  Neuro: TIA[  ];  headaches[  ];  stroke[  ];  vertigo[  ];  seizures[  ];    paresthesias[  ];  difficulty walking[  ];  Psych:depression[  ]; anxiety[  ];  Endocrine: diabetes[  ];  thyroid dysfunction[  ];  Immunizations: Flu up to date [  ]; Pneumococcal up to date [  ];  Other:     PHYSICAL EXAMINATION: BP 135/76    Pulse 76    Temp 97.7 F (36.5 C) (Skin)    Resp 20    Ht 6\' 1"  (1.854 m)    Wt 201 lb (91.2 kg)    SpO2 90% Comment: RA   BMI 26.52 kg/m  General appearance: alert, cooperative, appears stated age and no distress Head: Normocephalic, without obvious abnormality, atraumatic Neck: no adenopathy, no carotid bruit, no JVD, supple, symmetrical, trachea midline and thyroid not enlarged, symmetric, no tenderness/mass/nodules Lymph nodes: Cervical, supraclavicular, and axillary nodes normal.  Resp: clear to auscultation bilaterally Back: symmetric, no curvature. ROM normal. No CVA tenderness. Cardio: regular rate and rhythm, S1, S2 normal, no murmur, click, rub or gallop GI: soft, non-tender; bowel sounds normal; no masses,  no organomegaly Extremities: edema Mild lower extremity edema bilaterally Neurologic: Grossly normal  Diagnostic Studies & Laboratory data:     Recent Radiology Findings:   Ct Chest W Contrast  Result Date: 09/28/2018 CLINICAL DATA:  85 year old male history of productive cough. Recent pneumonia. EXAM: CT CHEST WITH CONTRAST TECHNIQUE: Multidetector CT imaging of the chest was performed during intravenous contrast administration. CONTRAST:  34mL OMNIPAQUE IOHEXOL 300 MG/ML  SOLN COMPARISON:  No priors. FINDINGS: Cardiovascular: Heart size is Heart size is mildly enlarged with left atrial dilatation. There is no significant pericardial fluid, thickening or pericardial calcification. There is aortic atherosclerosis, as well as atherosclerosis of the great vessels of the mediastinum and the coronary arteries, including calcified atherosclerotic plaque in the left main, left anterior descending, left circumflex and right coronary  arteries. Left-sided pacemaker device in place with lead tips terminating in the right atrium, right ventricle and overlying the lateral wall of the left ventricle via the coronary sinus and coronary veins. Mediastinum/Nodes: Multiple prominent borderline enlarged and enlarged mediastinal and bilateral hilar lymph nodes, largest of which measures up to 2.4 cm in short axis in the subcarinal nodal station and 2.4 cm in short axis in the right paratracheal nodal station. Esophagus is unremarkable in appearance. No axillary lymphadenopathy. Lungs/Pleura: Large left lower lobe pulmonary nodule posteriorly measuring 2.9 x 2.9 x 2.6 cm making broad contact with the pleura (axial image 73 of series 4), concerning for potential neoplasm. No confluent consolidative airspace disease. No pleural effusions. Widespread but patchy areas of ground-glass attenuation, septal thickening, subpleural reticulation, thickening of the peribronchovascular interstitium and scattered areas of mild cylindrical bronchiectasis randomly distributed in the lungs bilaterally, concerning for interstitial lung disease. There is also mild to moderate centrilobular and paraseptal emphysema. Upper Abdomen: Aortic atherosclerosis. Musculoskeletal: There are no aggressive appearing lytic or blastic lesions noted in the visualized portions of the skeleton. IMPRESSION: 1. Left lower lobe pulmonary nodule measuring 2.9 x 2.9 x 2.6 cm with extensive mediastinal and bilateral hilar lymphadenopathy. Findings are concerning for primary bronchogenic neoplasm and further evaluation with PET-CT is strongly recommended in the near future. 2. There is a spectrum of findings in the lungs concerning for interstitial lung disease, technically classified as probable usual interstitial pneumonia (UIP) per current ATS guidelines. Outpatient referral to Pulmonology for further evaluation is recommended. Notably, in the setting of interstitial lung disease, the mediastinal  and bilateral hilar lymphadenopathy is nonspecific, and could be benign related to interstitial lung disease. 3. Mild to moderate centrilobular and paraseptal emphysema; imaging findings suggestive of underlying COPD. 4. Cardiomegaly with left atrial dilatation. 5. Aortic atherosclerosis, in addition to left main and 3 vessel coronary artery disease. 6. Additional incidental findings, as above. Aortic Atherosclerosis (ICD10-I70.0) and Emphysema (ICD10-J43.9). Electronically Signed   By: Vinnie Langton M.D.   On: 09/28/2018 08:05   Nm Pet Image Initial (pi) Skull Base To Thigh  Result Date: 10/18/2018 CLINICAL DATA:  Initial treatment strategy for pulmonary mass. EXAM: NUCLEAR MEDICINE PET SKULL BASE TO THIGH TECHNIQUE: 11.7 mCi F-18 FDG was injected intravenously. Full-ring PET imaging was performed from the skull base to thigh after the radiotracer. CT data was obtained and used for attenuation correction and anatomic localization. Fasting blood glucose: 83 mg/dl COMPARISON:  CT 09/27/18 FINDINGS: Mediastinal blood  pool activity: SUV max 1.75 Liver activity: SUV max NA NECK: No hypermetabolic lymph nodes in the neck. Incidental CT findings: none CHEST: Intensely hypermetabolic LEFT lower lobe nodule measures 2.8 cm with SUV max equal 15.5. No additional hypermetabolic pulmonary nodules. Intensely hypermetabolic enlarged LEFT hilar lymph nodes with SUV max equal 8.5. Hypermetabolic and enlarged contralateral lymph nodes with a 2.3 cm RIGHT lower paratracheal lymph node with SUV max equal 11.4. No hypermetabolic supraclavicular nodes. Hypermetabolic RIGHT hilar lymph nodes with SUV max equal 7.5. Hypermetabolic subcarinal node additionally. Incidental CT findings: Extensive interstitial lung disease with subpleural reticulation ABDOMEN/PELVIS: No abnormal activity liver. Adrenal glands are normal. Tiny focus of activity in the medial aspect of the spleen is less than 1 cm in favored benign is (image 150).  Hypermetabolic lymph nodes in the porta hepatis with SUV max equal 4.9. This node is upper limits of normal measuring 1.1 cm short axis (image 178/3). No hypermetabolic pelvic lymph nodes. Incidental CT findings: LEFT colon diverticulosis. Atherosclerotic calcification of the aorta. Prostate enlarged. SKELETON: No focal hypermetabolic activity to suggest skeletal metastasis. Incidental CT findings: none IMPRESSION: 1. Hypermetabolic LEFT lobe pulmonary nodule with ipsilateral and contralateral hypermetabolic nodal metastasis. Findings consistent bronchogenic carcinoma. PET staging favors T1c N3 M0 2. Hypermetabolic lymph node in the porta hepatis. Favor reactive adenopathy but cannot completely exclude nodal metastasis to the upper abdomen. Electronically Signed   By: Suzy Bouchard M.D.   On: 10/18/2018 08:15     I have independently reviewed the above radiology studies  and reviewed the findings with the patient.   Recent Lab Findings: Lab Results  Component Value Date   WBC 5.9 11/12/2017   HGB 13.0 11/12/2017   HCT 38.6 (L) 11/12/2017   PLT 137 (L) 11/12/2017   GLUCOSE 98 11/12/2017   CHOL 104 03/01/2017   TRIG 54 03/01/2017   HDL 33 (L) 03/01/2017   LDLCALC 58 03/01/2017   ALT 13 10/20/2017   AST 19 10/20/2017   NA 132 (L) 11/12/2017   K 3.6 11/12/2017   CL 97 (L) 11/12/2017   CREATININE 1.20 09/27/2018   BUN 13 11/12/2017   CO2 29 11/12/2017   INR 1.07 01/08/2016      Assessment / Plan:   Findings of CT and PET scan suggest T1N3M0 carcinoma of the lung-but without tissue diagnosis.  I have reviewed the films with Dr. Vernard Gambles interventional radiologist who is agreeable with attempting CT directed needle biopsy, core needle to obtain sufficient tissue for tissue diagnosis and also molecular testing of the left lower lobe lung lesion, if this is unsuccessful consideration for general anesthesia with endobronchial ultrasound and transbronchial biopsy of his mediastinal nodes  undertaken but with increased risk.-I have discussed the probable diagnosis with the patient.  While waiting for the needle biopsy to be completed we will obtain a MRI of the brain to rule out mets.  Has a history of paroxysmal atrial fibrillation, on Xarelto also has a history of heart block with a permanent biventricular pacemaker in place.,   He has a history of coronary artery disease having myocardial infarctions and stents most recently placed in August 2019.       I  spent 45 minutes with  the patient face to face and greater then 50% of the time was spent in counseling and coordination of care.    Grace Isaac MD      [address] [place] [address] Office [phone]   Beeper [phone]  10/24/2018  3:34 PM

## 2018-10-26 ENCOUNTER — Other Ambulatory Visit: Payer: Self-pay | Admitting: Cardiovascular Disease

## 2018-10-26 ENCOUNTER — Inpatient Hospital Stay (HOSPITAL_COMMUNITY): Payer: Medicare Other | Attending: Hematology | Admitting: Hematology

## 2018-10-26 ENCOUNTER — Other Ambulatory Visit: Payer: Self-pay

## 2018-10-26 ENCOUNTER — Inpatient Hospital Stay (HOSPITAL_COMMUNITY): Payer: Medicare Other

## 2018-10-26 ENCOUNTER — Encounter: Payer: Self-pay | Admitting: Cardiology

## 2018-10-26 ENCOUNTER — Encounter (HOSPITAL_COMMUNITY): Payer: Self-pay | Admitting: Hematology

## 2018-10-26 DIAGNOSIS — I480 Paroxysmal atrial fibrillation: Secondary | ICD-10-CM | POA: Insufficient documentation

## 2018-10-26 DIAGNOSIS — Z79899 Other long term (current) drug therapy: Secondary | ICD-10-CM | POA: Diagnosis not present

## 2018-10-26 DIAGNOSIS — Z7901 Long term (current) use of anticoagulants: Secondary | ICD-10-CM | POA: Diagnosis not present

## 2018-10-26 DIAGNOSIS — R634 Abnormal weight loss: Secondary | ICD-10-CM | POA: Diagnosis not present

## 2018-10-26 DIAGNOSIS — R918 Other nonspecific abnormal finding of lung field: Secondary | ICD-10-CM | POA: Diagnosis not present

## 2018-10-26 DIAGNOSIS — I2510 Atherosclerotic heart disease of native coronary artery without angina pectoris: Secondary | ICD-10-CM | POA: Diagnosis not present

## 2018-10-26 DIAGNOSIS — I252 Old myocardial infarction: Secondary | ICD-10-CM | POA: Insufficient documentation

## 2018-10-26 DIAGNOSIS — R590 Localized enlarged lymph nodes: Secondary | ICD-10-CM | POA: Diagnosis not present

## 2018-10-26 LAB — COMPREHENSIVE METABOLIC PANEL
ALT: 16 U/L (ref 0–44)
AST: 20 U/L (ref 15–41)
Albumin: 4.2 g/dL (ref 3.5–5.0)
Alkaline Phosphatase: 58 U/L (ref 38–126)
Anion gap: 8 (ref 5–15)
BUN: 14 mg/dL (ref 8–23)
CO2: 26 mmol/L (ref 22–32)
Calcium: 8.8 mg/dL — ABNORMAL LOW (ref 8.9–10.3)
Chloride: 92 mmol/L — ABNORMAL LOW (ref 98–111)
Creatinine, Ser: 1.13 mg/dL (ref 0.61–1.24)
GFR calc Af Amer: >60 mL/min (ref >60)
GFR calc non Af Amer: 60 mL/min — ABNORMAL LOW (ref >60)
Glucose, Bld: 97 mg/dL (ref 70–99)
Potassium: 3.9 mmol/L (ref 3.5–5.1)
Sodium: 126 mmol/L — ABNORMAL LOW (ref 135–145)
Total Bilirubin: 1.6 mg/dL — ABNORMAL HIGH (ref 0.3–1.2)
Total Protein: 7.8 g/dL (ref 6.5–8.1)

## 2018-10-26 LAB — CBC WITH DIFFERENTIAL/PLATELET
Abs Immature Granulocytes: 0.02 10*3/uL (ref 0.00–0.07)
Basophils Absolute: 0 10*3/uL (ref 0.0–0.1)
Basophils Relative: 1 %
Eosinophils Absolute: 0.5 10*3/uL (ref 0.0–0.5)
Eosinophils Relative: 9 %
HCT: 32.4 % — ABNORMAL LOW (ref 39.0–52.0)
Hemoglobin: 10.8 g/dL — ABNORMAL LOW (ref 13.0–17.0)
Immature Granulocytes: 0 %
Lymphocytes Relative: 12 %
Lymphs Abs: 0.7 10*3/uL (ref 0.7–4.0)
MCH: 32.4 pg (ref 26.0–34.0)
MCHC: 33.3 g/dL (ref 30.0–36.0)
MCV: 97.3 fL (ref 80.0–100.0)
Monocytes Absolute: 0.5 10*3/uL (ref 0.1–1.0)
Monocytes Relative: 8 %
Neutro Abs: 4.2 10*3/uL (ref 1.7–7.7)
Neutrophils Relative %: 70 %
Platelets: 180 10*3/uL (ref 150–400)
RBC: 3.33 MIL/uL — ABNORMAL LOW (ref 4.22–5.81)
RDW: 13.9 % (ref 11.5–15.5)
WBC: 5.9 10*3/uL (ref 4.0–10.5)
nRBC: 0 % (ref 0.0–0.2)

## 2018-10-26 NOTE — Progress Notes (Signed)
Remote pacemaker transmission.   

## 2018-10-26 NOTE — Progress Notes (Signed)
[facility] NOTE  Patient Care Team: Sinda Du, MD as PCP - General (Pulmonary Disease) Herminio Commons, MD as PCP - Cardiology (Cardiology) [name], MD as PCP - Electrophysiology (Cardiology) Myrla Halsted, MD as Referring Physician (Family Medicine)  CHIEF COMPLAINTS/PURPOSE OF CONSULTATION:  Highly likely left lung cancer.  HISTORY OF PRESENTING ILLNESS:  Nicholas Sanchez 85 y.o. male is seen in consultation today at the request of Dr. Luan Pulling for newly diagnosed possible lung cancer.  He had pneumonia few months ago, and was having continuous hemoptysis.  This is mostly in the mornings, small quality.  He had series of x-rays followed by a CT scan of the chest on 09/27/2018 which showed left lower lobe lung nodule measuring 2.9 x 2.9 x 2.6 cm with extensive mediastinal and bilateral hilar adenopathy.  This was confirmed with a PET CT scan on 8 72,020.  This showed also porta hepatis lymph node which could be reactive.  He reported 26 pound weight loss in the last 1-1 and half year.  Lives at home with his wife.  Most of the time he is watching TV.  He gets tired after exertion of more than 30 minutes.  He runs vacuuming the house.  He smoked to 1/2 to 3 packs/day for 25 years and quit smoking in 1975.  He worked as a Dance movement psychotherapist at [employer].  He also worked in [employer] for 5 years and worked on [employer]  Family history significant for mother with lung cancer and brother also with lung cancer.  He reported left-sided rib pain for the last couple of days, mainly in the mid scapular line.  Denies any tingling or numbness next 2 days.  However he uses a cane to walk because of unsteadiness.  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  He also had a pacemaker placed in November 2018.  He is on Xarelto for paroxysmal atrial fibrillation.  He was reportedly evaluated by Dr. Servando Snare who arranged for biopsy of the left lung nodule by interventional  radiology.  His appetite is 50%.  Energy levels are about 25%.  He still has small amount of hemoptysis in the mornings.  Denies any bleeding per rectum or melena.  Mild fatigue and shortness of breath on exertion is stable.  Denies any headaches or vision changes.  MEDICAL HISTORY:  Past Medical History:  Diagnosis Date  . CAD (coronary artery disease)    a. STEMI 04/2008 s/p BMS to prox & distal RCA, staged DES to LAD same admission. b. Nuc 03/2017: scar but no ischemia, EF 45-54%. c. 09/2017: NSTEMI - DES x 2 to proximal and mid-RCA  . CKD (chronic kidney disease), stage II   . Cough, persistent   . GERD (gastroesophageal reflux disease)   . History of BPH   . HTN (hypertension)   . Hypercholesteremia   . LV dysfunction    a. EF 45-50% in 01/2016.  . Mild pulmonary hypertension ([place])   . PAF (paroxysmal atrial fibrillation) ([place])   . Presence of permanent cardiac pacemaker   . Sinus node dysfunction ([place])    a. s/p StJude CRT-pacemaker 01/2016.    SURGICAL HISTORY: Past Surgical History:  Procedure Laterality Date  . CARDIAC CATHETERIZATION    . COLONOSCOPY    . COLONOSCOPY N/A 08/27/2017   Dr. [name]: 25 5-25 mm polyps in descending colon, ascending, cecum. 2X3 cm carpet polyp in base of cecum lifted away s/p piecemeal  polypectomy. Tubulovillous adenoma and tubular adenomas  . CORONARY STENT INTERVENTION N/A 10/22/2017   Procedure: CORONARY STENT INTERVENTION;  Surgeon: [name], MD;  Location: [facility];  Service: Cardiovascular;  Laterality: N/A;  . CORONARY STENT PLACEMENT  04/2008   RCA and LAD  . EP IMPLANTABLE DEVICE N/A 01/09/2016   Procedure: BiV Pacemaker Insertion CRT-P;  Surgeon: [name], MD;  Location: [facility];  Service: Cardiovascular;  Laterality: N/A;  . ESOPHAGOGASTRODUODENOSCOPY N/A 08/27/2017   normal esophagus s/p dilation, gastric petechia, small hiatal hernia, normal duodenum  . INSERT / REPLACE / REMOVE PACEMAKER    . MALONEY  DILATION N/A 08/27/2017   Procedure: [name] DILATION;  Surgeon: [name], MD;  [facility];  Service: Endoscopy;  Laterality: N/A;  . RIGHT/LEFT HEART CATH AND CORONARY ANGIOGRAPHY N/A 10/21/2017   Procedure: RIGHT/LEFT HEART CATH AND CORONARY ANGIOGRAPHY;  Surgeon: [name], MD;  Location: [facility];  Service: Cardiovascular;  Laterality: N/A;    SOCIAL HISTORY: Social History   Socioeconomic History  . Marital status: Married    Spouse name: Air traffic controller  . Number of children: 3  . Years of education: Not on file  . Highest education level: Not on file  Occupational History  . Occupation: retired    Comment: Geographical information systems officer  . Financial resource strain: Not very hard  . Food insecurity    Worry: Never true    Inability: Never true  . Transportation needs    Medical: No    Non-medical: No  Tobacco Use  . Smoking status: Former Smoker    Packs/day: 2.50    Years: 25.00    Pack years: 62.50    Types: Cigarettes    Quit date: 06/14/1973    Years since quitting: 45.3  . Smokeless tobacco: Former Systems developer    Types: Chew    Quit date: 06/14/1973  . Tobacco comment: some/chewed very little for about 2 years  Substance and Sexual Activity  . Alcohol use: No  . Drug use: No  . Sexual activity: Not on file  Lifestyle  . Physical activity    Days per week: 0 days    Minutes per session: 0 min  . Stress: Not at all  Relationships  . Social connections    Talks on phone: More than three times a week    Gets together: More than three times a week    Attends religious service: More than 4 times per year    Active member of club or organization: No    Attends meetings of clubs or organizations: Never    Relationship status: Married  . Intimate partner violence    Fear of current or ex partner: No    Emotionally abused: No    Physically abused: No    Forced sexual activity: No  Other Topics Concern  . 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     FAMILY HISTORY: Family History  Problem Relation Age of Onset  . Emphysema Mother        mother died with emphysema and cancer  . Cancer Mother   . Prostate cancer Brother   . Heart disease Brother   . Hypothyroidism Daughter   . Hypothyroidism Daughter   . Colon cancer Neg Hx   . Colon polyps Neg Hx     ALLERGIES:  is allergic to ace inhibitors and tape.  MEDICATIONS:  Current Outpatient Medications  Medication Sig Dispense Refill  . acetaminophen (TYLENOL) 500 MG tablet Take 500 mg by mouth every 6 (six) hours as needed for pain.    [name] aspirin EC 81 MG tablet Take 81 mg by mouth daily.    . cholecalciferol (VITAMIN D) 1000 units tablet Take 1,000 Units by mouth 2 (two) times daily.    . clopidogrel (PLAVIX) 75 MG tablet TAKE ONE (1) TABLET BY MOUTH EVERY DAY 90 tablet 3  . dorzolamide-timolol (COSOPT) 22.3-6.8 MG/ML ophthalmic solution Place 1 drop into the left eye 2 (two) times daily.    . ferrous sulfate 325 (65 FE) MG tablet Take 325 mg by mouth 3 (three) times daily.    . hydrochlorothiazide (HYDRODIURIL) 25 MG tablet TAKE ONE (1) TABLET BY MOUTH EVERY DAY (Patient taking differently: Take 25 mg by mouth daily. ) 90 tablet 3  . losartan (COZAAR) 25 MG tablet TAKE ONE TABLET BY MOUTY EVERY DAY (Patient taking differently: Take 25 mg by mouth daily. ) 90 tablet 1  . lovastatin (MEVACOR) 10 MG tablet TAKE ONE (1) TABLET BY MOUTH EVERY DAY 90 tablet 3  . metoprolol succinate (TOPROL XL) 25 MG 24 hr tablet Take 1 tablet (25 mg total) by mouth daily. 90 tablet 3  . pantoprazole (PROTONIX) 40 MG tablet Take 40 mg by mouth daily.    [name] Propylene Glycol (SYSTANE BALANCE OP) Place 1 drop into both eyes 2 (two) times daily as needed (dry eyes).     . rivaroxaban (XARELTO) 20 MG TABS tablet Take 1 tablet (20 mg total) by mouth daily with supper. Dx: paroxysmal atrial fibrillation 90 tablet 3  . triamcinolone cream (KENALOG) 0.1 % Apply 1  application topically daily as needed (itching).     . vitamin B-12 (CYANOCOBALAMIN) 1000 MCG tablet Take 1,000 mcg by mouth daily.    . nitroGLYCERIN (NITROSTAT) 0.4 MG SL tablet Place 1 tablet (0.4 mg total) under the tongue every 5 (five) minutes x 3 doses as needed for chest pain. (Patient not taking: Reported on 10/26/2018) 25 tablet 2   No current facility-administered medications for this visit.     REVIEW OF SYSTEMS:   Constitutional: Denies fevers, chills or abnormal night sweats.  Positive for headaches.  Positive for fatigue. Eyes: Denies blurriness of vision, double vision or watery eyes Ears, nose, mouth, throat, and face: Denies mucositis or sore throat Respiratory: Dyspnea on exertion present.  Cough with hemoptysis. Cardiovascular: Denies palpitation, chest discomfort or lower extremity swelling Gastrointestinal:  Denies nausea, heartburn or change in bowel habits Skin: Denies abnormal skin rashes Lymphatics: Denies new lymphadenopathy or easy bruising Neurological:Denies numbness, tingling or new weaknesses Behavioral/Psych: Mood is stable, no new changes  All other systems were reviewed with the patient and are negative.  PHYSICAL EXAMINATION: ECOG PERFORMANCE STATUS: 2 - Symptomatic, <50% confined to bed  Vitals:   10/26/18 1345  BP: 137/66  Pulse: 66  Resp: 18  Temp: 97.8 F (36.6 C)  SpO2: 96%   Filed Weights   10/26/18 1345  Weight: 201 lb 4.8 oz (91.3 kg)    GENERAL:alert, no distress and comfortable SKIN: skin color, texture, turgor are normal, no rashes or significant lesions EYES: normal, conjunctiva are pink and non-injected, sclera clear OROPHARYNX:no exudate, no erythema and lips, buccal mucosa, and tongue normal  NECK: supple, thyroid normal size, non-tender, without nodularity LYMPH:  no palpable lymphadenopathy in the cervical, axillary or inguinal LUNGS: clear to auscultation and percussion with normal  breathing effort HEART: regular rate &  rhythm and no murmurs and no lower extremity edema ABDOMEN:abdomen soft, non-tender and normal bowel sounds Musculoskeletal:no cyanosis of digits and no clubbing  PSYCH: alert & oriented x 3 with fluent speech NEURO: no focal motor/sensory deficits  LABORATORY DATA:  I have reviewed the data as listed Lab Results  Component Value Date   WBC 5.9 11/12/2017   HGB 13.0 11/12/2017   HCT 38.6 (L) 11/12/2017   MCV 96.7 11/12/2017   PLT 137 (L) 11/12/2017     Chemistry      Component Value Date/Time   NA 132 (L) 11/12/2017 1605   K 3.6 11/12/2017 1605   CL 97 (L) 11/12/2017 1605   CO2 29 11/12/2017 1605   BUN 13 11/12/2017 1605   CREATININE 1.20 09/27/2018 1103   CREATININE 1.16 (H) 01/07/2016 0757      Component Value Date/Time   CALCIUM 9.2 11/12/2017 1605   ALKPHOS 36 (L) 10/20/2017 0920   AST 19 10/20/2017 0920   ALT 13 10/20/2017 0920   BILITOT 1.0 10/20/2017 0920       RADIOGRAPHIC STUDIES: I have personally reviewed the radiological images as listed and agreed with the findings in the report. Ct Chest W Contrast  Result Date: 09/28/2018 CLINICAL DATA:  85 year old male history of productive cough. Recent pneumonia. EXAM: CT CHEST WITH CONTRAST TECHNIQUE: Multidetector CT imaging of the chest was performed during intravenous contrast administration. CONTRAST:  27mL OMNIPAQUE IOHEXOL 300 MG/ML  SOLN COMPARISON:  No priors. FINDINGS: Cardiovascular: Heart size is Heart size is mildly enlarged with left atrial dilatation. There is no significant pericardial fluid, thickening or pericardial calcification. There is aortic atherosclerosis, as well as atherosclerosis of the great vessels of the mediastinum and the coronary arteries, including calcified atherosclerotic plaque in the left main, left anterior descending, left circumflex and right coronary arteries. Left-sided pacemaker device in place with lead tips terminating in the right atrium, right ventricle and overlying the  lateral wall of the left ventricle via the coronary sinus and coronary veins. Mediastinum/Nodes: Multiple prominent borderline enlarged and enlarged mediastinal and bilateral hilar lymph nodes, largest of which measures up to 2.4 cm in short axis in the subcarinal nodal station and 2.4 cm in short axis in the right paratracheal nodal station. Esophagus is unremarkable in appearance. No axillary lymphadenopathy. Lungs/Pleura: Large left lower lobe pulmonary nodule posteriorly measuring 2.9 x 2.9 x 2.6 cm making broad contact with the pleura (axial image 73 of series 4), concerning for potential neoplasm. No confluent consolidative airspace disease. No pleural effusions. Widespread but patchy areas of ground-glass attenuation, septal thickening, subpleural reticulation, thickening of the peribronchovascular interstitium and scattered areas of mild cylindrical bronchiectasis randomly distributed in the lungs bilaterally, concerning for interstitial lung disease. There is also mild to moderate centrilobular and paraseptal emphysema. Upper Abdomen: Aortic atherosclerosis. Musculoskeletal: There are no aggressive appearing lytic or blastic lesions noted in the visualized portions of the skeleton. IMPRESSION: 1. Left lower lobe pulmonary nodule measuring 2.9 x 2.9 x 2.6 cm with extensive mediastinal and bilateral hilar lymphadenopathy. Findings are concerning for primary bronchogenic neoplasm and further evaluation with PET-CT is strongly recommended in the near future. 2. There is a spectrum of findings in the lungs concerning for interstitial lung disease, technically classified as probable usual interstitial pneumonia (UIP) per current ATS guidelines. Outpatient referral to Pulmonology for further evaluation is recommended. Notably, in the setting of interstitial lung disease, the mediastinal and bilateral hilar lymphadenopathy is nonspecific, and  could be benign related to interstitial lung disease. 3. Mild to moderate  centrilobular and paraseptal emphysema; imaging findings suggestive of underlying COPD. 4. Cardiomegaly with left atrial dilatation. 5. Aortic atherosclerosis, in addition to left main and 3 vessel coronary artery disease. 6. Additional incidental findings, as above. Aortic Atherosclerosis (ICD10-I70.0) and Emphysema (ICD10-J43.9). Electronically Signed   By: Vinnie Langton M.D.   On: 09/28/2018 08:05   Nm Pet Image Initial (pi) Skull Base To Thigh  Result Date: 10/18/2018 CLINICAL DATA:  Initial treatment strategy for pulmonary mass. EXAM: NUCLEAR MEDICINE PET SKULL BASE TO THIGH TECHNIQUE: 11.7 mCi F-18 FDG was injected intravenously. Full-ring PET imaging was performed from the skull base to thigh after the radiotracer. CT data was obtained and used for attenuation correction and anatomic localization. Fasting blood glucose: 83 mg/dl COMPARISON:  CT 09/27/18 FINDINGS: Mediastinal blood pool activity: SUV max 1.75 Liver activity: SUV max NA NECK: No hypermetabolic lymph nodes in the neck. Incidental CT findings: none CHEST: Intensely hypermetabolic LEFT lower lobe nodule measures 2.8 cm with SUV max equal 15.5. No additional hypermetabolic pulmonary nodules. Intensely hypermetabolic enlarged LEFT hilar lymph nodes with SUV max equal 8.5. Hypermetabolic and enlarged contralateral lymph nodes with a 2.3 cm RIGHT lower paratracheal lymph node with SUV max equal 11.4. No hypermetabolic supraclavicular nodes. Hypermetabolic RIGHT hilar lymph nodes with SUV max equal 7.5. Hypermetabolic subcarinal node additionally. Incidental CT findings: Extensive interstitial lung disease with subpleural reticulation ABDOMEN/PELVIS: No abnormal activity liver. Adrenal glands are normal. Tiny focus of activity in the medial aspect of the spleen is less than 1 cm in favored benign is (image 150). Hypermetabolic lymph nodes in the porta hepatis with SUV max equal 4.9. This node is upper limits of normal measuring 1.1 cm short axis  (image 178/3). No hypermetabolic pelvic lymph nodes. Incidental CT findings: LEFT colon diverticulosis. Atherosclerotic calcification of the aorta. Prostate enlarged. SKELETON: No focal hypermetabolic activity to suggest skeletal metastasis. Incidental CT findings: none IMPRESSION: 1. Hypermetabolic LEFT lobe pulmonary nodule with ipsilateral and contralateral hypermetabolic nodal metastasis. Findings consistent bronchogenic carcinoma. PET staging favors T1c N3 M0 2. Hypermetabolic lymph node in the porta hepatis. Favor reactive adenopathy but cannot completely exclude nodal metastasis to the upper abdomen. Electronically Signed   By: Suzy Bouchard M.D.   On: 10/18/2018 08:15    ASSESSMENT & PLAN:  Mass of lower lobe of left lung 1.  Highly likely clinical stage III ([name]) left lung cancer: - Patient recently had pneumonia followed by hemoptysis. -CT scan of the chest with contrast on 09/27/2018 showed left lower lobe lung nodule measuring 2.9 x 2.9 x 2.6 cm with extensive mediastinal and bilateral hilar adenopathy. - PET CT scan on 10/17/2018 showed hypermetabolic left lower lobe lung nodule, left hilar, contralateral right lower paratracheal lymph node along with hypermetabolic right hilar lymph nodes.  Hypermetabolic subcarinal lymph node present.  Hypermetabolic lymph node in the porta hepatis with SUV 4.9, measuring 1.1 cm favoring reactive adenopathy, but close follow-up recommended. - Reported 26 pound weight loss, over the last 1-1 and half year. - Smoked 1/2 to 3 packs/day for 25 years, quit in 1975.  He worked on [employer] when he was in [employer] for 4-5 years. - We had prolonged discussion about the findings on the CT scan. -Apparently he was seen by Dr. Servando Snare, who arranged for biopsy of the left lung nodule on Monday. -I have recommended doing a CT scan of the head with and without contrast to complete  staging work-up. -I will see him back 5 days after the biopsy to discuss treatment  plan and prognosis.  We will obtain routine labs today.  2.  Health maintenance: -25 sessile polyps in the descending colon, ascending colon and the cecum on colonoscopy done on 08/19/2017.  These were consistent with tubular adenomas.  3.  CAD: - He had MI x2, in 2010 and August 2019.  He had a pacemaker put in November 2018.  He is on aspirin and Plavix.  4.  Paroxysmal atrial fibrillation: -He is on Xarelto 20 mg daily.  Orders Placed This Encounter  Procedures  . CT Head W Wo Contrast    Standing Status:   Future    Standing Expiration Date:   10/26/2019    Order Specific Question:   ** REASON FOR EXAM (FREE TEXT)    Answer:   lung mass    Order Specific Question:   If indicated for the ordered procedure, I authorize the administration of contrast media per Radiology protocol    Answer:   Yes    Order Specific Question:   Preferred imaging location?    Answer:   [facility]    Order Specific Question:   Radiology Contrast Protocol - do NOT remove file path    Answer:   [email or web address]  . CBC with Differential/Platelet    Standing Status:   Future    Number of Occurrences:   1    Standing Expiration Date:   10/26/2019  . Comprehensive metabolic panel    Standing Status:   Future    Number of Occurrences:   1    Standing Expiration Date:   10/26/2019    All questions were answered. The patient knows to call the clinic with any problems, questions or concerns.     Derek Jack, MD 10/26/2018 2:54 PM

## 2018-10-26 NOTE — Patient Instructions (Signed)
[place] at [facility] Discharge Instructions  You were seen today by Dr. Delton Coombes. He went over your history, family history and how you've been feeling lately. He will get you scheduled for a CT of your head for further evaluation. He will have blood work drawn today before you leave. He will see you back after your scan for follow up.   Thank you for choosing [place] at [facility] to provide your oncology and hematology care.  To afford each patient quality time with our provider, please arrive at least 15 minutes before your scheduled appointment time.   If you have a lab appointment with the [facility] please come in thru the  Main Entrance and check in at the main information desk  You need to re-schedule your appointment should you arrive 10 or more minutes late.  We strive to give you quality time with our providers, and arriving late affects you and other patients whose appointments are after yours.  Also, if you no show three or more times for appointments you may be dismissed from the clinic at the providers discretion.     Again, thank you for choosing [facility].  Our hope is that these requests will decrease the amount of time that you wait before being seen by our physicians.       _____________________________________________________________  Should you have questions after your visit to [facility], please contact our office at [phone] between the hours of 8:00 a.m. and 4:30 p.m.  Voicemails left after 4:00 p.m. will not be returned until the following business day.  For prescription refill requests, have your pharmacy contact our office and allow 72 hours.    Cancer Center Support Programs:   > Cancer Support Group  2nd Tuesday of the month 1pm-2pm, Journey Room

## 2018-10-26 NOTE — Assessment & Plan Note (Signed)
1.  Highly likely clinical stage III ([name]) left lung cancer: - Patient recently had pneumonia followed by hemoptysis. -CT scan of the chest with contrast on 09/27/2018 showed left lower lobe lung nodule measuring 2.9 x 2.9 x 2.6 cm with extensive mediastinal and bilateral hilar adenopathy. - PET CT scan on 10/17/2018 showed hypermetabolic left lower lobe lung nodule, left hilar, contralateral right lower paratracheal lymph node along with hypermetabolic right hilar lymph nodes.  Hypermetabolic subcarinal lymph node present.  Hypermetabolic lymph node in the porta hepatis with SUV 4.9, measuring 1.1 cm favoring reactive adenopathy, but close follow-up recommended. - Reported 26 pound weight loss, over the last 1-1 and half year. - Smoked 1/2 to 3 packs/day for 25 years, quit in 1975.  He worked on [employer] when he was in [employer] for 4-5 years. - We had prolonged discussion about the findings on the CT scan. -Apparently he was seen by Dr. Servando Snare, who arranged for biopsy of the left lung nodule on Monday. -I have recommended doing a CT scan of the head with and without contrast to complete staging work-up. -I will see him back 5 days after the biopsy to discuss treatment plan and prognosis.  We will obtain routine labs today.  2.  Health maintenance: -25 sessile polyps in the descending colon, ascending colon and the cecum on colonoscopy done on 08/19/2017.  These were consistent with tubular adenomas.  3.  CAD: - He had MI x2, in 2010 and August 2019.  He had a pacemaker put in November 2018.  He is on aspirin and Plavix.  4.  Paroxysmal atrial fibrillation: -He is on Xarelto 20 mg daily.

## 2018-10-28 ENCOUNTER — Other Ambulatory Visit: Payer: Self-pay

## 2018-10-28 ENCOUNTER — Other Ambulatory Visit (HOSPITAL_COMMUNITY)
Admission: RE | Admit: 2018-10-28 | Discharge: 2018-10-28 | Disposition: A | Discharge status: Home / Self Care | Payer: Medicare Other | Source: Ambulatory Visit | Attending: Cardiothoracic Surgery | Admitting: Cardiothoracic Surgery

## 2018-10-28 ENCOUNTER — Ambulatory Visit (HOSPITAL_COMMUNITY): Payer: Medicare Other

## 2018-10-28 ENCOUNTER — Other Ambulatory Visit: Payer: Self-pay | Admitting: Student

## 2018-10-28 DIAGNOSIS — Z20828 Contact with and (suspected) exposure to other viral communicable diseases: Secondary | ICD-10-CM | POA: Diagnosis not present

## 2018-10-28 DIAGNOSIS — Z01812 Encounter for preprocedural laboratory examination: Secondary | ICD-10-CM | POA: Diagnosis not present

## 2018-10-28 LAB — SARS CORONAVIRUS 2 (TAT 6-24 HRS): SARS Coronavirus 2: NEGATIVE

## 2018-10-31 ENCOUNTER — Ambulatory Visit (HOSPITAL_COMMUNITY)
Admission: RE | Admit: 2018-10-31 | Discharge: 2018-10-31 | Disposition: A | Discharge status: Home / Self Care | Payer: Medicare Other | Source: Ambulatory Visit | Attending: Diagnostic Radiology | Admitting: Diagnostic Radiology

## 2018-10-31 ENCOUNTER — Encounter (HOSPITAL_COMMUNITY): Payer: Self-pay

## 2018-10-31 ENCOUNTER — Other Ambulatory Visit: Payer: Self-pay

## 2018-10-31 ENCOUNTER — Ambulatory Visit (HOSPITAL_COMMUNITY)
Admission: RE | Admit: 2018-10-31 | Discharge: 2018-10-31 | Disposition: A | Discharge status: Home / Self Care | Payer: Medicare Other | Source: Ambulatory Visit | Attending: Cardiothoracic Surgery | Admitting: Cardiothoracic Surgery

## 2018-10-31 DIAGNOSIS — C3432 Malignant neoplasm of lower lobe, left bronchus or lung: Secondary | ICD-10-CM | POA: Diagnosis not present

## 2018-10-31 DIAGNOSIS — R591 Generalized enlarged lymph nodes: Secondary | ICD-10-CM | POA: Diagnosis not present

## 2018-10-31 DIAGNOSIS — I700 Atherosclerosis of aorta: Secondary | ICD-10-CM | POA: Insufficient documentation

## 2018-10-31 DIAGNOSIS — R911 Solitary pulmonary nodule: Secondary | ICD-10-CM | POA: Insufficient documentation

## 2018-10-31 DIAGNOSIS — R918 Other nonspecific abnormal finding of lung field: Secondary | ICD-10-CM | POA: Diagnosis not present

## 2018-10-31 DIAGNOSIS — J9811 Atelectasis: Secondary | ICD-10-CM | POA: Diagnosis not present

## 2018-10-31 DIAGNOSIS — Z9889 Other specified postprocedural states: Secondary | ICD-10-CM

## 2018-10-31 LAB — CBC
HCT: 35.7 % — ABNORMAL LOW (ref 39.0–52.0)
Hemoglobin: 11.8 g/dL — ABNORMAL LOW (ref 13.0–17.0)
MCH: 32 pg (ref 26.0–34.0)
MCHC: 33.1 g/dL (ref 30.0–36.0)
MCV: 96.7 fL (ref 80.0–100.0)
Platelets: 207 10*3/uL (ref 150–400)
RBC: 3.69 MIL/uL — ABNORMAL LOW (ref 4.22–5.81)
RDW: 13.8 % (ref 11.5–15.5)
WBC: 6.6 10*3/uL (ref 4.0–10.5)
nRBC: 0 % (ref 0.0–0.2)

## 2018-10-31 LAB — PROTIME-INR
INR: 1.2 (ref 0.8–1.2)
Prothrombin Time: 14.9 seconds (ref 11.4–15.2)

## 2018-10-31 MED ORDER — FENTANYL CITRATE (PF) 100 MCG/2ML IJ SOLN
INTRAMUSCULAR | Status: AC | PRN
  Administered 2018-10-31: 25 ug via INTRAVENOUS
  Administered 2018-10-31: 12.5 ug via INTRAVENOUS

## 2018-10-31 MED ORDER — SODIUM CHLORIDE 0.9 % IV SOLN: INTRAVENOUS | Status: DC

## 2018-10-31 MED ORDER — LIDOCAINE HCL 1 % IJ SOLN
INTRAMUSCULAR | Status: AC
  Filled 2018-10-31: qty 20

## 2018-10-31 MED ORDER — MIDAZOLAM HCL 2 MG/2ML IJ SOLN
INTRAMUSCULAR | Status: AC
  Filled 2018-10-31: qty 2

## 2018-10-31 MED ORDER — FENTANYL CITRATE (PF) 100 MCG/2ML IJ SOLN
INTRAMUSCULAR | Status: AC
  Filled 2018-10-31: qty 2

## 2018-10-31 MED ORDER — MIDAZOLAM HCL 2 MG/2ML IJ SOLN
INTRAMUSCULAR | Status: AC | PRN
  Administered 2018-10-31: 1 mg via INTRAVENOUS

## 2018-10-31 NOTE — H&P (Signed)
Chief Complaint: Patient was seen in consultation today for left lung mass biopsy at the request of Gerhardt,Edward B  Referring Physician(s): Grace Isaac  Supervising Physician: Markus Daft  Patient Status: [facility] - Out-pt  History of Present Illness: Nicholas Sanchez is a 85 y.o. male   Newly dx probable lung cancer Several months of chronic cough and minor hemoptysis Antibiotics were no help Previous smoker-- quit 1974 Hx CAD-- on Xarelto- LD 8/28  CT 09/27/18: IMPRESSION: 1. Left lower lobe pulmonary nodule measuring 2.9 x 2.9 x 2.6 cm with extensive mediastinal and bilateral hilar lymphadenopathy. Findings are concerning for primary bronchogenic neoplasm and further evaluation with PET-CT is strongly recommended in the near future. 2. There is a spectrum of findings in the lungs concerning for interstitial lung disease, technically classified as probable usual interstitial pneumonia (UIP) per current ATS guidelines. Outpatient referral to Pulmonology for further evaluation is recommended. Notably, in the setting of interstitial lung disease, the mediastinal and bilateral hilar lymphadenopathy is nonspecific, and could be benign related to interstitial lung disease.  PET 10/17/18: IMPRESSION: 1. Hypermetabolic LEFT lobe pulmonary nodule with ipsilateral and contralateral hypermetabolic nodal metastasis. Findings consistent bronchogenic carcinoma. PET staging favors T1c N3 M0 2. Hypermetabolic lymph node in the porta hepatis. Favor reactive adenopathy but cannot completely exclude nodal metastasis to the upper abdomen.  Seen by Dr Servando Snare: note 10/24/18:Assessment / Plan:   Findings of CT and PET scan suggest T1N3M0 carcinoma of the lung-but without tissue diagnosis.  I have reviewed the films with Dr. Vernard Gambles interventional radiologist who is agreeable with attempting CT directed needle biopsy, core needle to obtain sufficient tissue for tissue diagnosis and also  molecular testing of the left lower lobe lung lesion, if this is unsuccessful consideration for general anesthesia with endobronchial ultrasound and transbronchial biopsy of his mediastinal nodes undertaken but with increased risk.-I have discussed the probable diagnosis with the patient.  While waiting for the needle biopsy to be completed we will obtain a MRI of the brain to rule out mets.  Now scheduled for LLL mass biopsy  Past Medical History:  Diagnosis Date  . CAD (coronary artery disease)    a. STEMI 04/2008 s/p BMS to prox & distal RCA, staged DES to LAD same admission. b. Nuc 03/2017: scar but no ischemia, EF 45-54%. c. 09/2017: NSTEMI - DES x 2 to proximal and mid-RCA  . CKD (chronic kidney disease), stage II   . Cough, persistent   . GERD (gastroesophageal reflux disease)   . History of BPH   . HTN (hypertension)   . Hypercholesteremia   . LV dysfunction    a. EF 45-50% in 01/2016.  . Mild pulmonary hypertension ([place])   . PAF (paroxysmal atrial fibrillation) ([place])   . Presence of permanent cardiac pacemaker   . Sinus node dysfunction ([place])    a. s/p StJude CRT-pacemaker 01/2016.    Past Surgical History:  Procedure Laterality Date  . CARDIAC CATHETERIZATION    . COLONOSCOPY    . COLONOSCOPY N/A 08/27/2017   Dr. [name]: 25 5-25 mm polyps in descending colon, ascending, cecum. 2X3 cm carpet polyp in base of cecum lifted away s/p piecemeal polypectomy. Tubulovillous adenoma and tubular adenomas  . CORONARY STENT INTERVENTION N/A 10/22/2017   Procedure: CORONARY STENT INTERVENTION;  Surgeon: [name], MD;  Location: [place] [facility];  Service: Cardiovascular;  Laterality: N/A;  . CORONARY STENT PLACEMENT  04/2008   RCA and LAD  . EP IMPLANTABLE DEVICE N/A 01/09/2016  Procedure: BiV Pacemaker Insertion CRT-P;  Surgeon: [name], MD;  Location: [place] [facility];  Service: Cardiovascular;  Laterality: N/A;  . ESOPHAGOGASTRODUODENOSCOPY N/A 08/27/2017   normal  esophagus s/p dilation, gastric petechia, small hiatal hernia, normal duodenum  . INSERT / REPLACE / REMOVE PACEMAKER    . MALONEY DILATION N/A 08/27/2017   Procedure: [name] DILATION;  Surgeon: [name], MD;  [facility];  Service: Endoscopy;  Laterality: N/A;  . RIGHT/LEFT HEART CATH AND CORONARY ANGIOGRAPHY N/A 10/21/2017   Procedure: RIGHT/LEFT HEART CATH AND CORONARY ANGIOGRAPHY;  Surgeon: [name], MD;  Location: [place] [facility];  Service: Cardiovascular;  Laterality: N/A;    Allergies: Ace inhibitors and Tape  Medications: Prior to Admission medications   Medication Sig Start Date End Date Taking? Authorizing Provider  acetaminophen (TYLENOL) 500 MG tablet Take 500 mg by mouth every 6 (six) hours as needed for pain.   Yes [provider]  aspirin EC 81 MG tablet Take 81 mg by mouth daily.   Yes [provider]  cholecalciferol (VITAMIN D) 1000 units tablet Take 1,000 Units by mouth 2 (two) times daily.   Yes [provider]  dorzolamide-timolol (COSOPT) 22.3-6.8 MG/ML ophthalmic solution Place 1 drop into the left eye 2 (two) times daily.   Yes [provider]  ferrous sulfate 325 (65 FE) MG tablet Take 325 mg by mouth 3 (three) times daily.   Yes [provider]  hydrochlorothiazide (HYDRODIURIL) 25 MG tablet TAKE ONE (1) TABLET BY MOUTH EVERY DAY Patient taking differently: Take 25 mg by mouth daily.  03/10/18  Yes Herminio Commons, MD  losartan (COZAAR) 25 MG tablet TAKE ONE TABLET BY [place] Patient taking differently: Take 25 mg by mouth daily.  05/26/18  Yes Herminio Commons, MD  lovastatin (MEVACOR) 10 MG tablet TAKE ONE (1) TABLET BY MOUTH EVERY DAY 06/14/18  Yes Herminio Commons, MD  metoprolol succinate (TOPROL XL) 25 MG 24 hr tablet Take 1 tablet (25 mg total) by mouth daily. 08/08/18  Yes Herminio Commons, MD  pantoprazole (PROTONIX) 40 MG tablet Take 40 mg by mouth daily.   Yes  [provider]  Propylene Glycol (SYSTANE BALANCE OP) Place 1 drop into both eyes 2 (two) times daily as needed (dry eyes).    Yes [provider]  rivaroxaban (XARELTO) 20 MG TABS tablet Take 1 tablet (20 mg total) by mouth daily with supper. Dx: paroxysmal atrial fibrillation 07/30/16  Yes [name], MD  triamcinolone cream (KENALOG) 0.1 % Apply 1 application topically daily as needed (itching).    Yes [provider]  vitamin B-12 (CYANOCOBALAMIN) 1000 MCG tablet Take 1,000 mcg by mouth daily.   Yes [provider]  clopidogrel (PLAVIX) 75 MG tablet TAKE ONE (1) TABLET BY MOUTH EVERY DAY 10/26/18   Herminio Commons, MD  nitroGLYCERIN (NITROSTAT) 0.4 MG SL tablet Place 1 tablet (0.4 mg total) under the tongue every 5 (five) minutes x 3 doses as needed for chest pain. Patient not taking: Reported on 10/26/2018 10/23/17   Consuelo Pandy, PA-C     Family History  Problem Relation Age of Onset  . Emphysema Mother        mother died with emphysema and cancer  . Cancer Mother   . Prostate cancer Brother   . Heart disease Brother   . Hypothyroidism Daughter   . Hypothyroidism Daughter   . Colon cancer Neg  Hx   . Colon polyps Neg Hx     Social History   Socioeconomic History  . Marital status: Married    Spouse name: Air traffic controller  . Number of children: 3  . Years of education: Not on file  . Highest education level: Not on file  Occupational History  . Occupation: retired    Comment: Geographical information systems officer  . Financial resource strain: Not very hard  . Food insecurity    Worry: Never true    Inability: Never true  . Transportation needs    Medical: No    Non-medical: No  Tobacco Use  . Smoking status: Former Smoker    Packs/day: 2.50    Years: 25.00    Pack years: 62.50    Types: Cigarettes    Quit date: 06/14/1973    Years since quitting: 45.4  . Smokeless tobacco: Former Systems developer    Types: Chew    Quit date: 06/14/1973  . Tobacco  comment: some/chewed very little for about 2 years  Substance and Sexual Activity  . Alcohol use: No  . Drug use: No  . Sexual activity: Not on file  Lifestyle  . Physical activity    Days per week: 0 days    Minutes per session: 0 min  . Stress: Not at all  Relationships  . Social connections    Talks on phone: More than three times a week    Gets together: More than three times a week    Attends religious service: More than 4 times per year    Active member of club or organization: No    Attends meetings of clubs or organizations: Never    Relationship status: Married  Other Topics Concern  . 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    Review of Systems: A 12 point ROS discussed and pertinent positives are indicated in the HPI above.  All other systems are negative.  Review of Systems  Constitutional: Positive for fatigue. Negative for activity change and fever.  Respiratory: Positive for cough and shortness of breath.   Cardiovascular: Negative for chest pain.  Gastrointestinal: Negative for abdominal pain.  Neurological: Negative for weakness.  Psychiatric/Behavioral: Negative for behavioral problems and confusion.    Vital Signs: BP (!) 143/73 (BP Location: Left Arm)   Temp 98 F (36.7 C) (Oral)   Resp 18   Ht 6\' 1"  (1.854 m)   Wt 203 lb (92.1 kg)   SpO2 99%   BMI 26.78 kg/m   Physical Exam Vitals signs reviewed.  Cardiovascular:     Rate and Rhythm: Normal rate.     Heart sounds: Normal heart sounds.  Pulmonary:     Effort: No respiratory distress.     Breath sounds: Normal breath sounds. No wheezing.  Abdominal:     Tenderness: There is no abdominal tenderness.  Musculoskeletal: Normal range of motion.  Skin:    General: Skin is warm and dry.  Neurological:     Mental Status: He is alert and oriented to person, place, and time.  Psychiatric:        Mood and Affect: Mood normal.        Behavior: Behavior  normal.        Thought Content: Thought content normal.        Judgment: Judgment normal.     Imaging: Nm Pet Image Initial (pi) Skull Base To Thigh  Result Date: 10/18/2018 CLINICAL DATA:  Initial treatment strategy for pulmonary mass. EXAM: NUCLEAR MEDICINE PET SKULL BASE TO THIGH TECHNIQUE: 11.7 mCi F-18 FDG was injected intravenously. Full-ring PET imaging was performed from the skull base to thigh after the radiotracer. CT data was obtained and used for attenuation correction and anatomic localization. Fasting blood glucose: 83 mg/dl COMPARISON:  CT 09/27/18 FINDINGS: Mediastinal blood pool activity: SUV max 1.75 Liver activity: SUV max NA NECK: No hypermetabolic lymph nodes in the neck. Incidental CT findings: none CHEST: Intensely hypermetabolic LEFT lower lobe nodule measures 2.8 cm with SUV max equal 15.5. No additional hypermetabolic pulmonary nodules. Intensely hypermetabolic enlarged LEFT hilar lymph nodes with SUV max equal 8.5. Hypermetabolic and enlarged contralateral lymph nodes with a 2.3 cm RIGHT lower paratracheal lymph node with SUV max equal 11.4. No hypermetabolic supraclavicular nodes. Hypermetabolic RIGHT hilar lymph nodes with SUV max equal 7.5. Hypermetabolic subcarinal node additionally. Incidental CT findings: Extensive interstitial lung disease with subpleural reticulation ABDOMEN/PELVIS: No abnormal activity liver. Adrenal glands are normal. Tiny focus of activity in the medial aspect of the spleen is less than 1 cm in favored benign is (image 150). Hypermetabolic lymph nodes in the porta hepatis with SUV max equal 4.9. This node is upper limits of normal measuring 1.1 cm short axis (image 178/3). No hypermetabolic pelvic lymph nodes. Incidental CT findings: LEFT colon diverticulosis. Atherosclerotic calcification of the aorta. Prostate enlarged. SKELETON: No focal hypermetabolic activity to suggest skeletal metastasis. Incidental CT findings: none IMPRESSION: 1. Hypermetabolic  LEFT lobe pulmonary nodule with ipsilateral and contralateral hypermetabolic nodal metastasis. Findings consistent bronchogenic carcinoma. PET staging favors T1c N3 M0 2. Hypermetabolic lymph node in the porta hepatis. Favor reactive adenopathy but cannot completely exclude nodal metastasis to the upper abdomen. Electronically Signed   By: Suzy Bouchard M.D.   On: 10/18/2018 08:15    Labs:  CBC: Recent Labs    11/12/17 1605 10/26/18 1458  WBC 5.9 5.9  HGB 13.0 10.8*  HCT 38.6* 32.4*  PLT 137* 180    COAGS: No results for input(s): INR, APTT in the last 8760 hours.  BMP: Recent Labs    11/12/17 1605 09/27/18 1103 10/26/18 1458  NA 132*  --  126*  K 3.6  --  3.9  CL 97*  --  92*  CO2 29  --  26  GLUCOSE 98  --  97  BUN 13  --  14  CALCIUM 9.2  --  8.8*  CREATININE 1.14 1.20 1.13  GFRNONAA 58*  --  60*  GFRAA >60  --  >60    LIVER FUNCTION TESTS: Recent Labs    10/26/18 1458  BILITOT 1.6*  AST 20  ALT 16  ALKPHOS 58  PROT 7.8  ALBUMIN 4.2    TUMOR MARKERS: No results for input(s): AFPTM, CEA, CA199, CHROMGRNA in the last 8760 hours.  Assessment and Plan:  Probable lung cancer +PET LLL mass Scheduled for tissue diagnosis per Dr Servando Snare Risks and benefits of CT guided lung nodule biopsy was discussed with the patient including, but not limited to bleeding, hemoptysis, respiratory failure requiring intubation, infection, pneumothorax requiring chest tube placement, stroke from air embolism or even death.  All of the patient's questions were answered and the patient is agreeable to proceed. Consent signed and in chart.   Thank you for this interesting consult.  I greatly enjoyed meeting SABRE LEONETTI and look forward to participating in their care.  A copy of this report was sent to the requesting provider on this  date.  Electronically Signed: Lavonia Drafts, PA-C 10/31/2018, 10:07 AM   I spent a total of  30 Minutes   in face to face in clinical  consultation, greater than 50% of which was counseling/coordinating care for LLL mass biopsy

## 2018-10-31 NOTE — Procedures (Signed)
Interventional Radiology Procedure:   Indications: Left lung nodule and chest lymphadenopathy  Procedure: CT guided left lung nodule biopsy.  Findings: Pleural based nodule in left lower lobe.  2 cores obtained.   Complications: None     EBL: less than 10 ml  Plan: CXR in 1 hour and discharge to home 2-3 hours if no complications.     Lewi Drost R. Anselm Pancoast, MD  Pager: [phone]

## 2018-10-31 NOTE — Discharge Instructions (Signed)
Needle Biopsy, Care After These instructions tell you how to care for yourself after your procedure. Your doctor may also give you more specific instructions. Call your doctor if you have any problems or questions. What can I expect after the procedure? After the procedure, it is common to have:  Soreness.  Bruising.  Mild pain. Follow these instructions at home:   Return to your normal activities as told by your doctor. Ask your doctor what activities are safe for you.  Take over-the-counter and prescription medicines only as told by your doctor.  Wash your hands with soap and water before you change your bandage (dressing). If you cannot use soap and water, use hand sanitizer.  Follow instructions from your doctor about: ? How to take care of your puncture site. ? When and how to change your bandage. ? When to remove your bandage.  Check your puncture site every day for signs of infection. Watch for: ? Redness, swelling, or pain. ? Fluid or blood. ? Pus or a bad smell. ? Warmth.  Do not take baths, swim, or use a hot tub until your doctor approves. Ask your doctor if you may take showers. You may only be allowed to take sponge baths.  Keep all follow-up visits as told by your doctor. This is important. Contact a doctor if you have:  A fever.  Redness, swelling, or pain at the puncture site, and it lasts longer than a few days.  Fluid, blood, or pus coming from the puncture site.  Warmth coming from the puncture site. Get help right away if:  You have a lot of bleeding from the puncture site. Summary  After the procedure, it is common to have soreness, bruising, or mild pain at the puncture site.  Check your puncture site every day for signs of infection, such as redness, swelling, or pain.  Get help right away if you have severe bleeding from your puncture site. This information is not intended to replace advice given to you by your health care provider. Make  sure you discuss any questions you have with your health care provider. Document Released: 01/30/2008 Document Revised: 03/01/2017 Document Reviewed: 03/01/2017 Elsevier Patient Education  2020 [employer].

## 2018-11-01 ENCOUNTER — Telehealth: Payer: Self-pay | Admitting: Cardiothoracic Surgery

## 2018-11-01 NOTE — Telephone Encounter (Signed)
      [address]       [place] [address]             [phone]      Patient's needle biopsy results were called and discussed with him, CT-guided needle biopsy of the left lower lobe lesion confirms squamous cell carcinoma.  Patient is to go for CT of the head on Wednesday, he has a follow-up appointment in the oncology office in [place].

## 2018-11-02 ENCOUNTER — Other Ambulatory Visit: Payer: Self-pay

## 2018-11-02 ENCOUNTER — Ambulatory Visit (HOSPITAL_COMMUNITY)
Admission: RE | Admit: 2018-11-02 | Discharge: 2018-11-02 | Disposition: A | Discharge status: Home / Self Care | Payer: Medicare Other | Source: Ambulatory Visit | Attending: Hematology | Admitting: Hematology

## 2018-11-02 DIAGNOSIS — R918 Other nonspecific abnormal finding of lung field: Secondary | ICD-10-CM | POA: Diagnosis not present

## 2018-11-02 MED ORDER — IOHEXOL 300 MG/ML  SOLN
75.0000 mL | Freq: Once | INTRAMUSCULAR | Status: AC | PRN
  Administered 2018-11-02: 75 mL via INTRAVENOUS

## 2018-11-09 ENCOUNTER — Encounter (HOSPITAL_COMMUNITY): Payer: Self-pay | Admitting: Hematology

## 2018-11-09 ENCOUNTER — Encounter (HOSPITAL_COMMUNITY): Payer: Self-pay | Admitting: Lab

## 2018-11-09 ENCOUNTER — Other Ambulatory Visit (HOSPITAL_COMMUNITY): Payer: Self-pay | Admitting: *Deleted

## 2018-11-09 ENCOUNTER — Other Ambulatory Visit: Payer: Self-pay

## 2018-11-09 ENCOUNTER — Inpatient Hospital Stay (HOSPITAL_COMMUNITY): Payer: Medicare Other | Attending: Hematology | Admitting: Hematology

## 2018-11-09 DIAGNOSIS — I252 Old myocardial infarction: Secondary | ICD-10-CM | POA: Insufficient documentation

## 2018-11-09 DIAGNOSIS — I129 Hypertensive chronic kidney disease with stage 1 through stage 4 chronic kidney disease, or unspecified chronic kidney disease: Secondary | ICD-10-CM | POA: Diagnosis not present

## 2018-11-09 DIAGNOSIS — R634 Abnormal weight loss: Secondary | ICD-10-CM | POA: Diagnosis not present

## 2018-11-09 DIAGNOSIS — Z79899 Other long term (current) drug therapy: Secondary | ICD-10-CM | POA: Diagnosis not present

## 2018-11-09 DIAGNOSIS — C3490 Malignant neoplasm of unspecified part of unspecified bronchus or lung: Secondary | ICD-10-CM | POA: Insufficient documentation

## 2018-11-09 DIAGNOSIS — Z7982 Long term (current) use of aspirin: Secondary | ICD-10-CM | POA: Diagnosis not present

## 2018-11-09 DIAGNOSIS — Z950 Presence of cardiac pacemaker: Secondary | ICD-10-CM | POA: Insufficient documentation

## 2018-11-09 DIAGNOSIS — K219 Gastro-esophageal reflux disease without esophagitis: Secondary | ICD-10-CM | POA: Insufficient documentation

## 2018-11-09 DIAGNOSIS — Z5111 Encounter for antineoplastic chemotherapy: Secondary | ICD-10-CM | POA: Insufficient documentation

## 2018-11-09 DIAGNOSIS — C3492 Malignant neoplasm of unspecified part of left bronchus or lung: Secondary | ICD-10-CM

## 2018-11-09 DIAGNOSIS — I2510 Atherosclerotic heart disease of native coronary artery without angina pectoris: Secondary | ICD-10-CM | POA: Diagnosis not present

## 2018-11-09 DIAGNOSIS — I480 Paroxysmal atrial fibrillation: Secondary | ICD-10-CM | POA: Diagnosis not present

## 2018-11-09 DIAGNOSIS — Z955 Presence of coronary angioplasty implant and graft: Secondary | ICD-10-CM | POA: Insufficient documentation

## 2018-11-09 DIAGNOSIS — N400 Enlarged prostate without lower urinary tract symptoms: Secondary | ICD-10-CM | POA: Insufficient documentation

## 2018-11-09 DIAGNOSIS — Z87891 Personal history of nicotine dependence: Secondary | ICD-10-CM | POA: Insufficient documentation

## 2018-11-09 DIAGNOSIS — Z7901 Long term (current) use of anticoagulants: Secondary | ICD-10-CM | POA: Diagnosis not present

## 2018-11-09 DIAGNOSIS — N182 Chronic kidney disease, stage 2 (mild): Secondary | ICD-10-CM | POA: Insufficient documentation

## 2018-11-09 DIAGNOSIS — E7800 Pure hypercholesterolemia, unspecified: Secondary | ICD-10-CM | POA: Diagnosis not present

## 2018-11-09 NOTE — Assessment & Plan Note (Signed)
1.  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  Hypermetabolic subcarinal lymph nodes present.  Hypermetabolic lymph node in the porta hepatis with SUV 4.9, measuring 1.1 cm favoring reactive adenopathy.  Close follow-up recommended. - 26 pound weight loss in the last 1 year. - Smoked 1/2 to 3 packs/day for 25 years, quit in 1975.  He worked on [employer] when he was in [employer] for 4-5 years. - Left lower lobe lung nodule biopsy on 10/31/2018 consistent with squamous cell carcinoma. - CT of the head on 11/02/2018 did not show any evidence of metastatic disease. - I had a prolonged discussion with the patient and his daughter about the findings on the scan and biopsy results.  I have recommended definitive concurrent chemoradiation therapy followed by durvalumab maintenance. - We talked about chemotherapy with carboplatin and paclitaxel weekly delivered throughout the course of radiation. -I called and talked to [facility] who kindly accepted to see this patient next week. -I also talked to him about need for port placement for chemotherapy administration.  We will make a referral to Dr. Runell Gess. Constance Haw.  He will hold Plavix until port placement.  We will plan to see him back in 1 to 2 weeks.  2.  Health maintenance: -25 sessile polyps in the descending colon, ascending colon and the cecum on colonoscopy done on 08/19/2017.  These were consistent with tubular adenomas.  3.  CAD: - He had MI x2, in 2010 and August 2019.  He had a pacemaker put in November 2018.  He is on aspirin and Plavix.  4.  Paroxysmal atrial fibrillation: -He is on Xarelto 20 mg daily.

## 2018-11-09 NOTE — Progress Notes (Signed)
Nicholas Sanchez, [place] [address]   CLINIC:  Medical Oncology/Hematology  PCP:  Sinda Du, [place] [address] [phone]   REASON FOR VISIT:  Follow-up for lung cancer.  CURRENT THERAPY: Recommended combination chemoradiation therapy.   CANCER STAGING: Cancer Staging Squamous cell carcinoma of lung, left (HCC) Staging form: Lung, AJCC 8th Edition - Clinical stage from 11/09/2018: Stage IIIB (cT1c, cN3, cM0) - Unsigned    INTERVAL HISTORY:  Nicholas Sanchez 85 y.o. male returns for follow-up after lung biopsy as well as CT scan of the head.  He lost about 25 pounds in the last 1 year.  He was an ex-smoker who quit smoking in 1975.  Appetite is reported as 50% and energy levels are low.  He does have some cough and shortness of breath.  Mild headaches reported.  He also complains of ringing in the ear when he has headaches.  He is currently holding his Plavix.    REVIEW OF SYSTEMS:  Review of Systems  Constitutional: Positive for fatigue.  HENT:   Positive for tinnitus.   Respiratory: Positive for cough and shortness of breath.   Neurological: Positive for headaches.  All other systems reviewed and are negative.    PAST MEDICAL/SURGICAL HISTORY:  Past Medical History:  Diagnosis Date   CAD (coronary artery disease)    a. STEMI 04/2008 s/p BMS to prox & distal RCA, staged DES to LAD same admission. b. Nuc 03/2017: scar but no ischemia, EF 45-54%. c. 09/2017: NSTEMI - DES x 2 to proximal and mid-RCA   CKD (chronic kidney disease), stage II    Cough, persistent    GERD (gastroesophageal reflux disease)    History of BPH    HTN (hypertension)    Hypercholesteremia    LV dysfunction    a. EF 45-50% in 01/2016.   Mild pulmonary hypertension (HCC)    PAF (paroxysmal atrial fibrillation) (HCC)    Presence of permanent cardiac pacemaker    Sinus node dysfunction ([place])    a. s/p StJude CRT-pacemaker  01/2016.   Past Surgical History:  Procedure Laterality Date   CARDIAC CATHETERIZATION     COLONOSCOPY     COLONOSCOPY N/A 08/27/2017   Dr. [name]: 25 5-25 mm polyps in descending colon, ascending, cecum. 2X3 cm carpet polyp in base of cecum lifted away s/p piecemeal polypectomy. Tubulovillous adenoma and tubular adenomas   CORONARY STENT INTERVENTION N/A 10/22/2017   Procedure: CORONARY STENT INTERVENTION;  Surgeon: [name], MD;  Location: [facility];  Service: Cardiovascular;  Laterality: N/A;   CORONARY STENT PLACEMENT  04/2008   RCA and LAD   EP IMPLANTABLE DEVICE N/A 01/09/2016   Procedure: BiV Pacemaker Insertion CRT-P;  Surgeon: [name], MD;  Location: [facility];  Service: Cardiovascular;  Laterality: N/A;   ESOPHAGOGASTRODUODENOSCOPY N/A 08/27/2017   normal esophagus s/p dilation, gastric petechia, small hiatal hernia, normal duodenum   INSERT / REPLACE / REMOVE PACEMAKER     MALONEY DILATION N/A 08/27/2017   Procedure: MALONEY DILATION;  Surgeon: [name], MD;  [facility];  Service: Endoscopy;  Laterality: N/A;   RIGHT/LEFT HEART CATH AND CORONARY ANGIOGRAPHY N/A 10/21/2017   Procedure: RIGHT/LEFT HEART CATH AND CORONARY ANGIOGRAPHY;  Surgeon: [name], MD;  Location: [facility];  Service: Cardiovascular;  Laterality: N/A;     SOCIAL HISTORY:  Social History   Socioeconomic History   Marital status:  Married    Spouse name: ester   Number of children: 3   Years of education: Not on file   Highest education level: Not on file  Occupational History   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   Food insecurity    Worry: Never true    Inability: Never true   Transportation needs    Medical: No    Non-medical: No  Tobacco Use   Smoking status: Former Smoker    Packs/day: 2.50    Years: 25.00    Pack years: 62.50    Types: Cigarettes     Quit date: 06/14/1973    Years since quitting: 45.4   Smokeless tobacco: Former Systems developer    Types: Chew    Quit date: 06/14/1973   Tobacco comment: some/chewed very little for about 2 years  Substance and Sexual Activity   Alcohol use: No   Drug use: No   Sexual activity: Not on file  Lifestyle   Physical activity    Days per week: 0 days    Minutes per session: 0 min   Stress: Not at all  Relationships   Social connections    Talks on phone: More than three times a week    Gets together: More than three times a week    Attends religious service: More than 4 times per year    Active member of club or organization: No    Attends meetings of clubs or organizations: Never    Relationship status: Married   Intimate partner violence    Fear of current or ex partner: No    Emotionally abused: No    Physically abused: No    Forced sexual activity: No  Other Topics Concern   Not on file  Social History Narrative   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     FAMILY HISTORY:  Family History  Problem Relation Age of Onset   Emphysema Mother        mother died with emphysema and cancer   Cancer Mother    Prostate cancer Brother    Heart disease Brother    Hypothyroidism Daughter    Hypothyroidism Daughter    Colon cancer Neg Hx    Colon polyps Neg Hx     CURRENT MEDICATIONS:  Outpatient Encounter Medications as of 11/09/2018  Medication Sig   acetaminophen (TYLENOL) 500 MG tablet Take 500 mg by mouth every 6 (six) hours as needed for pain.   aspirin EC 81 MG tablet Take 81 mg by mouth daily.   cholecalciferol (VITAMIN D) 1000 units tablet Take 1,000 Units by mouth 2 (two) times daily.   clopidogrel (PLAVIX) 75 MG tablet TAKE ONE (1) TABLET BY MOUTH EVERY DAY   dorzolamide-timolol (COSOPT) 22.3-6.8 MG/ML ophthalmic solution Place 1 drop into the left eye 2 (two) times daily.   ferrous sulfate 325 (65 FE) MG tablet Take 325 mg by mouth 3 (three) times  daily.   hydrochlorothiazide (HYDRODIURIL) 25 MG tablet TAKE ONE (1) TABLET BY MOUTH EVERY DAY (Patient taking differently: Take 25 mg by mouth daily. )   losartan (COZAAR) 25 MG tablet TAKE ONE TABLET BY MOUTY EVERY DAY (Patient taking differently: Take 25 mg by mouth daily. )   lovastatin (MEVACOR) 10 MG tablet TAKE ONE (1) TABLET BY MOUTH EVERY DAY   metoprolol succinate (TOPROL XL) 25 MG 24 hr tablet Take 1 tablet (25 mg total) by  mouth daily.   pantoprazole (PROTONIX) 40 MG tablet Take 40 mg by mouth daily.   Propylene Glycol (SYSTANE BALANCE OP) Place 1 drop into both eyes 2 (two) times daily as needed (dry eyes).    rivaroxaban (XARELTO) 20 MG TABS tablet Take 1 tablet (20 mg total) by mouth daily with supper. Dx: paroxysmal atrial fibrillation   triamcinolone cream (KENALOG) 0.1 % Apply 1 application topically daily as needed (itching).    vitamin B-12 (CYANOCOBALAMIN) 1000 MCG tablet Take 1,000 mcg by mouth daily.   nitroGLYCERIN (NITROSTAT) 0.4 MG SL tablet Place 1 tablet (0.4 mg total) under the tongue every 5 (five) minutes x 3 doses as needed for chest pain. (Patient not taking: Reported on 10/26/2018)   No facility-administered encounter medications on file as of 11/09/2018.     ALLERGIES:  Allergies  Allergen Reactions   Ace Inhibitors Nausea Only   Tape Rash    And Bandaids, too     PHYSICAL EXAM:  ECOG Performance status: 1  Vitals:   11/09/18 1119  BP: (!) 137/56  Pulse: 71  Resp: 16  Temp: (!) 97.5 F (36.4 C)  SpO2: 98%   Filed Weights   11/09/18 1119  Weight: 200 lb 1.6 oz (90.8 kg)    Physical Exam Vitals signs reviewed.  Constitutional:      Appearance: Normal appearance.  Cardiovascular:     Rate and Rhythm: Normal rate and regular rhythm.     Heart sounds: Normal heart sounds.  Pulmonary:     Effort: Pulmonary effort is normal.     Breath sounds: Normal breath sounds.  Abdominal:     General: There is no distension.      Palpations: Abdomen is soft. There is no mass.  Musculoskeletal:        General: No swelling.  Skin:    General: Skin is warm.  Neurological:     General: No focal deficit present.     Mental Status: He is alert and oriented to person, place, and time.  Psychiatric:        Mood and Affect: Mood normal.        Behavior: Behavior normal.      LABORATORY DATA:  I have reviewed the labs as listed.  CBC    Component Value Date/Time   WBC 6.6 10/31/2018 0942   RBC 3.69 (L) 10/31/2018 0942   HGB 11.8 (L) 10/31/2018 0942   HCT 35.7 (L) 10/31/2018 0942   PLT 207 10/31/2018 0942   MCV 96.7 10/31/2018 0942   MCH 32.0 10/31/2018 0942   MCHC 33.1 10/31/2018 0942   RDW 13.8 10/31/2018 0942   LYMPHSABS 0.7 10/26/2018 1458   MONOABS 0.5 10/26/2018 1458   EOSABS 0.5 10/26/2018 1458   BASOSABS 0.0 10/26/2018 1458   CMP Latest Ref Rng & Units 10/26/2018 09/27/2018 11/12/2017  Glucose 70 - 99 mg/dL 97 - 98  BUN 8 - 23 mg/dL 14 - 13  Creatinine 0.61 - 1.24 mg/dL 1.13 1.20 1.14  Sodium 135 - 145 mmol/L 126(L) - 132(L)  Potassium 3.5 - 5.1 mmol/L 3.9 - 3.6  Chloride 98 - 111 mmol/L 92(L) - 97(L)  CO2 22 - 32 mmol/L 26 - 29  Calcium 8.9 - 10.3 mg/dL 8.8(L) - 9.2  Total Protein 6.5 - 8.1 g/dL 7.8 - -  Total Bilirubin 0.3 - 1.2 mg/dL 1.6(H) - -  Alkaline Phos 38 - 126 U/L 58 - -  AST 15 - 41 U/L 20 - -  ALT  0 - 44 U/L 16 - -       DIAGNOSTIC IMAGING:  I have independently reviewed the scans and discussed with the patient.    ASSESSMENT & PLAN:   Squamous cell carcinoma of lung, left ([place]) 1.  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  Hypermetabolic subcarinal lymph nodes present.  Hypermetabolic lymph node in the porta hepatis with SUV 4.9, measuring 1.1 cm favoring reactive adenopathy.  Close  follow-up recommended. - 26 pound weight loss in the last 1 year. - Smoked 1/2 to 3 packs/day for 25 years, quit in 1975.  He worked on [employer] when he was in [place] for 4-5 years. - Left lower lobe lung nodule biopsy on 10/31/2018 consistent with squamous cell carcinoma. - CT of the head on 11/02/2018 did not show any evidence of metastatic disease. - I had a prolonged discussion with the patient and his daughter about the findings on the scan and biopsy results.  I have recommended definitive concurrent chemoradiation therapy followed by durvalumab maintenance. - We talked about chemotherapy with carboplatin and paclitaxel weekly delivered throughout the course of radiation. -I called and talked to Allen who kindly accepted to see this patient next week. -I also talked to him about need for port placement for chemotherapy administration.  We will make a referral to Dr. Runell Gess. Constance Haw.  He will hold Plavix until port placement.  We will plan to see him back in 1 to 2 weeks.  2.  Health maintenance: -25 sessile polyps in the descending colon, ascending colon and the cecum on colonoscopy done on 08/19/2017.  These were consistent with tubular adenomas.  3.  CAD: - He had MI x2, in 2010 and August 2019.  He had a pacemaker put in November 2018.  He is on aspirin and Plavix.  4.  Paroxysmal atrial fibrillation: -He is on Xarelto 20 mg daily.  Total time spent is 40 minutes with more than 50% of the time spent face-to-face discussing scan results, biopsy results, treatment plan, counseling and coordination of care.    Orders placed this encounter:  No orders of the defined types were placed in this encounter.     Derek Jack, MD [place] [phone]

## 2018-11-09 NOTE — Progress Notes (Signed)
Faxed referral to [facility] for xrt.  Fax transmission confirmation received.

## 2018-11-09 NOTE — Patient Instructions (Addendum)
[place] at [facility] Discharge Instructions  You were seen today by Dr. Delton Coombes. He talked to you about your recent test results and biopsy.  He talked to you about your staging.  You have stage III cancer of the lung.  He presented to you to get chemotherapy and radiation for 6 weeks. You will get radiation daily (5 days per week) for 6 weeks and you will only get chemotherapy once per week for the 6 weeks.  You will be getting carboplatin and paclitaxel.  We will do more scans at that time and then switch you to immunotherapy.  The immunotherapy will be one day per week every 2 weeks.  We will talk to you more about the immunotherapy at that time.    To safely give you the chemotherapy we have to put in a port a cath.  It will be place by a surgeon. We will refer you to Dr. Arnoldo Morale or Dr. Constance Haw here in town.  They will see you in the office and then set you up for surgery.    You can restart your Plavix after you have your surgery for port placement  Thank you for choosing [place] at [facility] to provide your oncology and hematology care.  To afford each patient quality time with our provider, please arrive at least 15 minutes before your scheduled appointment time.   If you have a lab appointment with the [facility] please come in thru the Main Entrance and check in at the main information desk.  You need to re-schedule your appointment should you arrive 10 or more minutes late.  We strive to give you quality time with our providers, and arriving late affects you and other patients whose appointments are after yours.  Also, if you no show three or more times for appointments you may be dismissed from the clinic at the providers discretion.     Again, thank you for choosing [facility].  Our hope is that these requests will decrease the amount of time that you wait before being seen by our physicians.        _____________________________________________________________  Should you have questions after your visit to [facility], please contact our office at [phone] between the hours of 8:00 a.m. and 4:30 p.m.  Voicemails left after 4:00 p.m. will not be returned until the following business day.  For prescription refill requests, have your pharmacy contact our office and allow 72 hours.    Due to Covid, you will need to wear a mask upon entering the hospital. If you do not have a mask, a mask will be given to you at the Main Entrance upon arrival. For doctor visits, patients may have 1 support person with them. For treatment visits, patients can not have anyone with them due to social distancing guidelines and our immunocompromised population.

## 2018-11-09 NOTE — Progress Notes (Signed)
Oncology navigator note: I met with patient and his daughter during the visit with Dr. Delton Coombes. I provided my contact information to them and advised of my role in his care.  We talked about the recommendations Dr. Delton Coombes presented.  I advised him to call me should they have any questions or concerns. He verbalizes understanding.

## 2018-11-10 ENCOUNTER — Telehealth: Payer: Self-pay | Admitting: Internal Medicine

## 2018-11-10 ENCOUNTER — Other Ambulatory Visit: Payer: Self-pay | Admitting: *Deleted

## 2018-11-10 DIAGNOSIS — Z510 Encounter for antineoplastic radiation therapy: Secondary | ICD-10-CM | POA: Diagnosis not present

## 2018-11-10 DIAGNOSIS — Z950 Presence of cardiac pacemaker: Secondary | ICD-10-CM | POA: Diagnosis not present

## 2018-11-10 DIAGNOSIS — C3432 Malignant neoplasm of lower lobe, left bronchus or lung: Secondary | ICD-10-CM | POA: Diagnosis not present

## 2018-11-10 NOTE — Telephone Encounter (Signed)
LMOM for [place] to call back to clarify info.

## 2018-11-10 NOTE — Telephone Encounter (Signed)
Dr Lovena Le given info and contact number for DR Francesca Jewett.

## 2018-11-10 NOTE — Telephone Encounter (Signed)
[place] with [facility] in [place] advised to fax [place] a request form for the pt that is having a consult with them re: receiving radiation and his Pacemaker. She will fax the form/ clearance to [phone].   Her number is [phone]  Dr. Francesca Jewett.   Will forward to Dr. Lovena Le for his review.

## 2018-11-10 NOTE — Telephone Encounter (Signed)
[place] with [facility] is calling to obtain patient device information since he is there for radiation today.

## 2018-11-10 NOTE — Progress Notes (Signed)
The proposed treatment discussed in cancer conference 9/10 is for discussion purpose only and is not a binding recommendation.  The patient was not physically examined nor present for their treatment options.  Therefore, final treatment  plans cannot be decided.   Will update Nurse Navigator at [facility] on recommendaitons

## 2018-11-10 NOTE — Telephone Encounter (Signed)
[place] Therapist, sports from [facility] given PPM info. Patient is to have radiation of LLL of lung and possibly  lymph nodes in area of left chest and axillary region to start 11/21/18. Patient needs clearance and no form available to fax for clearance. DR Francesca Jewett  will be doing treatment and would like for Dr Lovena Le to contact him ASAP  at [phone] to discuss clearance before patient has  CT simulation for radiation planning 11/11/18 @ 0800 am.

## 2018-11-11 ENCOUNTER — Encounter (HOSPITAL_COMMUNITY): Payer: Self-pay | Admitting: *Deleted

## 2018-11-11 DIAGNOSIS — C3412 Malignant neoplasm of upper lobe, left bronchus or lung: Secondary | ICD-10-CM | POA: Diagnosis not present

## 2018-11-11 DIAGNOSIS — Z510 Encounter for antineoplastic radiation therapy: Secondary | ICD-10-CM | POA: Diagnosis not present

## 2018-11-11 DIAGNOSIS — Z950 Presence of cardiac pacemaker: Secondary | ICD-10-CM | POA: Diagnosis not present

## 2018-11-11 DIAGNOSIS — C3432 Malignant neoplasm of lower lobe, left bronchus or lung: Secondary | ICD-10-CM | POA: Diagnosis not present

## 2018-11-11 NOTE — Progress Notes (Signed)
I emailed pathology and ordered foundation one and PDL-1 on accession # [id number] stage IIIB and Dx: E33.43

## 2018-11-11 NOTE — Patient Instructions (Addendum)
[facility] Chemotherapy Teaching   You have been diagnosed with Stage III squamous cell lung cancer.  You will be treated weekly with paclitaxel (Taxol) and carboplatin in conjunction with radiation.  Upon completion of chemotherapy and radiation, you will receive an immunotherapy drug, durvalumab (Imfinzi), every two weeks as maintenance therapy.  The intent of treatment is to control your cancer and keep it from spreading further, and to help alleviate any symptoms you may be having related to your cancer.  You will see the doctor regularly throughout treatment.  We will obtain blood work from you prior to every treatment and monitor your results to make sure it is safe to give your treatment. The doctor monitors your response to treatment by the way you are feeling, your blood work, and by obtaining scans periodically.  There will be wait times while you are here for treatment.  It will take about 30 minutes to 1 hour for your lab work to result.  Then there will be wait times while pharmacy mixes your medications.   Medications you will receive in the clinic prior to your chemotherapy medications:  Aloxi:  ALOXI is used in adults to help prevent the nausea and vomiting that happens with certain anti-cancer medicines (chemotherapy).  Aloxi is a long acting medication, and will remain in your system for 24-36 hours.   Emend:  This is an anti-nausea medication that is used with Aloxi to help prevent nausea and vomiting caused by chemotherapy.  Dexamethasone:  This is a steroid given prior to chemotherapy to help prevent allergic reactions; it may also help prevent and control nausea and diarrhea.   Pepcid:  This medication is a histamine blocker that helps prevent and allergic reaction to your chemotherapy.   Benadryl:  This is a histamine blocker (different from the Pepcid) that helps prevent allergic/infusion reactions to your chemotherapy. This medication may cause  dizziness/drowsiness.    Paclitaxel (Taxol)  About This Drug Paclitaxel is a drug used to treat cancer. It is given in the vein (IV).  This will take 1 hour to infuse.  This first infusion will take longer to infuse because it is increased slowly to monitor for reactions.  The nurse will be in the room with you for the first 15 minutes of the first infusion.  Possible Side Effects  . Hair loss. Hair loss is often temporary, although with certain medicine, hair loss can sometimes be permanent. Hair loss may happen suddenly or gradually. If you lose hair, you may lose it from your head, face, armpits, pubic area, chest, and/or legs. You may also notice your hair getting thin.  . Swelling of your legs, ankles and/or feet (edema)  . Flushing  . Nausea and throwing up (vomiting)  . Loose bowel movements (diarrhea)  . Bone marrow depression. This is a decrease in the number of white blood cells, red blood cells, and platelets. This may raise your risk of infection, make you tired and weak (fatigue), and raise your risk of bleeding.  . Effects on the nerves are called peripheral neuropathy. You may feel numbness, tingling, or pain in your hands and feet. It may be hard for you to button your clothes, open jars, or walk as usual. The effect on the nerves may get worse with more doses of the drug. These effects get better in some people after the drug is stopped but it does not get better in all people.  . Changes in your liver function  .  Bone, joint and muscle pain  . Abnormal EKG  . Allergic reaction: Allergic reactions, including anaphylaxis are rare but may happen in some patients. Signs of allergic reaction to this drug may be swelling of the face, feeling like your tongue or throat are swelling, trouble breathing, rash, itching, fever, chills, feeling dizzy, and/or feeling that your heart is beating in a fast or not normal way. If this happens, do not take another dose of this drug. You  should get urgent medical treatment.  . Infection  . Changes in your kidney function.  Note: Each of the side effects above was reported in 20% or greater of patients treated with paclitaxel. Not all possible side effects are included above.  Warnings and Precautions  . Severe allergic reactions  . Severe bone marrow depression  Treating Side Effects  . To help with hair loss, wash with a mild shampoo and avoid washing your hair every day.  . Avoid rubbing your scalp, instead, pat your hair or scalp dry  . Avoid coloring your hair  . Limit your use of hair spray, electric curlers, blow dryers, and curling irons.  . If you are interested in getting a wig, talk to your nurse. You can also call the [employer] at 800-ACS-2345 to find out information about the "Look Good, Feel Better" program close to where you live. It is a free program where women getting chemotherapy can learn about wigs, turbans and scarves as well as makeup techniques and skin and nail care.  . Ask your doctor or nurse about medicines that are available to help stop or lessen diarrhea and/or nausea.  . To help with nausea and vomiting, eat small, frequent meals instead of three large meals a day. Choose foods and drinks that are at room temperature. Ask your nurse or doctor about other helpful tips and medicine that is available to help or stop lessen these symptoms.  . If you get diarrhea, eat low-fiber foods that are high in protein and calories and avoid foods that can irritate your digestive tracts or lead to cramping. Ask your nurse or doctor about medicine that can lessen or stop your diarrhea.  . Mouth care is very important. Your mouth care should consist of routine, gentle cleaning of your teeth or dentures and rinsing your mouth with a mixture of 1/2 teaspoon of salt in 8 ounces of water or  teaspoon of baking soda in 8 ounces of water. This should be done at least after each meal and at  bedtime.  . If you have mouth sores, avoid mouthwash that has alcohol. Also avoid alcohol and smoking because they can bother your mouth and throat.  . Drink plenty of fluids (a minimum of eight glasses per day is recommended).  . Take your temperature as your doctor or nurse tells you, and whenever you feel like you may have a fever.  . Talk to your doctor or nurse about precautions you can take to avoid infections and bleeding.  . Be careful when cooking, walking, and handling sharp objects and hot liquids.  Food and Drug Interactions  . There are no known interactions of paclitaxel with food.  . This drug may interact with other medicines. Tell your doctor and pharmacist about all the medicines and dietary supplements (vitamins, minerals, herbs and others) that you are taking at this time.  . The safety and use of dietary supplements and alternative diets are often not known. Using these might affect your cancer  or interfere with your treatment. Until more is known, you should not use dietary supplements or alternative diets without your cancer doctor's help.  When to Call the Doctor  Call your doctor or nurse if you have any of the following symptoms and/or any new or unusual symptoms:  . Fever of 100.5 F (38 C) or above  . Chills  . Redness, pain, warmth, or swelling at the IV site during the infusion  . Signs of allergic reaction: swelling of the face, feeling like your tongue or throat are swelling, trouble breathing, rash, itching, fever, chills, feeling dizzy, and/or feeling that your heart is beating in a fast or not normal way  . Feeling that your heart is beating in a fast or not normal way (palpitations)  . Weight gain of 5 pounds in one week (fluid retention)  . Decreased urine or very dark urine  . Signs of liver problems: dark urine, pale bowel movements, bad stomach pain, feeling very tired and weak, unusual  itching, or yellowing of the eyes or skin  .  Heavy menstrual period that lasts longer than normal  . Easy bruising or bleeding  . Nausea that stops you from eating or drinking, and/or that is not relieved by prescribed medicines.  . Loose bowel movements (diarrhea) more than 4 times a day or diarrhea with weakness or lightheadedness  . Pain in your mouth or throat that makes it hard to eat or drink  . Lasting loss of appetite or rapid weight loss of five pounds in a week  . Signs of peripheral neuropathy: numbness, tingling, or decreased feeling in fingers or toes; trouble walking or changes in the way you walk; or feeling clumsy when buttoning clothes, opening jars, or other routine activities  . Joint and muscle pain that is not relieved by prescribed medicines  . Extreme fatigue that interferes with normal activities  . While you are getting this drug, please tell your nurse right away if you have any pain, redness, or swelling at the site of the IV infusion.  . If you think you are pregnant.  Reproduction Warnings  . Pregnancy warning: This drug may have harmful effects on the unborn child, it is recommended that effective methods of birth control should be used during your cancer treatment. Let your doctor know right away if you think you may be pregnant.  . Breast feeding warning: Women should not breast feed during treatment because this drug could enter the breastmilk and cause harm to a breast feeding baby.   Carboplatin (Paraplatin, CBDCA)  About This Drug Carboplatin is used to treat cancer. It is given in the vein through your port a cath.  It will take 30 minutes to infuse. You will receive this medication on Days 1-4 (Monday-Thursday) of each cycle.    Possible Side Effects . Bone marrow suppression. This is a decrease in the number of white blood cells, red blood cells, and platelets. This may raise your risk of infection, make you tired and weak (fatigue), and raise your risk of bleeding. . Nausea and  vomiting (throwing up) . Weakness . Changes in your liver function . Changes in your kidney function . Electrolyte changes . Pain  Note: Each of the side effects above was reported in 20% or greater of patients treated with carboplatin. Not all possible side effects are included above.  Warnings and Precautions . Severe bone marrow suppression . Allergic reactions, including anaphylaxis are rare but may happen in some patients.  Signs of allergic reaction to this drug may be swelling of the face, feeling like your tongue or throat are swelling, trouble breathing, rash, itching, fever, chills, feeling dizzy, and/or feeling that your heart is beating in a fast or not normal way. If this happens, do not take another dose of this drug. You should get urgent medical treatment. . Severe nausea and vomiting . Effects on the nerves are called peripheral neuropathy. This risk is increased if you are over the age of 76 or if you have received other medicine with risk of peripheral neuropathy. You may feel numbness, tingling, or pain in your hands and feet. It may be hard for you to button your clothes, open jars, or walk as usual. The effect on the nerves may get worse with more doses of the drug. These effects get better in some people after the drug is stopped but it does not get better in all people. [name] Blurred vision, loss of vision or other changes in eyesight . Decreased hearing . Skin and tissue irritation including redness, pain, warmth, or swelling at the IV site if the drug leaks out of the vein and into nearby tissue. . Severe changes in your kidney function, which can cause kidney failure . Severe changes in your liver function, which can cause liver failure  Note: Some of the side effects above are very rare. If you have concerns and/or questions, please discuss them with your medical team.  Important Information . This drug may be present in the saliva, tears, sweat, urine,  stool, vomit, semen, and vaginal secretions. Talk to your doctor and/or your nurse about the necessary precautions to take during this time.  Treating Side Effects . Manage tiredness by pacing your activities for the day. . Be sure to include periods of rest between energy-draining activities. . To decrease the risk of infection, wash your hands regularly. . Avoid close contact with people who have a cold, the flu, or other infections. . Take your temperature as your doctor or nurse tells you, and whenever you feel like you may have a fever. . To help decrease the risk of bleeding, use a soft toothbrush. Check with your nurse before using dental floss. . Be very careful when using knives or tools. . Use an electric shaver instead of a razor. . Drink plenty of fluids (a minimum of eight glasses per day is recommended). . If you throw up or have loose bowel movements, you should drink more fluids so that you do not become dehydrated (lack of water in the body from losing too much fluid). . To help with nausea and vomiting, eat small, frequent meals instead of three large meals a day. Choose foods and drinks that are at room temperature. Ask your nurse or doctor about other helpful tips and medicine that is available to help stop or lessen these symptoms. . If you have numbness and tingling in your hands and feet, be careful when cooking, walking, and handling sharp objects and hot liquids. [name] Keeping your pain under control is important to your well-being. Please tell your doctor or nurse if you are experiencing pain.  Food and Drug Interactions . There are no known interactions of carboplatin with food. . This drug may interact with other medicines. Tell your doctor and pharmacist about all the prescription and over-the-counter medicines and dietary supplements (vitamins, minerals, herbs and others) that you are taking at this time. Also, check with your doctor or pharmacist  before starting any  new prescription or over-the-counter medicines, or dietary supplements to make sure that there are no interactions.  When to Call the Doctor Call your doctor or nurse if you have any of these symptoms and/or any new or unusual symptoms: . Fever of 100.4 F (38 C) or higher . Chills . Tiredness that interferes with your daily activities . Feeling dizzy or lightheaded . Easy bleeding or bruising . Nausea that stops you from eating or drinking and/or is not relieved by prescribed medicines . Throwing up more than 3 times a day . Blurred vision or other changes in eyesight . Decrease in hearing or ringing in the ear . Signs of allergic reaction: swelling of the face, feeling like your tongue or throat are swelling, trouble breathing, rash, itching, fever, chills, feeling dizzy, and/or feeling that your heart is beating in a fast or not normal way. If this happens, call 911 for emergency care. . While you are getting this drug, please tell your nurse right away if you have any pain, redness, or swelling at the site of the IV infusion . Signs of possible liver problems: dark urine, pale bowel movements, bad stomach pain, feeling very tired and weak, unusual itching, or yellowing of the eyes or skin . Decreased urine, or very dark urine . Numbness, tingling, or pain in your hands and feet . Pain that does not go away or is not relieved by prescribed medicine . If you think you may be pregnant  Reproduction Warnings . Pregnancy warning: This drug may have harmful effects on the unborn baby. Women of child bearing potential should use effective methods of birth control during your cancer treatment. Let your doctor know right away if you think you may be pregnant. . Breastfeeding warning: It is not known if this drug passes into breast milk. For this reason, women should not breastfeed during treatment because this drug could enter the breast milk and cause harm to a  breastfeeding baby. . Fertility warning: Human fertility studies have not been done with this drug. Talk with your doctor or nurse if you plan to have children. Ask for information on sperm or egg banking.  Durvalumab        ([id number])  Trade Name(s): Imfinzi  You will receive durvalumab (Imfinzi) through your port a cath once every three weeks. You will be monitored for an infusion reaction which is a rare occurrence.  Side Effects  Important things to remember about the side effects of durvalumab: Most people will not experience all of the durvalumab side effects listed. Side effects are often predictable in terms of their onset, duration, and severity. Side effects are almost always reversible and will go away after therapy is complete. Side effects may be quite manageable. There are many options to minimize or prevent side effects of durvalumab.  The following side effects are common (occurring in greater than 30%) for patients taking durvalumab:  Fatigue Infection  These are less common side effects (occurring in 10-29%) for patients receiving durvalumab:  Muscle and/or bone pain Constipation Decreased appetite Rash Nausea Swelling Urinary tract infections Abdominal pain Fever Colitis Diarrhea Decreased sodium level Decreased lymphocyte count (a type of white blood cell)  The following are rare but serious complications of durvalumab therapy triggered by an auto-immune reaction where the immune system goes after normal cells in the body. This can happen at any time while taking, and/or after stopping durvalumab. Contact your healthcare provider immediately if you have signs/symptoms of the following:  Pneumonitis (  lung problems) identified by:  New or worsening cough Shortness of breath Chest pain  Hepatitis (liver problems) identified by:  Yellowing of your skin or the whites of your eyes Severe nausea and vomiting Pain on the right side of your  stomach Dark, tea colored urine Bleeding or bruising more easily than normal  Colitis (intestinal problems) identified by:  Diarrhea Blood in your stools or dark, tarry stool Severe stomach pain or tenderness  Hormone gland problems (thyroid gland, adrenal gland, and pancreas) identified by:  Rapid heart beat Increased sweating Extreme tiredness Unexpected weight gain or loss Feeling more hungry or thirsty High blood sugar Hair loss Irritability or forgetfulness Constipation Deepening of your voice Low blood pressure More frequent urination Stomach pain Kidney problems identified by: Less frequent urination Blood in your urine Ankle swelling Loss of appetite  Other organ problems:  Headache, change in balance, confusion Severe muscle weakness or pain Chest pain and tightness Trouble breathing Skin rash Change in heartbeat Flu like symptoms  Not all side effects are listed above. Side effects that are very rare -- occurring in less than about 10 percent of patients -- are not listed here. But you should always inform your health care provider if you experience any unusual symptoms.  When to Contact Your Doctor or [facility] Provider  Contact your health care provider immediately, day or night, if you should experience any of the following symptoms:  Fever of 100.4 F (38 C) or higher, chills (possible signs of infection) Shortness of breath, cough Confusion, imbalance  The following symptoms require medical attention, but are not an emergency. Contact your health care provider within 24 hours of noticing any of the following:  Nausea (interferes with ability to eat and unrelieved with prescribed medication) Vomiting (vomiting more than 4-5 times in a 24-hour period) Diarrhea (4-6 episodes in a 24-hour period) Unusual bleeding or bruising Black or tarry stools, or blood in your stools Blood in the urine Pain or burning with urination Extreme fatigue (unable  to carry on self-care activities) Yellowing of skin or eyes Constipation unrelieved by laxatives use. Always inform your health care provider if you experience any unusual symptoms.  Precautions  Before starting durvalumab treatment, make sure you tell your doctor about any other medications you are taking (including prescription, over-the-counter, vitamins, herbal remedies, etc.).  Do not receive any kind of immunization or vaccination without your doctor's approval while taking durvalumab. Inform your health care professional if you are pregnant or may be pregnant prior to starting this treatment.   Pregnancy category X (Durvalumab may cause fetal harm when given to a pregnant woman. This drug must not be given to a pregnant woman or a woman who intends to become pregnant. If a woman becomes pregnant while taking durvalumab, the medication must be stopped immediately and the woman given appropriate counseling.  For both men and women: Use contraceptives, and do not conceive a child (get pregnant) while taking durvalumab. Barrier methods of contraception, such as condoms, are recommended for up to 3 months after last dose of durvalumab.  Do not breast feed while taking durvalumab or for at least 3 months after last dose of durvalumab.  Self-Care Tips  Drink at least two to three quarts of fluid every 24 hours, unless you are instructed otherwise. You may be at risk of infection so try to avoid crowds or people with colds, and report fever or any other signs of infection immediately to your health care provider. Wash your  hands often. To reduce nausea, take anti-nausea medication as prescribed by your doctor, and eat small, frequent meals. Follow regimen of anti-diarrhea medication as prescribed by your health care professional. Eat foods that may help reduce diarrhea (see managing side effects - diarrhea). In general, drinking alcoholic beverages should be kept to a minimum or avoided  completely. You should discuss this with your doctor. Get plenty of rest. Maintain good nutrition. Remain active as you are able. Gentle exercise is encouraged such as a daily walk.  If you experience symptoms or side effects, be sure to discuss them with your health care team. They can prescribe medications and/or offer other suggestions that are effective in managing such problems.  Monitoring and Testing While Taking Durvalumab  You will be checked regularly by your doctor while you are taking durvalumab to monitor side effects and check your response to therapy. Periodic blood work will be obtained to monitor your complete blood count (CBC), glucose, as well as the function of other organs (such as your kidneys, liver, thyroid) will also be ordered by your doctor.  How Durvalumab Works  Monoclonal antibodies are a relatively new type of "targeted" cancer therapy. Antibodies are an important part of the body's immune system. Normally, antibodies are produced by the body in response to an antigen, such as protein in a germ that the body recognizes as foreign. The antibodies attach to the antigen in order to mark it for destruction by the immune system.  Since monoclonal antibodies target only specific cells, they generally cause less toxicity to healthy cells. A limitation to monoclonal antibody use is that they can only be used for cancer in which antigens (and antibodies that bind them) have been identified.  Programmed death ligand 1 (PD-L1) is a specific protein (an immune check point protein) on tumor cells that binds to programmed death 1 (PD-1) on our body's anti-tumor t-cells which stops our body's t-cells from attacking the tumor cells. Durvalumab is a human immunoglobulin monoclonal antibody in a class called check point inhibitors. Durvalumab blocks PD-L1 on tumor cells from binding to PD-1 and CD80 on our t-cells. This allows for increased t-cell activation, which then frees up our  antitumor t-cells and allows them to attack the cancer cells.   SELF CARE ACTIVITIES WHILE RECEIVING CHEMOTHERAPY:  Hydration Increase your fluid intake 48 hours prior to treatment and drink at least 8 to 12 cups (64 ounces) of water/decaffeinated beverages per day after treatment. You can still have your cup of coffee or soda but these beverages do not count as part of your 8 to 12 cups that you need to drink daily. No alcohol intake.  Medications Continue taking your normal prescription medication as prescribed.  If you start any new herbal or new supplements please let us know first to make sure it is safe.  Mouth Care Have teeth cleaned professionally before starting treatment. Keep dentures and partial plates clean. Use soft toothbrush and do not use mouthwashes that contain alcohol. Biotene is a good mouthwash that is available at most pharmacies or may be ordered by calling [phone]. Use warm salt water gargles (1 teaspoon salt per 1 quart warm water) before and after meals and at bedtime. If you need dental work, please let the doctor know before you go for your appointment so that we can coordinate the best possible time for you in regards to your chemo regimen. You need to also let your dentist know that you are actively taking chemo. We  may need to do labs prior to your dental appointment.  Skin Care Always use sunscreen that has not expired and with SPF (Sun Protection Factor) of 50 or higher. Wear hats to protect your head from the sun. Remember to use sunscreen on your hands, ears, face, & feet.  Use good moisturizing lotions such as udder cream, eucerin, or even Vaseline. Some chemotherapies can cause dry skin, color changes in your skin and nails.    . Avoid long, hot showers or baths. . Use gentle, fragrance-free soaps and laundry detergent. . Use moisturizers, preferably creams or ointments rather than lotions because the thicker consistency is better at preventing skin  dehydration. Apply the cream or ointment within 15 minutes of showering. Reapply moisturizer at night, and moisturize your hands every time after you wash them.  Hair Loss (if your doctor says your hair will fall out)  . If your doctor says that your hair is likely to fall out, decide before you begin chemo whether you want to wear a wig. You may want to shop before treatment to match your hair color. . Hats, turbans, and scarves can also camouflage hair loss, although some people prefer to leave their heads uncovered. If you go bare-headed outdoors, be sure to use sunscreen on your scalp. . Cut your hair short. It eases the inconvenience of shedding lots of hair, but it also can reduce the emotional impact of watching your hair fall out. . Don't perm or color your hair during chemotherapy. Those chemical treatments are already damaging to hair and can enhance hair loss. Once your chemo treatments are done and your hair has grown back, it's OK to resume dyeing or perming hair.  With chemotherapy, hair loss is almost always temporary. But when it grows back, it may be a different color or texture. In older adults who still had hair color before chemotherapy, the new growth may be completely gray.  Often, new hair is very fine and soft.  Infection Prevention Please wash your hands for at least 30 seconds using warm soapy water. Handwashing is the #1 way to prevent the spread of germs. Stay away from sick people or people who are getting over a cold. If you develop respiratory systems such as green/yellow mucus production or productive cough or persistent cough let us know and we will see if you need an antibiotic. It is a good idea to keep a pair of gloves on when going into grocery stores/[employer] to decrease your risk of coming into contact with germs on the carts, etc. Carry alcohol hand gel with you at all times and use it frequently if out in public. If your temperature reaches 100.5 or higher please  call the clinic and let us know.  If it is after hours or on the weekend please go to the ER if your temperature is over 100.5.  Please have your own personal thermometer at home to use.    Sex and bodily fluids If you are going to have sex, a condom must be used to protect the person that isn't taking chemotherapy. Chemo can decrease your libido (sex drive). For a few days after chemotherapy, chemotherapy can be excreted through your bodily fluids.  When using the toilet please close the lid and flush the toilet twice.  Do this for a few day after you have had chemotherapy.   Effects of chemotherapy on your sex life Some changes are simple and won't last long. They won't affect your sex life  permanently.  Sometimes you may feel: . too tired . not strong enough to be very active . sick or sore  . not in the mood . anxious or low Your anxiety might not seem related to sex. For example, you may be worried about the cancer and how your treatment is going. Or you may be worried about money, or about how you family are coping with your illness. These things can cause stress, which can affect your interest in sex. It's important to talk to your partner about how you feel. Remember - the changes to your sex life don't usually last long. There's usually no medical reason to stop having sex during chemo. The drugs won't have any long term physical effects on your performance or enjoyment of sex. Cancer can't be passed on to your partner during sex  Contraception It's important to use reliable contraception during treatment. Avoid getting pregnant while you or your partner are having chemotherapy. This is because the drugs may harm the baby. Sometimes chemotherapy drugs can leave a man or woman infertile.  This means you would not be able to have children in the future. You might want to talk to someone about permanent infertility. It can be very difficult to learn that you may no longer be able to have  children. Some people find counselling helpful. There might be ways to preserve your fertility, although this is easier for men than for women. You may want to speak to a fertility expert. You can talk about sperm banking or harvesting your eggs. You can also ask about other fertility options, such as donor eggs. If you have or have had breast cancer, your doctor might advise you not to take the contraceptive pill. This is because the hormones in it might affect the cancer.  It is not known for sure whether or not chemotherapy drugs can be passed on through semen or secretions from the vagina. Because of this some doctors advise people to use a barrier method if you have sex during treatment. This applies to vaginal, anal or oral sex. Generally, doctors advise a barrier method only for the time you are actually having the treatment and for about a week after your treatment. Advice like this can be worrying, but this does not mean that you have to avoid being intimate with your partner. You can still have close contact with your partner and continue to enjoy sex.  Animals If you have cats or birds we just ask that you not change the litter or change the cage.  Please have someone else do this for you while you are on chemotherapy.   Food Safety During and After Cancer Treatment Food safety is important for people both during and after cancer treatment. Cancer and cancer treatments, such as chemotherapy, radiation therapy, and stem cell/bone marrow transplantation, often weaken the immune system. This makes it harder for your body to protect itself from foodborne illness, also called food poisoning. Foodborne illness is caused by eating food that contains harmful bacteria, parasites, or viruses.  Foods to avoid Some foods have a higher risk of becoming tainted with bacteria. These include: [name] Unwashed fresh fruit and vegetables, especially leafy vegetables that can hide dirt and other contaminants . Raw  sprouts, such as alfalfa sprouts . Raw or undercooked beef, especially ground beef, or other raw or undercooked meat and poultry . Fatty, fried, or spicy foods immediately before or after treatment.  These can sit heavy on your stomach and make  you feel nauseous. . Raw or undercooked shellfish, such as oysters. . Sushi and sashimi, which often contain raw fish.  . Unpasteurized beverages, such as unpasteurized fruit juices, raw milk, raw yogurt, or cider . Undercooked eggs, such as soft boiled, over easy, and poached; raw, unpasteurized eggs; or foods made with raw egg, such as homemade raw cookie dough and homemade mayonnaise  Simple steps for food safety  Shop smart. . Do not buy food stored or displayed in an unclean area. . Do not buy bruised or damaged fruits or vegetables. . Do not buy cans that have cracks, dents, or bulges. . Pick up foods that can spoil at the end of your shopping trip and store them in a cooler on the way home.  Prepare and clean up foods carefully. . Rinse all fresh fruits and vegetables under running water, and dry them with a clean towel or paper towel. . Clean the top of cans before opening them. . After preparing food, wash your hands for 20 seconds with hot water and soap. Pay special attention to areas between fingers and under nails. . Clean your utensils and dishes with hot water and soap. [name] Disinfect your kitchen and cutting boards using 1 teaspoon of liquid, unscented bleach mixed into 1 quart of water.    Dispose of old food. . Eat canned and packaged food before its expiration date (the "use by" or "best before" date). . Consume refrigerated leftovers within 3 to 4 days. After that time, throw out the food. Even if the food does not smell or look spoiled, it still may be unsafe. Some bacteria, such as Listeria, can grow even on foods stored in the refrigerator if they are kept for too long.  Take precautions when eating out. . At restaurants, avoid  buffets and salad bars where food sits out for a long time and comes in contact with many people. Food can become contaminated when someone with a virus, often a norovirus, or another "bug" handles it. . Put any leftover food in a "to-go" container yourself, rather than having the server do it. And, refrigerate leftovers as soon as you get home. . Choose restaurants that are clean and that are willing to prepare your food as you order it cooked.   AT HOME MEDICATIONS:                                                                                                                                                                Compazine/Prochlorperazine 69m tablet. Take 1 tablet every 6 hours as needed for nausea/vomiting. (This can make you sleepy)   EMLA cream. Apply a quarter size amount to port site 1 hour prior to chemo. Do not rub in. Cover with plastic wrap.  Diarrhea Sheet   If you are having loose stools/diarrhea, please purchase Imodium and begin taking as outlined:  At the first sign of poorly formed or loose stools you should begin taking Imodium (loperamide) 2 mg capsules.  Take two tablets (5m) followed by one tablet (272m every 2 hours - DO NOT EXCEED 8 tablets in 24 hours.  If it is bedtime and you are having loose stools, take 2 tablets at bedtime, then 2 tablets every 4 hours until morning.   Always call the [facility] you are having loose stools/diarrhea that you can't get under control.  Loose stools/diarrhea leads to dehydration (loss of water) in your body.  We have other options of trying to get the loose stools/diarrhea to stop but you must let usKoreanow!   Constipation Sheet  Colace - 100 mg capsules - take 2 capsules daily.  If this doesn't help then you can increase to 2 capsules twice daily.  Please call if the above does not work for you. Do not go more than 2 days without a bowel movement.  It is very important that you do not become constipated.  It will  make you feel sick to your stomach (nausea) and can cause abdominal pain and vomiting.  Nausea Sheet   Compazine/Prochlorperazine 1035mablet. Take 1 tablet every 6 hours as needed for nausea/vomiting (This can make you drowsy).  If you are having persistent nausea (nausea that does not stop) please call the [facility] let us [place] the amount of nausea that you are experiencing.  If you begin to vomit, you need to call the [facility] if it is the weekend and you have vomited more than one time and can't get it to stop-go to the Emergency Room.  Persistent nausea/vomiting can lead to dehydration (loss of fluid in your body) and will make you feel very weak and unwell. Ice chips, sips of clear liquids, foods that are at room temperature, crackers, and toast tend to be better tolerated.    SYMPTOMS TO REPORT AS SOON AS POSSIBLE AFTER TREATMENT:  FEVER GREATER THAN 100.5 F  CHILLS WITH OR WITHOUT FEVER  NAUSEA AND VOMITING THAT IS NOT CONTROLLED WITH YOUR NAUSEA MEDICATION  UNUSUAL SHORTNESS OF BREATH  UNUSUAL BRUISING OR BLEEDING  TENDERNESS IN MOUTH AND THROAT WITH OR WITHOUT   PRESENCE OF ULCERS  URINARY PROBLEMS  BOWEL PROBLEMS  UNUSUAL RASH      Wear comfortable clothing and clothing appropriate for easy access to any Portacath or PICC line. Let us [place] if there is anything that we can do to make your therapy better!    What to do if you need assistance after hours or on the weekends: CALL [phone] on the line, do not hang up.  You will hear multiple messages but at the end you will be connected with a nurse triage line.  They will contact the doctor if necessary.  Most of the time they will be able to assist you.  Do not call the hospital operator.      I have been informed and understand all of the instructions given to me and have received a copy. I have been instructed to call the clinic ([phone] my family physician as soon as possible  for continued medical care, if indicated. I do not have any more questions at this time but understand that I may call the [facility] the Patient Navigator at ([phone]  office hours should I have questions or need assistance in obtaining follow-up care.

## 2018-11-14 MED ORDER — LIDOCAINE-PRILOCAINE 2.5-2.5 % EX CREA: TOPICAL_CREAM | CUTANEOUS | 0 refills | Status: DC

## 2018-11-14 MED ORDER — PROCHLORPERAZINE MALEATE 10 MG PO TABS: 10.0000 mg | ORAL_TABLET | Freq: Four times a day (QID) | ORAL | 1 refills | Status: DC | PRN

## 2018-11-15 ENCOUNTER — Inpatient Hospital Stay (HOSPITAL_COMMUNITY): Payer: Medicare Other | Admitting: General Practice

## 2018-11-15 ENCOUNTER — Encounter (HOSPITAL_COMMUNITY): Payer: Self-pay | Admitting: General Practice

## 2018-11-15 NOTE — Progress Notes (Signed)
[facility] Initial Psychosocial Assessment Clinical Social Work  Clinical Social Work contacted by phone to assess psychosocial, emotional, mental health, and spiritual needs of the patient.   Barriers to care/review of distress screen:  - Transportation:  Do you anticipate any problems getting to appointments?  Do you have someone who can help run errands for you if you need it?  Daugther drives to appointments. No concerns. - Help at home:  What is your living situation (alone, family, other)?  If you are physically unable to care for yourself, who would you call on to help you?  Lives w wife of 68 years. "We do pretty good right now."  Daughter lives nearby and can help if needed.  [place] being able to observe birds and animals. - Support system:  What does your support system look like?  Who would you call on if you needed some kind of practical help?  What if you needed someone to talk to for emotional support?  Wife and daughter are primary support.  "I don't hang out w nobody - we've been quarantined since end of March"  Neighbors mow his grass and help wife w errands/shopping.  Church has offered help but he's reluctant to accept anyone coming into the house.  Church has continued to meet as its a Therapist, nutritional - however, he has not attended due to [place].  Many grandchildren and great grandchildren. - Finances:  Are you concerned about finances   Considering returning to work?  If not, applying for disability?  Retired, on [employer].  "Things are tight."  However they have food and can cover essentials.    What is your understanding of where you are with your cancer? Its cause?  Your treatment plan and what happens next?  Starts chemotherapy next Monday, "Im looking forward to it I guess."  "I was kind of expecting it, in a way.  Donnald Garre been a smoker for many years."    What are your worries for the future as you begin treatment for cancer? "I try not to worry.  Just take it  as it comes."  "The Bible tells you not to worry>'    What are your hopes and priorities during your treatment? What is important to you? What are your goals for your care?  "the doctor said just take it easy, just do what you think you can do."    What are you willing to sacrifice during your treatment?  Feels life will continue as normal unless he needs more help due to increasing fatigue etc.  Has help from daughter.     CSW Summary:  Patient and family psychosocial functioning including strengths, limitations, and coping skills:  Newly diagnosed patient w lung cancer.  Increasing limitations on physical activity (used to mow his own grass and was more active).  Lives w wife who is also elderly.  Support from neighbors, daughter and church family.  Accepts current state of health, aware that treatment may be challenging but intends to "take it as it comes."  Has learned not to worry, relies on his faith resources.  All practical needs met at this time.  Does not have much appetite and has lost weight - will let nurse navigator know so this can be followed closely.    Identifications of barriers to care:  Limited income but no immediate concerns  Availability of community resources:  Stable social support system, no outside resources needed.  Patient encouraged to call as  needed for help or resources.    Clinical Social Worker follow up needed: No.   Edwyna Shell, [place] Social Worker Phone:  [phone] Cell:  [phone]

## 2018-11-16 ENCOUNTER — Emergency Department (HOSPITAL_COMMUNITY): Payer: Medicare Other

## 2018-11-16 ENCOUNTER — Inpatient Hospital Stay (HOSPITAL_COMMUNITY)
Admission: EM | Admit: 2018-11-16 | Discharge: 2018-11-18 | DRG: 982 | Disposition: A | Discharge status: Home / Self Care | Payer: Medicare Other | Attending: Pulmonary Disease | Admitting: Pulmonary Disease

## 2018-11-16 ENCOUNTER — Other Ambulatory Visit (HOSPITAL_COMMUNITY): Payer: Self-pay | Admitting: Hematology

## 2018-11-16 ENCOUNTER — Other Ambulatory Visit: Payer: Self-pay

## 2018-11-16 ENCOUNTER — Encounter (HOSPITAL_COMMUNITY): Payer: Self-pay

## 2018-11-16 DIAGNOSIS — I129 Hypertensive chronic kidney disease with stage 1 through stage 4 chronic kidney disease, or unspecified chronic kidney disease: Secondary | ICD-10-CM | POA: Diagnosis present

## 2018-11-16 DIAGNOSIS — Z888 Allergy status to other drugs, medicaments and biological substances status: Secondary | ICD-10-CM

## 2018-11-16 DIAGNOSIS — I10 Essential (primary) hypertension: Secondary | ICD-10-CM | POA: Diagnosis not present

## 2018-11-16 DIAGNOSIS — C3490 Malignant neoplasm of unspecified part of unspecified bronchus or lung: Secondary | ICD-10-CM | POA: Diagnosis present

## 2018-11-16 DIAGNOSIS — C3432 Malignant neoplasm of lower lobe, left bronchus or lung: Principal | ICD-10-CM | POA: Diagnosis present

## 2018-11-16 DIAGNOSIS — Z7902 Long term (current) use of antithrombotics/antiplatelets: Secondary | ICD-10-CM

## 2018-11-16 DIAGNOSIS — Z7901 Long term (current) use of anticoagulants: Secondary | ICD-10-CM

## 2018-11-16 DIAGNOSIS — Z87891 Personal history of nicotine dependence: Secondary | ICD-10-CM | POA: Diagnosis not present

## 2018-11-16 DIAGNOSIS — Z20828 Contact with and (suspected) exposure to other viral communicable diseases: Secondary | ICD-10-CM | POA: Diagnosis present

## 2018-11-16 DIAGNOSIS — N182 Chronic kidney disease, stage 2 (mild): Secondary | ICD-10-CM | POA: Diagnosis present

## 2018-11-16 DIAGNOSIS — E7800 Pure hypercholesterolemia, unspecified: Secondary | ICD-10-CM | POA: Diagnosis present

## 2018-11-16 DIAGNOSIS — Z8249 Family history of ischemic heart disease and other diseases of the circulatory system: Secondary | ICD-10-CM

## 2018-11-16 DIAGNOSIS — Z950 Presence of cardiac pacemaker: Secondary | ICD-10-CM | POA: Diagnosis not present

## 2018-11-16 DIAGNOSIS — Z955 Presence of coronary angioplasty implant and graft: Secondary | ICD-10-CM

## 2018-11-16 DIAGNOSIS — I495 Sick sinus syndrome: Secondary | ICD-10-CM | POA: Diagnosis not present

## 2018-11-16 DIAGNOSIS — I208 Other forms of angina pectoris: Secondary | ICD-10-CM

## 2018-11-16 DIAGNOSIS — Z7189 Other specified counseling: Secondary | ICD-10-CM | POA: Insufficient documentation

## 2018-11-16 DIAGNOSIS — R042 Hemoptysis: Secondary | ICD-10-CM | POA: Diagnosis not present

## 2018-11-16 DIAGNOSIS — E785 Hyperlipidemia, unspecified: Secondary | ICD-10-CM | POA: Diagnosis present

## 2018-11-16 DIAGNOSIS — I480 Paroxysmal atrial fibrillation: Secondary | ICD-10-CM | POA: Diagnosis present

## 2018-11-16 DIAGNOSIS — Z510 Encounter for antineoplastic radiation therapy: Secondary | ICD-10-CM | POA: Diagnosis not present

## 2018-11-16 DIAGNOSIS — D508 Other iron deficiency anemias: Secondary | ICD-10-CM | POA: Diagnosis not present

## 2018-11-16 DIAGNOSIS — Z825 Family history of asthma and other chronic lower respiratory diseases: Secondary | ICD-10-CM | POA: Diagnosis not present

## 2018-11-16 DIAGNOSIS — I252 Old myocardial infarction: Secondary | ICD-10-CM | POA: Diagnosis not present

## 2018-11-16 DIAGNOSIS — Z7982 Long term (current) use of aspirin: Secondary | ICD-10-CM

## 2018-11-16 DIAGNOSIS — Z86010 Personal history of colonic polyps: Secondary | ICD-10-CM | POA: Diagnosis not present

## 2018-11-16 DIAGNOSIS — Z79899 Other long term (current) drug therapy: Secondary | ICD-10-CM

## 2018-11-16 DIAGNOSIS — Z8349 Family history of other endocrine, nutritional and metabolic diseases: Secondary | ICD-10-CM

## 2018-11-16 DIAGNOSIS — I2510 Atherosclerotic heart disease of native coronary artery without angina pectoris: Secondary | ICD-10-CM | POA: Diagnosis not present

## 2018-11-16 DIAGNOSIS — Z95828 Presence of other vascular implants and grafts: Secondary | ICD-10-CM

## 2018-11-16 DIAGNOSIS — N400 Enlarged prostate without lower urinary tract symptoms: Secondary | ICD-10-CM | POA: Diagnosis present

## 2018-11-16 DIAGNOSIS — I272 Pulmonary hypertension, unspecified: Secondary | ICD-10-CM | POA: Diagnosis present

## 2018-11-16 DIAGNOSIS — E871 Hypo-osmolality and hyponatremia: Secondary | ICD-10-CM

## 2018-11-16 DIAGNOSIS — K219 Gastro-esophageal reflux disease without esophagitis: Secondary | ICD-10-CM | POA: Diagnosis present

## 2018-11-16 DIAGNOSIS — R1310 Dysphagia, unspecified: Secondary | ICD-10-CM

## 2018-11-16 DIAGNOSIS — R0602 Shortness of breath: Secondary | ICD-10-CM | POA: Diagnosis not present

## 2018-11-16 DIAGNOSIS — D509 Iron deficiency anemia, unspecified: Secondary | ICD-10-CM | POA: Diagnosis present

## 2018-11-16 DIAGNOSIS — C3492 Malignant neoplasm of unspecified part of left bronchus or lung: Secondary | ICD-10-CM | POA: Diagnosis not present

## 2018-11-16 DIAGNOSIS — Z91048 Other nonmedicinal substance allergy status: Secondary | ICD-10-CM

## 2018-11-16 DIAGNOSIS — D62 Acute posthemorrhagic anemia: Secondary | ICD-10-CM | POA: Diagnosis present

## 2018-11-16 DIAGNOSIS — Z8042 Family history of malignant neoplasm of prostate: Secondary | ICD-10-CM

## 2018-11-16 HISTORY — DX: Malignant (primary) neoplasm, unspecified: C80.1

## 2018-11-16 LAB — COMPREHENSIVE METABOLIC PANEL
ALT: 14 U/L (ref 0–44)
AST: 19 U/L (ref 15–41)
Albumin: 4.2 g/dL (ref 3.5–5.0)
Alkaline Phosphatase: 61 U/L (ref 38–126)
Anion gap: 9 (ref 5–15)
BUN: 12 mg/dL (ref 8–23)
CO2: 26 mmol/L (ref 22–32)
Calcium: 9.2 mg/dL (ref 8.9–10.3)
Chloride: 90 mmol/L — ABNORMAL LOW (ref 98–111)
Creatinine, Ser: 0.98 mg/dL (ref 0.61–1.24)
GFR calc Af Amer: >60 mL/min (ref >60)
GFR calc non Af Amer: >60 mL/min (ref >60)
Glucose, Bld: 100 mg/dL — ABNORMAL HIGH (ref 70–99)
Potassium: 3.7 mmol/L (ref 3.5–5.1)
Sodium: 125 mmol/L — ABNORMAL LOW (ref 135–145)
Total Bilirubin: 1.8 mg/dL — ABNORMAL HIGH (ref 0.3–1.2)
Total Protein: 8.2 g/dL — ABNORMAL HIGH (ref 6.5–8.1)

## 2018-11-16 LAB — PROTIME-INR
INR: 1.9 — ABNORMAL HIGH (ref 0.8–1.2)
Prothrombin Time: 21.6 seconds — ABNORMAL HIGH (ref 11.4–15.2)

## 2018-11-16 LAB — CBC WITH DIFFERENTIAL/PLATELET
Abs Immature Granulocytes: 0.02 10*3/uL (ref 0.00–0.07)
Basophils Absolute: 0.1 10*3/uL (ref 0.0–0.1)
Basophils Relative: 1 %
Eosinophils Absolute: 0.9 10*3/uL — ABNORMAL HIGH (ref 0.0–0.5)
Eosinophils Relative: 11 %
HCT: 35.5 % — ABNORMAL LOW (ref 39.0–52.0)
Hemoglobin: 11.6 g/dL — ABNORMAL LOW (ref 13.0–17.0)
Immature Granulocytes: 0 %
Lymphocytes Relative: 9 %
Lymphs Abs: 0.7 10*3/uL (ref 0.7–4.0)
MCH: 31.1 pg (ref 26.0–34.0)
MCHC: 32.7 g/dL (ref 30.0–36.0)
MCV: 95.2 fL (ref 80.0–100.0)
Monocytes Absolute: 0.5 10*3/uL (ref 0.1–1.0)
Monocytes Relative: 7 %
Neutro Abs: 5.6 10*3/uL (ref 1.7–7.7)
Neutrophils Relative %: 72 %
Platelets: 238 10*3/uL (ref 150–400)
RBC: 3.73 MIL/uL — ABNORMAL LOW (ref 4.22–5.81)
RDW: 13.9 % (ref 11.5–15.5)
WBC: 7.8 10*3/uL (ref 4.0–10.5)
nRBC: 0 % (ref 0.0–0.2)

## 2018-11-16 LAB — TYPE AND SCREEN
ABO/RH(D): A POS
Antibody Screen: POSITIVE
DAT, IgG: POSITIVE

## 2018-11-16 LAB — SARS CORONAVIRUS 2 BY RT PCR (HOSPITAL ORDER, PERFORMED IN ~~LOC~~ HOSPITAL LAB): SARS Coronavirus 2: NEGATIVE

## 2018-11-16 LAB — TROPONIN I (HIGH SENSITIVITY)
Troponin I (High Sensitivity): 35 ng/L — ABNORMAL HIGH (ref <18)
Troponin I (High Sensitivity): 30 ng/L — ABNORMAL HIGH (ref <18)

## 2018-11-16 MED ORDER — SENNOSIDES-DOCUSATE SODIUM 8.6-50 MG PO TABS: 1.0000 | ORAL_TABLET | Freq: Every evening | ORAL | Status: DC | PRN

## 2018-11-16 MED ORDER — OXYCODONE HCL 5 MG PO TABS: 2.5000 mg | ORAL_TABLET | ORAL | Status: DC | PRN

## 2018-11-16 MED ORDER — FERROUS SULFATE 325 (65 FE) MG PO TABS
325.0000 mg | ORAL_TABLET | Freq: Every day | ORAL | Status: DC
  Administered 2018-11-17 – 2018-11-18 (×2): 325 mg via ORAL
  Filled 2018-11-16 (×2): qty 1

## 2018-11-16 MED ORDER — POLYVINYL ALCOHOL 1.4 % OP SOLN: 1.0000 [drp] | OPHTHALMIC | Status: DC | PRN

## 2018-11-16 MED ORDER — SODIUM CHLORIDE 0.9 % IV SOLN
INTRAVENOUS | Status: DC
  Administered 2018-11-16: 15:00:00 via INTRAVENOUS

## 2018-11-16 MED ORDER — ACETAMINOPHEN 325 MG PO TABS
650.0000 mg | ORAL_TABLET | Freq: Four times a day (QID) | ORAL | Status: DC | PRN
  Administered 2018-11-18: 650 mg via ORAL
  Filled 2018-11-16: qty 2

## 2018-11-16 MED ORDER — PROPYLENE GLYCOL 0.6 % OP SOLN: 1.0000 [drp] | Freq: Two times a day (BID) | OPHTHALMIC | Status: DC | PRN

## 2018-11-16 MED ORDER — DORZOLAMIDE HCL-TIMOLOL MAL 2-0.5 % OP SOLN
1.0000 [drp] | Freq: Two times a day (BID) | OPHTHALMIC | Status: DC
  Administered 2018-11-16 – 2018-11-18 (×4): 1 [drp] via OPHTHALMIC
  Filled 2018-11-16: qty 10

## 2018-11-16 MED ORDER — LOSARTAN POTASSIUM 50 MG PO TABS
25.0000 mg | ORAL_TABLET | Freq: Every day | ORAL | Status: DC
  Administered 2018-11-16 – 2018-11-17 (×2): 25 mg via ORAL
  Administered 2018-11-18: 13:00:00 via ORAL
  Filled 2018-11-16 (×3): qty 1

## 2018-11-16 MED ORDER — TRAZODONE HCL 50 MG PO TABS: 50.0000 mg | ORAL_TABLET | Freq: Every evening | ORAL | Status: DC | PRN

## 2018-11-16 MED ORDER — IOHEXOL 350 MG/ML SOLN
100.0000 mL | Freq: Once | INTRAVENOUS | Status: AC | PRN
  Administered 2018-11-16: 12:00:00 100 mL via INTRAVENOUS

## 2018-11-16 MED ORDER — PRAVASTATIN SODIUM 10 MG PO TABS
10.0000 mg | ORAL_TABLET | Freq: Every day | ORAL | Status: DC
  Administered 2018-11-16 – 2018-11-17 (×2): 10 mg via ORAL
  Filled 2018-11-16 (×2): qty 1

## 2018-11-16 MED ORDER — ACETAMINOPHEN 650 MG RE SUPP: 650.0000 mg | Freq: Four times a day (QID) | RECTAL | Status: DC | PRN

## 2018-11-16 MED ORDER — PANTOPRAZOLE SODIUM 40 MG PO TBEC
40.0000 mg | DELAYED_RELEASE_TABLET | Freq: Every day | ORAL | Status: DC
  Administered 2018-11-16 – 2018-11-18 (×3): 40 mg via ORAL
  Filled 2018-11-16 (×3): qty 1

## 2018-11-16 MED ORDER — METOPROLOL SUCCINATE ER 25 MG PO TB24
25.0000 mg | ORAL_TABLET | Freq: Every day | ORAL | Status: DC
  Administered 2018-11-17 – 2018-11-18 (×2): 25 mg via ORAL
  Filled 2018-11-16 (×2): qty 1

## 2018-11-16 MED ORDER — TAMSULOSIN HCL 0.4 MG PO CAPS
0.4000 mg | ORAL_CAPSULE | Freq: Every day | ORAL | Status: DC
  Administered 2018-11-16 – 2018-11-17 (×2): 0.4 mg via ORAL
  Filled 2018-11-16 (×2): qty 1

## 2018-11-16 MED ORDER — VITAMIN D 25 MCG (1000 UNIT) PO TABS
1000.0000 [IU] | ORAL_TABLET | Freq: Two times a day (BID) | ORAL | Status: DC
  Administered 2018-11-16 – 2018-11-18 (×5): 1000 [IU] via ORAL
  Filled 2018-11-16 (×4): qty 1

## 2018-11-16 MED ORDER — ONDANSETRON HCL 4 MG PO TABS: 4.0000 mg | ORAL_TABLET | Freq: Four times a day (QID) | ORAL | Status: DC | PRN

## 2018-11-16 MED ORDER — ONDANSETRON HCL 4 MG/2ML IJ SOLN: 4.0000 mg | Freq: Four times a day (QID) | INTRAMUSCULAR | Status: DC | PRN

## 2018-11-16 NOTE — H&P (Signed)
History and Physical  Nicholas Sanchez QVZ:[id number] DOB: 06/23/1933 DOA: 11/16/2018  Referring physician: Laverta Baltimore, MD   PCP: Sinda Du, MD   Chief Complaint: coughing up blood    HPI: Nicholas Sanchez is a 85 y.o. 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 Delton Coombes and planning to begin chemotherapy soon after port a cath placement.  He presented to ED with symptoms of coughing up blood more than usual starting earlier this morning.  He says that he has had multiple episodes of coughing up teaspoon amounts of blood which made him more concerned because the frequency has increased.  He has no chest pain and no SOB.  He denies fever and chills.  He tolerated the lung biopsy well and he is agreeable to full treatment of lung cancer.  He is supposed to start chemotherapy on Monday 9/21.   He reports SOB with ambulation.  He is on Xarelto, aspirin and plavix for his atrial fibrillation and CAD.    ED course: Hg 11.1, INR 1.9, Na 125, Cr 0.98, WBC 7.8, platelet 238, hstrop 35, 30. Pt was noted to have BP 146/67, RR 24, Temp 98.2, pulse ox 99% on RA.  ED provider discussed with Dr. Delton Coombes who recommended observation admission.   Review of Systems: All systems reviewed and apart from history of presenting illness, are negative.  Past Medical History:  Diagnosis Date   CAD (coronary artery disease)    a. STEMI 04/2008 s/p BMS to prox & distal RCA, staged DES to LAD same admission. b. Nuc 03/2017: scar but no ischemia, EF 45-54%. c. 09/2017: NSTEMI - DES x 2 to proximal and mid-RCA   Cancer (HCC)    CKD (chronic kidney disease), stage II    Cough, persistent    GERD (gastroesophageal reflux disease)    History of BPH    HTN (hypertension)    Hypercholesteremia    LV dysfunction    a. EF 45-50% in 01/2016.   Mild pulmonary hypertension (HCC)    PAF (paroxysmal atrial fibrillation) (HCC)    Presence of permanent cardiac  pacemaker    Sinus node dysfunction ([place])    a. s/p StJude CRT-pacemaker 01/2016.   Past Surgical History:  Procedure Laterality Date   CARDIAC CATHETERIZATION     COLONOSCOPY     COLONOSCOPY N/A 08/27/2017   Dr. [name]: 25 5-25 mm polyps in descending colon, ascending, cecum. 2X3 cm carpet polyp in base of cecum lifted away s/p piecemeal polypectomy. Tubulovillous adenoma and tubular adenomas   CORONARY STENT INTERVENTION N/A 10/22/2017   Procedure: CORONARY STENT INTERVENTION;  Surgeon: [name], MD;  Location: [facility];  Service: Cardiovascular;  Laterality: N/A;   CORONARY STENT PLACEMENT  04/2008   RCA and LAD   EP IMPLANTABLE DEVICE N/A 01/09/2016   Procedure: BiV Pacemaker Insertion CRT-P;  Surgeon: [name], MD;  Location: [place] [facility];  Service: Cardiovascular;  Laterality: N/A;   ESOPHAGOGASTRODUODENOSCOPY N/A 08/27/2017   normal esophagus s/p dilation, gastric petechia, small hiatal hernia, normal duodenum   INSERT / REPLACE / REMOVE PACEMAKER     MALONEY DILATION N/A 08/27/2017   Procedure: MALONEY DILATION;  Surgeon: [name], MD;  [facility];  Service: Endoscopy;  Laterality: N/A;   RIGHT/LEFT HEART CATH AND CORONARY ANGIOGRAPHY N/A 10/21/2017   Procedure: RIGHT/LEFT HEART CATH AND CORONARY ANGIOGRAPHY;  Surgeon: [name], MD;  Location: [place] [facility];  Service: Cardiovascular;  Laterality: N/A;   Social History:  reports that he quit smoking about 45 years ago. His smoking use included cigarettes. He has a 62.50 pack-year smoking history. He quit smokeless tobacco use about 45 years ago.  His smokeless tobacco use included chew. He reports that he does not drink alcohol or use drugs.  Allergies  Allergen Reactions   Ace Inhibitors Nausea Only   Tape Rash    And Bandaids, too    Family History  Problem Relation Age of Onset   Emphysema Mother        mother died with emphysema and cancer   Cancer  Mother    Prostate cancer Brother    Heart disease Brother    Hypothyroidism Daughter    Hypothyroidism Daughter    Colon cancer Neg Hx    Colon polyps Neg Hx     Prior to Admission medications   Medication Sig Start Date End Date Taking? Authorizing Provider  acetaminophen (TYLENOL) 500 MG tablet Take 500 mg by mouth every 6 (six) hours as needed for pain.   Yes [provider]  aspirin EC 81 MG tablet Take 81 mg by mouth daily.   Yes [provider]  cholecalciferol (VITAMIN D) 1000 units tablet Take 1,000 Units by mouth 2 (two) times daily.   Yes [provider]  clopidogrel (PLAVIX) 75 MG tablet TAKE ONE (1) TABLET BY MOUTH EVERY DAY 10/26/18  Yes Herminio Commons, MD  dorzolamide-timolol (COSOPT) 22.3-6.8 MG/ML ophthalmic solution Place 1 drop into the left eye 2 (two) times daily.   Yes [provider]  ferrous sulfate 325 (65 FE) MG tablet Take 325 mg by mouth 3 (three) times daily.   Yes [provider]  hydrochlorothiazide (HYDRODIURIL) 25 MG tablet TAKE ONE (1) TABLET BY MOUTH EVERY DAY Patient taking differently: Take 25 mg by mouth daily.  03/10/18  Yes Herminio Commons, MD  losartan (COZAAR) 25 MG tablet TAKE ONE TABLET BY [place] Patient taking differently: Take 25 mg by mouth daily.  05/26/18  Yes Herminio Commons, MD  lovastatin (MEVACOR) 10 MG tablet TAKE ONE (1) TABLET BY MOUTH EVERY DAY 06/14/18  Yes Herminio Commons, MD  metoprolol succinate (TOPROL XL) 25 MG 24 hr tablet Take 1 tablet (25 mg total) by mouth daily. 08/08/18  Yes Herminio Commons, MD  pantoprazole (PROTONIX) 40 MG tablet Take 40 mg by mouth daily.   Yes [provider]  Propylene Glycol (SYSTANE BALANCE OP) Place 1 drop into both eyes 2 (two) times daily as needed (dry eyes).    Yes [provider]  rivaroxaban (XARELTO) 20 MG TABS tablet Take 1 tablet (20 mg total) by mouth daily with supper. Dx: paroxysmal atrial  fibrillation 07/30/16  Yes [name], MD  tamsulosin (FLOMAX) 0.4 MG CAPS capsule Take 0.4 mg by mouth daily.   Yes [provider]  triamcinolone cream (KENALOG) 0.1 % Apply 1 application topically daily as needed (itching).    Yes [provider]  vitamin B-12 (CYANOCOBALAMIN) 1000 MCG tablet Take 1,000 mcg by mouth daily.   Yes [provider]  CARBOPLATIN IV Inject into the vein once a week. 11/21/18   [provider]  lidocaine-prilocaine (EMLA) cream Apply a dime-sized amount to port a cath site and cover with plastic wrap one hour prior to chemotherapy appointments 11/14/18   Derek Jack, MD  PACLITAXEL IV Inject into the vein once a week. 11/21/18  [provider]  prochlorperazine (COMPAZINE) 10 MG tablet Take 1 tablet (10 mg total) by mouth every 6 (six) hours as needed for nausea or vomiting. 11/14/18   Derek Jack, MD   Physical Exam: Vitals:   11/16/18 1200 11/16/18 1230 11/16/18 1300 11/16/18 1332  BP: (!) 165/84 (!) 160/83 (!) 145/76 (!) 146/67  Pulse: 70 70 70 70  Resp:  (!) 23 (!) 31 (!) 24  Temp:    98.2 F (36.8 C)  TempSrc:    Oral  SpO2: 100% 100% 98% 99%  Weight:      Height:         General exam: Elderly male, Moderately built and nourished patient, lying comfortably supine on the gurney in no obvious distress.  Head, eyes and ENT: Nontraumatic and normocephalic. Pupils equally reacting to light and accommodation. Oral mucosa dry and pale.  Neck: Supple. No JVD, carotid bruit or thyromegaly.  Lymphatics: No lymphadenopathy.  Respiratory system: Clear to auscultation. No increased work of breathing.  Cardiovascular system: normal S1 and S2 heard, paced rhythm sounds. No JVD, murmurs, gallops, clicks or pedal edema.  Gastrointestinal system: Abdomen is nondistended, soft and nontender. Normal bowel sounds heard. No organomegaly or masses appreciated.  Central nervous system: Alert and oriented.  No focal neurological deficits.  Extremities: Symmetric 5 x 5 power. Peripheral pulses symmetrically felt.   Skin: No rashes or acute findings.  Musculoskeletal system: Negative exam.  Psychiatry: Pleasant and cooperative.  Labs on Admission:  Basic Metabolic Panel: Recent Labs  Lab 11/16/18 0939  NA 125*  K 3.7  CL 90*  CO2 26  GLUCOSE 100*  BUN 12  CREATININE 0.98  CALCIUM 9.2   Liver Function Tests: Recent Labs  Lab 11/16/18 0939  AST 19  ALT 14  ALKPHOS 61  BILITOT 1.8*  PROT 8.2*  ALBUMIN 4.2   No results for input(s): LIPASE, AMYLASE in the last 168 hours. No results for input(s): AMMONIA in the last 168 hours. CBC: Recent Labs  Lab 11/16/18 0939  WBC 7.8  NEUTROABS 5.6  HGB 11.6*  HCT 35.5*  MCV 95.2  PLT 238   Cardiac Enzymes: No results for input(s): CKTOTAL, CKMB, CKMBINDEX, TROPONINI in the last 168 hours.  BNP (last 3 results) No results for input(s): PROBNP in the last 8760 hours. CBG: No results for input(s): GLUCAP in the last 168 hours.  Radiological Exams on Admission: Dg Chest 2 View  Result Date: 11/16/2018 CLINICAL DATA:  85 year old male with hemoptysis. EXAM: CHEST - 2 VIEW COMPARISON:  Chest radiograph dated 10/31/2018 FINDINGS: There is diffuse chronic interstitial coarsening and mild bronchitic changes. Bibasilar interstitial coarsening/scarring or atelectasis. An area of increased density over the right minor fissure has improved since the prior radiograph. No new consolidation. No large pleural effusion or pneumothorax. Stable cardiomegaly. Atherosclerotic calcification of the aorta. Left pectoral pacemaker device. No acute osseous pathology. IMPRESSION: Chronic changes.  No new consolidation. Electronically Signed   By: Anner Crete M.D.   On: 11/16/2018 10:23   Ct Angio Chest Pe W And/or Wo Contrast  Result Date: 11/16/2018 CLINICAL DATA:  PE suspected high pretest probability. Status post lung biopsy. Hemoptysis. EXAM:  CT ANGIOGRAPHY CHEST WITH CONTRAST TECHNIQUE: Multidetector CT imaging of the chest was performed using the standard protocol during bolus administration of intravenous contrast. Multiplanar CT image reconstructions and MIPs were obtained to evaluate the vascular anatomy. CONTRAST:  124mL OMNIPAQUE IOHEXOL 350 MG/ML SOLN COMPARISON:  CT dated September 27, 2018. FINDINGS: Cardiovascular:  Evaluation is limited by motion artifact.There is no pulmonary embolus. The main pulmonary artery is dilated measuring approximately 3.6 cm in diameter the heart size is enlarged. Aortic calcifications and coronary artery calcifications are noted. There may be coronary artery stents in place. There is a left-sided pacemaker in place. Mediastinum/Nodes: --pathologically enlarged mediastinal and hilar lymph nodes are again noted. --No axillary lymphadenopathy. --No supraclavicular lymphadenopathy. --Normal thyroid gland. --The esophagus is unremarkable Lungs/Pleura: Again noted are emphysematous changes bilaterally with chronic areas of scarring and atelectasis with potential for underlying pulmonary fibrosis. There is a small left-sided pleural effusion which is new from prior study. Again noted is a left lower lobe lung mass measuring approximately 2.8 by 3 cm. There is no pneumothorax. There is some debris within the dependent portion of the trachea. Upper Abdomen: No acute abnormality. The spleen is likely borderline enlarged but is only partially visualized on this exam. Musculoskeletal: No chest wall abnormality. No acute or significant osseous findings. Review of the MIP images confirms the above findings. IMPRESSION: 1. Study is limited by motion artifact. 2. No evidence for pulmonary embolus. 3. New small left-sided pleural effusion.  No pneumothorax. 4. Again noted is a left lower lobe 3 cm lung mass with evidence for nodal metastatic disease to the mediastinum. Aortic Atherosclerosis (ICD10-I70.0) and Emphysema (ICD10-J43.9).  Electronically Signed   By: Constance Holster M.D.   On: 11/16/2018 12:28    EKG: Personally reviewed. Ventricular paced rhythm.   Assessment/Plan Principal Problem:   Hemoptysis Active Problems:   HTN (hypertension)   Hypercholesteremia   GERD (gastroesophageal reflux disease)   CAD (coronary artery disease)   Paroxysmal atrial fibrillation (HCC)   IDA (iron deficiency anemia)   Dysphagia   Angina at rest [facility])   Squamous cell carcinoma of lung, left (HCC)   Anticoagulant long-term use   Hyponatremia  1. Hemoptysis - Pt has had multiple episodes of teaspoon sized hemoptysis.  His Hg is fairly stable at 11.  Place in hospital for observation.  Holding all anticoagulation and blood thinners for now.  He is remains hemodynamically stable at this time.   2. Essential hypertension - Hold HCTZ due to hyponatremia.  Resume home losartan and metoprolol XL.  3. CAD - stable, resumed pravastatin, metoprolol, holding aspirin, plavix.  4. Hyperlipidemia - continue pravastatin.  5. Squamous cell carcinoma of left lung - He is working with Dr. Delton Coombes to begin chemotherapy treatments soon.  Arrangements are being made for port placement.  6. IDA - continue iron supplementation.  7. GERD - protonix ordered for GI protection.  8. Paroxysmal Atrial fibrillation - HOLD Xarelto due to active bleed.  Continue home metoprolol XL.  9. Dysphagia - Dys 3 diet and NTL ordered.  10. Hyponatremia - likely secondary to HCTZ 25 mg daily which is being held.  Gently hydrate with normal saline.  Recheck BMP in AM.    DVT Prophylaxis: SCDs Code Status:  Full   Family Communication: patient   Disposition Plan: observation  Severity of Illness: The appropriate patient status for this patient is OBSERVATION. Observation status is judged to be reasonable and necessary in order to provide the required intensity of service to ensure the patient's safety. The patient's presenting symptoms, physical exam findings,  and initial radiographic and laboratory data in the context of their medical condition is felt to place them at decreased risk for further clinical deterioration. Furthermore, it is anticipated that the patient will be medically stable for discharge from the hospital within  2 midnights of admission. The following factors support the patient status of observation.   " The patient's presenting symptoms include hemoptysis. " The physical exam findings include paced heart rhythm. " The initial radiographic and laboratory data are Hg 11.1, INR 1.9, sodium 125, vitals stable.  Time spent: 57 minutes   Irwin Brakeman, MD [facility] How to contact the [facility] Attending or Consulting provider [place] or covering provider during after hours [place], for this patient?  1. Check the care team in [facility] and look for a) attending/consulting [facility] provider listed and b) the [facility] team listed 2. Log into [email or web address] and use [place] universal password to access. If you do not have the password, please contact the hospital operator. 3. Locate the [facility] provider you are looking for under [facility] and page to a number that you can be directly reached. 4. If you still have difficulty reaching the provider, please page the [facility] (Director on Call) for the Hospitalists listed on amion for assistance.

## 2018-11-16 NOTE — Progress Notes (Signed)
Non-Small Cell Lung - No Medical Intervention - Off Treatment.  Patient Characteristics: Stage III - Unresectable, PS ? 2 AJCC T Category: T1c Current Disease Status: No Distant Mets or Local Recurrence AJCC N Category: N3 AJCC M Category: M0 AJCC 8 Stage Grouping: IIIB ECOG Performance Status: 2

## 2018-11-16 NOTE — Progress Notes (Signed)
DISCONTINUE ON PATHWAY REGIMEN - Non-Small Cell Lung  No Medical Intervention - Off Treatment.  REASON: Other Reason PRIOR TREATMENT: Lung285: Referral to Radiation Oncology  START ON PATHWAY REGIMEN - Non-Small Cell Lung     Administer weekly:     Paclitaxel      Carboplatin   **Always confirm dose/schedule in your pharmacy ordering system**  Patient Characteristics: Stage III - Unresectable, PS = 0, 1 AJCC T Category: T1c Current Disease Status: No Distant Mets or Local Recurrence AJCC N Category: N3 AJCC M Category: M0 AJCC 8 Stage Grouping: IIIB ECOG Performance Status: 1 Intent of Therapy: Non-Curative / Palliative Intent, Discussed with Patient

## 2018-11-16 NOTE — ED Provider Notes (Signed)
Emergency Department Provider Note   I have reviewed the triage vital signs and the nursing notes.   HISTORY  Chief Complaint coughing up blood   HPI Nicholas Sanchez is a 85 y.o. male with past medical history listed below including squamous cell carcinoma of the left lung status post CT biopsy on 8/31 presents to the emergency department with hemoptysis.  He has had more mild episodes of hemoptysis in the past and frequently has these in the morning.  He states that today he has had several episodes of large-volume bright red blood with coughing.  He estimates approximately 1 teaspoon per episode.  Denies significant chest pain or shortness of breath.  He does become dyspneic with ambulation.  Denies fevers.  He is due to start chemotherapy on Monday and is followed by Dr. Delton Coombes here at [facility].  The patient is on Xarelto and has been compliant with this.  No radiation of symptoms or other modifying factors.    Past Medical History:  Diagnosis Date   CAD (coronary artery disease)    a. STEMI 04/2008 s/p BMS to prox & distal RCA, staged DES to LAD same admission. b. Nuc 03/2017: scar but no ischemia, EF 45-54%. c. 09/2017: NSTEMI - DES x 2 to proximal and mid-RCA   Cancer (HCC)    CKD (chronic kidney disease), stage II    Cough, persistent    GERD (gastroesophageal reflux disease)    History of BPH    HTN (hypertension)    Hypercholesteremia    LV dysfunction    a. EF 45-50% in 01/2016.   Mild pulmonary hypertension (HCC)    PAF (paroxysmal atrial fibrillation) (HCC)    Presence of permanent cardiac pacemaker    Sinus node dysfunction ([place])    a. s/p StJude CRT-pacemaker 01/2016.    Patient Active Problem List   Diagnosis Date Noted   Hemoptysis 11/16/2018   Anticoagulant Juno Alers-term use 11/16/2018   Hyponatremia 11/16/2018   Squamous cell carcinoma of lung, left ([place]) 11/09/2018   Mass of lower lobe of left lung 10/26/2018   Hx of adenomatous  colonic polyps 11/09/2017   Angina at rest [facility]) 10/20/2017   NSTEMI (non-ST elevated myocardial infarction) ([place]) 10/20/2017   IDA (iron deficiency anemia) 08/04/2017   Dysphagia 08/04/2017   Paroxysmal atrial fibrillation ([place]) 01/21/2016   Heart block 01/08/2016   CAD (coronary artery disease)    HTN (hypertension)    Hypercholesteremia    GERD (gastroesophageal reflux disease)     Past Surgical History:  Procedure Laterality Date   CARDIAC CATHETERIZATION     COLONOSCOPY     COLONOSCOPY N/A 08/27/2017   Dr. [name]: 25 5-25 mm polyps in descending colon, ascending, cecum. 2X3 cm carpet polyp in base of cecum lifted away s/p piecemeal polypectomy. Tubulovillous adenoma and tubular adenomas   CORONARY STENT INTERVENTION N/A 10/22/2017   Procedure: CORONARY STENT INTERVENTION;  Surgeon: [name], MD;  Location: [place] [facility];  Service: Cardiovascular;  Laterality: N/A;   CORONARY STENT PLACEMENT  04/2008   RCA and LAD   EP IMPLANTABLE DEVICE N/A 01/09/2016   Procedure: BiV Pacemaker Insertion CRT-P;  Surgeon: [name], MD;  Location: [place] [facility];  Service: Cardiovascular;  Laterality: N/A;   ESOPHAGOGASTRODUODENOSCOPY N/A 08/27/2017   normal esophagus s/p dilation, gastric petechia, small hiatal hernia, normal duodenum   INSERT / REPLACE / REMOVE PACEMAKER     MALONEY DILATION N/A 08/27/2017   Procedure: MALONEY DILATION;  Surgeon: [name], MD;  [facility];  Service: Endoscopy;  Laterality: N/A;   RIGHT/LEFT HEART CATH AND CORONARY ANGIOGRAPHY N/A 10/21/2017   Procedure: RIGHT/LEFT HEART CATH AND CORONARY ANGIOGRAPHY;  Surgeon: [name], MD;  Location: [place] [facility];  Service: Cardiovascular;  Laterality: N/A;    Allergies Ace inhibitors and Tape  Family History  Problem Relation Age of Onset   Emphysema Mother        mother died with emphysema and cancer   Cancer Mother    Prostate cancer Brother     Heart disease Brother    Hypothyroidism Daughter    Hypothyroidism Daughter    Colon cancer Neg Hx    Colon polyps Neg Hx     Social History Social History   Tobacco Use   Smoking status: Former Smoker    Packs/day: 2.50    Years: 25.00    Pack years: 62.50    Types: Cigarettes    Quit date: 06/14/1973    Years since quitting: 45.4   Smokeless tobacco: Former Systems developer    Types: Chew    Quit date: 06/14/1973   Tobacco comment: some/chewed very little for about 2 years  Substance Use Topics   Alcohol use: No   Drug use: No    Review of Systems  Constitutional: No fever/chills Eyes: No visual changes. ENT: No sore throat. Cardiovascular: Denies chest pain. Respiratory: Positive shortness of breath with ambulation. Positive hemoptysis.  Gastrointestinal: No abdominal pain.  No nausea, no vomiting.  No diarrhea.  No constipation. Genitourinary: Negative for dysuria. Musculoskeletal: Negative for back pain. Skin: Negative for rash. Neurological: Negative for headaches, focal weakness or numbness.  10-point ROS otherwise negative.  ____________________________________________   PHYSICAL EXAM:  VITAL SIGNS: Vitals:   11/16/18 1300 11/16/18 1332  BP: (!) 145/76 (!) 146/67  Pulse: 70 70  Resp: (!) 31 (!) 24  Temp:  98.2 F (36.8 C)  SpO2: 98% 99%    Constitutional: Alert and oriented. Well appearing and in no acute distress. Eyes: Conjunctivae are normal. Head: Atraumatic. Nose: No congestion/rhinnorhea. Mouth/Throat: Mucous membranes are moist.  Neck: No stridor.  Cardiovascular: Normal rate, regular rhythm. Good peripheral circulation. Grossly normal heart sounds.   Respiratory: Normal respiratory effort.  No retractions. Lungs CTAB. Gastrointestinal: Soft and nontender. No distention.  Musculoskeletal: No lower extremity tenderness nor edema. No gross deformities of extremities. Neurologic:  Normal speech and language.  Skin:  Skin is warm, dry and  intact. No rash noted.  ____________________________________________   LABS (all labs ordered are listed, but only abnormal results are displayed)  Labs Reviewed  COMPREHENSIVE METABOLIC PANEL - Abnormal; Notable for the following components:      Result Value   Sodium 125 (*)    Chloride 90 (*)    Glucose, Bld 100 (*)    Total Protein 8.2 (*)    Total Bilirubin 1.8 (*)    All other components within normal limits  CBC WITH DIFFERENTIAL/PLATELET - Abnormal; Notable for the following components:   RBC 3.73 (*)    Hemoglobin 11.6 (*)    HCT 35.5 (*)    Eosinophils Absolute 0.9 (*)    All other components within normal limits  PROTIME-INR - Abnormal; Notable for the following components:   Prothrombin Time 21.6 (*)    INR 1.9 (*)    All other components within normal limits  TROPONIN I (HIGH SENSITIVITY) - Abnormal; Notable for the following components:   Troponin I (  High Sensitivity) 35 (*)    All other components within normal limits  TROPONIN I (HIGH SENSITIVITY) - Abnormal; Notable for the following components:   Troponin I (High Sensitivity) 30 (*)    All other components within normal limits  SARS CORONAVIRUS 2 (HOSPITAL ORDER, [place] [facility])  TYPE AND SCREEN   ____________________________________________  EKG   EKG Interpretation  Date/Time:  Wednesday November 16 2018 09:29:20 EDT Ventricular Rate:  70 PR Interval:    QRS Duration: 142 QT Interval:  436 QTC Calculation: 471 R Axis:   -84 Text Interpretation:  V paced rhythm.  Confirmed by Nanda Quinton [phone]) on 11/16/2018 9:34:15 AM       ____________________________________________  RADIOLOGY  Dg Chest 2 View  Result Date: 11/16/2018 CLINICAL DATA:  85 year old male with hemoptysis. EXAM: CHEST - 2 VIEW COMPARISON:  Chest radiograph dated 10/31/2018 FINDINGS: There is diffuse chronic interstitial coarsening and mild bronchitic changes. Bibasilar interstitial coarsening/scarring  or atelectasis. An area of increased density over the right minor fissure has improved since the prior radiograph. No new consolidation. No large pleural effusion or pneumothorax. Stable cardiomegaly. Atherosclerotic calcification of the aorta. Left pectoral pacemaker device. No acute osseous pathology. IMPRESSION: Chronic changes.  No new consolidation. Electronically Signed   By: Anner Crete M.D.   On: 11/16/2018 10:23   Ct Angio Chest Pe W And/or Wo Contrast  Result Date: 11/16/2018 CLINICAL DATA:  PE suspected high pretest probability. Status post lung biopsy. Hemoptysis. EXAM: CT ANGIOGRAPHY CHEST WITH CONTRAST TECHNIQUE: Multidetector CT imaging of the chest was performed using the standard protocol during bolus administration of intravenous contrast. Multiplanar CT image reconstructions and MIPs were obtained to evaluate the vascular anatomy. CONTRAST:  161mL OMNIPAQUE IOHEXOL 350 MG/ML SOLN COMPARISON:  CT dated September 27, 2018. FINDINGS: Cardiovascular: Evaluation is limited by motion artifact.There is no pulmonary embolus. The main pulmonary artery is dilated measuring approximately 3.6 cm in diameter the heart size is enlarged. Aortic calcifications and coronary artery calcifications are noted. There may be coronary artery stents in place. There is a left-sided pacemaker in place. Mediastinum/Nodes: --pathologically enlarged mediastinal and hilar lymph nodes are again noted. --No axillary lymphadenopathy. --No supraclavicular lymphadenopathy. --Normal thyroid gland. --The esophagus is unremarkable Lungs/Pleura: Again noted are emphysematous changes bilaterally with chronic areas of scarring and atelectasis with potential for underlying pulmonary fibrosis. There is a small left-sided pleural effusion which is new from prior study. Again noted is a left lower lobe lung mass measuring approximately 2.8 by 3 cm. There is no pneumothorax. There is some debris within the dependent portion of the trachea.  Upper Abdomen: No acute abnormality. The spleen is likely borderline enlarged but is only partially visualized on this exam. Musculoskeletal: No chest wall abnormality. No acute or significant osseous findings. Review of the MIP images confirms the above findings. IMPRESSION: 1. Study is limited by motion artifact. 2. No evidence for pulmonary embolus. 3. New small left-sided pleural effusion.  No pneumothorax. 4. Again noted is a left lower lobe 3 cm lung mass with evidence for nodal metastatic disease to the mediastinum. Aortic Atherosclerosis (ICD10-I70.0) and Emphysema (ICD10-J43.9). Electronically Signed   By: Constance Holster M.D.   On: 11/16/2018 12:28    ____________________________________________   PROCEDURES  Procedure(s) performed:   Procedures  None ____________________________________________   INITIAL IMPRESSION / ASSESSMENT AND PLAN / ED COURSE  Pertinent labs & imaging results that were available during my care of the patient were reviewed by me and  considered in my medical decision making (see chart for details).   Patient presents to the emergency department for evaluation of hemoptysis which is acutely worsening this morning.  My evaluation he is in no respiratory distress.  Saturations are normal.  Heart rate normal.  No hypertension.  Suspect that bleeding is related to his left lung cancer.  Lower suspicion for PE.  Patient had CT biopsy that this is been over 2 weeks prior so doubt complication at this point.   01:10 PM  CT imaging reviewed.  No PE.CT reviewed. No PE. Debris in the dependent portion of the trachea. No hypoxemia. Labs unremarkable. Patient continues to have mild hemoptysis here in the ED without distress. Spoke with Dr. Delton Coombes regarding the case. Advises holding Xarelto which patient is taking for PAF. Will observe overnight.   Discussed patient's case with [facility], Dr. Wynetta Emery to request admission. Patient and family (if present) updated with plan.  [facility]  I reviewed all nursing notes, vitals, pertinent old records, EKGs, labs, imaging (as available).  ____________________________________________  FINAL CLINICAL IMPRESSION(S) / ED DIAGNOSES  Final diagnoses:  Hemoptysis    MEDICATIONS GIVEN DURING THIS VISIT:  Medications  iohexol (OMNIPAQUE) 350 MG/ML injection 100 mL (100 mLs Intravenous Contrast Given 11/16/18 1147)    Note:  This document was prepared using Dragon voice recognition software and may include unintentional dictation errors.  Nanda Quinton, MD Emergency Medicine    Taquisha Phung, [facility], MD 11/16/18 1345

## 2018-11-16 NOTE — ED Triage Notes (Signed)
Pt reports had lung biopsy last week and is supposed to start cancer treatments next Monday.  Reports coughing up bright red blood this morning.  Reports some sob, denies pain or fever.

## 2018-11-16 NOTE — ED Notes (Addendum)
Updated family member, Ms. Willard via phone. Left number to call if needed and for update, [phone].

## 2018-11-17 ENCOUNTER — Other Ambulatory Visit (HOSPITAL_COMMUNITY): Payer: Medicare Other

## 2018-11-17 ENCOUNTER — Inpatient Hospital Stay (HOSPITAL_COMMUNITY): Payer: Medicare Other

## 2018-11-17 ENCOUNTER — Observation Stay (HOSPITAL_COMMUNITY): Payer: Medicare Other

## 2018-11-17 ENCOUNTER — Ambulatory Visit: Payer: Self-pay | Admitting: General Surgery

## 2018-11-17 DIAGNOSIS — C3492 Malignant neoplasm of unspecified part of left bronchus or lung: Secondary | ICD-10-CM | POA: Diagnosis not present

## 2018-11-17 DIAGNOSIS — Z950 Presence of cardiac pacemaker: Secondary | ICD-10-CM | POA: Diagnosis not present

## 2018-11-17 DIAGNOSIS — C3432 Malignant neoplasm of lower lobe, left bronchus or lung: Secondary | ICD-10-CM | POA: Diagnosis not present

## 2018-11-17 DIAGNOSIS — Z510 Encounter for antineoplastic radiation therapy: Secondary | ICD-10-CM | POA: Diagnosis not present

## 2018-11-17 LAB — BASIC METABOLIC PANEL
Anion gap: 7 (ref 5–15)
BUN: 12 mg/dL (ref 8–23)
CO2: 28 mmol/L (ref 22–32)
Calcium: 8.7 mg/dL — ABNORMAL LOW (ref 8.9–10.3)
Chloride: 91 mmol/L — ABNORMAL LOW (ref 98–111)
Creatinine, Ser: 0.96 mg/dL (ref 0.61–1.24)
GFR calc Af Amer: >60 mL/min (ref >60)
GFR calc non Af Amer: >60 mL/min (ref >60)
Glucose, Bld: 83 mg/dL (ref 70–99)
Potassium: 4 mmol/L (ref 3.5–5.1)
Sodium: 126 mmol/L — ABNORMAL LOW (ref 135–145)

## 2018-11-17 LAB — CBC
HCT: 30.6 % — ABNORMAL LOW (ref 39.0–52.0)
Hemoglobin: 10 g/dL — ABNORMAL LOW (ref 13.0–17.0)
MCH: 31.5 pg (ref 26.0–34.0)
MCHC: 32.7 g/dL (ref 30.0–36.0)
MCV: 96.5 fL (ref 80.0–100.0)
Platelets: 210 10*3/uL (ref 150–400)
RBC: 3.17 MIL/uL — ABNORMAL LOW (ref 4.22–5.81)
RDW: 14.1 % (ref 11.5–15.5)
WBC: 6.4 10*3/uL (ref 4.0–10.5)
nRBC: 0 % (ref 0.0–0.2)

## 2018-11-17 LAB — MAGNESIUM: Magnesium: 1.8 mg/dL (ref 1.7–2.4)

## 2018-11-17 MED ORDER — CHLORHEXIDINE GLUCONATE CLOTH 2 % EX PADS
6.0000 | MEDICATED_PAD | Freq: Once | CUTANEOUS | Status: AC
  Administered 2018-11-17: 22:00:00 6 via TOPICAL

## 2018-11-17 MED ORDER — CEFAZOLIN SODIUM-DEXTROSE 2-4 GM/100ML-% IV SOLN
2.0000 g | INTRAVENOUS | Status: AC
  Administered 2018-11-18: 2 g via INTRAVENOUS
  Filled 2018-11-17: qty 100

## 2018-11-17 MED ORDER — CHLORHEXIDINE GLUCONATE CLOTH 2 % EX PADS: 6.0000 | MEDICATED_PAD | Freq: Once | CUTANEOUS | Status: DC

## 2018-11-17 NOTE — Evaluation (Signed)
Physical Therapy Evaluation Patient Details Name: Nicholas Sanchez MRN: [id number] DOB: 07-Apr-1933 Today's Date: 11/17/2018   History of Present Illness  Nicholas Sanchez is a 85 y.o. 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 Delton Coombes and planning to begin chemotherapy soon after port a cath placement.  He presented to ED with symptoms of coughing up blood more than usual starting earlier this morning.  He says that he has had multiple episodes of coughing up teaspoon amounts of blood which made him more concerned because the frequency has increased.  He has no chest pain and no SOB.  He denies fever and chills.  He tolerated the lung biopsy well and he is agreeable to full treatment of lung cancer.  He is supposed to start chemotherapy on Monday 9/21.   He reports SOB with ambulation.  He is on Xarelto, aspirin and plavix for his atrial fibrillation and CAD.    Clinical Impression  Patient functioning near baseline for functional mobility and gait, slightly labored cadence without loss of balance, limited secondary to fatigue, transferred to commode in bathroom and later tolerated sitting up in chair.  Patient will benefit from continued physical therapy in hospital and recommended venue below to increase strength, balance, endurance for safe ADLs and gait.     Follow Up Recommendations Home health PT;Supervision - Intermittent    Equipment Recommendations  None recommended by PT    Recommendations for Other Services       Precautions / Restrictions Precautions Precautions: Fall Restrictions Weight Bearing Restrictions: No      Mobility  Bed Mobility Overal bed mobility: Modified Independent             General bed mobility comments: increased time  Transfers Overall transfer level: Needs assistance Equipment used: Straight cane Transfers: Sit to/from Stand;Stand Pivot Transfers Sit to Stand: Supervision Stand pivot  transfers: Supervision       General transfer comment: increased time, labored movement, required much time for sitting on commode in bathroom  Ambulation/Gait Ambulation/Gait assistance: Supervision;Min guard Gait Distance (Feet): 35 Feet Assistive device: Straight cane Gait Pattern/deviations: Step-through pattern;Decreased step length - left;Decreased step length - right;Decreased stride length Gait velocity: decreased   General Gait Details: slightly labored cadence without loss of balance using SPC with step-through pattern, limited secondary to c/o fatigue and having to go to bathroom  Stairs            Wheelchair Mobility    Modified Rankin (Stroke Patients Only)       Balance Overall balance assessment: Needs assistance Sitting-balance support: Feet supported;No upper extremity supported Sitting balance-Leahy Scale: Good Sitting balance - Comments: seated at bedside   Standing balance support: During functional activity;Single extremity supported Standing balance-Leahy Scale: Fair Standing balance comment: fair/good using SPC                             Pertinent Vitals/Pain Pain Assessment: No/denies pain    Home Living Family/patient expects to be discharged to:: Private residence Living Arrangements: Spouse/significant other Available Help at Discharge: Family;Available PRN/intermittently Type of Home: House Home Access: Stairs to enter Entrance Stairs-Rails: Right;Left;Can reach both Entrance Stairs-Number of Steps: 3 Home Layout: One level Home Equipment: Cane - single point;Walker - 2 wheels      Prior Function Level of Independence: Independent with assistive device(s)         Comments: household and short distanced  community ambulator with [facility]     Hand Dominance        Extremity/Trunk Assessment   Upper Extremity Assessment Upper Extremity Assessment: Overall WFL for tasks assessed    Lower Extremity  Assessment Lower Extremity Assessment: Generalized weakness    Cervical / Trunk Assessment Cervical / Trunk Assessment: Normal  Communication   Communication: No difficulties  Cognition Arousal/Alertness: Awake/alert Behavior During Therapy: WFL for tasks assessed/performed Overall Cognitive Status: Within Functional Limits for tasks assessed                                        General Comments      Exercises     Assessment/Plan    PT Assessment Patient needs continued PT services  PT Problem List Decreased strength;Decreased activity tolerance;Decreased balance;Decreased mobility       PT Treatment Interventions Gait training;Stair training;Functional mobility training;Therapeutic activities;Balance training;Therapeutic exercise;Patient/family education    PT Goals (Current goals can be found in the Care Plan section)  Acute Rehab PT Goals Patient Stated Goal: return home with spouse to assist PT Goal Formulation: With patient Time For Goal Achievement: 11/19/18 Potential to Achieve Goals: Good    Frequency Min 2X/week   Barriers to discharge        Co-evaluation               AM-PAC PT "6 Clicks" Mobility  Outcome Measure Help needed turning from your back to your side while in a flat bed without using bedrails?: None Help needed moving from lying on your back to sitting on the side of a flat bed without using bedrails?: None Help needed moving to and from a bed to a chair (including a wheelchair)?: A Little Help needed standing up from a chair using your arms (e.g., wheelchair or bedside chair)?: None Help needed to walk in hospital room?: A Little Help needed climbing 3-5 steps with a railing? : A Little 6 Click Score: 21    End of Session   Activity Tolerance: Patient tolerated treatment well;Patient limited by fatigue Patient left: in chair;with call bell/phone within reach Nurse Communication: Mobility status PT Visit  Diagnosis: Unsteadiness on feet (R26.81);Other abnormalities of gait and mobility (R26.89);Muscle weakness (generalized) (M62.81)    Time: 2585-2778 PT Time Calculation (min) (ACUTE ONLY): 27 min   Charges:   PT Evaluation $PT Eval Moderate Complexity: 1 Mod PT Treatments $Therapeutic Activity: 23-37 mins        2:46 PM, 11/17/18 Lonell Grandchild, MPT Physical Therapist with [facility] [phone] office [phone] mobile phone

## 2018-11-17 NOTE — Consult Note (Signed)
Reason for Consult: Left lung carcinoma, need for central venous access Referring Physician: Dr. Sinda Du  Nicholas Sanchez is an 85 y.o. male.  HPI: Patient is an 86 year old white male with multiple medical problems who was admitted to the hospital yesterday evening with hemoptysis.  He had been on Xarelto, baby aspirin, Plavix.  He was supposed to see me in my office today for Port-A-Cath placement.  He has left lung carcinoma and is about to undergo chemotherapy.  He currently denies any pain.  He did not take his Xarelto yesterday.  Past Medical History:  Diagnosis Date  . CAD (coronary artery disease)    a. STEMI 04/2008 s/p BMS to prox & distal RCA, staged DES to LAD same admission. b. Nuc 03/2017: scar but no ischemia, EF 45-54%. c. 09/2017: NSTEMI - DES x 2 to proximal and mid-RCA  . Cancer ([place])   . CKD (chronic kidney disease), stage II   . Cough, persistent   . GERD (gastroesophageal reflux disease)   . History of BPH   . HTN (hypertension)   . Hypercholesteremia   . LV dysfunction    a. EF 45-50% in 01/2016.  . Mild pulmonary hypertension ([place])   . PAF (paroxysmal atrial fibrillation) ([place])   . Presence of permanent cardiac pacemaker   . Sinus node dysfunction ([place])    a. s/p StJude CRT-pacemaker 01/2016.    Past Surgical History:  Procedure Laterality Date  . CARDIAC CATHETERIZATION    . COLONOSCOPY    . COLONOSCOPY N/A 08/27/2017   Dr. [name]: 25 5-25 mm polyps in descending colon, ascending, cecum. 2X3 cm carpet polyp in base of cecum lifted away s/p piecemeal polypectomy. Tubulovillous adenoma and tubular adenomas  . CORONARY STENT INTERVENTION N/A 10/22/2017   Procedure: CORONARY STENT INTERVENTION;  Surgeon: [name], MD;  Location: [facility];  Service: Cardiovascular;  Laterality: N/A;  . CORONARY STENT PLACEMENT  04/2008   RCA and LAD  . EP IMPLANTABLE DEVICE N/A 01/09/2016   Procedure: BiV Pacemaker Insertion CRT-P;  Surgeon: [name],  MD;  Location: [facility];  Service: Cardiovascular;  Laterality: N/A;  . ESOPHAGOGASTRODUODENOSCOPY N/A 08/27/2017   normal esophagus s/p dilation, gastric petechia, small hiatal hernia, normal duodenum  . INSERT / REPLACE / REMOVE PACEMAKER    . MALONEY DILATION N/A 08/27/2017   Procedure: [name] DILATION;  Surgeon: [name], MD;  [facility];  Service: Endoscopy;  Laterality: N/A;  . RIGHT/LEFT HEART CATH AND CORONARY ANGIOGRAPHY N/A 10/21/2017   Procedure: RIGHT/LEFT HEART CATH AND CORONARY ANGIOGRAPHY;  Surgeon: [name], MD;  Location: [facility];  Service: Cardiovascular;  Laterality: N/A;    Family History  Problem Relation Age of Onset  . Emphysema Mother        mother died with emphysema and cancer  . Cancer Mother   . Prostate cancer Brother   . Heart disease Brother   . Hypothyroidism Daughter   . Hypothyroidism Daughter   . Colon cancer Neg Hx   . Colon polyps Neg Hx     Social History:  reports that he quit smoking about 45 years ago. His smoking use included cigarettes. He has a 62.50 pack-year smoking history. He quit smokeless tobacco use about 45 years ago.  His smokeless tobacco use included chew. He reports that he does not drink alcohol or use drugs.  Allergies:  Allergies  Allergen Reactions  . Ace Inhibitors Nausea Only  .  Tape Rash    And Bandaids, too    Medications: I have reviewed the patient's current medications.  Results for orders placed or performed during the hospital encounter of 11/16/18 (from the past 48 hour(s))  Comprehensive metabolic panel     Status: Abnormal   Collection Time: 11/16/18  9:39 AM  Result Value Ref Range   Sodium 125 (L) 135 - 145 mmol/L   Potassium 3.7 3.5 - 5.1 mmol/L   Chloride 90 (L) 98 - 111 mmol/L   CO2 26 22 - 32 mmol/L   Glucose, Bld 100 (H) 70 - 99 mg/dL   BUN 12 8 - 23 mg/dL   Creatinine, Ser 0.98 0.61 - 1.24 mg/dL   Calcium 9.2 8.9 - 10.3 mg/dL   Total Protein 8.2  (H) 6.5 - 8.1 g/dL   Albumin 4.2 3.5 - 5.0 g/dL   AST 19 15 - 41 U/L   ALT 14 0 - 44 U/L   Alkaline Phosphatase 61 38 - 126 U/L   Total Bilirubin 1.8 (H) 0.3 - 1.2 mg/dL   GFR calc non Af Amer >60 >60 mL/min   GFR calc Af Amer >60 >60 mL/min   Anion gap 9 5 - 15    Comment: Performed at [facility], [address], [place], [place] [address]  Troponin I (High Sensitivity)     Status: Abnormal   Collection Time: 11/16/18  9:39 AM  Result Value Ref Range   Troponin I (High Sensitivity) 35 (H) <18 ng/L    Comment: (NOTE) Elevated high sensitivity troponin I (hsTnI) values and significant  changes across serial measurements may suggest ACS but many other  chronic and acute conditions are known to elevate hsTnI results.  Refer to the "Links" section for chest pain algorithms and additional  guidance. Performed at [facility], [address] [place], [place] [address]   CBC with Differential     Status: Abnormal   Collection Time: 11/16/18  9:39 AM  Result Value Ref Range   WBC 7.8 4.0 - 10.5 K/uL   RBC 3.73 (L) 4.22 - 5.81 MIL/uL   Hemoglobin 11.6 (L) 13.0 - 17.0 g/dL   HCT 35.5 (L) 39.0 - 52.0 %   MCV 95.2 80.0 - 100.0 fL   MCH 31.1 26.0 - 34.0 pg   MCHC 32.7 30.0 - 36.0 g/dL   RDW 13.9 11.5 - 15.5 %   Platelets 238 150 - 400 K/uL   nRBC 0.0 0.0 - 0.2 %   Neutrophils Relative % 72 %   Neutro Abs 5.6 1.7 - 7.7 K/uL   Lymphocytes Relative 9 %   Lymphs Abs 0.7 0.7 - 4.0 K/uL   Monocytes Relative 7 %   Monocytes Absolute 0.5 0.1 - 1.0 K/uL   Eosinophils Relative 11 %   Eosinophils Absolute 0.9 (H) 0.0 - 0.5 K/uL   Basophils Relative 1 %   Basophils Absolute 0.1 0.0 - 0.1 K/uL   Immature Granulocytes 0 %   Abs Immature Granulocytes 0.02 0.00 - 0.07 K/uL    Comment: Performed at [facility], [address] [place], [place] [address]  Protime-INR     Status: Abnormal   Collection Time: 11/16/18  9:39 AM  Result Value Ref Range   Prothrombin Time 21.6 (H) 11.4 - 15.2 seconds    INR 1.9 (H) 0.8 - 1.2    Comment: (NOTE) INR goal varies based on device and disease states. Performed at [facility], [address] [place], [place] [address]  SARS Coronavirus 2 [facility] order, Performed in [facility] hospital lab) Nasopharyngeal Nasopharyngeal Swab     Status: None   Collection Time: 11/16/18  9:40 AM   Specimen: Nasopharyngeal Swab  Result Value Ref Range   SARS Coronavirus 2 NEGATIVE NEGATIVE    Comment: (NOTE) If result is NEGATIVE SARS-CoV-2 target nucleic acids are NOT DETECTED. The SARS-CoV-2 RNA is generally detectable in upper and lower  respiratory specimens during the acute phase of infection. The lowest  concentration of SARS-CoV-2 viral copies this assay can detect is 250  copies / mL. A negative result does not preclude SARS-CoV-2 infection  and should not be used as the sole basis for treatment or other  patient management decisions.  A negative result may occur with  improper specimen collection / handling, submission of specimen other  than nasopharyngeal swab, presence of viral mutation(s) within the  areas targeted by this assay, and inadequate number of viral copies  (<250 copies / mL). A negative result must be combined with clinical  observations, patient history, and epidemiological information. If result is POSITIVE SARS-CoV-2 target nucleic acids are DETECTED. The SARS-CoV-2 RNA is generally detectable in upper and lower  respiratory specimens dur ing the acute phase of infection.  Positive  results are indicative of active infection with SARS-CoV-2.  Clinical  correlation with patient history and other diagnostic information is  necessary to determine patient infection status.  Positive results do  not rule out bacterial infection or co-infection with other viruses. If result is PRESUMPTIVE POSTIVE SARS-CoV-2 nucleic acids MAY BE PRESENT.   A presumptive positive result was obtained on the submitted specimen  and confirmed on  repeat testing.  While 2019 novel coronavirus  (SARS-CoV-2) nucleic acids may be present in the submitted sample  additional confirmatory testing may be necessary for epidemiological  and / or clinical management purposes  to differentiate between  SARS-CoV-2 and other Sarbecovirus currently known to infect humans.  If clinically indicated additional testing with an alternate test  methodology [phone]) is advised. The SARS-CoV-2 RNA is generally  detectable in upper and lower respiratory sp ecimens during the acute  phase of infection. The expected result is Negative. Fact Sheet for Patients:  StrictlyIdeas.no Fact Sheet for Healthcare Providers: BankingDealers.co.za This test is not yet approved or cleared by the [place] FDA and has been authorized for detection and/or diagnosis of SARS-CoV-2 by FDA under an Emergency Use Authorization (EUA).  This EUA will remain in effect (meaning this test can be used) for the duration of the COVID-19 declaration under Section 564(b)(1) of the Act, 21 U.S.C. section 360bbb-3(b)(1), unless the authorization is terminated or revoked sooner. Performed at [facility], [address], [place], [place] [address]   Type and screen [facility]     Status: None   Collection Time: 11/16/18 10:18 AM  Result Value Ref Range   ABO/RH(D)      A POS Performed at [facility], [address] [place], [place] [address]    Antibody Screen      POS Performed at [facility], [address], [place], [place] [address]    Sample Expiration      11/19/2018,2359 Performed at [facility], [address], [place], [place] [address]    Antibody Identification WARM AUTOANTIBODY    DAT, IgG POS    Antibody ID,T [place]      WARM AUTOANTIBODY Performed at [facility] [address] [place], [place] [address]   Troponin  I (High Sensitivity)     Status: Abnormal   Collection Time:  11/16/18 12:05 PM  Result Value Ref Range   Troponin I (High Sensitivity) 30 (H) <18 ng/L    Comment: (NOTE) Elevated high sensitivity troponin I (hsTnI) values and significant  changes across serial measurements may suggest ACS but many other  chronic and acute conditions are known to elevate hsTnI results.  Refer to the "Links" section for chest pain algorithms and additional  guidance. Performed at [facility], [address] [place], [place] [address]   Basic metabolic panel     Status: Abnormal   Collection Time: 11/17/18  4:57 AM  Result Value Ref Range   Sodium 126 (L) 135 - 145 mmol/L   Potassium 4.0 3.5 - 5.1 mmol/L   Chloride 91 (L) 98 - 111 mmol/L   CO2 28 22 - 32 mmol/L   Glucose, Bld 83 70 - 99 mg/dL   BUN 12 8 - 23 mg/dL   Creatinine, Ser 0.96 0.61 - 1.24 mg/dL   Calcium 8.7 (L) 8.9 - 10.3 mg/dL   GFR calc non Af Amer >60 >60 mL/min   GFR calc Af Amer >60 >60 mL/min   Anion gap 7 5 - 15    Comment: Performed at [facility], [address] [place], [place] [address]  Magnesium     Status: None   Collection Time: 11/17/18  4:57 AM  Result Value Ref Range   Magnesium 1.8 1.7 - 2.4 mg/dL    Comment: Performed at [facility], [address], [place], [place] [address]  CBC     Status: Abnormal   Collection Time: 11/17/18  4:57 AM  Result Value Ref Range   WBC 6.4 4.0 - 10.5 K/uL   RBC 3.17 (L) 4.22 - 5.81 MIL/uL   Hemoglobin 10.0 (L) 13.0 - 17.0 g/dL   HCT 30.6 (L) 39.0 - 52.0 %   MCV 96.5 80.0 - 100.0 fL   MCH 31.5 26.0 - 34.0 pg   MCHC 32.7 30.0 - 36.0 g/dL   RDW 14.1 11.5 - 15.5 %   Platelets 210 150 - 400 K/uL   nRBC 0.0 0.0 - 0.2 %    Comment: Performed at [facility], [address] [place], [place] [address]    Dg Chest 2 View  Result Date: 11/16/2018 CLINICAL DATA:  85 year old male with hemoptysis. EXAM: CHEST - 2 VIEW COMPARISON:  Chest radiograph dated 10/31/2018 FINDINGS: There is diffuse chronic interstitial coarsening and mild bronchitic  changes. Bibasilar interstitial coarsening/scarring or atelectasis. An area of increased density over the right minor fissure has improved since the prior radiograph. No new consolidation. No large pleural effusion or pneumothorax. Stable cardiomegaly. Atherosclerotic calcification of the aorta. Left pectoral pacemaker device. No acute osseous pathology. IMPRESSION: Chronic changes.  No new consolidation. Electronically Signed   By: Anner Crete M.D.   On: 11/16/2018 10:23   Ct Angio Chest Pe W And/or Wo Contrast  Result Date: 11/16/2018 CLINICAL DATA:  PE suspected high pretest probability. Status post lung biopsy. Hemoptysis. EXAM: CT ANGIOGRAPHY CHEST WITH CONTRAST TECHNIQUE: Multidetector CT imaging of the chest was performed using the standard protocol during bolus administration of intravenous contrast. Multiplanar CT image reconstructions and MIPs were obtained to evaluate the vascular anatomy. CONTRAST:  119mL OMNIPAQUE IOHEXOL 350 MG/ML SOLN COMPARISON:  CT dated September 27, 2018. FINDINGS: Cardiovascular: Evaluation is limited by motion artifact.There is no pulmonary embolus. The main pulmonary artery is dilated measuring approximately 3.6 cm  in diameter the heart size is enlarged. Aortic calcifications and coronary artery calcifications are noted. There may be coronary artery stents in place. There is a left-sided pacemaker in place. Mediastinum/Nodes: --pathologically enlarged mediastinal and hilar lymph nodes are again noted. --No axillary lymphadenopathy. --No supraclavicular lymphadenopathy. --Normal thyroid gland. --The esophagus is unremarkable Lungs/Pleura: Again noted are emphysematous changes bilaterally with chronic areas of scarring and atelectasis with potential for underlying pulmonary fibrosis. There is a small left-sided pleural effusion which is new from prior study. Again noted is a left lower lobe lung mass measuring approximately 2.8 by 3 cm. There is no pneumothorax. There is some  debris within the dependent portion of the trachea. Upper Abdomen: No acute abnormality. The spleen is likely borderline enlarged but is only partially visualized on this exam. Musculoskeletal: No chest wall abnormality. No acute or significant osseous findings. Review of the MIP images confirms the above findings. IMPRESSION: 1. Study is limited by motion artifact. 2. No evidence for pulmonary embolus. 3. New small left-sided pleural effusion.  No pneumothorax. 4. Again noted is a left lower lobe 3 cm lung mass with evidence for nodal metastatic disease to the mediastinum. Aortic Atherosclerosis (ICD10-I70.0) and Emphysema (ICD10-J43.9). Electronically Signed   By: Constance Holster M.D.   On: 11/16/2018 12:28    ROS:  Pertinent items are noted in HPI.  Blood pressure 102/86, pulse 69, temperature 98.5 F (36.9 C), temperature source Oral, resp. rate 16, height 6\' 1"  (1.854 m), weight 90.7 kg, SpO2 98 %. Physical Exam: Pleasant white male no acute distress Head is normocephalic, atraumatic Patient has a pacemaker in place left upper chest.  Assessment/Plan: Impression: Left lung carcinoma, need for central venous access, chronic anticoagulation off Xarelto, pacemaker in place, history of congestive heart failure Plan: We will plan on placing Port-A-Cath tomorrow.  The risks and benefits of the procedure including bleeding, infection, and pneumothorax were fully explained to the patient, who gave informed consent.  This was discussed with both Drs. Nilda Simmer.  [name] 11/17/2018, 8:52 AM

## 2018-11-17 NOTE — H&P (View-Only) (Signed)
Reason for Consult: Left lung carcinoma, need for central venous access Referring Physician: Dr. Sinda Du  Nicholas Sanchez is an 85 y.o. male.  HPI: Patient is an 85 year old white male with multiple medical problems who was admitted to the hospital yesterday evening with hemoptysis.  He had been on Xarelto, baby aspirin, Plavix.  He was supposed to see me in my office today for Port-A-Cath placement.  He has left lung carcinoma and is about to undergo chemotherapy.  He currently denies any pain.  He did not take his Xarelto yesterday.  Past Medical History:  Diagnosis Date  . CAD (coronary artery disease)    a. STEMI 04/2008 s/p BMS to prox & distal RCA, staged DES to LAD same admission. b. Nuc 03/2017: scar but no ischemia, EF 45-54%. c. 09/2017: NSTEMI - DES x 2 to proximal and mid-RCA  . Cancer ([place])   . CKD (chronic kidney disease), stage II   . Cough, persistent   . GERD (gastroesophageal reflux disease)   . History of BPH   . HTN (hypertension)   . Hypercholesteremia   . LV dysfunction    a. EF 45-50% in 01/2016.  . Mild pulmonary hypertension ([place])   . PAF (paroxysmal atrial fibrillation) ([place])   . Presence of permanent cardiac pacemaker   . Sinus node dysfunction ([place])    a. s/p StJude CRT-pacemaker 01/2016.    Past Surgical History:  Procedure Laterality Date  . CARDIAC CATHETERIZATION    . COLONOSCOPY    . COLONOSCOPY N/A 08/27/2017   Dr. [name]: 25 5-25 mm polyps in descending colon, ascending, cecum. 2X3 cm carpet polyp in base of cecum lifted away s/p piecemeal polypectomy. Tubulovillous adenoma and tubular adenomas  . CORONARY STENT INTERVENTION N/A 10/22/2017   Procedure: CORONARY STENT INTERVENTION;  Surgeon: [name], MD;  Location: [facility];  Service: Cardiovascular;  Laterality: N/A;  . CORONARY STENT PLACEMENT  04/2008   RCA and LAD  . EP IMPLANTABLE DEVICE N/A 01/09/2016   Procedure: BiV Pacemaker Insertion CRT-P;  Surgeon: [name],  MD;  Location: [facility];  Service: Cardiovascular;  Laterality: N/A;  . ESOPHAGOGASTRODUODENOSCOPY N/A 08/27/2017   normal esophagus s/p dilation, gastric petechia, small hiatal hernia, normal duodenum  . INSERT / REPLACE / REMOVE PACEMAKER    . MALONEY DILATION N/A 08/27/2017   Procedure: [name] DILATION;  Surgeon: [name], MD;  [facility];  Service: Endoscopy;  Laterality: N/A;  . RIGHT/LEFT HEART CATH AND CORONARY ANGIOGRAPHY N/A 10/21/2017   Procedure: RIGHT/LEFT HEART CATH AND CORONARY ANGIOGRAPHY;  Surgeon: [name], MD;  Location: [facility];  Service: Cardiovascular;  Laterality: N/A;    Family History  Problem Relation Age of Onset  . Emphysema Mother        mother died with emphysema and cancer  . Cancer Mother   . Prostate cancer Brother   . Heart disease Brother   . Hypothyroidism Daughter   . Hypothyroidism Daughter   . Colon cancer Neg Hx   . Colon polyps Neg Hx     Social History:  reports that he quit smoking about 45 years ago. His smoking use included cigarettes. He has a 62.50 pack-year smoking history. He quit smokeless tobacco use about 45 years ago.  His smokeless tobacco use included chew. He reports that he does not drink alcohol or use drugs.  Allergies:  Allergies  Allergen Reactions  . Ace Inhibitors Nausea Only  .  Tape Rash    And Bandaids, too    Medications: I have reviewed the patient's current medications.  Results for orders placed or performed during the hospital encounter of 11/16/18 (from the past 48 hour(s))  Comprehensive metabolic panel     Status: Abnormal   Collection Time: 11/16/18  9:39 AM  Result Value Ref Range   Sodium 125 (L) 135 - 145 mmol/L   Potassium 3.7 3.5 - 5.1 mmol/L   Chloride 90 (L) 98 - 111 mmol/L   CO2 26 22 - 32 mmol/L   Glucose, Bld 100 (H) 70 - 99 mg/dL   BUN 12 8 - 23 mg/dL   Creatinine, Ser 0.98 0.61 - 1.24 mg/dL   Calcium 9.2 8.9 - 10.3 mg/dL   Total Protein 8.2  (H) 6.5 - 8.1 g/dL   Albumin 4.2 3.5 - 5.0 g/dL   AST 19 15 - 41 U/L   ALT 14 0 - 44 U/L   Alkaline Phosphatase 61 38 - 126 U/L   Total Bilirubin 1.8 (H) 0.3 - 1.2 mg/dL   GFR calc non Af Amer >60 >60 mL/min   GFR calc Af Amer >60 >60 mL/min   Anion gap 9 5 - 15    Comment: Performed at [facility], [address], [place], [place] [address]  Troponin I (High Sensitivity)     Status: Abnormal   Collection Time: 11/16/18  9:39 AM  Result Value Ref Range   Troponin I (High Sensitivity) 35 (H) <18 ng/L    Comment: (NOTE) Elevated high sensitivity troponin I (hsTnI) values and significant  changes across serial measurements may suggest ACS but many other  chronic and acute conditions are known to elevate hsTnI results.  Refer to the "Links" section for chest pain algorithms and additional  guidance. Performed at [facility], [address] [place], [place] [address]   CBC with Differential     Status: Abnormal   Collection Time: 11/16/18  9:39 AM  Result Value Ref Range   WBC 7.8 4.0 - 10.5 K/uL   RBC 3.73 (L) 4.22 - 5.81 MIL/uL   Hemoglobin 11.6 (L) 13.0 - 17.0 g/dL   HCT 35.5 (L) 39.0 - 52.0 %   MCV 95.2 80.0 - 100.0 fL   MCH 31.1 26.0 - 34.0 pg   MCHC 32.7 30.0 - 36.0 g/dL   RDW 13.9 11.5 - 15.5 %   Platelets 238 150 - 400 K/uL   nRBC 0.0 0.0 - 0.2 %   Neutrophils Relative % 72 %   Neutro Abs 5.6 1.7 - 7.7 K/uL   Lymphocytes Relative 9 %   Lymphs Abs 0.7 0.7 - 4.0 K/uL   Monocytes Relative 7 %   Monocytes Absolute 0.5 0.1 - 1.0 K/uL   Eosinophils Relative 11 %   Eosinophils Absolute 0.9 (H) 0.0 - 0.5 K/uL   Basophils Relative 1 %   Basophils Absolute 0.1 0.0 - 0.1 K/uL   Immature Granulocytes 0 %   Abs Immature Granulocytes 0.02 0.00 - 0.07 K/uL    Comment: Performed at [facility], [address], [place], [place] [address]  Protime-INR     Status: Abnormal   Collection Time: 11/16/18  9:39 AM  Result Value Ref Range   Prothrombin Time 21.6 (H) 11.4 - 15.2 seconds    INR 1.9 (H) 0.8 - 1.2    Comment: (NOTE) INR goal varies based on device and disease states. Performed at [facility], [address] [place], [place] [address]  SARS Coronavirus 2 [facility] order, Performed in [facility] hospital lab) Nasopharyngeal Nasopharyngeal Swab     Status: None   Collection Time: 11/16/18  9:40 AM   Specimen: Nasopharyngeal Swab  Result Value Ref Range   SARS Coronavirus 2 NEGATIVE NEGATIVE    Comment: (NOTE) If result is NEGATIVE SARS-CoV-2 target nucleic acids are NOT DETECTED. The SARS-CoV-2 RNA is generally detectable in upper and lower  respiratory specimens during the acute phase of infection. The lowest  concentration of SARS-CoV-2 viral copies this assay can detect is 250  copies / mL. A negative result does not preclude SARS-CoV-2 infection  and should not be used as the sole basis for treatment or other  patient management decisions.  A negative result may occur with  improper specimen collection / handling, submission of specimen other  than nasopharyngeal swab, presence of viral mutation(s) within the  areas targeted by this assay, and inadequate number of viral copies  (<250 copies / mL). A negative result must be combined with clinical  observations, patient history, and epidemiological information. If result is POSITIVE SARS-CoV-2 target nucleic acids are DETECTED. The SARS-CoV-2 RNA is generally detectable in upper and lower  respiratory specimens dur ing the acute phase of infection.  Positive  results are indicative of active infection with SARS-CoV-2.  Clinical  correlation with patient history and other diagnostic information is  necessary to determine patient infection status.  Positive results do  not rule out bacterial infection or co-infection with other viruses. If result is PRESUMPTIVE POSTIVE SARS-CoV-2 nucleic acids MAY BE PRESENT.   A presumptive positive result was obtained on the submitted specimen  and confirmed on  repeat testing.  While 2019 novel coronavirus  (SARS-CoV-2) nucleic acids may be present in the submitted sample  additional confirmatory testing may be necessary for epidemiological  and / or clinical management purposes  to differentiate between  SARS-CoV-2 and other Sarbecovirus currently known to infect humans.  If clinically indicated additional testing with an alternate test  methodology [phone]) is advised. The SARS-CoV-2 RNA is generally  detectable in upper and lower respiratory sp ecimens during the acute  phase of infection. The expected result is Negative. Fact Sheet for Patients:  StrictlyIdeas.no Fact Sheet for Healthcare Providers: BankingDealers.co.za This test is not yet approved or cleared by the [place] FDA and has been authorized for detection and/or diagnosis of SARS-CoV-2 by FDA under an Emergency Use Authorization (EUA).  This EUA will remain in effect (meaning this test can be used) for the duration of the COVID-19 declaration under Section 564(b)(1) of the Act, 21 U.S.C. section 360bbb-3(b)(1), unless the authorization is terminated or revoked sooner. Performed at [facility], [address], [place], [place] [address]   Type and screen [facility]     Status: None   Collection Time: 11/16/18 10:18 AM  Result Value Ref Range   ABO/RH(D)      A POS Performed at [facility], [address], [place], [place] [address]    Antibody Screen      POS Performed at [facility], [address] [place], [place] [address]    Sample Expiration      11/19/2018,2359 Performed at [facility], [address], [place], [place] [address]    Antibody Identification WARM AUTOANTIBODY    DAT, IgG POS    Antibody ID,T [place]      WARM AUTOANTIBODY Performed at [facility] [address] [place], [place] [address]   Troponin  I (High Sensitivity)     Status: Abnormal   Collection Time:  11/16/18 12:05 PM  Result Value Ref Range   Troponin I (High Sensitivity) 30 (H) <18 ng/L    Comment: (NOTE) Elevated high sensitivity troponin I (hsTnI) values and significant  changes across serial measurements may suggest ACS but many other  chronic and acute conditions are known to elevate hsTnI results.  Refer to the "Links" section for chest pain algorithms and additional  guidance. Performed at [facility], [address], [place], [place] [address]   Basic metabolic panel     Status: Abnormal   Collection Time: 11/17/18  4:57 AM  Result Value Ref Range   Sodium 126 (L) 135 - 145 mmol/L   Potassium 4.0 3.5 - 5.1 mmol/L   Chloride 91 (L) 98 - 111 mmol/L   CO2 28 22 - 32 mmol/L   Glucose, Bld 83 70 - 99 mg/dL   BUN 12 8 - 23 mg/dL   Creatinine, Ser 0.96 0.61 - 1.24 mg/dL   Calcium 8.7 (L) 8.9 - 10.3 mg/dL   GFR calc non Af Amer >60 >60 mL/min   GFR calc Af Amer >60 >60 mL/min   Anion gap 7 5 - 15    Comment: Performed at [facility], [address] [place], [place] [address]  Magnesium     Status: None   Collection Time: 11/17/18  4:57 AM  Result Value Ref Range   Magnesium 1.8 1.7 - 2.4 mg/dL    Comment: Performed at [facility], [address] [place], [place] [address]  CBC     Status: Abnormal   Collection Time: 11/17/18  4:57 AM  Result Value Ref Range   WBC 6.4 4.0 - 10.5 K/uL   RBC 3.17 (L) 4.22 - 5.81 MIL/uL   Hemoglobin 10.0 (L) 13.0 - 17.0 g/dL   HCT 30.6 (L) 39.0 - 52.0 %   MCV 96.5 80.0 - 100.0 fL   MCH 31.5 26.0 - 34.0 pg   MCHC 32.7 30.0 - 36.0 g/dL   RDW 14.1 11.5 - 15.5 %   Platelets 210 150 - 400 K/uL   nRBC 0.0 0.0 - 0.2 %    Comment: Performed at [facility], [address] [place], [place] [address]    Dg Chest 2 View  Result Date: 11/16/2018 CLINICAL DATA:  85 year old male with hemoptysis. EXAM: CHEST - 2 VIEW COMPARISON:  Chest radiograph dated 10/31/2018 FINDINGS: There is diffuse chronic interstitial coarsening and mild bronchitic  changes. Bibasilar interstitial coarsening/scarring or atelectasis. An area of increased density over the right minor fissure has improved since the prior radiograph. No new consolidation. No large pleural effusion or pneumothorax. Stable cardiomegaly. Atherosclerotic calcification of the aorta. Left pectoral pacemaker device. No acute osseous pathology. IMPRESSION: Chronic changes.  No new consolidation. Electronically Signed   By: Anner Crete M.D.   On: 11/16/2018 10:23   Ct Angio Chest Pe W And/or Wo Contrast  Result Date: 11/16/2018 CLINICAL DATA:  PE suspected high pretest probability. Status post lung biopsy. Hemoptysis. EXAM: CT ANGIOGRAPHY CHEST WITH CONTRAST TECHNIQUE: Multidetector CT imaging of the chest was performed using the standard protocol during bolus administration of intravenous contrast. Multiplanar CT image reconstructions and MIPs were obtained to evaluate the vascular anatomy. CONTRAST:  154mL OMNIPAQUE IOHEXOL 350 MG/ML SOLN COMPARISON:  CT dated September 27, 2018. FINDINGS: Cardiovascular: Evaluation is limited by motion artifact.There is no pulmonary embolus. The main pulmonary artery is dilated measuring approximately 3.6 cm  in diameter the heart size is enlarged. Aortic calcifications and coronary artery calcifications are noted. There may be coronary artery stents in place. There is a left-sided pacemaker in place. Mediastinum/Nodes: --pathologically enlarged mediastinal and hilar lymph nodes are again noted. --No axillary lymphadenopathy. --No supraclavicular lymphadenopathy. --Normal thyroid gland. --The esophagus is unremarkable Lungs/Pleura: Again noted are emphysematous changes bilaterally with chronic areas of scarring and atelectasis with potential for underlying pulmonary fibrosis. There is a small left-sided pleural effusion which is new from prior study. Again noted is a left lower lobe lung mass measuring approximately 2.8 by 3 cm. There is no pneumothorax. There is some  debris within the dependent portion of the trachea. Upper Abdomen: No acute abnormality. The spleen is likely borderline enlarged but is only partially visualized on this exam. Musculoskeletal: No chest wall abnormality. No acute or significant osseous findings. Review of the MIP images confirms the above findings. IMPRESSION: 1. Study is limited by motion artifact. 2. No evidence for pulmonary embolus. 3. New small left-sided pleural effusion.  No pneumothorax. 4. Again noted is a left lower lobe 3 cm lung mass with evidence for nodal metastatic disease to the mediastinum. Aortic Atherosclerosis (ICD10-I70.0) and Emphysema (ICD10-J43.9). Electronically Signed   By: Constance Holster M.D.   On: 11/16/2018 12:28    ROS:  Pertinent items are noted in HPI.  Blood pressure 102/86, pulse 69, temperature 98.5 F (36.9 C), temperature source Oral, resp. rate 16, height 6\' 1"  (1.854 m), weight 90.7 kg, SpO2 98 %. Physical Exam: Pleasant white male no acute distress Head is normocephalic, atraumatic Patient has a pacemaker in place left upper chest.  Assessment/Plan: Impression: Left lung carcinoma, need for central venous access, chronic anticoagulation off Xarelto, pacemaker in place, history of congestive heart failure Plan: We will plan on placing Port-A-Cath tomorrow.  The risks and benefits of the procedure including bleeding, infection, and pneumothorax were fully explained to the patient, who gave informed consent.  This was discussed with both Drs. Nilda Simmer.  [name] 11/17/2018, 8:52 AM

## 2018-11-17 NOTE — Progress Notes (Signed)
Subjective: He was admitted with hemoptysis.  He is known to have lung cancer and he has been anticoagulated and I think the 2 together have caused him to have hemoptysis.  This is better.  His anticoagulation has been held.  He has been on Xarelto plus aspirin plus Plavix.  He is known to have coronary disease paroxysmal atrial fib.  Objective: Vital signs in last 24 hours: Temp:  [97.6 F (36.4 C)-98.5 F (36.9 C)] 98.5 F (36.9 C) (09/16 2133) Pulse Rate:  [69-74] 69 (09/16 2133) Resp:  [16-31] 16 (09/16 2133) BP: (102-165)/(56-86) 102/86 (09/16 2133) SpO2:  [98 %-100 %] 98 % (09/16 2133) Weight:  [90.7 kg] 90.7 kg (09/16 0925) Weight change:  Last BM Date: 11/17/18  Intake/Output from previous day: 09/16 0701 - 09/17 0700 In: 946.7 [P.O.:360; I.V.:586.7] Out: 100 [Urine:100]  PHYSICAL EXAM General appearance: alert, cooperative and no distress Resp: rhonchi bilaterally Cardio: regular rate and rhythm, S1, S2 normal, no murmur, click, rub or gallop GI: soft, non-tender; bowel sounds normal; no masses,  no organomegaly Extremities: extremities normal, atraumatic, no cyanosis or edema  Lab Results:  Results for orders placed or performed during the hospital encounter of 11/16/18 (from the past 48 hour(s))  Comprehensive metabolic panel     Status: Abnormal   Collection Time: 11/16/18  9:39 AM  Result Value Ref Range   Sodium 125 (L) 135 - 145 mmol/L   Potassium 3.7 3.5 - 5.1 mmol/L   Chloride 90 (L) 98 - 111 mmol/L   CO2 26 22 - 32 mmol/L   Glucose, Bld 100 (H) 70 - 99 mg/dL   BUN 12 8 - 23 mg/dL   Creatinine, Ser 0.98 0.61 - 1.24 mg/dL   Calcium 9.2 8.9 - 10.3 mg/dL   Total Protein 8.2 (H) 6.5 - 8.1 g/dL   Albumin 4.2 3.5 - 5.0 g/dL   AST 19 15 - 41 U/L   ALT 14 0 - 44 U/L   Alkaline Phosphatase 61 38 - 126 U/L   Total Bilirubin 1.8 (H) 0.3 - 1.2 mg/dL   GFR calc non Af Amer >60 >60 mL/min   GFR calc Af Amer >60 >60 mL/min   Anion gap 9 5 - 15    Comment:  Performed at [facility], [address], [place], [place] [address]  Troponin I (High Sensitivity)     Status: Abnormal   Collection Time: 11/16/18  9:39 AM  Result Value Ref Range   Troponin I (High Sensitivity) 35 (H) <18 ng/L    Comment: (NOTE) Elevated high sensitivity troponin I (hsTnI) values and significant  changes across serial measurements may suggest ACS but many other  chronic and acute conditions are known to elevate hsTnI results.  Refer to the "Links" section for chest pain algorithms and additional  guidance. Performed at [facility], [address] [place], [place] [address]   CBC with Differential     Status: Abnormal   Collection Time: 11/16/18  9:39 AM  Result Value Ref Range   WBC 7.8 4.0 - 10.5 K/uL   RBC 3.73 (L) 4.22 - 5.81 MIL/uL   Hemoglobin 11.6 (L) 13.0 - 17.0 g/dL   HCT 35.5 (L) 39.0 - 52.0 %   MCV 95.2 80.0 - 100.0 fL   MCH 31.1 26.0 - 34.0 pg   MCHC 32.7 30.0 - 36.0 g/dL   RDW 13.9 11.5 - 15.5 %   Platelets 238 150 - 400 K/uL   nRBC 0.0 0.0 - 0.2 %  Neutrophils Relative % 72 %   Neutro Abs 5.6 1.7 - 7.7 K/uL   Lymphocytes Relative 9 %   Lymphs Abs 0.7 0.7 - 4.0 K/uL   Monocytes Relative 7 %   Monocytes Absolute 0.5 0.1 - 1.0 K/uL   Eosinophils Relative 11 %   Eosinophils Absolute 0.9 (H) 0.0 - 0.5 K/uL   Basophils Relative 1 %   Basophils Absolute 0.1 0.0 - 0.1 K/uL   Immature Granulocytes 0 %   Abs Immature Granulocytes 0.02 0.00 - 0.07 K/uL    Comment: Performed at [facility], [address] [place], [place] [address]  Protime-INR     Status: Abnormal   Collection Time: 11/16/18  9:39 AM  Result Value Ref Range   Prothrombin Time 21.6 (H) 11.4 - 15.2 seconds   INR 1.9 (H) 0.8 - 1.2    Comment: (NOTE) INR goal varies based on device and disease states. Performed at [facility], [address], [place], [place] [address]   SARS Coronavirus 2 [facility] order, Performed in [facility] hospital lab) Nasopharyngeal Nasopharyngeal Swab      Status: None   Collection Time: 11/16/18  9:40 AM   Specimen: Nasopharyngeal Swab  Result Value Ref Range   SARS Coronavirus 2 NEGATIVE NEGATIVE    Comment: (NOTE) If result is NEGATIVE SARS-CoV-2 target nucleic acids are NOT DETECTED. The SARS-CoV-2 RNA is generally detectable in upper and lower  respiratory specimens during the acute phase of infection. The lowest  concentration of SARS-CoV-2 viral copies this assay can detect is 250  copies / mL. A negative result does not preclude SARS-CoV-2 infection  and should not be used as the sole basis for treatment or other  patient management decisions.  A negative result may occur with  improper specimen collection / handling, submission of specimen other  than nasopharyngeal swab, presence of viral mutation(s) within the  areas targeted by this assay, and inadequate number of viral copies  (<250 copies / mL). A negative result must be combined with clinical  observations, patient history, and epidemiological information. If result is POSITIVE SARS-CoV-2 target nucleic acids are DETECTED. The SARS-CoV-2 RNA is generally detectable in upper and lower  respiratory specimens dur ing the acute phase of infection.  Positive  results are indicative of active infection with SARS-CoV-2.  Clinical  correlation with patient history and other diagnostic information is  necessary to determine patient infection status.  Positive results do  not rule out bacterial infection or co-infection with other viruses. If result is PRESUMPTIVE POSTIVE SARS-CoV-2 nucleic acids MAY BE PRESENT.   A presumptive positive result was obtained on the submitted specimen  and confirmed on repeat testing.  While 2019 novel coronavirus  (SARS-CoV-2) nucleic acids may be present in the submitted sample  additional confirmatory testing may be necessary for epidemiological  and / or clinical management purposes  to differentiate between  SARS-CoV-2 and other Sarbecovirus  currently known to infect humans.  If clinically indicated additional testing with an alternate test  methodology [phone]) is advised. The SARS-CoV-2 RNA is generally  detectable in upper and lower respiratory sp ecimens during the acute  phase of infection. The expected result is Negative. Fact Sheet for Patients:  StrictlyIdeas.no Fact Sheet for Healthcare Providers: BankingDealers.co.za This test is not yet approved or cleared by the [place] FDA and has been authorized for detection and/or diagnosis of SARS-CoV-2 by FDA under an Emergency Use Authorization (EUA).  This EUA will remain in effect (meaning this  test can be used) for the duration of the COVID-19 declaration under Section 564(b)(1) of the Act, 21 U.S.C. section 360bbb-3(b)(1), unless the authorization is terminated or revoked sooner. Performed at [facility], [address], [place], [place] [address]   Type and screen [facility]     Status: None   Collection Time: 11/16/18 10:18 AM  Result Value Ref Range   ABO/RH(D)      A POS Performed at [facility], [address] [place], [place] [address]    Antibody Screen      POS Performed at [facility], [address], [place], [place] [address]    Sample Expiration      11/19/2018,2359 Performed at [facility], [address] [place], [place] [address]    Antibody Identification WARM AUTOANTIBODY    DAT, IgG POS    Antibody ID,T [place]      WARM AUTOANTIBODY Performed at [facility] [address], [place], [place] [address]   Troponin I (High Sensitivity)     Status: Abnormal   Collection Time: 11/16/18 12:05 PM  Result Value Ref Range   Troponin I (High Sensitivity) 30 (H) <18 ng/L    Comment: (NOTE) Elevated high sensitivity troponin I (hsTnI) values and significant  changes across serial measurements may suggest ACS but many other  chronic and acute conditions are known  to elevate hsTnI results.  Refer to the "Links" section for chest pain algorithms and additional  guidance. Performed at [facility], [address] [place], [place] [address]   Basic metabolic panel     Status: Abnormal   Collection Time: 11/17/18  4:57 AM  Result Value Ref Range   Sodium 126 (L) 135 - 145 mmol/L   Potassium 4.0 3.5 - 5.1 mmol/L   Chloride 91 (L) 98 - 111 mmol/L   CO2 28 22 - 32 mmol/L   Glucose, Bld 83 70 - 99 mg/dL   BUN 12 8 - 23 mg/dL   Creatinine, Ser 0.96 0.61 - 1.24 mg/dL   Calcium 8.7 (L) 8.9 - 10.3 mg/dL   GFR calc non Af Amer >60 >60 mL/min   GFR calc Af Amer >60 >60 mL/min   Anion gap 7 5 - 15    Comment: Performed at [facility], [address], [place], [place] [address]  Magnesium     Status: None   Collection Time: 11/17/18  4:57 AM  Result Value Ref Range   Magnesium 1.8 1.7 - 2.4 mg/dL    Comment: Performed at [facility], [address], [place], [place] [address]  CBC     Status: Abnormal   Collection Time: 11/17/18  4:57 AM  Result Value Ref Range   WBC 6.4 4.0 - 10.5 K/uL   RBC 3.17 (L) 4.22 - 5.81 MIL/uL   Hemoglobin 10.0 (L) 13.0 - 17.0 g/dL   HCT 30.6 (L) 39.0 - 52.0 %   MCV 96.5 80.0 - 100.0 fL   MCH 31.5 26.0 - 34.0 pg   MCHC 32.7 30.0 - 36.0 g/dL   RDW 14.1 11.5 - 15.5 %   Platelets 210 150 - 400 K/uL   nRBC 0.0 0.0 - 0.2 %    Comment: Performed at [facility], [address] [place], [place] [address]    ABGS No results for input(s): PHART, PO2ART, TCO2, HCO3 in the last 72 hours.  Invalid input(s): PCO2 CULTURES Recent Results (from the past 240 hour(s))  SARS Coronavirus 2 [facility] order, Performed  in [place] lab) Nasopharyngeal Nasopharyngeal Swab     Status: None   Collection Time: 11/16/18  9:40 AM   Specimen: Nasopharyngeal Swab  Result Value Ref Range Status   SARS Coronavirus 2 NEGATIVE NEGATIVE Final    Comment: (NOTE) If result is NEGATIVE SARS-CoV-2 target nucleic acids are NOT  DETECTED. The SARS-CoV-2 RNA is generally detectable in upper and lower  respiratory specimens during the acute phase of infection. The lowest  concentration of SARS-CoV-2 viral copies this assay can detect is 250  copies / mL. A negative result does not preclude SARS-CoV-2 infection  and should not be used as the sole basis for treatment or other  patient management decisions.  A negative result may occur with  improper specimen collection / handling, submission of specimen other  than nasopharyngeal swab, presence of viral mutation(s) within the  areas targeted by this assay, and inadequate number of viral copies  (<250 copies / mL). A negative result must be combined with clinical  observations, patient history, and epidemiological information. If result is POSITIVE SARS-CoV-2 target nucleic acids are DETECTED. The SARS-CoV-2 RNA is generally detectable in upper and lower  respiratory specimens dur ing the acute phase of infection.  Positive  results are indicative of active infection with SARS-CoV-2.  Clinical  correlation with patient history and other diagnostic information is  necessary to determine patient infection status.  Positive results do  not rule out bacterial infection or co-infection with other viruses. If result is PRESUMPTIVE POSTIVE SARS-CoV-2 nucleic acids MAY BE PRESENT.   A presumptive positive result was obtained on the submitted specimen  and confirmed on repeat testing.  While 2019 novel coronavirus  (SARS-CoV-2) nucleic acids may be present in the submitted sample  additional confirmatory testing may be necessary for epidemiological  and / or clinical management purposes  to differentiate between  SARS-CoV-2 and other Sarbecovirus currently known to infect humans.  If clinically indicated additional testing with an alternate test  methodology [phone]) is advised. The SARS-CoV-2 RNA is generally  detectable in upper and lower respiratory sp ecimens during  the acute  phase of infection. The expected result is Negative. Fact Sheet for Patients:  StrictlyIdeas.no Fact Sheet for Healthcare Providers: BankingDealers.co.za This test is not yet approved or cleared by the [place] FDA and has been authorized for detection and/or diagnosis of SARS-CoV-2 by FDA under an Emergency Use Authorization (EUA).  This EUA will remain in effect (meaning this test can be used) for the duration of the COVID-19 declaration under Section 564(b)(1) of the Act, 21 U.S.C. section 360bbb-3(b)(1), unless the authorization is terminated or revoked sooner. Performed at [facility], [address] [place], [place] [address]    Studies/Results: Dg Chest 2 View  Result Date: 11/16/2018 CLINICAL DATA:  85 year old male with hemoptysis. EXAM: CHEST - 2 VIEW COMPARISON:  Chest radiograph dated 10/31/2018 FINDINGS: There is diffuse chronic interstitial coarsening and mild bronchitic changes. Bibasilar interstitial coarsening/scarring or atelectasis. An area of increased density over the right minor fissure has improved since the prior radiograph. No new consolidation. No large pleural effusion or pneumothorax. Stable cardiomegaly. Atherosclerotic calcification of the aorta. Left pectoral pacemaker device. No acute osseous pathology. IMPRESSION: Chronic changes.  No new consolidation. Electronically Signed   By: Anner Crete M.D.   On: 11/16/2018 10:23   Ct Angio Chest Pe W And/or Wo Contrast  Result Date: 11/16/2018 CLINICAL DATA:  PE suspected high pretest probability. Status post lung biopsy. Hemoptysis. EXAM: CT  ANGIOGRAPHY CHEST WITH CONTRAST TECHNIQUE: Multidetector CT imaging of the chest was performed using the standard protocol during bolus administration of intravenous contrast. Multiplanar CT image reconstructions and MIPs were obtained to evaluate the vascular anatomy. CONTRAST:  11mL OMNIPAQUE IOHEXOL 350 MG/ML  SOLN COMPARISON:  CT dated September 27, 2018. FINDINGS: Cardiovascular: Evaluation is limited by motion artifact.There is no pulmonary embolus. The main pulmonary artery is dilated measuring approximately 3.6 cm in diameter the heart size is enlarged. Aortic calcifications and coronary artery calcifications are noted. There may be coronary artery stents in place. There is a left-sided pacemaker in place. Mediastinum/Nodes: --pathologically enlarged mediastinal and hilar lymph nodes are again noted. --No axillary lymphadenopathy. --No supraclavicular lymphadenopathy. --Normal thyroid gland. --The esophagus is unremarkable Lungs/Pleura: Again noted are emphysematous changes bilaterally with chronic areas of scarring and atelectasis with potential for underlying pulmonary fibrosis. There is a small left-sided pleural effusion which is new from prior study. Again noted is a left lower lobe lung mass measuring approximately 2.8 by 3 cm. There is no pneumothorax. There is some debris within the dependent portion of the trachea. Upper Abdomen: No acute abnormality. The spleen is likely borderline enlarged but is only partially visualized on this exam. Musculoskeletal: No chest wall abnormality. No acute or significant osseous findings. Review of the MIP images confirms the above findings. IMPRESSION: 1. Study is limited by motion artifact. 2. No evidence for pulmonary embolus. 3. New small left-sided pleural effusion.  No pneumothorax. 4. Again noted is a left lower lobe 3 cm lung mass with evidence for nodal metastatic disease to the mediastinum. Aortic Atherosclerosis (ICD10-I70.0) and Emphysema (ICD10-J43.9). Electronically Signed   By: Constance Holster M.D.   On: 11/16/2018 12:28    Medications:  Prior to Admission:  Medications Prior to Admission  Medication Sig Dispense Refill Last Dose  . acetaminophen (TYLENOL) 500 MG tablet Take 500 mg by mouth every 6 (six) hours as needed for pain.   11/15/2018 at Unknown  time  . aspirin EC 81 MG tablet Take 81 mg by mouth daily.   11/16/2018 at Unknown time  . cholecalciferol (VITAMIN D) 1000 units tablet Take 1,000 Units by mouth 2 (two) times daily.   11/15/2018 at Unknown time  . clopidogrel (PLAVIX) 75 MG tablet TAKE ONE (1) TABLET BY MOUTH EVERY DAY 90 tablet 3 11/15/2018 at 2230  . dorzolamide-timolol (COSOPT) 22.3-6.8 MG/ML ophthalmic solution Place 1 drop into the left eye 2 (two) times daily.   11/16/2018 at Unknown time  . ferrous sulfate 325 (65 FE) MG tablet Take 325 mg by mouth 3 (three) times daily.   11/15/2018 at Unknown time  . hydrochlorothiazide (HYDRODIURIL) 25 MG tablet TAKE ONE (1) TABLET BY MOUTH EVERY DAY (Patient taking differently: Take 25 mg by mouth daily. ) 90 tablet 3 11/16/2018 at Unknown time  . losartan (COZAAR) 25 MG tablet TAKE ONE TABLET BY MOUTY EVERY DAY (Patient taking differently: Take 25 mg by mouth daily. ) 90 tablet 1 11/15/2018 at Unknown time  . lovastatin (MEVACOR) 10 MG tablet TAKE ONE (1) TABLET BY MOUTH EVERY DAY 90 tablet 3 11/15/2018 at Unknown time  . metoprolol succinate (TOPROL XL) 25 MG 24 hr tablet Take 1 tablet (25 mg total) by mouth daily. 90 tablet 3 11/16/2018 at 0730  . pantoprazole (PROTONIX) 40 MG tablet Take 40 mg by mouth daily.   11/15/2018 at Unknown time  . Propylene Glycol (SYSTANE BALANCE OP) Place 1 drop into both eyes 2 (two) times daily  as needed (dry eyes).    11/15/2018 at Unknown time  . rivaroxaban (XARELTO) 20 MG TABS tablet Take 1 tablet (20 mg total) by mouth daily with supper. Dx: paroxysmal atrial fibrillation 90 tablet 3 11/15/2018 at 2230  . tamsulosin (FLOMAX) 0.4 MG CAPS capsule Take 0.4 mg by mouth daily.   11/15/2018 at Unknown time  . triamcinolone cream (KENALOG) 0.1 % Apply 1 application topically daily as needed (itching).    Past Week at Unknown time  . vitamin B-12 (CYANOCOBALAMIN) 1000 MCG tablet Take 1,000 mcg by mouth daily.   11/15/2018 at Unknown time  . [START ON 11/21/2018]  CARBOPLATIN IV Inject into the vein once a week.     . lidocaine-prilocaine (EMLA) cream Apply a dime-sized amount to port a cath site and cover with plastic wrap one hour prior to chemotherapy appointments 30 g 0   . [START ON 11/21/2018] PACLITAXEL IV Inject into the vein once a week.     . prochlorperazine (COMPAZINE) 10 MG tablet Take 1 tablet (10 mg total) by mouth every 6 (six) hours as needed for nausea or vomiting. 30 tablet 1    Scheduled: . cholecalciferol  1,000 Units Oral BID  . dorzolamide-timolol  1 drop Left Eye BID  . ferrous sulfate  325 mg Oral Q breakfast  . losartan  25 mg Oral Daily  . metoprolol succinate  25 mg Oral Daily  . pantoprazole  40 mg Oral Daily  . pravastatin  10 mg Oral q1800  . tamsulosin  0.4 mg Oral QHS   Continuous: . sodium chloride 50 mL/hr at 11/16/18 1516   WSF:[id number] **OR** acetaminophen, ondansetron **OR** ondansetron (ZOFRAN) IV, oxyCODONE, polyvinyl alcohol, senna-docusate, traZODone  Assesment: He was admitted with hemoptysis.  This is not massive.  It is slowing down.  His anticoagulation has been held.  He has squamous cell cancer of the lung and is being set up for chemotherapy and potentially radiation.  He was scheduled to see Dr. Arnoldo Morale in the office today so I am going ahead and consulted Dr. Arnoldo Morale and he will be probably set up for Port-A-Cath tomorrow  He has hyponatremia likely related to his lung cancer.  He does have coronary disease and he was on dual antiplatelet therapy which is being held right now.  He had paroxysmal atrial fib and had been on Xarelto which is being held Principal Problem:   Hemoptysis Active Problems:   HTN (hypertension)   Hypercholesteremia   GERD (gastroesophageal reflux disease)   CAD (coronary artery disease)   Paroxysmal atrial fibrillation (HCC)   IDA (iron deficiency anemia)   Dysphagia   Angina at rest [facility])   Squamous cell carcinoma of lung, left (HCC)   Anticoagulant  long-term use   Hyponatremia    Plan: Continue to hold anticoagulation.  We will get cardiology to advise on what we need to do with anticoagulation longer-term.  Consult general surgery for Port-A-Cath    LOS: 0 days   Nicholas Sanchez 11/17/2018, 8:35 AM

## 2018-11-17 NOTE — Care Management Obs Status (Signed)
[facility] NOTIFICATION   Patient Details  Name: Nicholas Sanchez MRN: [id number] Date of Birth: [birth year]   Medicare Observation Status Notification Given:  Yes    Trish Mage, LCSW 11/17/2018, 1:12 PM

## 2018-11-17 NOTE — Plan of Care (Signed)
  Problem: Acute Rehab PT Goals(only PT should resolve) Goal: Patient Will Transfer Sit To/From Stand Outcome: Progressing Flowsheets (Taken 11/17/2018 1448) Patient will transfer sit to/from stand: with modified independence Goal: Pt Will Transfer Bed To Chair/Chair To Bed Outcome: Progressing Flowsheets (Taken 11/17/2018 1448) Pt will Transfer Bed to Chair/Chair to Bed: with modified independence Goal: Pt Will Ambulate Outcome: Progressing Flowsheets (Taken 11/17/2018 1448) Pt will Ambulate:  75 feet  with supervision  with modified independence  with cane   2:49 PM, 11/17/18 Lonell Grandchild, MPT Physical Therapist with [facility] [phone] office [phone] mobile phone

## 2018-11-18 ENCOUNTER — Inpatient Hospital Stay (HOSPITAL_COMMUNITY): Payer: Medicare Other | Admitting: Anesthesiology

## 2018-11-18 ENCOUNTER — Inpatient Hospital Stay (HOSPITAL_COMMUNITY): Payer: Medicare Other

## 2018-11-18 ENCOUNTER — Encounter (HOSPITAL_COMMUNITY)
Admission: EM | Disposition: A | Discharge status: Home / Self Care | Payer: Self-pay | Source: Home / Self Care | Attending: Pulmonary Disease

## 2018-11-18 DIAGNOSIS — I252 Old myocardial infarction: Secondary | ICD-10-CM | POA: Diagnosis not present

## 2018-11-18 DIAGNOSIS — E871 Hypo-osmolality and hyponatremia: Secondary | ICD-10-CM | POA: Diagnosis not present

## 2018-11-18 DIAGNOSIS — Z87891 Personal history of nicotine dependence: Secondary | ICD-10-CM | POA: Diagnosis not present

## 2018-11-18 DIAGNOSIS — E7800 Pure hypercholesterolemia, unspecified: Secondary | ICD-10-CM | POA: Diagnosis present

## 2018-11-18 DIAGNOSIS — C3490 Malignant neoplasm of unspecified part of unspecified bronchus or lung: Secondary | ICD-10-CM | POA: Diagnosis not present

## 2018-11-18 DIAGNOSIS — Z950 Presence of cardiac pacemaker: Secondary | ICD-10-CM | POA: Diagnosis not present

## 2018-11-18 DIAGNOSIS — Z825 Family history of asthma and other chronic lower respiratory diseases: Secondary | ICD-10-CM | POA: Diagnosis not present

## 2018-11-18 DIAGNOSIS — I2510 Atherosclerotic heart disease of native coronary artery without angina pectoris: Secondary | ICD-10-CM | POA: Diagnosis not present

## 2018-11-18 DIAGNOSIS — I495 Sick sinus syndrome: Secondary | ICD-10-CM | POA: Diagnosis not present

## 2018-11-18 DIAGNOSIS — Z8042 Family history of malignant neoplasm of prostate: Secondary | ICD-10-CM | POA: Diagnosis not present

## 2018-11-18 DIAGNOSIS — C3432 Malignant neoplasm of lower lobe, left bronchus or lung: Secondary | ICD-10-CM | POA: Diagnosis not present

## 2018-11-18 DIAGNOSIS — R1310 Dysphagia, unspecified: Secondary | ICD-10-CM | POA: Diagnosis present

## 2018-11-18 DIAGNOSIS — I129 Hypertensive chronic kidney disease with stage 1 through stage 4 chronic kidney disease, or unspecified chronic kidney disease: Secondary | ICD-10-CM | POA: Diagnosis not present

## 2018-11-18 DIAGNOSIS — Z20828 Contact with and (suspected) exposure to other viral communicable diseases: Secondary | ICD-10-CM | POA: Diagnosis not present

## 2018-11-18 DIAGNOSIS — Z452 Encounter for adjustment and management of vascular access device: Secondary | ICD-10-CM | POA: Diagnosis not present

## 2018-11-18 DIAGNOSIS — N400 Enlarged prostate without lower urinary tract symptoms: Secondary | ICD-10-CM | POA: Diagnosis present

## 2018-11-18 DIAGNOSIS — D509 Iron deficiency anemia, unspecified: Secondary | ICD-10-CM | POA: Diagnosis present

## 2018-11-18 DIAGNOSIS — Z86010 Personal history of colonic polyps: Secondary | ICD-10-CM | POA: Diagnosis not present

## 2018-11-18 DIAGNOSIS — E785 Hyperlipidemia, unspecified: Secondary | ICD-10-CM | POA: Diagnosis present

## 2018-11-18 DIAGNOSIS — D62 Acute posthemorrhagic anemia: Secondary | ICD-10-CM | POA: Diagnosis not present

## 2018-11-18 DIAGNOSIS — Z955 Presence of coronary angioplasty implant and graft: Secondary | ICD-10-CM | POA: Diagnosis not present

## 2018-11-18 DIAGNOSIS — C3492 Malignant neoplasm of unspecified part of left bronchus or lung: Secondary | ICD-10-CM | POA: Diagnosis not present

## 2018-11-18 DIAGNOSIS — N182 Chronic kidney disease, stage 2 (mild): Secondary | ICD-10-CM | POA: Diagnosis not present

## 2018-11-18 DIAGNOSIS — I272 Pulmonary hypertension, unspecified: Secondary | ICD-10-CM | POA: Diagnosis not present

## 2018-11-18 DIAGNOSIS — K219 Gastro-esophageal reflux disease without esophagitis: Secondary | ICD-10-CM | POA: Diagnosis present

## 2018-11-18 DIAGNOSIS — I480 Paroxysmal atrial fibrillation: Secondary | ICD-10-CM | POA: Diagnosis not present

## 2018-11-18 DIAGNOSIS — R042 Hemoptysis: Secondary | ICD-10-CM | POA: Diagnosis not present

## 2018-11-18 HISTORY — PX: PORTACATH PLACEMENT: SHX2246

## 2018-11-18 LAB — BASIC METABOLIC PANEL
Anion gap: 6 (ref 5–15)
BUN: 13 mg/dL (ref 8–23)
CO2: 25 mmol/L (ref 22–32)
Calcium: 8.4 mg/dL — ABNORMAL LOW (ref 8.9–10.3)
Chloride: 94 mmol/L — ABNORMAL LOW (ref 98–111)
Creatinine, Ser: 0.94 mg/dL (ref 0.61–1.24)
GFR calc Af Amer: >60 mL/min (ref >60)
GFR calc non Af Amer: >60 mL/min (ref >60)
Glucose, Bld: 87 mg/dL (ref 70–99)
Potassium: 4 mmol/L (ref 3.5–5.1)
Sodium: 125 mmol/L — ABNORMAL LOW (ref 135–145)

## 2018-11-18 LAB — CBC WITH DIFFERENTIAL/PLATELET
Abs Immature Granulocytes: 0.02 10*3/uL (ref 0.00–0.07)
Basophils Absolute: 0 10*3/uL (ref 0.0–0.1)
Basophils Relative: 1 %
Eosinophils Absolute: 0.5 10*3/uL (ref 0.0–0.5)
Eosinophils Relative: 10 %
HCT: 29.4 % — ABNORMAL LOW (ref 39.0–52.0)
Hemoglobin: 9.7 g/dL — ABNORMAL LOW (ref 13.0–17.0)
Immature Granulocytes: 0 %
Lymphocytes Relative: 12 %
Lymphs Abs: 0.7 10*3/uL (ref 0.7–4.0)
MCH: 31.5 pg (ref 26.0–34.0)
MCHC: 33 g/dL (ref 30.0–36.0)
MCV: 95.5 fL (ref 80.0–100.0)
Monocytes Absolute: 0.5 10*3/uL (ref 0.1–1.0)
Monocytes Relative: 9 %
Neutro Abs: 3.6 10*3/uL (ref 1.7–7.7)
Neutrophils Relative %: 68 %
Platelets: 195 10*3/uL (ref 150–400)
RBC: 3.08 MIL/uL — ABNORMAL LOW (ref 4.22–5.81)
RDW: 14 % (ref 11.5–15.5)
WBC: 5.4 10*3/uL (ref 4.0–10.5)
nRBC: 0 % (ref 0.0–0.2)

## 2018-11-18 SURGERY — INSERTION, TUNNELED CENTRAL VENOUS DEVICE, WITH PORT
Anesthesia: Monitor Anesthesia Care | Site: Chest | Laterality: Right

## 2018-11-18 MED ORDER — LIDOCAINE HCL (PF) 1 % IJ SOLN
INTRAMUSCULAR | Status: AC
  Filled 2018-11-18: qty 30

## 2018-11-18 MED ORDER — SODIUM CHLORIDE (PF) 0.9 % IJ SOLN
INTRAMUSCULAR | Status: DC | PRN
  Administered 2018-11-18: 10 mL via INTRAVENOUS

## 2018-11-18 MED ORDER — PROPOFOL 10 MG/ML IV BOLUS
INTRAVENOUS | Status: DC | PRN
  Administered 2018-11-18: 20 mg via INTRAVENOUS

## 2018-11-18 MED ORDER — CEFAZOLIN SODIUM-DEXTROSE 2-4 GM/100ML-% IV SOLN
INTRAVENOUS | Status: AC
  Filled 2018-11-18: qty 100

## 2018-11-18 MED ORDER — FENTANYL CITRATE (PF) 100 MCG/2ML IJ SOLN: 25.0000 ug | INTRAMUSCULAR | Status: DC | PRN

## 2018-11-18 MED ORDER — LACTATED RINGERS IV SOLN
Freq: Once | INTRAVENOUS | Status: AC
  Administered 2018-11-18: 10:00:00 via INTRAVENOUS

## 2018-11-18 MED ORDER — PROPOFOL 10 MG/ML IV BOLUS
INTRAVENOUS | Status: AC
  Filled 2018-11-18: qty 20

## 2018-11-18 MED ORDER — HEPARIN SOD (PORK) LOCK FLUSH 100 UNIT/ML IV SOLN
INTRAVENOUS | Status: AC
  Filled 2018-11-18: qty 5

## 2018-11-18 MED ORDER — PROPOFOL 500 MG/50ML IV EMUL
INTRAVENOUS | Status: DC | PRN
  Administered 2018-11-18: 100 ug/kg/min via INTRAVENOUS

## 2018-11-18 MED ORDER — LIDOCAINE HCL (PF) 1 % IJ SOLN
INTRAMUSCULAR | Status: DC | PRN
  Administered 2018-11-18: 8 mL

## 2018-11-18 MED ORDER — HEPARIN SOD (PORK) LOCK FLUSH 100 UNIT/ML IV SOLN
INTRAVENOUS | Status: DC | PRN
  Administered 2018-11-18: 500 [IU] via INTRAVENOUS

## 2018-11-18 SURGICAL SUPPLY — 30 items
APPLICATOR CHLORAPREP 10.5 ORG (MISCELLANEOUS) ×3 IMPLANT
BAG DECANTER FOR FLEXI CONT (MISCELLANEOUS) ×3 IMPLANT
CLOTH BEACON ORANGE TIMEOUT ST (SAFETY) ×3 IMPLANT
COVER LIGHT HANDLE STERIS (MISCELLANEOUS) ×6 IMPLANT
COVER WAND RF STERILE (DRAPES) ×3 IMPLANT
DECANTER SPIKE VIAL GLASS SM (MISCELLANEOUS) ×3 IMPLANT
DERMABOND ADVANCED (GAUZE/BANDAGES/DRESSINGS) ×2
DERMABOND ADVANCED .7 DNX12 (GAUZE/BANDAGES/DRESSINGS) ×1 IMPLANT
DRAPE C-ARM FOLDED MOBILE STRL (DRAPES) ×3 IMPLANT
ELECT REM PT RETURN 9FT ADLT (ELECTROSURGICAL) ×3
ELECTRODE REM PT RTRN 9FT ADLT (ELECTROSURGICAL) ×1 IMPLANT
GLOVE BIOGEL PI IND STRL 7.0 (GLOVE) ×2 IMPLANT
GLOVE BIOGEL PI INDICATOR 7.0 (GLOVE) ×4
GLOVE ECLIPSE 6.5 STRL STRAW (GLOVE) ×2 IMPLANT
GLOVE SURG SS PI 7.5 STRL IVOR (GLOVE) ×3 IMPLANT
GOWN STRL REUS W/TWL LRG LVL3 (GOWN DISPOSABLE) ×6 IMPLANT
IV NS 500ML (IV SOLUTION) ×2
IV NS 500ML BAXH (IV SOLUTION) ×1 IMPLANT
KIT PORT POWER 8FR ISP MRI (Port) ×3 IMPLANT
KIT TURNOVER KIT A (KITS) ×3 IMPLANT
NDL HYPO 25X1 1.5 SAFETY (NEEDLE) ×1 IMPLANT
NEEDLE HYPO 25X1 1.5 SAFETY (NEEDLE) ×3 IMPLANT
PACK MINOR (CUSTOM PROCEDURE TRAY) ×3 IMPLANT
PAD ARMBOARD 7.5X6 YLW CONV (MISCELLANEOUS) ×3 IMPLANT
SET BASIN LINEN APH (SET/KITS/TRAYS/PACK) ×3 IMPLANT
SUT MNCRL AB 4-0 PS2 18 (SUTURE) ×3 IMPLANT
SUT VIC AB 3-0 SH 27 (SUTURE) ×2
SUT VIC AB 3-0 SH 27X BRD (SUTURE) ×1 IMPLANT
SYR 5ML LL (SYRINGE) ×3 IMPLANT
SYR CONTROL 10ML LL (SYRINGE) ×3 IMPLANT

## 2018-11-18 NOTE — Anesthesia Postprocedure Evaluation (Signed)
Anesthesia Post Note  Patient: Nicholas Sanchez  Procedure(s) Performed: INSERTION PORT-A-CATH (Right Chest)  Patient location during evaluation: PACU Anesthesia Type: MAC Level of consciousness: awake and alert and patient cooperative Pain management: satisfactory to patient Vital Signs Assessment: post-procedure vital signs reviewed and stable Respiratory status: spontaneous breathing Cardiovascular status: stable Postop Assessment: no apparent nausea or vomiting Anesthetic complications: no     Last Vitals:  Vitals:   11/18/18 0945 11/18/18 1055  BP: (!) 157/82 (!) 141/74  Pulse: 70 76  Resp: 18 18  Temp: 36.5 C 36.5 C  SpO2: 97% 99%    Last Pain:  Vitals:   11/18/18 1055  TempSrc:   PainSc: 0-No pain                 Melvin Whiteford

## 2018-11-18 NOTE — Discharge Instructions (Signed)
Implanted [facility] Guide An implanted port is a device that is placed under the skin. It is usually placed in the chest. The device can be used to give IV medicine, to take blood, or for dialysis. You may have an implanted port if:  You need IV medicine that would be irritating to the small veins in your hands or arms.  You need IV medicines, such as antibiotics, for a long period of time.  You need IV nutrition for a long period of time.  You need dialysis. Having a port means that your health care provider will not need to use the veins in your arms for these procedures. You may have fewer limitations when using a port than you would if you used other types of long-term IVs, and you will likely be able to return to normal activities after your incision heals. An implanted port has two main parts:  Reservoir. The reservoir is the part where a needle is inserted to give medicines or draw blood. The reservoir is round. After it is placed, it appears as a small, raised area under your skin.  Catheter. The catheter is a thin, flexible tube that connects the reservoir to a vein. Medicine that is inserted into the reservoir goes into the catheter and then into the vein. How is my port accessed? To access your port:  A numbing cream may be placed on the skin over the port site.  Your health care provider will put on a mask and sterile gloves.  The skin over your port will be cleaned carefully with a germ-killing soap and allowed to dry.  Your health care provider will gently pinch the port and insert a needle into it.  Your health care provider will check for a blood return to make sure the port is in the vein and is not clogged.  If your port needs to remain accessed to get medicine continuously (constant infusion), your health care provider will place a clear bandage (dressing) over the needle site. The dressing and needle will need to be changed every week, or as told by your health care  provider. What is flushing? Flushing helps keep the port from getting clogged. Follow instructions from your health care provider about how and when to flush the port. Ports are usually flushed with saline solution or a medicine called heparin. The need for flushing will depend on how the port is used:  If the port is only used from time to time to give medicines or draw blood, the port may need to be flushed: ? Before and after medicines have been given. ? Before and after blood has been drawn. ? As part of routine maintenance. Flushing may be recommended every 4-6 weeks.  If a constant infusion is running, the port may not need to be flushed.  Throw away any syringes in a disposal container that is meant for sharp items (sharps container). You can buy a sharps container from a pharmacy, or you can make one by using an empty hard plastic bottle with a cover. How long will my port stay implanted? The port can stay in for as long as your health care provider thinks it is needed. When it is time for the port to come out, a surgery will be done to remove it. The surgery will be similar to the procedure that was done to put the port in. Follow these instructions at home:   Flush your port as told by your health care provider.  If you need an infusion over several days, follow instructions from your health care provider about how to take care of your port site. Make sure you: ? Wash your hands with soap and water before you change your dressing. If soap and water are not available, use alcohol-based hand sanitizer. ? Change your dressing as told by your health care provider. ? Place any used dressings or infusion bags into a plastic bag. Throw that bag in the trash. ? Keep the dressing that covers the needle clean and dry. Do not get it wet. ? Do not use scissors or sharp objects near the tube. ? Keep the tube clamped, unless it is being used.  Check your port site every day for signs of  infection. Check for: ? Redness, swelling, or pain. ? Fluid or blood. ? Pus or a bad smell.  Protect the skin around the port site. ? Avoid wearing bra straps that rub or irritate the site. ? Protect the skin around your port from seat belts. Place a soft pad over your chest if needed.  Bathe or shower as told by your health care provider. The site may get wet as long as you are not actively receiving an infusion.  Return to your normal activities as told by your health care provider. Ask your health care provider what activities are safe for you.  Carry a medical alert card or wear a medical alert bracelet at all times. This will let health care providers know that you have an implanted port in case of an emergency. Get help right away if:  You have redness, swelling, or pain at the port site.  You have fluid or blood coming from your port site.  You have pus or a bad smell coming from the port site.  You have a fever. Summary  Implanted ports are usually placed in the chest for long-term IV access.  Follow instructions from your health care provider about flushing the port and changing bandages (dressings).  Take care of the area around your port by avoiding clothing that puts pressure on the area, and by watching for signs of infection.  Protect the skin around your port from seat belts. Place a soft pad over your chest if needed.  Get help right away if you have a fever or you have redness, swelling, pain, drainage, or a bad smell at the port site. This information is not intended to replace advice given to you by your health care provider. Make sure you discuss any questions you have with your health care provider. Document Released: 02/16/2005 Document Revised: 06/10/2018 Document Reviewed: 03/21/2016 Elsevier Patient Education  2020 [place] After These instructions provide you with information about caring for yourself after your  procedure. Your health care provider may also give you more specific instructions. Your treatment has been planned according to current medical practices, but problems sometimes occur. Call your health care provider if you have any problems or questions after your procedure. What can I expect after the procedure? After your procedure, you may:  Feel sleepy for several hours.  Feel clumsy and have poor balance for several hours.  Feel forgetful about what happened after the procedure.  Have poor judgment for several hours.  Feel nauseous or vomit.  Have a sore throat if you had a breathing tube during the procedure. Follow these instructions at home: For at least 24 hours after the procedure:      Have a  responsible adult stay with you. It is important to have someone help care for you until you are awake and alert.  Rest as needed.  Do not: ? Participate in activities in which you could fall or become injured. ? Drive. ? Use heavy machinery. ? Drink alcohol. ? Take sleeping pills or medicines that cause drowsiness. ? Make important decisions or sign legal documents. ? Take care of children on your own. Eating and drinking  Follow the diet that is recommended by your health care provider.  If you vomit, drink water, juice, or soup when you can drink without vomiting.  Make sure you have little or no nausea before eating solid foods. General instructions  Take over-the-counter and prescription medicines only as told by your health care provider.  If you have sleep apnea, surgery and certain medicines can increase your risk for breathing problems. Follow instructions from your health care provider about wearing your sleep device: ? Anytime you are sleeping, including during daytime naps. ? While taking prescription pain medicines, sleeping medicines, or medicines that make you drowsy.  If you smoke, do not smoke without supervision.  Keep all follow-up visits as told by  your health care provider. This is important. Contact a health care provider if:  You keep feeling nauseous or you keep vomiting.  You feel light-headed.  You develop a rash.  You have a fever. Get help right away if:  You have trouble breathing. Summary  For several hours after your procedure, you may feel sleepy and have poor judgment.  Have a responsible adult stay with you for at least 24 hours or until you are awake and alert. This information is not intended to replace advice given to you by your health care provider. Make sure you discuss any questions you have with your health care provider. Document Released: 06/09/2015 Document Revised: 05/17/2017 Document Reviewed: 06/09/2015 Elsevier Patient Education  2020 [employer].

## 2018-11-18 NOTE — Transfer of Care (Signed)
Immediate Anesthesia Transfer of Care Note  Patient: Nicholas Sanchez  Procedure(s) Performed: INSERTION PORT-A-CATH (Right Chest)  Patient Location: PACU  Anesthesia Type:MAC  Level of Consciousness: awake, alert  and patient cooperative  Airway & Oxygen Therapy: Patient Spontanous Breathing  Post-op Assessment: Report given to RN and Post -op Vital signs reviewed and stable  Post vital signs: Reviewed and stable  Last Vitals:  Vitals Value Taken Time  BP 141/74 11/18/18 1051  Temp    Pulse 70 11/18/18 1052  Resp 20 11/18/18 1052  SpO2 96 % 11/18/18 1052  Vitals shown include unvalidated device data.  Last Pain:  Vitals:   11/18/18 0945  TempSrc:   PainSc: 0-No pain         Complications: No apparent anesthesia complications

## 2018-11-18 NOTE — Anesthesia Preprocedure Evaluation (Signed)
Anesthesia Evaluation  Patient identified by MRN, date of birth, ID band Patient awake    Reviewed: Allergy & Precautions, NPO status , Patient's Chart, lab work & pertinent test results, reviewed documented beta blocker date and time   Airway Mallampati: III  TM Distance: >3 FB Neck ROM: Full    Dental  (+) Poor Dentition, Chipped, Dental Advisory Given   Pulmonary former smoker,           Cardiovascular Exercise Tolerance: Good hypertension, Pt. on medications and Pt. on home beta blockers + angina + CAD and + Past MI  + dysrhythmias (complete heart block) + pacemaker      Neuro/Psych    GI/Hepatic GERD  ,  Endo/Other    Renal/GU Renal disease     Musculoskeletal   Abdominal   Peds  Hematology  (+) anemia ,   Anesthesia Other Findings   Reproductive/Obstetrics                             Anesthesia Physical Anesthesia Plan  ASA: IV  Anesthesia Plan: MAC   Post-op Pain Management:    Induction: Intravenous  PONV Risk Score and Plan:   Airway Management Planned: Natural Airway, Nasal Cannula and Simple Face Mask  Additional Equipment:   Intra-op Plan:   Post-operative Plan:   Informed Consent: I have reviewed the patients History and Physical, chart, labs and discussed the procedure including the risks, benefits and alternatives for the proposed anesthesia with the patient or authorized representative who has indicated his/her understanding and acceptance.     Dental advisory given  Plan Discussed with: CRNA  Anesthesia Plan Comments:         Anesthesia Quick Evaluation

## 2018-11-18 NOTE — Progress Notes (Signed)
Subjective: He was admitted with hemoptysis.  He has lung cancer and has been coughing up blood for a couple of months.  His hemoptysis was increased.  He has been on dual antiplatelet therapy and was on Eliquis.  He has had paroxysmal atrial fib and he has had coronary disease but I am going to ask cardiology what he needs as far as anticoagulation is concerned now.  He is not coughing up blood today he is scheduled for Port-A-Cath today  Objective: Vital signs in last 24 hours: Temp:  [97.9 F (36.6 C)-98.3 F (36.8 C)] 98.3 F (36.8 C) (09/18 0516) Pulse Rate:  [69-71] 69 (09/18 0516) Resp:  [18] 18 (09/18 0516) BP: (125-137)/(61-68) 133/68 (09/18 0516) SpO2:  [94 %-99 %] 98 % (09/18 0516) Weight change:  Last BM Date: 11/17/18  Intake/Output from previous day: 09/17 0701 - 09/18 0700 In: 240 [P.O.:240] Out: 100 [Urine:100]  PHYSICAL EXAM General appearance: alert, cooperative and no distress Resp: rhonchi bilaterally Cardio: regular rate and rhythm, S1, S2 normal, no murmur, click, rub or gallop GI: soft, non-tender; bowel sounds normal; no masses,  no organomegaly Extremities: extremities normal, atraumatic, no cyanosis or edema  Lab Results:  Results for orders placed or performed during the hospital encounter of 11/16/18 (from the past 48 hour(s))  Comprehensive metabolic panel     Status: Abnormal   Collection Time: 11/16/18  9:39 AM  Result Value Ref Range   Sodium 125 (L) 135 - 145 mmol/L   Potassium 3.7 3.5 - 5.1 mmol/L   Chloride 90 (L) 98 - 111 mmol/L   CO2 26 22 - 32 mmol/L   Glucose, Bld 100 (H) 70 - 99 mg/dL   BUN 12 8 - 23 mg/dL   Creatinine, Ser 0.98 0.61 - 1.24 mg/dL   Calcium 9.2 8.9 - 10.3 mg/dL   Total Protein 8.2 (H) 6.5 - 8.1 g/dL   Albumin 4.2 3.5 - 5.0 g/dL   AST 19 15 - 41 U/L   ALT 14 0 - 44 U/L   Alkaline Phosphatase 61 38 - 126 U/L   Total Bilirubin 1.8 (H) 0.3 - 1.2 mg/dL   GFR calc non Af Amer >60 >60 mL/min   GFR calc Af Amer >60 >60  mL/min   Anion gap 9 5 - 15    Comment: Performed at [facility], [address] [place], [place] [address]  Troponin I (High Sensitivity)     Status: Abnormal   Collection Time: 11/16/18  9:39 AM  Result Value Ref Range   Troponin I (High Sensitivity) 35 (H) <18 ng/L    Comment: (NOTE) Elevated high sensitivity troponin I (hsTnI) values and significant  changes across serial measurements may suggest ACS but many other  chronic and acute conditions are known to elevate hsTnI results.  Refer to the "Links" section for chest pain algorithms and additional  guidance. Performed at [facility], [address] [place], [place] [address]   CBC with Differential     Status: Abnormal   Collection Time: 11/16/18  9:39 AM  Result Value Ref Range   WBC 7.8 4.0 - 10.5 K/uL   RBC 3.73 (L) 4.22 - 5.81 MIL/uL   Hemoglobin 11.6 (L) 13.0 - 17.0 g/dL   HCT 35.5 (L) 39.0 - 52.0 %   MCV 95.2 80.0 - 100.0 fL   MCH 31.1 26.0 - 34.0 pg   MCHC 32.7 30.0 - 36.0 g/dL   RDW 13.9 11.5 - 15.5 %   Platelets  238 150 - 400 K/uL   nRBC 0.0 0.0 - 0.2 %   Neutrophils Relative % 72 %   Neutro Abs 5.6 1.7 - 7.7 K/uL   Lymphocytes Relative 9 %   Lymphs Abs 0.7 0.7 - 4.0 K/uL   Monocytes Relative 7 %   Monocytes Absolute 0.5 0.1 - 1.0 K/uL   Eosinophils Relative 11 %   Eosinophils Absolute 0.9 (H) 0.0 - 0.5 K/uL   Basophils Relative 1 %   Basophils Absolute 0.1 0.0 - 0.1 K/uL   Immature Granulocytes 0 %   Abs Immature Granulocytes 0.02 0.00 - 0.07 K/uL    Comment: Performed at [facility], [address] [place], [place] [address]  Protime-INR     Status: Abnormal   Collection Time: 11/16/18  9:39 AM  Result Value Ref Range   Prothrombin Time 21.6 (H) 11.4 - 15.2 seconds   INR 1.9 (H) 0.8 - 1.2    Comment: (NOTE) INR goal varies based on device and disease states. Performed at [facility], [address], [place], [place] [address]   SARS Coronavirus 2 [facility] order, Performed in [facility] hospital  lab) Nasopharyngeal Nasopharyngeal Swab     Status: None   Collection Time: 11/16/18  9:40 AM   Specimen: Nasopharyngeal Swab  Result Value Ref Range   SARS Coronavirus 2 NEGATIVE NEGATIVE    Comment: (NOTE) If result is NEGATIVE SARS-CoV-2 target nucleic acids are NOT DETECTED. The SARS-CoV-2 RNA is generally detectable in upper and lower  respiratory specimens during the acute phase of infection. The lowest  concentration of SARS-CoV-2 viral copies this assay can detect is 250  copies / mL. A negative result does not preclude SARS-CoV-2 infection  and should not be used as the sole basis for treatment or other  patient management decisions.  A negative result may occur with  improper specimen collection / handling, submission of specimen other  than nasopharyngeal swab, presence of viral mutation(s) within the  areas targeted by this assay, and inadequate number of viral copies  (<250 copies / mL). A negative result must be combined with clinical  observations, patient history, and epidemiological information. If result is POSITIVE SARS-CoV-2 target nucleic acids are DETECTED. The SARS-CoV-2 RNA is generally detectable in upper and lower  respiratory specimens dur ing the acute phase of infection.  Positive  results are indicative of active infection with SARS-CoV-2.  Clinical  correlation with patient history and other diagnostic information is  necessary to determine patient infection status.  Positive results do  not rule out bacterial infection or co-infection with other viruses. If result is PRESUMPTIVE POSTIVE SARS-CoV-2 nucleic acids MAY BE PRESENT.   A presumptive positive result was obtained on the submitted specimen  and confirmed on repeat testing.  While 2019 novel coronavirus  (SARS-CoV-2) nucleic acids may be present in the submitted sample  additional confirmatory testing may be necessary for epidemiological  and / or clinical management purposes  to differentiate  between  SARS-CoV-2 and other Sarbecovirus currently known to infect humans.  If clinically indicated additional testing with an alternate test  methodology [phone]) is advised. The SARS-CoV-2 RNA is generally  detectable in upper and lower respiratory sp ecimens during the acute  phase of infection. The expected result is Negative. Fact Sheet for Patients:  StrictlyIdeas.no Fact Sheet for Healthcare Providers: BankingDealers.co.za This test is not yet approved or cleared by the [place] FDA and has been authorized for detection and/or diagnosis of SARS-CoV-2 by FDA  under an Emergency Use Authorization (EUA).  This EUA will remain in effect (meaning this test can be used) for the duration of the COVID-19 declaration under Section 564(b)(1) of the Act, 21 U.S.C. section 360bbb-3(b)(1), unless the authorization is terminated or revoked sooner. Performed at [facility], [address], [place], [place] [address]   Type and screen [facility]     Status: None   Collection Time: 11/16/18 10:18 AM  Result Value Ref Range   ABO/RH(D)      A POS Performed at [facility], [address] [place], [place] [address]    Antibody Screen      POS Performed at [facility], [address], [place], [place] [address]    Sample Expiration      11/19/2018,2359 Performed at [facility], [address] [place], [place] [address]    Antibody Identification WARM AUTOANTIBODY    DAT, IgG POS    Antibody ID,T [place]      WARM AUTOANTIBODY Performed at [facility] [address] [place], [place] [address]   Troponin I (High Sensitivity)     Status: Abnormal   Collection Time: 11/16/18 12:05 PM  Result Value Ref Range   Troponin I (High Sensitivity) 30 (H) <18 ng/L    Comment: (NOTE) Elevated high sensitivity troponin I (hsTnI) values and significant  changes across serial measurements may suggest ACS but many  other  chronic and acute conditions are known to elevate hsTnI results.  Refer to the "Links" section for chest pain algorithms and additional  guidance. Performed at [facility], [address] [place], [place] [address]   Basic metabolic panel     Status: Abnormal   Collection Time: 11/17/18  4:57 AM  Result Value Ref Range   Sodium 126 (L) 135 - 145 mmol/L   Potassium 4.0 3.5 - 5.1 mmol/L   Chloride 91 (L) 98 - 111 mmol/L   CO2 28 22 - 32 mmol/L   Glucose, Bld 83 70 - 99 mg/dL   BUN 12 8 - 23 mg/dL   Creatinine, Ser 0.96 0.61 - 1.24 mg/dL   Calcium 8.7 (L) 8.9 - 10.3 mg/dL   GFR calc non Af Amer >60 >60 mL/min   GFR calc Af Amer >60 >60 mL/min   Anion gap 7 5 - 15    Comment: Performed at [facility], [address], [place], [place] [address]  Magnesium     Status: None   Collection Time: 11/17/18  4:57 AM  Result Value Ref Range   Magnesium 1.8 1.7 - 2.4 mg/dL    Comment: Performed at [facility], [address] [place], [place] [address]  CBC     Status: Abnormal   Collection Time: 11/17/18  4:57 AM  Result Value Ref Range   WBC 6.4 4.0 - 10.5 K/uL   RBC 3.17 (L) 4.22 - 5.81 MIL/uL   Hemoglobin 10.0 (L) 13.0 - 17.0 g/dL   HCT 30.6 (L) 39.0 - 52.0 %   MCV 96.5 80.0 - 100.0 fL   MCH 31.5 26.0 - 34.0 pg   MCHC 32.7 30.0 - 36.0 g/dL   RDW 14.1 11.5 - 15.5 %   Platelets 210 150 - 400 K/uL   nRBC 0.0 0.0 - 0.2 %    Comment: Performed at [facility], [address] [place], [place] [address]  CBC with Differential/Platelet     Status: Abnormal   Collection Time: 11/18/18  6:36 AM  Result Value  Ref Range   WBC 5.4 4.0 - 10.5 K/uL   RBC 3.08 (L) 4.22 - 5.81 MIL/uL   Hemoglobin 9.7 (L) 13.0 - 17.0 g/dL   HCT 29.4 (L) 39.0 - 52.0 %   MCV 95.5 80.0 - 100.0 fL   MCH 31.5 26.0 - 34.0 pg   MCHC 33.0 30.0 - 36.0 g/dL   RDW 14.0 11.5 - 15.5 %   Platelets 195 150 - 400 K/uL   nRBC 0.0 0.0 - 0.2 %   Neutrophils Relative % 68 %   Neutro Abs 3.6 1.7 - 7.7 K/uL   Lymphocytes  Relative 12 %   Lymphs Abs 0.7 0.7 - 4.0 K/uL   Monocytes Relative 9 %   Monocytes Absolute 0.5 0.1 - 1.0 K/uL   Eosinophils Relative 10 %   Eosinophils Absolute 0.5 0.0 - 0.5 K/uL   Basophils Relative 1 %   Basophils Absolute 0.0 0.0 - 0.1 K/uL   Immature Granulocytes 0 %   Abs Immature Granulocytes 0.02 0.00 - 0.07 K/uL    Comment: Performed at [facility], [address] [place],  [address]  Basic metabolic panel     Status: Abnormal   Collection Time: 11/18/18  6:36 AM  Result Value Ref Range   Sodium 125 (L) 135 - 145 mmol/L   Potassium 4.0 3.5 - 5.1 mmol/L   Chloride 94 (L) 98 - 111 mmol/L   CO2 25 22 - 32 mmol/L   Glucose, Bld 87 70 - 99 mg/dL   BUN 13 8 - 23 mg/dL   Creatinine, Ser 0.94 0.61 - 1.24 mg/dL   Calcium 8.4 (L) 8.9 - 10.3 mg/dL   GFR calc non Af Amer >60 >60 mL/min   GFR calc Af Amer >60 >60 mL/min   Anion gap 6 5 - 15    Comment: Performed at [facility], [address], [place], [place] [address]    ABGS No results for input(s): PHART, PO2ART, TCO2, HCO3 in the last 72 hours.  Invalid input(s): PCO2 CULTURES Recent Results (from the past 240 hour(s))  SARS Coronavirus 2 [facility] order, Performed in [facility] hospital lab) Nasopharyngeal Nasopharyngeal Swab     Status: None   Collection Time: 11/16/18  9:40 AM   Specimen: Nasopharyngeal Swab  Result Value Ref Range Status   SARS Coronavirus 2 NEGATIVE NEGATIVE Final    Comment: (NOTE) If result is NEGATIVE SARS-CoV-2 target nucleic acids are NOT DETECTED. The SARS-CoV-2 RNA is generally detectable in upper and lower  respiratory specimens during the acute phase of infection. The lowest  concentration of SARS-CoV-2 viral copies this assay can detect is 250  copies / mL. A negative result does not preclude SARS-CoV-2 infection  and should not be used as the sole basis for treatment or other  patient management decisions.  A negative result may occur with  improper specimen collection /  handling, submission of specimen other  than nasopharyngeal swab, presence of viral mutation(s) within the  areas targeted by this assay, and inadequate number of viral copies  (<250 copies / mL). A negative result must be combined with clinical  observations, patient history, and epidemiological information. If result is POSITIVE SARS-CoV-2 target nucleic acids are DETECTED. The SARS-CoV-2 RNA is generally detectable in upper and lower  respiratory specimens dur ing the acute phase of infection.  Positive  results are indicative of active infection with SARS-CoV-2.  Clinical  correlation with patient history and other diagnostic information is  necessary to determine  patient infection status.  Positive results do  not rule out bacterial infection or co-infection with other viruses. If result is PRESUMPTIVE POSTIVE SARS-CoV-2 nucleic acids MAY BE PRESENT.   A presumptive positive result was obtained on the submitted specimen  and confirmed on repeat testing.  While 2019 novel coronavirus  (SARS-CoV-2) nucleic acids may be present in the submitted sample  additional confirmatory testing may be necessary for epidemiological  and / or clinical management purposes  to differentiate between  SARS-CoV-2 and other Sarbecovirus currently known to infect humans.  If clinically indicated additional testing with an alternate test  methodology [phone]) is advised. The SARS-CoV-2 RNA is generally  detectable in upper and lower respiratory sp ecimens during the acute  phase of infection. The expected result is Negative. Fact Sheet for Patients:  StrictlyIdeas.no Fact Sheet for Healthcare Providers: BankingDealers.co.za This test is not yet approved or cleared by the [place] FDA and has been authorized for detection and/or diagnosis of SARS-CoV-2 by FDA under an Emergency Use Authorization (EUA).  This EUA will remain in effect (meaning this  test can be used) for the duration of the COVID-19 declaration under Section 564(b)(1) of the Act, 21 U.S.C. section 360bbb-3(b)(1), unless the authorization is terminated or revoked sooner. Performed at [facility], [address] [place], [place] [address]    Studies/Results: Dg Chest 2 View  Result Date: 11/16/2018 CLINICAL DATA:  85 year old male with hemoptysis. EXAM: CHEST - 2 VIEW COMPARISON:  Chest radiograph dated 10/31/2018 FINDINGS: There is diffuse chronic interstitial coarsening and mild bronchitic changes. Bibasilar interstitial coarsening/scarring or atelectasis. An area of increased density over the right minor fissure has improved since the prior radiograph. No new consolidation. No large pleural effusion or pneumothorax. Stable cardiomegaly. Atherosclerotic calcification of the aorta. Left pectoral pacemaker device. No acute osseous pathology. IMPRESSION: Chronic changes.  No new consolidation. Electronically Signed   By: Anner Crete M.D.   On: 11/16/2018 10:23   Ct Angio Chest Pe W And/or Wo Contrast  Result Date: 11/16/2018 CLINICAL DATA:  PE suspected high pretest probability. Status post lung biopsy. Hemoptysis. EXAM: CT ANGIOGRAPHY CHEST WITH CONTRAST TECHNIQUE: Multidetector CT imaging of the chest was performed using the standard protocol during bolus administration of intravenous contrast. Multiplanar CT image reconstructions and MIPs were obtained to evaluate the vascular anatomy. CONTRAST:  131mL OMNIPAQUE IOHEXOL 350 MG/ML SOLN COMPARISON:  CT dated September 27, 2018. FINDINGS: Cardiovascular: Evaluation is limited by motion artifact.There is no pulmonary embolus. The main pulmonary artery is dilated measuring approximately 3.6 cm in diameter the heart size is enlarged. Aortic calcifications and coronary artery calcifications are noted. There may be coronary artery stents in place. There is a left-sided pacemaker in place. Mediastinum/Nodes: --pathologically enlarged  mediastinal and hilar lymph nodes are again noted. --No axillary lymphadenopathy. --No supraclavicular lymphadenopathy. --Normal thyroid gland. --The esophagus is unremarkable Lungs/Pleura: Again noted are emphysematous changes bilaterally with chronic areas of scarring and atelectasis with potential for underlying pulmonary fibrosis. There is a small left-sided pleural effusion which is new from prior study. Again noted is a left lower lobe lung mass measuring approximately 2.8 by 3 cm. There is no pneumothorax. There is some debris within the dependent portion of the trachea. Upper Abdomen: No acute abnormality. The spleen is likely borderline enlarged but is only partially visualized on this exam. Musculoskeletal: No chest wall abnormality. No acute or significant osseous findings. Review of the MIP images confirms the above findings. IMPRESSION: 1. Study is limited by motion  artifact. 2. No evidence for pulmonary embolus. 3. New small left-sided pleural effusion.  No pneumothorax. 4. Again noted is a left lower lobe 3 cm lung mass with evidence for nodal metastatic disease to the mediastinum. Aortic Atherosclerosis (ICD10-I70.0) and Emphysema (ICD10-J43.9). Electronically Signed   By: Constance Holster M.D.   On: 11/16/2018 12:28    Medications:  Prior to Admission:  Medications Prior to Admission  Medication Sig Dispense Refill Last Dose  . acetaminophen (TYLENOL) 500 MG tablet Take 500 mg by mouth every 6 (six) hours as needed for pain.   11/15/2018 at Unknown time  . aspirin EC 81 MG tablet Take 81 mg by mouth daily.   11/16/2018 at Unknown time  . cholecalciferol (VITAMIN D) 1000 units tablet Take 1,000 Units by mouth 2 (two) times daily.   11/15/2018 at Unknown time  . clopidogrel (PLAVIX) 75 MG tablet TAKE ONE (1) TABLET BY MOUTH EVERY DAY 90 tablet 3 11/15/2018 at 2230  . dorzolamide-timolol (COSOPT) 22.3-6.8 MG/ML ophthalmic solution Place 1 drop into the left eye 2 (two) times daily.   11/16/2018  at Unknown time  . ferrous sulfate 325 (65 FE) MG tablet Take 325 mg by mouth 3 (three) times daily.   11/15/2018 at Unknown time  . hydrochlorothiazide (HYDRODIURIL) 25 MG tablet TAKE ONE (1) TABLET BY MOUTH EVERY DAY (Patient taking differently: Take 25 mg by mouth daily. ) 90 tablet 3 11/16/2018 at Unknown time  . losartan (COZAAR) 25 MG tablet TAKE ONE TABLET BY MOUTY EVERY DAY (Patient taking differently: Take 25 mg by mouth daily. ) 90 tablet 1 11/15/2018 at Unknown time  . lovastatin (MEVACOR) 10 MG tablet TAKE ONE (1) TABLET BY MOUTH EVERY DAY 90 tablet 3 11/15/2018 at Unknown time  . metoprolol succinate (TOPROL XL) 25 MG 24 hr tablet Take 1 tablet (25 mg total) by mouth daily. 90 tablet 3 11/16/2018 at 0730  . pantoprazole (PROTONIX) 40 MG tablet Take 40 mg by mouth daily.   11/15/2018 at Unknown time  . Propylene Glycol (SYSTANE BALANCE OP) Place 1 drop into both eyes 2 (two) times daily as needed (dry eyes).    11/15/2018 at Unknown time  . rivaroxaban (XARELTO) 20 MG TABS tablet Take 1 tablet (20 mg total) by mouth daily with supper. Dx: paroxysmal atrial fibrillation 90 tablet 3 11/15/2018 at 2230  . tamsulosin (FLOMAX) 0.4 MG CAPS capsule Take 0.4 mg by mouth daily.   11/15/2018 at Unknown time  . triamcinolone cream (KENALOG) 0.1 % Apply 1 application topically daily as needed (itching).    Past Week at Unknown time  . vitamin B-12 (CYANOCOBALAMIN) 1000 MCG tablet Take 1,000 mcg by mouth daily.   11/15/2018 at Unknown time  . [START ON 11/21/2018] CARBOPLATIN IV Inject into the vein once a week.     . lidocaine-prilocaine (EMLA) cream Apply a dime-sized amount to port a cath site and cover with plastic wrap one hour prior to chemotherapy appointments 30 g 0   . [START ON 11/21/2018] PACLITAXEL IV Inject into the vein once a week.     . prochlorperazine (COMPAZINE) 10 MG tablet Take 1 tablet (10 mg total) by mouth every 6 (six) hours as needed for nausea or vomiting. 30 tablet 1    Scheduled: .  Chlorhexidine Gluconate Cloth  6 each Topical Once  . cholecalciferol  1,000 Units Oral BID  . dorzolamide-timolol  1 drop Left Eye BID  . ferrous sulfate  325 mg Oral Q breakfast  .  losartan  25 mg Oral Daily  . metoprolol succinate  25 mg Oral Daily  . pantoprazole  40 mg Oral Daily  . pravastatin  10 mg Oral q1800  . tamsulosin  0.4 mg Oral QHS   Continuous: . sodium chloride 50 mL/hr at 11/16/18 1516  .  ceFAZolin (ANCEF) IV     [id number] **OR** acetaminophen, ondansetron **OR** ondansetron (ZOFRAN) IV, oxyCODONE, polyvinyl alcohol, senna-docusate, traZODone  Assesment: He was admitted with hemoptysis.  He has acute blood loss anemia but does not need a blood transfusion.  He has iron deficiency.  He has coronary artery occlusive disease but is not having any chest pain.  He has paroxysmal atrial fib and he was on Xarelto.  He has hypertension which seems to be well-controlled  He has lung cancer and his hemoptysis is coming from that. Principal Problem:   Hemoptysis Active Problems:   HTN (hypertension)   Hypercholesteremia   GERD (gastroesophageal reflux disease)   CAD (coronary artery disease)   Paroxysmal atrial fibrillation (HCC)   IDA (iron deficiency anemia)   Dysphagia   Angina at rest [facility])   Squamous cell carcinoma of lung, left (HCC)   Anticoagulant long-term use   Hyponatremia    Plan: Port-A-Cath today.  Potential discharge afterward but is being done in the afternoon.  Discuss with cardiology regarding long-term anticoagulation    LOS: 0 days   Alonza Bogus 11/18/2018, 8:01 AM

## 2018-11-18 NOTE — TOC Initial Note (Addendum)
Transition of Care [facility]) - Initial/Assessment Note    Patient Details  Name: Nicholas Sanchez MRN: [id number] Date of Birth: January 07, 1934  Transition of Care [facility]) CM/SW Contact:    Trish Mage, LCSW Phone Number: 11/18/2018, 9:32 AM  Clinical Narrative:   Admitted with hemoptysis.  He has lung cancer and has been coughing up blood for a couple of months.  Also paroxysmal atrial fib and he has had coronary disease  Scheduled for Port-A-Cath today.  Lives at home with wife and has a daughter in the area as well.  He uses a cane, and also has access to a walker, but did not identify any other DME in the home.  Recommended for [facility] PT.  Pt states he is not interested in services currently, "but I will call later if I need any help."  I suggested he at least have someone come out to evaluate after he gets home.  Stated he would talk to Dr Luan Pulling about this to see what he thinks. Dr Luan Pulling alerted.            Expected Discharge Plan: [place] Barriers to Discharge: No Barriers Identified   Patient Goals and CMS Choice Patient states their goals for this hospitalization and ongoing recovery are:: "We're doing alright, considering everything."      Expected Discharge Plan and Services Expected Discharge Plan: [place]   Discharge Planning Services: CM Consult   Living arrangements for the past 2 months: Single Family Home                                      Prior Living Arrangements/Services Living arrangements for the past 2 months: Single Family Home Lives with:: Spouse Patient language and need for interpreter reviewed:: Yes Do you feel safe going back to the place where you live?: Yes      Need for Family Participation in Patient Care: Yes (Comment) Care giver support system in place?: Yes (comment) Current home services: DME Criminal Activity/Legal Involvement Pertinent to Current Situation/Hospitalization: No - Comment as  needed  Activities of Daily Living Home Assistive Devices/Equipment: Eyeglasses, Environmental consultant (specify type), Cane (specify quad or straight) ADL Screening (condition at time of admission) Patient's cognitive ability adequate to safely complete daily activities?: Yes Is the patient deaf or have difficulty hearing?: No Does the patient have difficulty seeing, even when wearing glasses/contacts?: No Does the patient have difficulty concentrating, remembering, or making decisions?: No Patient able to express need for assistance with ADLs?: Yes Does the patient have difficulty dressing or bathing?: Yes Independently performs ADLs?: Yes (appropriate for developmental age) Does the patient have difficulty walking or climbing stairs?: Yes Weakness of Legs: None Weakness of Arms/Hands: None  Permission Sought/Granted                  Emotional Assessment Appearance:: Appears stated age Attitude/Demeanor/Rapport: Engaged Affect (typically observed): Appropriate Orientation: : Oriented to Self, Oriented to Place, Oriented to  Time, Oriented to Situation Alcohol / Substance Use: Not Applicable Psych Involvement: No (comment)  Admission diagnosis:  Hemoptysis [R04.2] Patient Active Problem List   Diagnosis Date Noted  . Hemoptysis 11/16/2018  . Anticoagulant long-term use 11/16/2018  . Hyponatremia 11/16/2018  . Goals of care, counseling/discussion 11/16/2018  . Squamous cell carcinoma of lung, left ([place]) 11/09/2018  . Mass of lower lobe of left lung 10/26/2018  .  Hx of adenomatous colonic polyps 11/09/2017  . Angina at rest [facility]) 10/20/2017  . NSTEMI (non-ST elevated myocardial infarction) ([place]) 10/20/2017  . IDA (iron deficiency anemia) 08/04/2017  . Dysphagia 08/04/2017  . Paroxysmal atrial fibrillation ([place]) 01/21/2016  . Heart block 01/08/2016  . CAD (coronary artery disease)   . HTN (hypertension)   . Hypercholesteremia   . GERD (gastroesophageal reflux disease)    PCP:   Sinda Du, MD Pharmacy:   [place], [place] [address] Phone: [phone] Fax: [phone]  [place], [place] [address] Phone: [phone] Fax: [phone]     Social Determinants of Health (SDOH) Interventions    Readmission Risk Interventions No flowsheet data found.

## 2018-11-18 NOTE — Anesthesia Procedure Notes (Signed)
Procedure Name: MAC Date/Time: 11/18/2018 10:20 AM Performed by: Vista Deck, CRNA Pre-anesthesia Checklist: Patient identified, Emergency Drugs available, Suction available, Timeout performed and Patient being monitored Patient Re-evaluated:Patient Re-evaluated prior to induction Oxygen Delivery Method: Nasal Cannula

## 2018-11-18 NOTE — Plan of Care (Signed)

## 2018-11-18 NOTE — Interval H&P Note (Signed)
History and Physical Interval Note:  11/18/2018 9:00 AM  Nicholas Sanchez  has presented today for surgery, with the diagnosis of left lung cancer.  The various methods of treatment have been discussed with the patient and family. After consideration of risks, benefits and other options for treatment, the patient has consented to  Procedure(s): INSERTION PORT-A-CATH (Right) as a surgical intervention.  The patient's history has been reviewed, patient examined, no change in status, stable for surgery.  I have reviewed the patient's chart and labs.  Questions were answered to the patient's satisfaction.     [name]

## 2018-11-18 NOTE — Op Note (Signed)
Patient:  Nicholas Sanchez  DOB:  1933-05-22  MRN:  [id number]   Preop Diagnosis: Left lung carcinoma, need for central venous access  Postop Diagnosis: Same  Procedure: Port-A-Cath insertion  Surgeon: [name], MD  Anes: MAC  Indications: Patient is an 85 year old white male who has left lung carcinoma and is about to undergo chemotherapy.  The risks and benefits of the procedure including bleeding, infection, and pneumothorax were fully explained to the patient, who gave informed consent.  Procedure note: The patient was placed in the Trendelenburg position after monitored anesthesia care was given.  The right upper chest was prepped and draped using the usual sterile technique with ChloraPrep.  Surgical site confirmation was performed.  1% Xylocaine was used for local anesthesia.  An incision was made below the right clavicle.  A subcutaneous pocket was formed.  A needle was advanced into the right subclavian vein using the Seldinger technique without difficulty.  A guidewire was then advanced into the right atrium under fluoroscopic guidance.  An introducer and peel-away sheath were placed over the guidewire.  The catheter was inserted through the peel-away sheath and the peel-away sheath was removed.  The catheter was then attached to the port and the port placed in subcutaneous pocket.  Adequate positioning was confirmed by fluoroscopy.  Good backflow of venous blood was noted on aspiration of the port.  The port was flushed with heparin flush.  The subcutaneous layer was reapproximated using a 3-0 Vicryl interrupted suture.  The skin was closed using a 4-0 Monocryl subcuticular suture.  Dermabond was applied.  All tape and needle counts were correct at the end of the procedure.  The patient was transferred to PACU in stable condition.  A chest x-ray will be performed at that time.  Complications: None  EBL: Minimal  Specimen: None

## 2018-11-19 NOTE — Discharge Summary (Signed)
Physician Discharge Summary  Patient ID: EYOEL THROGMORTON MRN: [id number] DOB/AGE: 1933/07/26 85 y.o. Primary Care Physician:Arrianna Catala, Percell Miller, MD Admit date: 11/16/2018 Discharge date: 11/19/2018    Discharge Diagnoses:   Principal Problem:   Hemoptysis Active Problems:   HTN (hypertension)   Hypercholesteremia   GERD (gastroesophageal reflux disease)   CAD (coronary artery disease)   Paroxysmal atrial fibrillation (HCC)   IDA (iron deficiency anemia)   Dysphagia   Angina at rest [facility])   Squamous cell carcinoma of lung, left (HCC)   Anticoagulant long-term use   Hyponatremia Acute on chronic blood loss anemia  Allergies as of 11/18/2018      Reactions   Ace Inhibitors Nausea Only   Tape Rash   And Bandaids, too      Medication List    STOP taking these medications   aspirin EC 81 MG tablet   clopidogrel 75 MG tablet Commonly known as: PLAVIX     TAKE these medications   acetaminophen 500 MG tablet Commonly known as: TYLENOL Take 500 mg by mouth every 6 (six) hours as needed for pain.   CARBOPLATIN IV Inject into the vein once a week. Start taking on: November 21, 2018   cholecalciferol 1000 units tablet Commonly known as: VITAMIN D Take 1,000 Units by mouth 2 (two) times daily.   dorzolamide-timolol 22.3-6.8 MG/ML ophthalmic solution Commonly known as: COSOPT Place 1 drop into the left eye 2 (two) times daily.   ferrous sulfate 325 (65 FE) MG tablet Take 325 mg by mouth 3 (three) times daily.   hydrochlorothiazide 25 MG tablet Commonly known as: HYDRODIURIL TAKE ONE (1) TABLET BY MOUTH EVERY DAY What changed: See the new instructions.   lidocaine-prilocaine cream Commonly known as: EMLA Apply a dime-sized amount to port a cath site and cover with plastic wrap one hour prior to chemotherapy appointments   losartan 25 MG tablet Commonly known as: COZAAR TAKE ONE TABLET BY [place] What changed: See the new instructions.   lovastatin 10 MG  tablet Commonly known as: MEVACOR TAKE ONE (1) TABLET BY MOUTH EVERY DAY   metoprolol succinate 25 MG 24 hr tablet Commonly known as: Toprol XL Take 1 tablet (25 mg total) by mouth daily.   PACLITAXEL IV Inject into the vein once a week. Start taking on: November 21, 2018   pantoprazole 40 MG tablet Commonly known as: PROTONIX Take 40 mg by mouth daily.   prochlorperazine 10 MG tablet Commonly known as: COMPAZINE Take 1 tablet (10 mg total) by mouth every 6 (six) hours as needed for nausea or vomiting.   rivaroxaban 20 MG Tabs tablet Commonly known as: Xarelto Take 1 tablet (20 mg total) by mouth daily with supper. Dx: paroxysmal atrial fibrillation   SYSTANE BALANCE OP Place 1 drop into both eyes 2 (two) times daily as needed (dry eyes).   tamsulosin 0.4 MG Caps capsule Commonly known as: FLOMAX Take 0.4 mg by mouth daily.   triamcinolone cream 0.1 % Commonly known as: KENALOG Apply 1 application topically daily as needed (itching).   vitamin B-12 1000 MCG tablet Commonly known as: CYANOCOBALAMIN Take 1,000 mcg by mouth daily.       Discharged Condition: Improved    Consults: General surgery, Dr. Arnoldo Morale  Significant Diagnostic Studies: Dg Chest 2 View  Result Date: 11/16/2018 CLINICAL DATA:  85 year old male with hemoptysis. EXAM: CHEST - 2 VIEW COMPARISON:  Chest radiograph dated 10/31/2018 FINDINGS: There is diffuse chronic interstitial coarsening and mild bronchitic changes.  Bibasilar interstitial coarsening/scarring or atelectasis. An area of increased density over the right minor fissure has improved since the prior radiograph. No new consolidation. No large pleural effusion or pneumothorax. Stable cardiomegaly. Atherosclerotic calcification of the aorta. Left pectoral pacemaker device. No acute osseous pathology. IMPRESSION: Chronic changes.  No new consolidation. Electronically Signed   By: Anner Crete M.D.   On: 11/16/2018 10:23   Ct Head W Wo  Contrast  Result Date: 11/03/2018 CLINICAL DATA:  Lung mass.  Staging for cancer EXAM: CT HEAD WITHOUT AND WITH CONTRAST TECHNIQUE: Contiguous axial images were obtained from the base of the skull through the vertex without and with intravenous contrast CONTRAST:  60mL OMNIPAQUE IOHEXOL 300 MG/ML  SOLN COMPARISON:  None. FINDINGS: Brain: Mild to moderate atrophy. Symmetric white matter disease bilaterally appears chronic. Negative for acute infarct, hemorrhage, mass. Normal enhancement.  No metastatic deposits in the brain. Vascular: Negative for hyperdense vessel. Atherosclerotic calcification cavernous carotid bilaterally. Normal venous enhancement. Skull: Negative Sinuses/Orbits: Paranasal sinuses clear.  Bilateral ocular surgery. Other: None IMPRESSION: Negative for metastatic disease Atrophy and chronic microvascular ischemic change in the white matter. No acute abnormality. Electronically Signed   By: Franchot Gallo M.D.   On: 11/03/2018 08:46   Ct Angio Chest Pe W And/or Wo Contrast  Result Date: 11/16/2018 CLINICAL DATA:  PE suspected high pretest probability. Status post lung biopsy. Hemoptysis. EXAM: CT ANGIOGRAPHY CHEST WITH CONTRAST TECHNIQUE: Multidetector CT imaging of the chest was performed using the standard protocol during bolus administration of intravenous contrast. Multiplanar CT image reconstructions and MIPs were obtained to evaluate the vascular anatomy. CONTRAST:  135mL OMNIPAQUE IOHEXOL 350 MG/ML SOLN COMPARISON:  CT dated September 27, 2018. FINDINGS: Cardiovascular: Evaluation is limited by motion artifact.There is no pulmonary embolus. The main pulmonary artery is dilated measuring approximately 3.6 cm in diameter the heart size is enlarged. Aortic calcifications and coronary artery calcifications are noted. There may be coronary artery stents in place. There is a left-sided pacemaker in place. Mediastinum/Nodes: --pathologically enlarged mediastinal and hilar lymph nodes are again  noted. --No axillary lymphadenopathy. --No supraclavicular lymphadenopathy. --Normal thyroid gland. --The esophagus is unremarkable Lungs/Pleura: Again noted are emphysematous changes bilaterally with chronic areas of scarring and atelectasis with potential for underlying pulmonary fibrosis. There is a small left-sided pleural effusion which is new from prior study. Again noted is a left lower lobe lung mass measuring approximately 2.8 by 3 cm. There is no pneumothorax. There is some debris within the dependent portion of the trachea. Upper Abdomen: No acute abnormality. The spleen is likely borderline enlarged but is only partially visualized on this exam. Musculoskeletal: No chest wall abnormality. No acute or significant osseous findings. Review of the MIP images confirms the above findings. IMPRESSION: 1. Study is limited by motion artifact. 2. No evidence for pulmonary embolus. 3. New small left-sided pleural effusion.  No pneumothorax. 4. Again noted is a left lower lobe 3 cm lung mass with evidence for nodal metastatic disease to the mediastinum. Aortic Atherosclerosis (ICD10-I70.0) and Emphysema (ICD10-J43.9). Electronically Signed   By: Constance Holster M.D.   On: 11/16/2018 12:28   Ct Biopsy  Result Date: 10/31/2018 INDICATION: 85 year old with left lung mass and chest lymphadenopathy. Findings are concerning for lung cancer and patient needs a tissue diagnosis. EXAM: CT-GUIDED LEFT LUNG MASS BIOPSY MEDICATIONS: None. ANESTHESIA/SEDATION: Moderate (conscious) sedation was employed during this procedure. A total of Versed 1.0 mg and Fentanyl 37.5 mcg was administered intravenously. Moderate Sedation Time: 23 minutes. The patient's  level of consciousness and vital signs were monitored continuously by radiology nursing throughout the procedure under my direct supervision. FLUOROSCOPY TIME:  None COMPLICATIONS: None immediate. PROCEDURE: Informed written consent was obtained from the patient after a  thorough discussion of the procedural risks, benefits and alternatives. All questions were addressed. A timeout was performed prior to the initiation of the procedure. Patient was placed on his left side. CT images through the chest were obtained. The left side of the back was prepped with chlorhexidine and sterile field was created. Skin and soft tissues were anesthetized with 1% lidocaine. Using CT guidance, a 17 gauge coaxial needle was directed into the posterior left lower lobe lesion. Needle placement was confirmed within the lung lesion with CT imaging. Two core biopsies were obtained with an 18 gauge core device. Specimens placed in formalin. 17 gauge coaxial needle was removed using the Bio[employer]entry tract sealant. Follow up CT images were obtained. Bandage placed over the puncture site. FINDINGS: Scattered chronic lung disease with pleural-based lesion in the posterior left lower lobe that measures greater than 3 cm. The lesion was covered by a posterior rib, therefore, needle was directed around the posterior rib into the lesion using CT guidance. Needle position was confirmed within the lesion prior to biopsy. No significant bleeding or pneumothorax following the core biopsies. IMPRESSION: CT-guided core biopsies of the left lower lobe lung mass. Electronically Signed   By: Markus Daft M.D.   On: 10/31/2018 14:19   Dg Chest Port 1 View  Result Date: 11/18/2018 CLINICAL DATA:  Port-A-Cath placement. EXAM: PORTABLE CHEST 1 VIEW COMPARISON:  Radiographs 11/16/2018 and 10/31/2018.  CT 11/16/2018. FINDINGS: 1053 hours. New right subclavian Port-A-Cath tip projects to the mid SVC level. Left subclavian pacemaker leads appear unchanged. The heart size and mediastinal contours are stable. There is aortic atherosclerosis. Patchy perihilar pulmonary opacities superimposed on chronic lung disease are stable. There is no pleural effusion or pneumothorax. IMPRESSION: New Port-A-Cath tip at the mid SVC level. No  pneumothorax or other acute findings. Electronically Signed   By: Richardean Sale M.D.   On: 11/18/2018 11:16   Dg Chest Port 1 View  Result Date: 10/31/2018 CLINICAL DATA:  Lung biopsy. EXAM: PORTABLE CHEST 1 VIEW COMPARISON:  Radiographs of September 06, 2018.  CT scan of September 27, 2018. FINDINGS: Stable cardiomediastinal silhouette. Atherosclerosis of thoracic aorta is noted. Left-sided pacemaker is unchanged in position. No pneumothorax or significant pleural effusion is noted. Stable mild right upper lobe opacity is noted concerning for possible pneumonia, atelectasis or scarring stable densities are noted in the lung bases consistent with scarring. Bony thorax is unremarkable. IMPRESSION: No pneumothorax is noted status post left lung biopsy. Stable right upper lobe atelectasis, scarring or inflammation is noted. Stable bibasilar scarring is noted. Aortic Atherosclerosis (ICD10-I70.0). Electronically Signed   By: Marijo Conception M.D.   On: 10/31/2018 13:22   Dg C-arm 1-60 Min-no Report  Result Date: 11/18/2018 Fluoroscopy was utilized by the requesting physician.  No radiographic interpretation.    Lab Results: Basic Metabolic Panel: Recent Labs    11/17/18 0457 11/18/18 0636  NA 126* 125*  K 4.0 4.0  CL 91* 94*  CO2 28 25  GLUCOSE 83 87  BUN 12 13  CREATININE 0.96 0.94  CALCIUM 8.7* 8.4*  MG 1.8  --    Liver Function Tests: Recent Labs    11/16/18 0939  AST 19  ALT 14  ALKPHOS 61  BILITOT 1.8*  PROT 8.2*  ALBUMIN  4.2     CBC: Recent Labs    11/16/18 0939 11/17/18 0457 11/18/18 0636  WBC 7.8 6.4 5.4  NEUTROABS 5.6  --  3.6  HGB 11.6* 10.0* 9.7*  HCT 35.5* 30.6* 29.4*  MCV 95.2 96.5 95.5  PLT 238 210 195    Recent Results (from the past 240 hour(s))  SARS Coronavirus 2 [facility] order, Performed in [facility] hospital lab) Nasopharyngeal Nasopharyngeal Swab     Status: None   Collection Time: 11/16/18  9:40 AM   Specimen: Nasopharyngeal Swab  Result Value Ref  Range Status   SARS Coronavirus 2 NEGATIVE NEGATIVE Final    Comment: (NOTE) If result is NEGATIVE SARS-CoV-2 target nucleic acids are NOT DETECTED. The SARS-CoV-2 RNA is generally detectable in upper and lower  respiratory specimens during the acute phase of infection. The lowest  concentration of SARS-CoV-2 viral copies this assay can detect is 250  copies / mL. A negative result does not preclude SARS-CoV-2 infection  and should not be used as the sole basis for treatment or other  patient management decisions.  A negative result may occur with  improper specimen collection / handling, submission of specimen other  than nasopharyngeal swab, presence of viral mutation(s) within the  areas targeted by this assay, and inadequate number of viral copies  (<250 copies / mL). A negative result must be combined with clinical  observations, patient history, and epidemiological information. If result is POSITIVE SARS-CoV-2 target nucleic acids are DETECTED. The SARS-CoV-2 RNA is generally detectable in upper and lower  respiratory specimens dur ing the acute phase of infection.  Positive  results are indicative of active infection with SARS-CoV-2.  Clinical  correlation with patient history and other diagnostic information is  necessary to determine patient infection status.  Positive results do  not rule out bacterial infection or co-infection with other viruses. If result is PRESUMPTIVE POSTIVE SARS-CoV-2 nucleic acids MAY BE PRESENT.   A presumptive positive result was obtained on the submitted specimen  and confirmed on repeat testing.  While 2019 novel coronavirus  (SARS-CoV-2) nucleic acids may be present in the submitted sample  additional confirmatory testing may be necessary for epidemiological  and / or clinical management purposes  to differentiate between  SARS-CoV-2 and other Sarbecovirus currently known to infect humans.  If clinically indicated additional testing with an  alternate test  methodology [phone]) is advised. The SARS-CoV-2 RNA is generally  detectable in upper and lower respiratory sp ecimens during the acute  phase of infection. The expected result is Negative. Fact Sheet for Patients:  StrictlyIdeas.no Fact Sheet for Healthcare Providers: BankingDealers.co.za This test is not yet approved or cleared by the [place] FDA and has been authorized for detection and/or diagnosis of SARS-CoV-2 by FDA under an Emergency Use Authorization (EUA).  This EUA will remain in effect (meaning this test can be used) for the duration of the COVID-19 declaration under Section 564(b)(1) of the Act, 21 U.S.C. section 360bbb-3(b)(1), unless the authorization is terminated or revoked sooner. Performed at [facility], [address] [place], [place] [address]      Hospital Course: This is an 85 year old who came to the hospital because of hemoptysis.  He has been having hemoptysis for a month or so and has recently been diagnosed as having squamous cell cancer of his lung.  Plans are being made for him to receive radiation and chemotherapy.  His hemoptysis is complicated by the fact that he has been on dual antiplatelet  therapy and on Xarelto.  I discussed his anticoagulation with Dr. Bronson Ing of cardiology and he felt that he could come off of Plavix and aspirin would have some cardioprotection from Xarelto and would have stroke protection from [place] so that was done.  He is scheduled to start chemotherapy on the 21st but he does not have Port-A-Cath at the time of admission so Dr. Arnoldo Morale was consulted and placed a Port-A-Cath on 11/18/2018.  Patient felt well after Port-A-Cath was placed his medications for anticoagulation had been held and his hemoptysis had stopped.  He was discharged home in improved condition.  Discharge Exam: Blood pressure (!) 163/71, pulse 70, temperature 98.4 F (36.9 C), temperature  source Oral, resp. rate 18, height 6\' 1"  (1.854 m), weight 90.7 kg, SpO2 99 %. He is awake and alert.  Chest is clear.  Heart is regular.  Disposition: Home    Follow-up Information    [name], MD.   Specialty: General Surgery Why: As needed Contact information: [id number] [place] [address] [phone]           Signed: Alonza Bogus   11/19/2018, 8:59 AM

## 2018-11-21 ENCOUNTER — Other Ambulatory Visit: Payer: Self-pay

## 2018-11-21 ENCOUNTER — Inpatient Hospital Stay (HOSPITAL_BASED_OUTPATIENT_CLINIC_OR_DEPARTMENT_OTHER): Payer: Medicare Other | Admitting: Hematology

## 2018-11-21 ENCOUNTER — Inpatient Hospital Stay (HOSPITAL_COMMUNITY): Payer: Medicare Other

## 2018-11-21 ENCOUNTER — Encounter (HOSPITAL_COMMUNITY): Payer: Self-pay | Admitting: Hematology

## 2018-11-21 ENCOUNTER — Encounter (HOSPITAL_COMMUNITY): Payer: Self-pay

## 2018-11-21 VITALS — BP 136/66 | HR 70 | Temp 97.1°F | Resp 18

## 2018-11-21 VITALS — Wt 199.5 lb

## 2018-11-21 DIAGNOSIS — Z87891 Personal history of nicotine dependence: Secondary | ICD-10-CM | POA: Diagnosis not present

## 2018-11-21 DIAGNOSIS — Z79899 Other long term (current) drug therapy: Secondary | ICD-10-CM | POA: Diagnosis not present

## 2018-11-21 DIAGNOSIS — Z950 Presence of cardiac pacemaker: Secondary | ICD-10-CM | POA: Diagnosis not present

## 2018-11-21 DIAGNOSIS — N400 Enlarged prostate without lower urinary tract symptoms: Secondary | ICD-10-CM | POA: Diagnosis not present

## 2018-11-21 DIAGNOSIS — Z95828 Presence of other vascular implants and grafts: Secondary | ICD-10-CM

## 2018-11-21 DIAGNOSIS — I480 Paroxysmal atrial fibrillation: Secondary | ICD-10-CM | POA: Diagnosis not present

## 2018-11-21 DIAGNOSIS — C3492 Malignant neoplasm of unspecified part of left bronchus or lung: Secondary | ICD-10-CM

## 2018-11-21 DIAGNOSIS — R634 Abnormal weight loss: Secondary | ICD-10-CM | POA: Diagnosis not present

## 2018-11-21 DIAGNOSIS — E7800 Pure hypercholesterolemia, unspecified: Secondary | ICD-10-CM | POA: Diagnosis not present

## 2018-11-21 DIAGNOSIS — I252 Old myocardial infarction: Secondary | ICD-10-CM | POA: Diagnosis not present

## 2018-11-21 DIAGNOSIS — Z7982 Long term (current) use of aspirin: Secondary | ICD-10-CM | POA: Diagnosis not present

## 2018-11-21 DIAGNOSIS — Z7901 Long term (current) use of anticoagulants: Secondary | ICD-10-CM | POA: Diagnosis not present

## 2018-11-21 DIAGNOSIS — I2510 Atherosclerotic heart disease of native coronary artery without angina pectoris: Secondary | ICD-10-CM | POA: Diagnosis not present

## 2018-11-21 DIAGNOSIS — Z955 Presence of coronary angioplasty implant and graft: Secondary | ICD-10-CM | POA: Diagnosis not present

## 2018-11-21 DIAGNOSIS — K219 Gastro-esophageal reflux disease without esophagitis: Secondary | ICD-10-CM | POA: Diagnosis not present

## 2018-11-21 DIAGNOSIS — I129 Hypertensive chronic kidney disease with stage 1 through stage 4 chronic kidney disease, or unspecified chronic kidney disease: Secondary | ICD-10-CM | POA: Diagnosis not present

## 2018-11-21 DIAGNOSIS — N182 Chronic kidney disease, stage 2 (mild): Secondary | ICD-10-CM | POA: Diagnosis not present

## 2018-11-21 DIAGNOSIS — Z5111 Encounter for antineoplastic chemotherapy: Secondary | ICD-10-CM | POA: Diagnosis not present

## 2018-11-21 HISTORY — DX: Presence of other vascular implants and grafts: Z95.828

## 2018-11-21 LAB — CBC WITH DIFFERENTIAL/PLATELET
Abs Immature Granulocytes: 0.02 10*3/uL (ref 0.00–0.07)
Basophils Absolute: 0 10*3/uL (ref 0.0–0.1)
Basophils Relative: 1 %
Eosinophils Absolute: 0.5 10*3/uL (ref 0.0–0.5)
Eosinophils Relative: 8 %
HCT: 32.8 % — ABNORMAL LOW (ref 39.0–52.0)
Hemoglobin: 10.8 g/dL — ABNORMAL LOW (ref 13.0–17.0)
Immature Granulocytes: 0 %
Lymphocytes Relative: 12 %
Lymphs Abs: 0.7 10*3/uL (ref 0.7–4.0)
MCH: 31.9 pg (ref 26.0–34.0)
MCHC: 32.9 g/dL (ref 30.0–36.0)
MCV: 96.8 fL (ref 80.0–100.0)
Monocytes Absolute: 0.5 10*3/uL (ref 0.1–1.0)
Monocytes Relative: 8 %
Neutro Abs: 4.2 10*3/uL (ref 1.7–7.7)
Neutrophils Relative %: 71 %
Platelets: 206 10*3/uL (ref 150–400)
RBC: 3.39 MIL/uL — ABNORMAL LOW (ref 4.22–5.81)
RDW: 14.5 % (ref 11.5–15.5)
WBC: 5.9 10*3/uL (ref 4.0–10.5)
nRBC: 0 % (ref 0.0–0.2)

## 2018-11-21 LAB — COMPREHENSIVE METABOLIC PANEL
ALT: 11 U/L (ref 0–44)
AST: 21 U/L (ref 15–41)
Albumin: 3.9 g/dL (ref 3.5–5.0)
Alkaline Phosphatase: 58 U/L (ref 38–126)
Anion gap: 8 (ref 5–15)
BUN: 8 mg/dL (ref 8–23)
CO2: 27 mmol/L (ref 22–32)
Calcium: 9.1 mg/dL (ref 8.9–10.3)
Chloride: 93 mmol/L — ABNORMAL LOW (ref 98–111)
Creatinine, Ser: 1.03 mg/dL (ref 0.61–1.24)
GFR calc Af Amer: >60 mL/min (ref >60)
GFR calc non Af Amer: >60 mL/min (ref >60)
Glucose, Bld: 108 mg/dL — ABNORMAL HIGH (ref 70–99)
Potassium: 3.7 mmol/L (ref 3.5–5.1)
Sodium: 128 mmol/L — ABNORMAL LOW (ref 135–145)
Total Bilirubin: 1.2 mg/dL (ref 0.3–1.2)
Total Protein: 7.4 g/dL (ref 6.5–8.1)

## 2018-11-21 MED ORDER — DIPHENHYDRAMINE HCL 50 MG/ML IJ SOLN
50.0000 mg | Freq: Once | INTRAMUSCULAR | Status: AC
  Administered 2018-11-21: 50 mg via INTRAVENOUS
  Filled 2018-11-21: qty 1

## 2018-11-21 MED ORDER — SODIUM CHLORIDE 0.9 % IV SOLN
Freq: Once | INTRAVENOUS | Status: AC
  Administered 2018-11-21: 11:00:00 via INTRAVENOUS

## 2018-11-21 MED ORDER — SODIUM CHLORIDE 0.9% FLUSH
10.0000 mL | INTRAVENOUS | Status: DC | PRN
  Administered 2018-11-21: 10 mL
  Filled 2018-11-21: qty 10

## 2018-11-21 MED ORDER — HEPARIN SOD (PORK) LOCK FLUSH 100 UNIT/ML IV SOLN
500.0000 [IU] | Freq: Once | INTRAVENOUS | Status: AC | PRN
  Administered 2018-11-21: 500 [IU]

## 2018-11-21 MED ORDER — SODIUM CHLORIDE 0.9 % IV SOLN
45.0000 mg/m2 | Freq: Once | INTRAVENOUS | Status: AC
  Administered 2018-11-21: 13:00:00 96 mg via INTRAVENOUS
  Filled 2018-11-21: qty 16

## 2018-11-21 MED ORDER — FAMOTIDINE IN NACL 20-0.9 MG/50ML-% IV SOLN
20.0000 mg | Freq: Once | INTRAVENOUS | Status: AC
  Administered 2018-11-21: 20 mg via INTRAVENOUS
  Filled 2018-11-21: qty 50

## 2018-11-21 MED ORDER — SODIUM CHLORIDE 0.9 % IV SOLN
20.0000 mg | Freq: Once | INTRAVENOUS | Status: AC
  Administered 2018-11-21: 11:00:00 20 mg via INTRAVENOUS
  Filled 2018-11-21: qty 20

## 2018-11-21 MED ORDER — SODIUM CHLORIDE 0.9 % IV SOLN
189.4000 mg | Freq: Once | INTRAVENOUS | Status: AC
  Administered 2018-11-21: 14:00:00 190 mg via INTRAVENOUS
  Filled 2018-11-21: qty 19

## 2018-11-21 MED ORDER — PALONOSETRON HCL INJECTION 0.25 MG/5ML
0.2500 mg | Freq: Once | INTRAVENOUS | Status: AC
  Administered 2018-11-21: 11:00:00 0.25 mg via INTRAVENOUS
  Filled 2018-11-21: qty 5

## 2018-11-21 NOTE — Patient Instructions (Addendum)
[facility] at [facility] Discharge Instructions  You were seen today by Dr. Katragadda. He went over your recent lab results. He will see you back in 1 week for labs and follow up.   Thank you for choosing [facility] at [facility] to provide your oncology and hematology care.  To afford each patient quality time with our provider, please arrive at least 15 minutes before your scheduled appointment time.   If you have a lab appointment with the Cancer Center please come in thru the  Main Entrance and check in at the main information desk  You need to re-schedule your appointment should you arrive 10 or more minutes late.  We strive to give you quality time with our providers, and arriving late affects you and other patients whose appointments are after yours.  Also, if you no show three or more times for appointments you may be dismissed from the clinic at the providers discretion.     Again, thank you for choosing [facility].  Our hope is that these requests will decrease the amount of time that you wait before being seen by our physicians.       _____________________________________________________________  Should you have questions after your visit to [facility], please contact our office at [phone] between the hours of 8:00 a.m. and 4:30 p.m.  Voicemails left after 4:00 p.m. will not be returned until the following business day.  For prescription refill requests, have your pharmacy contact our office and allow 72 hours.    Cancer Center Support Programs:   > Cancer Support Group  2nd Tuesday of the month 1pm-2pm, Journey Room    

## 2018-11-21 NOTE — Patient Instructions (Signed)
[facility] Discharge Instructions for Patients Receiving Chemotherapy  Today you received the following chemotherapy agents   To help prevent nausea and vomiting after your treatment, we encourage you to take your nausea medication   If you develop nausea and vomiting that is not controlled by your nausea medication, call the clinic.   BELOW ARE SYMPTOMS THAT SHOULD BE REPORTED IMMEDIATELY:  *FEVER GREATER THAN 100.5 F  *CHILLS WITH OR WITHOUT FEVER  NAUSEA AND VOMITING THAT IS NOT CONTROLLED WITH YOUR NAUSEA MEDICATION  *UNUSUAL SHORTNESS OF BREATH  *UNUSUAL BRUISING OR BLEEDING  TENDERNESS IN MOUTH AND THROAT WITH OR WITHOUT PRESENCE OF ULCERS  *URINARY PROBLEMS  *BOWEL PROBLEMS  UNUSUAL RASH Items with * indicate a potential emergency and should be followed up as soon as possible.  Feel free to call the clinic should you have any questions or concerns. The clinic phone number is [phone].  Please show the CHEMO ALERT CARD at check-in to the Emergency Department and triage nurse.   

## 2018-11-21 NOTE — Progress Notes (Signed)
Pt presents today for treatment and f/u with Dr. Delton Coombes. Labs pending. VS within parameters for tx. MAR reviewed.   Labs reviewed by Dr. Delton Coombes. Message received proceed with tx today.   Treatment given today per MD orders. Tolerated infusion without adverse affects. Vital signs stable. No complaints at this time. Discharged from clinic via wheel chair. Schedule printed and given to patient.  F/U with [facility] as scheduled.

## 2018-11-21 NOTE — Progress Notes (Signed)
[address], [place] [address]   CLINIC:  Medical Oncology/Hematology  PCP:  Sinda Du, [place] [address] [phone]   REASON FOR VISIT:  Follow-up for lung cancer.  CURRENT THERAPY: Recommended combination chemoradiation therapy.   CANCER STAGING: Cancer Staging Squamous cell carcinoma of lung, left (HCC) Staging form: Lung, AJCC 8th Edition - Clinical stage from 11/09/2018: Stage IIIB (cT1c, cN3, cM0) - Unsigned    INTERVAL HISTORY:  Mr. Boley 85 y.o. male seen for follow-up of lung cancer.  He was admitted to the hospital on 11/16/2018 with hemoptysis.  It gradually improved and was discharged home on 11/19/2018.  While he was in the hospital, aspirin, Plavix were also held.  He had a port placed by Dr. Arnoldo Morale.  He reportedly started back on Xarelto yesterday.  He has experienced another episode of hemoptysis yesterday.  Denies any tingling or numbness next 20s.  Appetite is 50%.  Energy levels are 25%.  He reportedly did not start radiation therapy today, as the machine broke down in [place].    REVIEW OF SYSTEMS:  Review of Systems  Respiratory: Positive for cough and hemoptysis.   All other systems reviewed and are negative.    PAST MEDICAL/SURGICAL HISTORY:  Past Medical History:  Diagnosis Date   CAD (coronary artery disease)    a. STEMI 04/2008 s/p BMS to prox & distal RCA, staged DES to LAD same admission. b. Nuc 03/2017: scar but no ischemia, EF 45-54%. c. 09/2017: NSTEMI - DES x 2 to proximal and mid-RCA   Cancer (HCC)    CKD (chronic kidney disease), stage II    Cough, persistent    GERD (gastroesophageal reflux disease)    History of BPH    HTN (hypertension)    Hypercholesteremia    LV dysfunction    a. EF 45-50% in 01/2016.   Mild pulmonary hypertension (HCC)    PAF (paroxysmal atrial fibrillation) ([place])    Port-A-Cath in place 11/21/2018   Presence of permanent cardiac  pacemaker    Sinus node dysfunction ([place])    a. s/p StJude CRT-pacemaker 01/2016.   Past Surgical History:  Procedure Laterality Date   CARDIAC CATHETERIZATION     COLONOSCOPY     COLONOSCOPY N/A 08/27/2017   Dr. [name]: 25 5-25 mm polyps in descending colon, ascending, cecum. 2X3 cm carpet polyp in base of cecum lifted away s/p piecemeal polypectomy. Tubulovillous adenoma and tubular adenomas   CORONARY STENT INTERVENTION N/A 10/22/2017   Procedure: CORONARY STENT INTERVENTION;  Surgeon: [name], MD;  Location: [place] [facility] [facility];  Service: Cardiovascular;  Laterality: N/A;   CORONARY STENT PLACEMENT  04/2008   RCA and LAD   EP IMPLANTABLE DEVICE N/A 01/09/2016   Procedure: BiV Pacemaker Insertion CRT-P;  Surgeon: [name], MD;  Location: [place] [facility];  Service: Cardiovascular;  Laterality: N/A;   ESOPHAGOGASTRODUODENOSCOPY N/A 08/27/2017   normal esophagus s/p dilation, gastric petechia, small hiatal hernia, normal duodenum   INSERT / REPLACE / REMOVE PACEMAKER     MALONEY DILATION N/A 08/27/2017   Procedure: MALONEY DILATION;  Surgeon: [name], MD;  [facility];  Service: Endoscopy;  Laterality: N/A;   RIGHT/LEFT HEART CATH AND CORONARY ANGIOGRAPHY N/A 10/21/2017   Procedure: RIGHT/LEFT HEART CATH AND CORONARY ANGIOGRAPHY;  Surgeon: [name], MD;  Location: [place] [facility];  Service: Cardiovascular;  Laterality: N/A;     SOCIAL HISTORY:  Social  History   Socioeconomic History   Marital status: Married    Spouse name: ester   Number of children: 3   Years of education: Not on file   Highest education level: Not on file  Occupational History   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   Food insecurity    Worry: Never true    Inability: Never true   Transportation needs    Medical: No    Non-medical: No  Tobacco Use   Smoking status: Former Smoker     Packs/day: 2.50    Years: 25.00    Pack years: 62.50    Types: Cigarettes    Quit date: 06/14/1973    Years since quitting: 45.4   Smokeless tobacco: Former Systems developer    Types: Chew    Quit date: 06/14/1973   Tobacco comment: some/chewed very little for about 2 years  Substance and Sexual Activity   Alcohol use: No   Drug use: No   Sexual activity: Not on file  Lifestyle   Physical activity    Days per week: 0 days    Minutes per session: 0 min   Stress: Not at all  Relationships   Social connections    Talks on phone: More than three times a week    Gets together: More than three times a week    Attends religious service: More than 4 times per year    Active member of club or organization: No    Attends meetings of clubs or organizations: Never    Relationship status: Married   Intimate partner violence    Fear of current or ex partner: No    Emotionally abused: No    Physically abused: No    Forced sexual activity: No  Other Topics Concern   Not on file  Social History Narrative   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     FAMILY HISTORY:  Family History  Problem Relation Age of Onset   Emphysema Mother        mother died with emphysema and cancer   Cancer Mother    Prostate cancer Brother    Heart disease Brother    Hypothyroidism Daughter    Hypothyroidism Daughter    Colon cancer Neg Hx    Colon polyps Neg Hx     CURRENT MEDICATIONS:  Outpatient Encounter Medications as of 11/21/2018  Medication Sig   acetaminophen (TYLENOL) 500 MG tablet Take 500 mg by mouth every 6 (six) hours as needed for pain.   CARBOPLATIN IV Inject into the vein once a week.   cholecalciferol (VITAMIN D) 1000 units tablet Take 1,000 Units by mouth 2 (two) times daily.   dorzolamide-timolol (COSOPT) 22.3-6.8 MG/ML ophthalmic solution Place 1 drop into the left eye 2 (two) times daily.   ferrous sulfate 325 (65 FE) MG tablet Take 325 mg by mouth daily.     hydrochlorothiazide (HYDRODIURIL) 25 MG tablet TAKE ONE (1) TABLET BY MOUTH EVERY DAY (Patient taking differently: Take 25 mg by mouth daily. )   lidocaine-prilocaine (EMLA) cream Apply a dime-sized amount to port a cath site and cover with plastic wrap one hour prior to chemotherapy appointments   losartan (COZAAR) 25 MG tablet TAKE ONE TABLET BY MOUTY EVERY DAY (Patient taking differently: Take 25 mg by mouth daily. )   lovastatin (MEVACOR) 10 MG tablet TAKE ONE (1) TABLET BY MOUTH EVERY DAY   metoprolol  succinate (TOPROL XL) 25 MG 24 hr tablet Take 1 tablet (25 mg total) by mouth daily.   PACLITAXEL IV Inject into the vein once a week.   pantoprazole (PROTONIX) 40 MG tablet Take 40 mg by mouth daily.   prochlorperazine (COMPAZINE) 10 MG tablet Take 1 tablet (10 mg total) by mouth every 6 (six) hours as needed for nausea or vomiting.   Propylene Glycol (SYSTANE BALANCE OP) Place 1 drop into both eyes 2 (two) times daily as needed (dry eyes).    tamsulosin (FLOMAX) 0.4 MG CAPS capsule Take 0.4 mg by mouth daily.   triamcinolone cream (KENALOG) 0.1 % Apply 1 application topically daily as needed (itching).    vitamin B-12 (CYANOCOBALAMIN) 1000 MCG tablet Take 1,000 mcg by mouth daily.   [DISCONTINUED] rivaroxaban (XARELTO) 20 MG TABS tablet Take 1 tablet (20 mg total) by mouth daily with supper. Dx: paroxysmal atrial fibrillation   No facility-administered encounter medications on file as of 11/21/2018.     ALLERGIES:  Allergies  Allergen Reactions   Ace Inhibitors Nausea Only   Tape Rash    And Bandaids, too     PHYSICAL EXAM:  ECOG Performance status: 1  There were no vitals filed for this visit. Filed Weights   11/21/18 0908  Weight: 199 lb 8 oz (90.5 kg)    Physical Exam Vitals signs reviewed.  Constitutional:      Appearance: Normal appearance.  Cardiovascular:     Rate and Rhythm: Normal rate and regular rhythm.     Heart sounds: Normal heart sounds.    Pulmonary:     Effort: Pulmonary effort is normal.     Breath sounds: Normal breath sounds.  Abdominal:     General: There is no distension.     Palpations: Abdomen is soft. There is no mass.  Musculoskeletal:        General: No swelling.  Skin:    General: Skin is warm.  Neurological:     General: No focal deficit present.     Mental Status: He is alert and oriented to person, place, and time.  Psychiatric:        Mood and Affect: Mood normal.        Behavior: Behavior normal.      LABORATORY DATA:  I have reviewed the labs as listed.  CBC    Component Value Date/Time   WBC 5.9 11/21/2018 0843   RBC 3.39 (L) 11/21/2018 0843   HGB 10.8 (L) 11/21/2018 0843   HCT 32.8 (L) 11/21/2018 0843   PLT 206 11/21/2018 0843   MCV 96.8 11/21/2018 0843   MCH 31.9 11/21/2018 0843   MCHC 32.9 11/21/2018 0843   RDW 14.5 11/21/2018 0843   LYMPHSABS 0.7 11/21/2018 0843   MONOABS 0.5 11/21/2018 0843   EOSABS 0.5 11/21/2018 0843   BASOSABS 0.0 11/21/2018 0843   CMP Latest Ref Rng & Units 11/21/2018 11/18/2018 11/17/2018  Glucose 70 - 99 mg/dL 108(H) 87 83  BUN 8 - 23 mg/dL 8 13 12   Creatinine 0.61 - 1.24 mg/dL 1.03 0.94 0.96  Sodium 135 - 145 mmol/L 128(L) 125(L) 126(L)  Potassium 3.5 - 5.1 mmol/L 3.7 4.0 4.0  Chloride 98 - 111 mmol/L 93(L) 94(L) 91(L)  CO2 22 - 32 mmol/L 27 25 28   Calcium 8.9 - 10.3 mg/dL 9.1 8.4(L) 8.7(L)  Total Protein 6.5 - 8.1 g/dL 7.4 - -  Total Bilirubin 0.3 - 1.2 mg/dL 1.2 - -  Alkaline Phos 38 - 126 U/L 58 - -  AST 15 - 41 U/L 21 - -  ALT 0 - 44 U/L 11 - -       DIAGNOSTIC IMAGING:  I have independently reviewed the scans and discussed with the patient.    ASSESSMENT & PLAN:   Squamous cell carcinoma of lung, left ([place]) 1.  [name] squamous cell carcinoma of the left lung: - PET scan on 10/17/2018 shows hypermetabolic left lower lobe nodule, left hilar and contralateral right lower paratracheal lymph node along with hypermetabolic right hilar lymph  nodes.  Hypermetabolic subcarinal lymph node present.  Hypermetabolic lymph node in the porta hepatis with SUV 4.9, measuring 1.1 cm, favoring reactive adenopathy. - 26 pound weight loss in the last 1 year.  Ex-smoker quit in 1975. - Left lower lobe lung nodule biopsy on 10/31/2018 consistent with squamous cell carcinoma. -Admission from 11/16/2018 through 11/19/2018 with hemoptysis.  Patient reportedly started back on Xarelto on 11/20/2018.  He had one episode of hemoptysis yesterday.  He is off of aspirin and Plavix. -As he had hemoptysis again yesterday, I have recommended to hold off on Xarelto at this time.  He may go back on aspirin 81 mg daily.  If his hemoptysis completely stops after few weeks of chemoradiation therapy, we will consider restarting Xarelto. - He will start radiation tomorrow in [place]. -We talked about starting him on weekly carboplatin and paclitaxel.  We talked about side effects in detail. -Reviewed his labs.  He will proceed with first treatment today.  I will see him back in 1 week for follow-up.  2.  Paroxysmal atrial fibrillation: -He is on Xarelto 20 mg daily.  He was admitted with hemoptysis from 11/16/2018-11/19/2018. - He was started back on Xarelto yesterday and had 1 more episode of hemoptysis.  I will hold his Xarelto.  2.  CAD: -He had MI x2, in 2010 and August 2019.  He had a pacemaker put in November 2018.  Aspirin and Plavix are on hold since hemoptysis. -I have told him to go back on aspirin 81 mg.   Total time spent is 40 minutes with more than 50% of the time spent face-to-face discussing treatment plan, counseling and coordination of care.  Orders placed this encounter:  Orders Placed This Encounter  Procedures   CBC with Differential/Platelet   Comprehensive metabolic panel   Iron and TIBC   Ferritin   Vitamin B12   Folate      Derek Jack, MD [place] [phone]

## 2018-11-21 NOTE — Assessment & Plan Note (Signed)
1.  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  Hypermetabolic subcarinal lymph node present.  Hypermetabolic lymph node in the porta hepatis with SUV 4.9, measuring 1.1 cm, favoring reactive adenopathy. - 26 pound weight loss in the last 1 year.  Ex-smoker quit in 1975. - Left lower lobe lung nodule biopsy on 10/31/2018 consistent with squamous cell carcinoma. -Admission from 11/16/2018 through 11/19/2018 with hemoptysis.  Patient reportedly started back on Xarelto on 11/20/2018.  He had one episode of hemoptysis yesterday.  He is off of aspirin and Plavix. -As he had hemoptysis again yesterday, I have recommended to hold off on Xarelto at this time.  He may go back on aspirin 81 mg daily.  If his hemoptysis completely stops after few weeks of chemoradiation therapy, we will consider restarting Xarelto. - He will start radiation tomorrow in [place]. -We talked about starting him on weekly carboplatin and paclitaxel.  We talked about side effects in detail. -Reviewed his labs.  He will proceed with first treatment today.  I will see him back in 1 week for follow-up.  2.  Paroxysmal atrial fibrillation: -He is on Xarelto 20 mg daily.  He was admitted with hemoptysis from 11/16/2018-11/19/2018. - He was started back on Xarelto yesterday and had 1 more episode of hemoptysis.  I will hold his Xarelto.  2.  CAD: -He had MI x2, in 2010 and August 2019.  He had a pacemaker put in November 2018.  Aspirin and Plavix are on hold since hemoptysis. -I have told him to go back on aspirin 81 mg.

## 2018-11-21 NOTE — Progress Notes (Signed)

## 2018-11-22 ENCOUNTER — Other Ambulatory Visit (HOSPITAL_COMMUNITY): Payer: Self-pay | Admitting: *Deleted

## 2018-11-22 DIAGNOSIS — C3490 Malignant neoplasm of unspecified part of unspecified bronchus or lung: Secondary | ICD-10-CM | POA: Diagnosis not present

## 2018-11-22 DIAGNOSIS — I482 Chronic atrial fibrillation, unspecified: Secondary | ICD-10-CM | POA: Diagnosis not present

## 2018-11-22 DIAGNOSIS — C3432 Malignant neoplasm of lower lobe, left bronchus or lung: Secondary | ICD-10-CM | POA: Diagnosis not present

## 2018-11-22 DIAGNOSIS — Z23 Encounter for immunization: Secondary | ICD-10-CM | POA: Diagnosis not present

## 2018-11-22 DIAGNOSIS — R042 Hemoptysis: Secondary | ICD-10-CM | POA: Diagnosis not present

## 2018-11-22 DIAGNOSIS — Z950 Presence of cardiac pacemaker: Secondary | ICD-10-CM | POA: Diagnosis not present

## 2018-11-22 DIAGNOSIS — I2510 Atherosclerotic heart disease of native coronary artery without angina pectoris: Secondary | ICD-10-CM | POA: Diagnosis not present

## 2018-11-22 DIAGNOSIS — Z510 Encounter for antineoplastic radiation therapy: Secondary | ICD-10-CM | POA: Diagnosis not present

## 2018-11-22 MED ORDER — SCOPOLAMINE 1 MG/3DAYS TD PT72: 1.0000 | MEDICATED_PATCH | TRANSDERMAL | 12 refills | Status: DC

## 2018-11-22 MED ORDER — ONDANSETRON 8 MG PO TBDP: 8.0000 mg | ORAL_TABLET | Freq: Two times a day (BID) | ORAL | 3 refills | Status: DC | PRN

## 2018-11-22 NOTE — Progress Notes (Signed)
Patient spoke with the nurses at [facility] this morning about needing something for nausea. He has been taking the compazine as prescribed without relief.  I spoke with Dr. Delton Coombes and orders received for Zofran 8 mg ODT every 12 hours prn nausea and scopolamine patch ever 72 hours prn nausea.  Orders sent to his pharmacy and patient is aware of changes.   He reports being able to keep down chips and crackers today.

## 2018-11-23 ENCOUNTER — Other Ambulatory Visit (HOSPITAL_COMMUNITY): Payer: Self-pay | Admitting: *Deleted

## 2018-11-23 DIAGNOSIS — C3432 Malignant neoplasm of lower lobe, left bronchus or lung: Secondary | ICD-10-CM | POA: Diagnosis not present

## 2018-11-23 DIAGNOSIS — Z950 Presence of cardiac pacemaker: Secondary | ICD-10-CM | POA: Diagnosis not present

## 2018-11-23 DIAGNOSIS — Z510 Encounter for antineoplastic radiation therapy: Secondary | ICD-10-CM | POA: Diagnosis not present

## 2018-11-23 MED ORDER — SCOPOLAMINE 1 MG/3DAYS TD PT72: 1.0000 | MEDICATED_PATCH | TRANSDERMAL | 12 refills | Status: DC

## 2018-11-24 DIAGNOSIS — Z510 Encounter for antineoplastic radiation therapy: Secondary | ICD-10-CM | POA: Diagnosis not present

## 2018-11-24 DIAGNOSIS — C3432 Malignant neoplasm of lower lobe, left bronchus or lung: Secondary | ICD-10-CM | POA: Diagnosis not present

## 2018-11-24 DIAGNOSIS — Z950 Presence of cardiac pacemaker: Secondary | ICD-10-CM | POA: Diagnosis not present

## 2018-11-25 DIAGNOSIS — Z950 Presence of cardiac pacemaker: Secondary | ICD-10-CM | POA: Diagnosis not present

## 2018-11-25 DIAGNOSIS — Z510 Encounter for antineoplastic radiation therapy: Secondary | ICD-10-CM | POA: Diagnosis not present

## 2018-11-25 DIAGNOSIS — C3432 Malignant neoplasm of lower lobe, left bronchus or lung: Secondary | ICD-10-CM | POA: Diagnosis not present

## 2018-11-28 ENCOUNTER — Inpatient Hospital Stay (HOSPITAL_COMMUNITY): Payer: Medicare Other

## 2018-11-28 ENCOUNTER — Other Ambulatory Visit: Payer: Self-pay

## 2018-11-28 ENCOUNTER — Encounter (HOSPITAL_COMMUNITY): Payer: Self-pay | Admitting: Hematology

## 2018-11-28 ENCOUNTER — Inpatient Hospital Stay (HOSPITAL_BASED_OUTPATIENT_CLINIC_OR_DEPARTMENT_OTHER): Payer: Medicare Other | Admitting: Hematology

## 2018-11-28 VITALS — BP 102/59 | HR 70 | Temp 97.9°F | Resp 18

## 2018-11-28 DIAGNOSIS — Z955 Presence of coronary angioplasty implant and graft: Secondary | ICD-10-CM | POA: Diagnosis not present

## 2018-11-28 DIAGNOSIS — I480 Paroxysmal atrial fibrillation: Secondary | ICD-10-CM | POA: Diagnosis not present

## 2018-11-28 DIAGNOSIS — C3492 Malignant neoplasm of unspecified part of left bronchus or lung: Secondary | ICD-10-CM

## 2018-11-28 DIAGNOSIS — K219 Gastro-esophageal reflux disease without esophagitis: Secondary | ICD-10-CM | POA: Diagnosis not present

## 2018-11-28 DIAGNOSIS — Z5111 Encounter for antineoplastic chemotherapy: Secondary | ICD-10-CM | POA: Diagnosis not present

## 2018-11-28 DIAGNOSIS — Z950 Presence of cardiac pacemaker: Secondary | ICD-10-CM | POA: Diagnosis not present

## 2018-11-28 DIAGNOSIS — C3432 Malignant neoplasm of lower lobe, left bronchus or lung: Secondary | ICD-10-CM | POA: Diagnosis not present

## 2018-11-28 DIAGNOSIS — Z95828 Presence of other vascular implants and grafts: Secondary | ICD-10-CM

## 2018-11-28 DIAGNOSIS — Z510 Encounter for antineoplastic radiation therapy: Secondary | ICD-10-CM | POA: Diagnosis not present

## 2018-11-28 DIAGNOSIS — E7800 Pure hypercholesterolemia, unspecified: Secondary | ICD-10-CM | POA: Diagnosis not present

## 2018-11-28 DIAGNOSIS — N182 Chronic kidney disease, stage 2 (mild): Secondary | ICD-10-CM | POA: Diagnosis not present

## 2018-11-28 DIAGNOSIS — Z79899 Other long term (current) drug therapy: Secondary | ICD-10-CM | POA: Diagnosis not present

## 2018-11-28 DIAGNOSIS — I129 Hypertensive chronic kidney disease with stage 1 through stage 4 chronic kidney disease, or unspecified chronic kidney disease: Secondary | ICD-10-CM | POA: Diagnosis not present

## 2018-11-28 DIAGNOSIS — Z7901 Long term (current) use of anticoagulants: Secondary | ICD-10-CM | POA: Diagnosis not present

## 2018-11-28 DIAGNOSIS — Z7982 Long term (current) use of aspirin: Secondary | ICD-10-CM | POA: Diagnosis not present

## 2018-11-28 DIAGNOSIS — I252 Old myocardial infarction: Secondary | ICD-10-CM | POA: Diagnosis not present

## 2018-11-28 DIAGNOSIS — Z87891 Personal history of nicotine dependence: Secondary | ICD-10-CM | POA: Diagnosis not present

## 2018-11-28 DIAGNOSIS — I2510 Atherosclerotic heart disease of native coronary artery without angina pectoris: Secondary | ICD-10-CM | POA: Diagnosis not present

## 2018-11-28 LAB — COMPREHENSIVE METABOLIC PANEL
ALT: 13 U/L (ref 0–44)
AST: 15 U/L (ref 15–41)
Albumin: 3.7 g/dL (ref 3.5–5.0)
Alkaline Phosphatase: 51 U/L (ref 38–126)
Anion gap: 10 (ref 5–15)
BUN: 17 mg/dL (ref 8–23)
CO2: 25 mmol/L (ref 22–32)
Calcium: 8.7 mg/dL — ABNORMAL LOW (ref 8.9–10.3)
Chloride: 90 mmol/L — ABNORMAL LOW (ref 98–111)
Creatinine, Ser: 0.88 mg/dL (ref 0.61–1.24)
GFR calc Af Amer: >60 mL/min (ref >60)
GFR calc non Af Amer: >60 mL/min (ref >60)
Glucose, Bld: 133 mg/dL — ABNORMAL HIGH (ref 70–99)
Potassium: 3.5 mmol/L (ref 3.5–5.1)
Sodium: 125 mmol/L — ABNORMAL LOW (ref 135–145)
Total Bilirubin: 1.3 mg/dL — ABNORMAL HIGH (ref 0.3–1.2)
Total Protein: 7 g/dL (ref 6.5–8.1)

## 2018-11-28 LAB — CBC WITH DIFFERENTIAL/PLATELET
Abs Immature Granulocytes: 0.06 10*3/uL (ref 0.00–0.07)
Basophils Absolute: 0 10*3/uL (ref 0.0–0.1)
Basophils Relative: 1 %
Eosinophils Absolute: 0.2 10*3/uL (ref 0.0–0.5)
Eosinophils Relative: 3 %
HCT: 28.7 % — ABNORMAL LOW (ref 39.0–52.0)
Hemoglobin: 9.4 g/dL — ABNORMAL LOW (ref 13.0–17.0)
Immature Granulocytes: 1 %
Lymphocytes Relative: 6 %
Lymphs Abs: 0.4 10*3/uL — ABNORMAL LOW (ref 0.7–4.0)
MCH: 31.2 pg (ref 26.0–34.0)
MCHC: 32.8 g/dL (ref 30.0–36.0)
MCV: 95.3 fL (ref 80.0–100.0)
Monocytes Absolute: 0.3 10*3/uL (ref 0.1–1.0)
Monocytes Relative: 5 %
Neutro Abs: 4.7 10*3/uL (ref 1.7–7.7)
Neutrophils Relative %: 84 %
Platelets: 181 10*3/uL (ref 150–400)
RBC: 3.01 MIL/uL — ABNORMAL LOW (ref 4.22–5.81)
RDW: 13.8 % (ref 11.5–15.5)
WBC: 5.6 10*3/uL (ref 4.0–10.5)
nRBC: 0 % (ref 0.0–0.2)

## 2018-11-28 LAB — FOLATE: Folate: 8.3 ng/mL (ref >5.9)

## 2018-11-28 LAB — VITAMIN B12: Vitamin B-12: 916 pg/mL — ABNORMAL HIGH (ref 180–914)

## 2018-11-28 LAB — IRON AND TIBC
Iron: 45 ug/dL (ref 45–182)
Saturation Ratios: 15 % — ABNORMAL LOW (ref 17.9–39.5)
TIBC: 304 ug/dL (ref 250–450)
UIBC: 259 ug/dL

## 2018-11-28 LAB — FERRITIN: Ferritin: 135 ng/mL (ref 24–336)

## 2018-11-28 MED ORDER — SODIUM CHLORIDE 0.9 % IV SOLN
Freq: Once | INTRAVENOUS | Status: AC
  Administered 2018-11-28: 11:00:00 via INTRAVENOUS

## 2018-11-28 MED ORDER — SODIUM CHLORIDE 0.9% FLUSH
10.0000 mL | INTRAVENOUS | Status: DC | PRN
  Administered 2018-11-28: 10 mL
  Filled 2018-11-28: qty 10

## 2018-11-28 MED ORDER — DIPHENHYDRAMINE HCL 50 MG/ML IJ SOLN
50.0000 mg | Freq: Once | INTRAMUSCULAR | Status: AC
  Administered 2018-11-28: 11:00:00 50 mg via INTRAVENOUS
  Filled 2018-11-28: qty 1

## 2018-11-28 MED ORDER — PALONOSETRON HCL INJECTION 0.25 MG/5ML
0.2500 mg | Freq: Once | INTRAVENOUS | Status: AC
  Administered 2018-11-28: 11:00:00 0.25 mg via INTRAVENOUS
  Filled 2018-11-28: qty 5

## 2018-11-28 MED ORDER — HEPARIN SOD (PORK) LOCK FLUSH 100 UNIT/ML IV SOLN
500.0000 [IU] | Freq: Once | INTRAVENOUS | Status: AC | PRN
  Administered 2018-11-28: 14:00:00 500 [IU]

## 2018-11-28 MED ORDER — SODIUM CHLORIDE 0.9 % IV SOLN
45.0000 mg/m2 | Freq: Once | INTRAVENOUS | Status: AC
  Administered 2018-11-28: 12:00:00 96 mg via INTRAVENOUS
  Filled 2018-11-28: qty 16

## 2018-11-28 MED ORDER — SODIUM CHLORIDE 0.9 % IV SOLN
145.2000 mg | Freq: Once | INTRAVENOUS | Status: AC
  Administered 2018-11-28: 150 mg via INTRAVENOUS
  Filled 2018-11-28: qty 15

## 2018-11-28 MED ORDER — SODIUM CHLORIDE 0.9 % IV SOLN
20.0000 mg | Freq: Once | INTRAVENOUS | Status: AC
  Administered 2018-11-28: 20 mg via INTRAVENOUS
  Filled 2018-11-28: qty 20

## 2018-11-28 MED ORDER — FAMOTIDINE IN NACL 20-0.9 MG/50ML-% IV SOLN
20.0000 mg | Freq: Once | INTRAVENOUS | Status: AC
  Administered 2018-11-28: 11:00:00 20 mg via INTRAVENOUS
  Filled 2018-11-28: qty 50

## 2018-11-28 NOTE — Progress Notes (Signed)
Nicholas Sanchez, [place] [id number]   CLINIC:  Medical Oncology/Hematology  PCP:  Sinda Du, [place] [address] [phone]   REASON FOR VISIT:  Follow-up for lung cancer.  CURRENT THERAPY: Recommended combination chemoradiation therapy.   CANCER STAGING: Cancer Staging Squamous cell carcinoma of lung, left (HCC) Staging form: Lung, AJCC 8th Edition - Clinical stage from 11/09/2018: Stage IIIB (cT1c, cN3, cM0) - Unsigned    INTERVAL HISTORY:  Nicholas Sanchez 85 y.o. male seen for follow-up of lung cancer.  He started on chemoradiation therapy on 11/21/2018.  He lost his appetite and lost 6 pounds since last visit.  No more hemoptysis reported.  He complains of fatigue.  Appetite is 50% and energy levels are low.  Occasional cough is stable.  Constipation is present.    REVIEW OF SYSTEMS:  Review of Systems  Constitutional: Positive for fatigue and unexpected weight change.  Respiratory: Positive for cough.   Gastrointestinal: Positive for constipation.  All other systems reviewed and are negative.    PAST MEDICAL/SURGICAL HISTORY:  Past Medical History:  Diagnosis Date  . CAD (coronary artery disease)    a. STEMI 04/2008 s/p BMS to prox & distal RCA, staged DES to LAD same admission. b. Nuc 03/2017: scar but no ischemia, EF 45-54%. c. 09/2017: NSTEMI - DES x 2 to proximal and mid-RCA  . Cancer ([facility])   . CKD (chronic kidney disease), stage II   . Cough, persistent   . GERD (gastroesophageal reflux disease)   . History of BPH   . HTN (hypertension)   . Hypercholesteremia   . LV dysfunction    a. EF 45-50% in 01/2016.  . Mild pulmonary hypertension ([facility])   . PAF (paroxysmal atrial fibrillation) ([facility])   . Port-A-Cath in place 11/21/2018  . Presence of permanent cardiac pacemaker   . Sinus node dysfunction ([facility])    a. s/p StJude CRT-pacemaker 01/2016.   Past Surgical History:  Procedure Laterality Date  .  CARDIAC CATHETERIZATION    . COLONOSCOPY    . COLONOSCOPY N/A 08/27/2017   Dr. [name]: 25 5-25 mm polyps in descending colon, ascending, cecum. 2X3 cm carpet polyp in base of cecum lifted away s/p piecemeal polypectomy. Tubulovillous adenoma and tubular adenomas  . CORONARY STENT INTERVENTION N/A 10/22/2017   Procedure: CORONARY STENT INTERVENTION;  Surgeon: [name], MD;  Location: [facility];  Service: Cardiovascular;  Laterality: N/A;  . CORONARY STENT PLACEMENT  04/2008   RCA and LAD  . EP IMPLANTABLE DEVICE N/A 01/09/2016   Procedure: BiV Pacemaker Insertion CRT-P;  Surgeon: [name], MD;  Location: [facility];  Service: Cardiovascular;  Laterality: N/A;  . ESOPHAGOGASTRODUODENOSCOPY N/A 08/27/2017   normal esophagus s/p dilation, gastric petechia, small hiatal hernia, normal duodenum  . INSERT / REPLACE / REMOVE PACEMAKER    . MALONEY DILATION N/A 08/27/2017   Procedure: [name] DILATION;  Surgeon: [name], MD;  [facility];  Service: Endoscopy;  Laterality: N/A;  . PORTACATH PLACEMENT Right 11/18/2018   Procedure: INSERTION PORT-A-CATH;  Surgeon: [name], MD;  Location: [facility];  Service: General;  Laterality: Right;  . RIGHT/LEFT HEART CATH AND CORONARY ANGIOGRAPHY N/A 10/21/2017   Procedure: RIGHT/LEFT HEART CATH AND CORONARY ANGIOGRAPHY;  Surgeon: [name], MD;  Location: [facility];  Service: Cardiovascular;  Laterality: N/A;     SOCIAL HISTORY:  Social History   Socioeconomic History  .  Marital status: Married    Spouse name: Air traffic controller  . Number of children: 3  . Years of education: Not on file  . Highest education level: Not on file  Occupational History  . Occupation: retired    Comment: Geographical information systems officer  . Financial resource strain: Not very hard  . Food insecurity    Worry: Never true    Inability: Never true  . Transportation needs    Medical: No    Non-medical: No  Tobacco Use  . Smoking status:  Former Smoker    Packs/day: 2.50    Years: 25.00    Pack years: 62.50    Types: Cigarettes    Quit date: 06/14/1973    Years since quitting: 45.4  . Smokeless tobacco: Former Systems developer    Types: Chew    Quit date: 06/14/1973  . Tobacco comment: some/chewed very little for about 2 years  Substance and Sexual Activity  . Alcohol use: No  . Drug use: No  . Sexual activity: Not on file  Lifestyle  . Physical activity    Days per week: 0 days    Minutes per session: 0 min  . Stress: Not at all  Relationships  . Social connections    Talks on phone: More than three times a week    Gets together: More than three times a week    Attends religious service: More than 4 times per year    Active member of club or organization: No    Attends meetings of clubs or organizations: Never    Relationship status: Married  . Intimate partner violence    Fear of current or ex partner: No    Emotionally abused: No    Physically abused: No    Forced sexual activity: No  Other Topics Concern  . 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     FAMILY HISTORY:  Family History  Problem Relation Age of Onset  . Emphysema Mother        mother died with emphysema and cancer  . Cancer Mother   . Prostate cancer Brother   . Heart disease Brother   . Hypothyroidism Daughter   . Hypothyroidism Daughter   . Colon cancer Neg Hx   . Colon polyps Neg Hx     CURRENT MEDICATIONS:  Outpatient Encounter Medications as of 11/28/2018  Medication Sig Note  . aspirin EC 81 MG tablet Take 81 mg by mouth daily.   [name] CARBOPLATIN IV Inject into the vein once a week.   . cholecalciferol (VITAMIN D) 1000 units tablet Take 1,000 Units by mouth 2 (two) times daily.   . dorzolamide-timolol (COSOPT) 22.3-6.8 MG/ML ophthalmic solution Place 1 drop into the left eye 2 (two) times daily.   . ferrous sulfate 325 (65 FE) MG tablet Take 325 mg by mouth daily.    . hydrochlorothiazide  (HYDRODIURIL) 25 MG tablet TAKE ONE (1) TABLET BY MOUTH EVERY DAY (Patient taking differently: Take 25 mg by mouth daily. )   . lidocaine-prilocaine (EMLA) cream Apply a dime-sized amount to port a cath site and cover with plastic wrap one hour prior to chemotherapy appointments   . losartan (COZAAR) 25 MG tablet TAKE ONE TABLET BY MOUTY EVERY DAY (Patient taking differently: Take 25 mg by mouth daily. )   . lovastatin (MEVACOR) 10 MG tablet TAKE ONE (1) TABLET BY MOUTH EVERY DAY   . metoprolol succinate (TOPROL XL) 25  MG 24 hr tablet Take 1 tablet (25 mg total) by mouth daily.   [name] PACLITAXEL IV Inject into the vein once a week.   . pantoprazole (PROTONIX) 40 MG tablet Take 40 mg by mouth daily.   [name] Propylene Glycol (SYSTANE BALANCE OP) Place 1 drop into both eyes 2 (two) times daily as needed (dry eyes).    . tamsulosin (FLOMAX) 0.4 MG CAPS capsule Take 0.4 mg by mouth daily.   . vitamin B-12 (CYANOCOBALAMIN) 1000 MCG tablet Take 1,000 mcg by mouth daily.   [name] acetaminophen (TYLENOL) 500 MG tablet Take 500 mg by mouth every 6 (six) hours as needed for pain.   [name] ondansetron (ZOFRAN ODT) 8 MG disintegrating tablet Take 1 tablet (8 mg total) by mouth 2 (two) times daily as needed for nausea or vomiting. (Patient not taking: Reported on 11/28/2018)   . prochlorperazine (COMPAZINE) 10 MG tablet Take 1 tablet (10 mg total) by mouth every 6 (six) hours as needed for nausea or vomiting. (Patient not taking: Reported on 11/28/2018)   . scopolamine (TRANSDERM-SCOP) 1 MG/3DAYS Place 1 patch (1.5 mg total) onto the skin every 3 (three) days. (Patient not taking: Reported on 11/28/2018) 11/28/2018: Pt hasn't started yet  . triamcinolone cream (KENALOG) 0.1 % Apply 1 application topically daily as needed (itching).     No facility-administered encounter medications on file as of 11/28/2018.     ALLERGIES:  Allergies  Allergen Reactions  . Ace Inhibitors Nausea Only  . Tape Rash    And Bandaids, too      PHYSICAL EXAM:  ECOG Performance status: 1  Vitals:   11/28/18 0911  BP: 134/68  Pulse: 70  Resp: 18  Temp: (!) 97.5 F (36.4 C)  SpO2: 99%   Filed Weights   11/28/18 0911  Weight: 192 lb 12.8 oz (87.5 kg)    Physical Exam Vitals signs reviewed.  Constitutional:      Appearance: Normal appearance.  Cardiovascular:     Rate and Rhythm: Normal rate and regular rhythm.     Heart sounds: Normal heart sounds.  Pulmonary:     Effort: Pulmonary effort is normal.     Breath sounds: Normal breath sounds.  Abdominal:     General: There is no distension.     Palpations: Abdomen is soft. There is no mass.  Musculoskeletal:        General: No swelling.  Skin:    General: Skin is warm.  Neurological:     General: No focal deficit present.     Mental Status: He is alert and oriented to person, place, and time.  Psychiatric:        Mood and Affect: Mood normal.        Behavior: Behavior normal.      LABORATORY DATA:  I have reviewed the labs as listed.  CBC    Component Value Date/Time   WBC 5.6 11/28/2018 0926   RBC 3.01 (L) 11/28/2018 0926   HGB 9.4 (L) 11/28/2018 0926   HCT 28.7 (L) 11/28/2018 0926   PLT 181 11/28/2018 0926   MCV 95.3 11/28/2018 0926   MCH 31.2 11/28/2018 0926   MCHC 32.8 11/28/2018 0926   RDW 13.8 11/28/2018 0926   LYMPHSABS 0.4 (L) 11/28/2018 0926   MONOABS 0.3 11/28/2018 0926   EOSABS 0.2 11/28/2018 0926   BASOSABS 0.0 11/28/2018 0926   CMP Latest Ref Rng & Units 11/28/2018 11/21/2018 11/18/2018  Glucose 70 - 99 mg/dL 133(H) 108(H) 87  BUN  8 - 23 mg/dL 17 8 13   Creatinine 0.61 - 1.24 mg/dL 0.88 1.03 0.94  Sodium 135 - 145 mmol/L 125(L) 128(L) 125(L)  Potassium 3.5 - 5.1 mmol/L 3.5 3.7 4.0  Chloride 98 - 111 mmol/L 90(L) 93(L) 94(L)  CO2 22 - 32 mmol/L 25 27 25   Calcium 8.9 - 10.3 mg/dL 8.7(L) 9.1 8.4(L)  Total Protein 6.5 - 8.1 g/dL 7.0 7.4 -  Total Bilirubin 0.3 - 1.2 mg/dL 1.3(H) 1.2 -  Alkaline Phos 38 - 126 U/L 51 58 -  AST 15 - 41  U/L 15 21 -  ALT 0 - 44 U/L 13 11 -       DIAGNOSTIC IMAGING:  I have independently reviewed the scans and discussed with the patient.    ASSESSMENT & PLAN:   Squamous cell carcinoma of lung, left ([facility]) 1.  [name] squamous cell carcinoma left lung: -PET scan on 10/17/2018 showed hypermetabolic left lower lobe nodule, left hilar and contralateral right lower paratracheal lymph node along with right hilar lymph nodes.  Hypermetabolic subcarinal lymph nodes present.  Hypermetabolic lymph node in the porta hepatis with SUV 4.9, measuring 1.1 cm, favoring reactive adenopathy. -26 pound weight loss in the last 1 year, ex-smoker quit in 1975. -Left lower lobe lung biopsy on 10/31/2018 consistent with squamous cell carcinoma. - Weekly carbotaxol started on 11/21/2018 and radiation on 11/22/2018. - He had some fatigue and lost about 6 pounds in the last 1 week.  He was encouraged to start drinking boost/Ensure plus. - I have reviewed his labs.  I will decrease carboplatin AUC 1.5 and Taxol regular dose today. -We will reevaluate him in 1 week.  2.  Paroxysmal atrial fibrillation: - He was on Xarelto 20 mg daily.  He was hospitalized with hemoptysis from 11/16/2018 through 11/19/2018. - We held his Xarelto at this time.  3.  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  He had a pacemaker in November 2018. - Plavix is on hold since hemoptysis.  He will continue aspirin 81 mg daily.  Total time spent is 25 minutes with more than 50% of the time spent face-to-face discussing treatment plan, counseling and coordination of care.  Orders placed this encounter:  No orders of the defined types were placed in this encounter.     Derek Jack, MD [place] [phone]

## 2018-11-28 NOTE — Patient Instructions (Signed)
[facility] Discharge Instructions for Patients Receiving Chemotherapy  Today you received the following chemotherapy agents   To help prevent nausea and vomiting after your treatment, we encourage you to take your nausea medication   If you develop nausea and vomiting that is not controlled by your nausea medication, call the clinic.   BELOW ARE SYMPTOMS THAT SHOULD BE REPORTED IMMEDIATELY:  *FEVER GREATER THAN 100.5 F  *CHILLS WITH OR WITHOUT FEVER  NAUSEA AND VOMITING THAT IS NOT CONTROLLED WITH YOUR NAUSEA MEDICATION  *UNUSUAL SHORTNESS OF BREATH  *UNUSUAL BRUISING OR BLEEDING  TENDERNESS IN MOUTH AND THROAT WITH OR WITHOUT PRESENCE OF ULCERS  *URINARY PROBLEMS  *BOWEL PROBLEMS  UNUSUAL RASH Items with * indicate a potential emergency and should be followed up as soon as possible.  Feel free to call the clinic should you have any questions or concerns. The clinic phone number is [phone].  Please show the CHEMO ALERT CARD at check-in to the Emergency Department and triage nurse.   

## 2018-11-28 NOTE — Progress Notes (Signed)
Pt presents today for treatment and f/u with Dr. Delton Coombes. VS within parameters for tx. Pt has no complaints of pain today but has concerns about the Scopolamine patches ordered. Pt teaching pertaining to nausea. Pt denies any vomiting and states he is able to drink plenty of fluids.   Pt seen by Dr. Delton Coombes today. Message received from [facility] LPN to proceed with tx today with dose reduction per Dr. Delton Coombes.   Treatment given today per MD orders. Tolerated infusion without adverse affects. Vital signs stable. No complaints at this time. Discharged from clinic via wheel chair. F/U with [facility] as scheduled.

## 2018-11-28 NOTE — Assessment & Plan Note (Signed)
1.  [name] squamous cell carcinoma left lung: -PET scan on 10/17/2018 showed hypermetabolic left lower lobe nodule, left hilar and contralateral right lower paratracheal lymph node along with right hilar lymph nodes.  Hypermetabolic subcarinal lymph nodes present.  Hypermetabolic lymph node in the porta hepatis with SUV 4.9, measuring 1.1 cm, favoring reactive adenopathy. -26 pound weight loss in the last 1 year, ex-smoker quit in 1975. -Left lower lobe lung biopsy on 10/31/2018 consistent with squamous cell carcinoma. - Weekly carbotaxol started on 11/21/2018 and radiation on 11/22/2018. - He had some fatigue and lost about 6 pounds in the last 1 week.  He was encouraged to start drinking boost/Ensure plus. - I have reviewed his labs.  I will decrease carboplatin AUC 1.5 and Taxol regular dose today. -We will reevaluate him in 1 week.  2.  Paroxysmal atrial fibrillation: - He was on Xarelto 20 mg daily.  He was hospitalized with hemoptysis from 11/16/2018 through 11/19/2018. - We held his Xarelto at this time.  3.  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  He had a pacemaker in November 2018. - Plavix is on hold since hemoptysis.  He will continue aspirin 81 mg daily.

## 2018-11-28 NOTE — Patient Instructions (Addendum)
[place] at [facility] Discharge Instructions  You were seen today by Dr. Delton Coombes. He went over your recent lab results. He will see you back in 1 week for labs and follow up. Start drinking 2 boost or ensure a day, after your meal. Try taking colace daily to help with constipation. Thank you for choosing [place] at [facility] to provide your oncology and hematology care.  To afford each patient quality time with our provider, please arrive at least 15 minutes before your scheduled appointment time.   If you have a lab appointment with the [facility] please come in thru the  Main Entrance and check in at the main information desk  You need to re-schedule your appointment should you arrive 10 or more minutes late.  We strive to give you quality time with our providers, and arriving late affects you and other patients whose appointments are after yours.  Also, if you no show three or more times for appointments you may be dismissed from the clinic at the providers discretion.     Again, thank you for choosing [facility].  Our hope is that these requests will decrease the amount of time that you wait before being seen by our physicians.       _____________________________________________________________  Should you have questions after your visit to [facility], please contact our office at [phone] between the hours of 8:00 a.m. and 4:30 p.m.  Voicemails left after 4:00 p.m. will not be returned until the following business day.  For prescription refill requests, have your pharmacy contact our office and allow 72 hours.    Cancer Center Support Programs:   > Cancer Support Group  2nd Tuesday of the month 1pm-2pm, Journey Room

## 2018-11-29 DIAGNOSIS — C3432 Malignant neoplasm of lower lobe, left bronchus or lung: Secondary | ICD-10-CM | POA: Diagnosis not present

## 2018-11-29 DIAGNOSIS — Z510 Encounter for antineoplastic radiation therapy: Secondary | ICD-10-CM | POA: Diagnosis not present

## 2018-11-29 DIAGNOSIS — Z950 Presence of cardiac pacemaker: Secondary | ICD-10-CM | POA: Diagnosis not present

## 2018-11-30 DIAGNOSIS — Z510 Encounter for antineoplastic radiation therapy: Secondary | ICD-10-CM | POA: Diagnosis not present

## 2018-11-30 DIAGNOSIS — C3432 Malignant neoplasm of lower lobe, left bronchus or lung: Secondary | ICD-10-CM | POA: Diagnosis not present

## 2018-11-30 DIAGNOSIS — Z950 Presence of cardiac pacemaker: Secondary | ICD-10-CM | POA: Diagnosis not present

## 2018-12-01 ENCOUNTER — Ambulatory Visit: Payer: Medicare Other | Admitting: *Deleted

## 2018-12-01 DIAGNOSIS — C3432 Malignant neoplasm of lower lobe, left bronchus or lung: Secondary | ICD-10-CM | POA: Diagnosis not present

## 2018-12-01 DIAGNOSIS — Z950 Presence of cardiac pacemaker: Secondary | ICD-10-CM | POA: Diagnosis not present

## 2018-12-01 DIAGNOSIS — Z510 Encounter for antineoplastic radiation therapy: Secondary | ICD-10-CM | POA: Diagnosis not present

## 2018-12-02 DIAGNOSIS — C3432 Malignant neoplasm of lower lobe, left bronchus or lung: Secondary | ICD-10-CM | POA: Diagnosis not present

## 2018-12-02 DIAGNOSIS — Z950 Presence of cardiac pacemaker: Secondary | ICD-10-CM | POA: Diagnosis not present

## 2018-12-02 DIAGNOSIS — Z510 Encounter for antineoplastic radiation therapy: Secondary | ICD-10-CM | POA: Diagnosis not present

## 2018-12-02 LAB — NOVEL CORONAVIRUS, NAA: SARS-CoV-2, NAA: NOT DETECTED

## 2018-12-05 ENCOUNTER — Inpatient Hospital Stay (HOSPITAL_BASED_OUTPATIENT_CLINIC_OR_DEPARTMENT_OTHER): Payer: Medicare Other | Admitting: Hematology

## 2018-12-05 ENCOUNTER — Other Ambulatory Visit: Payer: Self-pay

## 2018-12-05 ENCOUNTER — Inpatient Hospital Stay (HOSPITAL_COMMUNITY): Payer: Medicare Other

## 2018-12-05 ENCOUNTER — Encounter (HOSPITAL_COMMUNITY): Payer: Self-pay | Admitting: Hematology

## 2018-12-05 ENCOUNTER — Inpatient Hospital Stay (HOSPITAL_COMMUNITY): Payer: Medicare Other | Attending: Hematology

## 2018-12-05 VITALS — BP 108/56 | HR 70 | Temp 97.6°F | Resp 18

## 2018-12-05 DIAGNOSIS — I480 Paroxysmal atrial fibrillation: Secondary | ICD-10-CM | POA: Insufficient documentation

## 2018-12-05 DIAGNOSIS — Z95828 Presence of other vascular implants and grafts: Secondary | ICD-10-CM

## 2018-12-05 DIAGNOSIS — C3432 Malignant neoplasm of lower lobe, left bronchus or lung: Secondary | ICD-10-CM | POA: Insufficient documentation

## 2018-12-05 DIAGNOSIS — C3492 Malignant neoplasm of unspecified part of left bronchus or lung: Secondary | ICD-10-CM

## 2018-12-05 DIAGNOSIS — Z5111 Encounter for antineoplastic chemotherapy: Secondary | ICD-10-CM | POA: Insufficient documentation

## 2018-12-05 DIAGNOSIS — Z510 Encounter for antineoplastic radiation therapy: Secondary | ICD-10-CM | POA: Diagnosis not present

## 2018-12-05 DIAGNOSIS — Z950 Presence of cardiac pacemaker: Secondary | ICD-10-CM | POA: Diagnosis not present

## 2018-12-05 DIAGNOSIS — D702 Other drug-induced agranulocytosis: Secondary | ICD-10-CM | POA: Insufficient documentation

## 2018-12-05 DIAGNOSIS — E876 Hypokalemia: Secondary | ICD-10-CM

## 2018-12-05 LAB — CBC WITH DIFFERENTIAL/PLATELET
Abs Immature Granulocytes: 0.02 10*3/uL (ref 0.00–0.07)
Basophils Absolute: 0 10*3/uL (ref 0.0–0.1)
Basophils Relative: 0 %
Eosinophils Absolute: 0 10*3/uL (ref 0.0–0.5)
Eosinophils Relative: 1 %
HCT: 26.7 % — ABNORMAL LOW (ref 39.0–52.0)
Hemoglobin: 9 g/dL — ABNORMAL LOW (ref 13.0–17.0)
Immature Granulocytes: 1 %
Lymphocytes Relative: 8 %
Lymphs Abs: 0.2 10*3/uL — ABNORMAL LOW (ref 0.7–4.0)
MCH: 32 pg (ref 26.0–34.0)
MCHC: 33.7 g/dL (ref 30.0–36.0)
MCV: 95 fL (ref 80.0–100.0)
Monocytes Absolute: 0.2 10*3/uL (ref 0.1–1.0)
Monocytes Relative: 8 %
Neutro Abs: 2.2 10*3/uL (ref 1.7–7.7)
Neutrophils Relative %: 82 %
Platelets: 147 10*3/uL — ABNORMAL LOW (ref 150–400)
RBC: 2.81 MIL/uL — ABNORMAL LOW (ref 4.22–5.81)
RDW: 14 % (ref 11.5–15.5)
WBC: 2.7 10*3/uL — ABNORMAL LOW (ref 4.0–10.5)
nRBC: 0 % (ref 0.0–0.2)

## 2018-12-05 LAB — COMPREHENSIVE METABOLIC PANEL
ALT: 15 U/L (ref 0–44)
AST: 16 U/L (ref 15–41)
Albumin: 3.7 g/dL (ref 3.5–5.0)
Alkaline Phosphatase: 49 U/L (ref 38–126)
Anion gap: 10 (ref 5–15)
BUN: 17 mg/dL (ref 8–23)
CO2: 24 mmol/L (ref 22–32)
Calcium: 8.9 mg/dL (ref 8.9–10.3)
Chloride: 94 mmol/L — ABNORMAL LOW (ref 98–111)
Creatinine, Ser: 0.94 mg/dL (ref 0.61–1.24)
GFR calc Af Amer: >60 mL/min (ref >60)
GFR calc non Af Amer: >60 mL/min (ref >60)
Glucose, Bld: 120 mg/dL — ABNORMAL HIGH (ref 70–99)
Potassium: 3.4 mmol/L — ABNORMAL LOW (ref 3.5–5.1)
Sodium: 128 mmol/L — ABNORMAL LOW (ref 135–145)
Total Bilirubin: 1.3 mg/dL — ABNORMAL HIGH (ref 0.3–1.2)
Total Protein: 7 g/dL (ref 6.5–8.1)

## 2018-12-05 MED ORDER — SODIUM CHLORIDE 0.9% FLUSH
10.0000 mL | INTRAVENOUS | Status: DC | PRN
  Administered 2018-12-05: 10 mL
  Filled 2018-12-05: qty 10

## 2018-12-05 MED ORDER — POTASSIUM CHLORIDE ER 10 MEQ PO TBCR
20.0000 meq | EXTENDED_RELEASE_TABLET | Freq: Once | ORAL | Status: AC
  Administered 2018-12-05: 20 meq via ORAL
  Filled 2018-12-05: qty 2

## 2018-12-05 MED ORDER — SODIUM CHLORIDE 0.9 % IV SOLN
Freq: Once | INTRAVENOUS | Status: AC
  Administered 2018-12-05: 11:00:00 via INTRAVENOUS

## 2018-12-05 MED ORDER — SODIUM CHLORIDE 0.9 % IV SOLN
20.0000 mg | Freq: Once | INTRAVENOUS | Status: AC
  Administered 2018-12-05: 11:00:00 20 mg via INTRAVENOUS
  Filled 2018-12-05: qty 20

## 2018-12-05 MED ORDER — FAMOTIDINE IN NACL 20-0.9 MG/50ML-% IV SOLN
20.0000 mg | Freq: Once | INTRAVENOUS | Status: AC
  Administered 2018-12-05: 20 mg via INTRAVENOUS
  Filled 2018-12-05: qty 50

## 2018-12-05 MED ORDER — HEPARIN SOD (PORK) LOCK FLUSH 100 UNIT/ML IV SOLN
500.0000 [IU] | Freq: Once | INTRAVENOUS | Status: AC | PRN
  Administered 2018-12-05: 500 [IU]

## 2018-12-05 MED ORDER — SODIUM CHLORIDE 0.9 % IV SOLN
45.0000 mg/m2 | Freq: Once | INTRAVENOUS | Status: AC
  Administered 2018-12-05: 12:00:00 96 mg via INTRAVENOUS
  Filled 2018-12-05: qty 16

## 2018-12-05 MED ORDER — DIPHENHYDRAMINE HCL 50 MG/ML IJ SOLN
50.0000 mg | Freq: Once | INTRAMUSCULAR | Status: AC
  Administered 2018-12-05: 50 mg via INTRAVENOUS
  Filled 2018-12-05: qty 1

## 2018-12-05 MED ORDER — SODIUM CHLORIDE 0.9 % IV SOLN
145.2000 mg | Freq: Once | INTRAVENOUS | Status: AC
  Administered 2018-12-05: 14:00:00 150 mg via INTRAVENOUS
  Filled 2018-12-05: qty 15

## 2018-12-05 MED ORDER — PALONOSETRON HCL INJECTION 0.25 MG/5ML
0.2500 mg | Freq: Once | INTRAVENOUS | Status: AC
  Administered 2018-12-05: 0.25 mg via INTRAVENOUS
  Filled 2018-12-05: qty 5

## 2018-12-05 NOTE — Progress Notes (Signed)
1000 Labs reviewed with and pt seen by Dr. Delton Coombes and pt approved for chemo tx today with added Neupogen injection for Friday per MD                                                       Nicholas Sanchez tolerated chemo tx well without complaints or incident. VSS upon discharge. Pt discharged via wheelchair in satisfactory condition

## 2018-12-05 NOTE — Progress Notes (Signed)
Nicholas Sanchez, [place] [address]   CLINIC:  Medical Oncology/Hematology  PCP:  Nicholas Sanchez, [place] [address] [phone]   REASON FOR VISIT:  Follow-up for lung cancer.  CURRENT THERAPY: Recommended combination chemoradiation therapy.   CANCER STAGING: Cancer Staging Squamous cell carcinoma of lung, left (HCC) Staging form: Lung, AJCC 8th Edition - Clinical stage from 11/09/2018: Stage IIIB (cT1c, cN3, cM0) - Unsigned    INTERVAL HISTORY:  Nicholas Sanchez 85 y.o. male seen for follow-up of lung cancer.  He was started on chemoradiation therapy on 11/21/2018.  Last treatment was on 11/28/2018.  Appetite and energy levels are 25%.  Has mild cough which is stable.  Constipation is also stable.  Occasional nausea is controlled with Compazine.  He is drinking ensures 3 cans/day.  He lost about 2 pounds since last week.  No fevers or night sweats were reported.  No hemoptysis since we held Xarelto.    REVIEW OF SYSTEMS:  Review of Systems  Constitutional: Positive for unexpected weight change.  Respiratory: Positive for cough.   Gastrointestinal: Positive for constipation and nausea.  Neurological: Positive for headaches.  Psychiatric/Behavioral: Positive for sleep disturbance.  All other systems reviewed and are negative.    PAST MEDICAL/SURGICAL HISTORY:  Past Medical History:  Diagnosis Date  . CAD (coronary artery disease)    a. STEMI 04/2008 s/p BMS to prox & distal RCA, staged DES to LAD same admission. b. Nuc 03/2017: scar but no ischemia, EF 45-54%. c. 09/2017: NSTEMI - DES x 2 to proximal and mid-RCA  . Cancer ([place])   . CKD (chronic kidney disease), stage II   . Cough, persistent   . GERD (gastroesophageal reflux disease)   . History of BPH   . HTN (hypertension)   . Hypercholesteremia   . LV dysfunction    a. EF 45-50% in 01/2016.  . Mild pulmonary hypertension ([place])   . PAF (paroxysmal atrial  fibrillation) ([place])   . Port-A-Cath in place 11/21/2018  . Presence of permanent cardiac pacemaker   . Sinus node dysfunction ([place])    a. s/p StJude CRT-pacemaker 01/2016.   Past Surgical History:  Procedure Laterality Date  . CARDIAC CATHETERIZATION    . COLONOSCOPY    . COLONOSCOPY N/A 08/27/2017   Dr. [name]: 25 5-25 mm polyps in descending colon, ascending, cecum. 2X3 cm carpet polyp in base of cecum lifted away s/p piecemeal polypectomy. Tubulovillous adenoma and tubular adenomas  . CORONARY STENT INTERVENTION N/A 10/22/2017   Procedure: CORONARY STENT INTERVENTION;  Surgeon: [name], MD;  Location: [facility];  Service: Cardiovascular;  Laterality: N/A;  . CORONARY STENT PLACEMENT  04/2008   RCA and LAD  . EP IMPLANTABLE DEVICE N/A 01/09/2016   Procedure: BiV Pacemaker Insertion CRT-P;  Surgeon: [name], MD;  Location: [facility];  Service: Cardiovascular;  Laterality: N/A;  . ESOPHAGOGASTRODUODENOSCOPY N/A 08/27/2017   normal esophagus s/p dilation, gastric petechia, small hiatal hernia, normal duodenum  . INSERT / REPLACE / REMOVE PACEMAKER    . MALONEY DILATION N/A 08/27/2017   Procedure: [name] DILATION;  Surgeon: [name], MD;  [facility];  Service: Endoscopy;  Laterality: N/A;  . PORTACATH PLACEMENT Right 11/18/2018   Procedure: INSERTION PORT-A-CATH;  Surgeon: [name], MD;  Location: [facility];  Service: General;  Laterality: Right;  . RIGHT/LEFT HEART CATH AND CORONARY ANGIOGRAPHY N/A 10/21/2017   Procedure: RIGHT/LEFT HEART CATH  AND CORONARY ANGIOGRAPHY;  Surgeon: [name], MD;  Location: [facility];  Service: Cardiovascular;  Laterality: N/A;     SOCIAL HISTORY:  Social History   Socioeconomic History  . Marital status: Married    Spouse name: Air traffic controller  . Number of children: 3  . Years of education: Not on file  . Highest education level: Not on file  Occupational History  . Occupation: retired     Comment: Geographical information systems officer  . Financial resource strain: Not very hard  . Food insecurity    Worry: Never true    Inability: Never true  . Transportation needs    Medical: No    Non-medical: No  Tobacco Use  . Smoking status: Former Smoker    Packs/day: 2.50    Years: 25.00    Pack years: 62.50    Types: Cigarettes    Quit date: 06/14/1973    Years since quitting: 45.5  . Smokeless tobacco: Former Systems developer    Types: Chew    Quit date: 06/14/1973  . Tobacco comment: some/chewed very little for about 2 years  Substance and Sexual Activity  . Alcohol use: No  . Drug use: No  . Sexual activity: Not on file  Lifestyle  . Physical activity    Days per week: 0 days    Minutes per session: 0 min  . Stress: Not at all  Relationships  . Social connections    Talks on phone: More than three times a week    Gets together: More than three times a week    Attends religious service: More than 4 times per year    Active member of club or organization: No    Attends meetings of clubs or organizations: Never    Relationship status: Married  . Intimate partner violence    Fear of current or ex partner: No    Emotionally abused: No    Physically abused: No    Forced sexual activity: No  Other Topics Concern  . 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     FAMILY HISTORY:  Family History  Problem Relation Age of Onset  . Emphysema Mother        mother died with emphysema and cancer  . Cancer Mother   . Prostate cancer Brother   . Heart disease Brother   . Hypothyroidism Daughter   . Hypothyroidism Daughter   . Colon cancer Neg Hx   . Colon polyps Neg Hx     CURRENT MEDICATIONS:  Outpatient Encounter Medications as of 12/05/2018  Medication Sig Note  . aspirin EC 81 MG tablet Take 81 mg by mouth daily.   [name] CARBOPLATIN IV Inject into the vein once a week.   . cholecalciferol (VITAMIN D) 1000 units tablet Take 1,000 Units by mouth 2  (two) times daily.   . dorzolamide-timolol (COSOPT) 22.3-6.8 MG/ML ophthalmic solution Place 1 drop into the left eye 2 (two) times daily.   . ferrous sulfate 325 (65 FE) MG tablet Take 325 mg by mouth daily.    . hydrochlorothiazide (HYDRODIURIL) 25 MG tablet TAKE ONE (1) TABLET BY MOUTH EVERY DAY (Patient taking differently: Take 25 mg by mouth daily. )   . losartan (COZAAR) 25 MG tablet TAKE ONE TABLET BY MOUTY EVERY DAY (Patient taking differently: Take 25 mg by mouth daily. )   . lovastatin (MEVACOR) 10 MG tablet TAKE ONE (1) TABLET BY MOUTH  EVERY DAY   . metoprolol succinate (TOPROL XL) 25 MG 24 hr tablet Take 1 tablet (25 mg total) by mouth daily.   [name] PACLITAXEL IV Inject into the vein once a week.   . pantoprazole (PROTONIX) 40 MG tablet Take 40 mg by mouth daily.   . tamsulosin (FLOMAX) 0.4 MG CAPS capsule Take 0.4 mg by mouth daily.   . vitamin B-12 (CYANOCOBALAMIN) 1000 MCG tablet Take 1,000 mcg by mouth daily.   [name] acetaminophen (TYLENOL) 500 MG tablet Take 500 mg by mouth every 6 (six) hours as needed for pain.   [name] lidocaine-prilocaine (EMLA) cream Apply a dime-sized amount to port a cath site and cover with plastic wrap one hour prior to chemotherapy appointments (Patient not taking: Reported on 12/05/2018)   . ondansetron (ZOFRAN ODT) 8 MG disintegrating tablet Take 1 tablet (8 mg total) by mouth 2 (two) times daily as needed for nausea or vomiting. (Patient not taking: Reported on 11/28/2018)   . prochlorperazine (COMPAZINE) 10 MG tablet Take 1 tablet (10 mg total) by mouth every 6 (six) hours as needed for nausea or vomiting. (Patient not taking: Reported on 11/28/2018)   . Propylene Glycol (SYSTANE BALANCE OP) Place 1 drop into both eyes 2 (two) times daily as needed (dry eyes).    [name] scopolamine (TRANSDERM-SCOP) 1 MG/3DAYS Place 1 patch (1.5 mg total) onto the skin every 3 (three) days. (Patient not taking: Reported on 11/28/2018) 11/28/2018: Pt hasn't started yet  . triamcinolone cream  (KENALOG) 0.1 % Apply 1 application topically daily as needed (itching).     No facility-administered encounter medications on file as of 12/05/2018.     ALLERGIES:  Allergies  Allergen Reactions  . Ace Inhibitors Nausea Only  . Tape Rash    And Bandaids, too     PHYSICAL EXAM:  ECOG Performance status: 1  Vitals:   12/05/18 0918  BP: 128/73  Pulse: 61  Resp: 20  Temp: (!) 97.3 F (36.3 C)  SpO2: (!) 89%   Filed Weights   12/05/18 0918  Weight: 190 lb 1.6 oz (86.2 kg)    Physical Exam Vitals signs reviewed.  Constitutional:      Appearance: Normal appearance.  Cardiovascular:     Rate and Rhythm: Normal rate and regular rhythm.     Heart sounds: Normal heart sounds.  Pulmonary:     Effort: Pulmonary effort is normal.     Breath sounds: Normal breath sounds.  Abdominal:     General: There is no distension.     Palpations: Abdomen is soft. There is no mass.  Musculoskeletal:        General: No swelling.  Skin:    General: Skin is warm.  Neurological:     General: No focal deficit present.     Mental Status: He is alert and oriented to person, place, and time.  Psychiatric:        Mood and Affect: Mood normal.        Behavior: Behavior normal.      LABORATORY DATA:  I have reviewed the labs as listed.  CBC    Component Value Date/Time   WBC 2.4 (L) 12/09/2018 0900   RBC 2.98 (L) 12/09/2018 0900   HGB 9.2 (L) 12/09/2018 0900   HCT 28.9 (L) 12/09/2018 0900   PLT 146 (L) 12/09/2018 0900   MCV 97.0 12/09/2018 0900   MCH 30.9 12/09/2018 0900   MCHC 31.8 12/09/2018 0900   RDW 14.5 12/09/2018 0900  LYMPHSABS 0.1 (L) 12/09/2018 0900   MONOABS 0.1 12/09/2018 0900   EOSABS 0.0 12/09/2018 0900   BASOSABS 0.0 12/09/2018 0900   CMP Latest Ref Rng & Units 12/05/2018 11/28/2018 11/21/2018  Glucose 70 - 99 mg/dL 120(H) 133(H) 108(H)  BUN 8 - 23 mg/dL 17 17 8   Creatinine 0.61 - 1.24 mg/dL 0.94 0.88 1.03  Sodium 135 - 145 mmol/L 128(L) 125(L) 128(L)   Potassium 3.5 - 5.1 mmol/L 3.4(L) 3.5 3.7  Chloride 98 - 111 mmol/L 94(L) 90(L) 93(L)  CO2 22 - 32 mmol/L 24 25 27   Calcium 8.9 - 10.3 mg/dL 8.9 8.7(L) 9.1  Total Protein 6.5 - 8.1 g/dL 7.0 7.0 7.4  Total Bilirubin 0.3 - 1.2 mg/dL 1.3(H) 1.3(H) 1.2  Alkaline Phos 38 - 126 U/L 49 51 58  AST 15 - 41 U/L 16 15 21   ALT 0 - 44 U/L 15 13 11        DIAGNOSTIC IMAGING:  I have independently reviewed the scans and discussed with the patient.    ASSESSMENT & PLAN:   Squamous cell carcinoma of lung, left ([place]) 1.  [name] squamous cell carcinoma left lung: -PET scan on 10/17/2018 showed hypermetabolic left lower lobe nodule, left hilar and contralateral right lower paratracheal lymph node along with right hilar lymph nodes.  Hypermetabolic subcarinal lymph nodes present.  Hypermetabolic lymph node in the porta hepatis with SUV 4.9, measuring 1.1 cm, favoring reactive adenopathy. -26 pound weight loss in the last 1 year, ex-smoker quit in 1975. -Left lower lobe lung biopsy on 10/31/2018 consistent with squamous cell carcinoma. - Weekly carbotaxol started on 11/21/2018 and radiation on 11/22/2018. - Carboplatin dose reduced to 1.5 AUC on 11/28/2018. - We reviewed his blood work.  White count is low at 2.7 but ANC is 2200.  He will proceed with chemotherapy at the same dose of carboplatin AUC 1.5.  Paclitaxel will be 45 per metered square. - I plan to repeat his CBC on Friday.  We will give Neupogen if [place] below 1500. -He will continue XRT until 12/28/2018.  2.  Paroxysmal atrial fibrillation: - He was hospitalized for hemoptysis from 11/16/2026 through 11/19/2018. -We are holding Xarelto at this time.  No further hemoptysis noted.  3.  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  He had a pacemaker in November 2018. - Plavix is on hold since hemoptysis.  He will continue aspirin 81 mg daily.  4.  Weight loss: -He lost about 2 pounds since last visit.  He is drinking Ensure 3 cans/day. -We will  closely watch his weight.  Total time spent is 25 minutes with more than 50% of the time spent face-to-face discussing treatment plan, counseling and coordination of care.  Orders placed this encounter:  No orders of the defined types were placed in this encounter.     Derek Jack, MD [place] [phone]

## 2018-12-05 NOTE — Progress Notes (Signed)
12/05/18  K= 3.4 today  Give 20 meq Potassium chloride orally x 1 dose today.  T.O. Dr Beckey Downing LPN/Ludella Pranger Ronnald Ramp, PharmD

## 2018-12-05 NOTE — Patient Instructions (Addendum)
[place] at [facility] Discharge Instructions  You were seen today by Dr. Delton Coombes. He went over your recent lab results. He will schedule you to come back Friday for a Neupogen injection to help boost your white blood cells. He will see you back in 1 week for labs, treatment and follow up.   Thank you for choosing [place] at [facility] to provide your oncology and hematology care.  To afford each patient quality time with our provider, please arrive at least 15 minutes before your scheduled appointment time.   If you have a lab appointment with the [facility] please come in thru the  Main Entrance and check in at the main information desk  You need to re-schedule your appointment should you arrive 10 or more minutes late.  We strive to give you quality time with our providers, and arriving late affects you and other patients whose appointments are after yours.  Also, if you no show three or more times for appointments you may be dismissed from the clinic at the providers discretion.     Again, thank you for choosing [facility].  Our hope is that these requests will decrease the amount of time that you wait before being seen by our physicians.       _____________________________________________________________  Should you have questions after your visit to [facility], please contact our office at [phone] between the hours of 8:00 a.m. and 4:30 p.m.  Voicemails left after 4:00 p.m. will not be returned until the following business day.  For prescription refill requests, have your pharmacy contact our office and allow 72 hours.    Cancer Center Support Programs:   > Cancer Support Group  2nd Tuesday of the month 1pm-2pm, Journey Room

## 2018-12-05 NOTE — Patient Instructions (Signed)
[facility] Discharge Instructions for Patients Receiving Chemotherapy   Beginning January 23rd 2017 lab work for the Cancer Center will be done in the  Main lab at [facility] on 1st floor. If you have a lab appointment with the Cancer Center please come in thru the  Main Entrance and check in at the main information desk   Today you received the following chemotherapy agents Taxol and Carboplatin. Follow-up as scheduled. Call clinic for any questions or concerns  To help prevent nausea and vomiting after your treatment, we encourage you to take your nausea medication.   If you develop nausea and vomiting, or diarrhea that is not controlled by your medication, call the clinic.  The clinic phone number is [phone]. Office hours are Monday-Friday 8:30am-5:00pm.  BELOW ARE SYMPTOMS THAT SHOULD BE REPORTED IMMEDIATELY:  *FEVER GREATER THAN 101.0 F  *CHILLS WITH OR WITHOUT FEVER  NAUSEA AND VOMITING THAT IS NOT CONTROLLED WITH YOUR NAUSEA MEDICATION  *UNUSUAL SHORTNESS OF BREATH  *UNUSUAL BRUISING OR BLEEDING  TENDERNESS IN MOUTH AND THROAT WITH OR WITHOUT PRESENCE OF ULCERS  *URINARY PROBLEMS  *BOWEL PROBLEMS  UNUSUAL RASH Items with * indicate a potential emergency and should be followed up as soon as possible. If you have an emergency after office hours please contact your primary care physician or go to the nearest emergency department.  Please call the clinic during office hours if you have any questions or concerns.   You may also contact the Patient Navigator at [phone] should you have any questions or need assistance in obtaining follow up care.      Resources For Cancer Patients and their Caregivers ? American Cancer Society: Can assist with transportation, wigs, general needs, runs Look Good Feel Better.        1-888-227-6333 ? Cancer Care: Provides financial assistance, online support groups, medication/co-pay assistance.   1-800-813-HOPE (4673) ? [facility] Assists [place] cancer patients and their families through emotional , educational and financial support.  336-427-4357 ? [place] DSS Where to apply for food stamps, Medicaid and utility assistance. 336-342-1394 ? [facility]: Transportation to medical appointments. 336-347-2287 ? Social Security Administration: May apply for disability if have a Stage IV cancer. 336-342-7796 1-800-772-1213 ? [place] Aging, Disability and Transit Services: Assists with nutrition, care and transit needs. 336-349-2343         

## 2018-12-06 DIAGNOSIS — Z950 Presence of cardiac pacemaker: Secondary | ICD-10-CM | POA: Diagnosis not present

## 2018-12-06 DIAGNOSIS — C3432 Malignant neoplasm of lower lobe, left bronchus or lung: Secondary | ICD-10-CM | POA: Diagnosis not present

## 2018-12-06 DIAGNOSIS — Z510 Encounter for antineoplastic radiation therapy: Secondary | ICD-10-CM | POA: Diagnosis not present

## 2018-12-07 DIAGNOSIS — C3432 Malignant neoplasm of lower lobe, left bronchus or lung: Secondary | ICD-10-CM | POA: Diagnosis not present

## 2018-12-07 DIAGNOSIS — Z950 Presence of cardiac pacemaker: Secondary | ICD-10-CM | POA: Diagnosis not present

## 2018-12-07 DIAGNOSIS — Z510 Encounter for antineoplastic radiation therapy: Secondary | ICD-10-CM | POA: Diagnosis not present

## 2018-12-07 NOTE — Progress Notes (Signed)
Error / report no received

## 2018-12-08 DIAGNOSIS — C3432 Malignant neoplasm of lower lobe, left bronchus or lung: Secondary | ICD-10-CM | POA: Diagnosis not present

## 2018-12-08 DIAGNOSIS — Z510 Encounter for antineoplastic radiation therapy: Secondary | ICD-10-CM | POA: Diagnosis not present

## 2018-12-08 DIAGNOSIS — Z950 Presence of cardiac pacemaker: Secondary | ICD-10-CM | POA: Diagnosis not present

## 2018-12-09 ENCOUNTER — Encounter (HOSPITAL_COMMUNITY): Payer: Self-pay

## 2018-12-09 ENCOUNTER — Other Ambulatory Visit: Payer: Self-pay

## 2018-12-09 ENCOUNTER — Inpatient Hospital Stay (HOSPITAL_COMMUNITY): Payer: Medicare Other

## 2018-12-09 ENCOUNTER — Ambulatory Visit (HOSPITAL_COMMUNITY): Payer: Medicare Other

## 2018-12-09 DIAGNOSIS — C3432 Malignant neoplasm of lower lobe, left bronchus or lung: Secondary | ICD-10-CM | POA: Diagnosis not present

## 2018-12-09 DIAGNOSIS — Z510 Encounter for antineoplastic radiation therapy: Secondary | ICD-10-CM | POA: Diagnosis not present

## 2018-12-09 DIAGNOSIS — Z950 Presence of cardiac pacemaker: Secondary | ICD-10-CM | POA: Diagnosis not present

## 2018-12-09 DIAGNOSIS — I480 Paroxysmal atrial fibrillation: Secondary | ICD-10-CM | POA: Diagnosis not present

## 2018-12-09 DIAGNOSIS — D702 Other drug-induced agranulocytosis: Secondary | ICD-10-CM

## 2018-12-09 DIAGNOSIS — Z5111 Encounter for antineoplastic chemotherapy: Secondary | ICD-10-CM | POA: Diagnosis not present

## 2018-12-09 DIAGNOSIS — C3492 Malignant neoplasm of unspecified part of left bronchus or lung: Secondary | ICD-10-CM

## 2018-12-09 LAB — CBC WITH DIFFERENTIAL/PLATELET
Abs Immature Granulocytes: 0.01 10*3/uL (ref 0.00–0.07)
Basophils Absolute: 0 10*3/uL (ref 0.0–0.1)
Basophils Relative: 0 %
Eosinophils Absolute: 0 10*3/uL (ref 0.0–0.5)
Eosinophils Relative: 0 %
HCT: 28.9 % — ABNORMAL LOW (ref 39.0–52.0)
Hemoglobin: 9.2 g/dL — ABNORMAL LOW (ref 13.0–17.0)
Immature Granulocytes: 0 %
Lymphocytes Relative: 5 %
Lymphs Abs: 0.1 10*3/uL — ABNORMAL LOW (ref 0.7–4.0)
MCH: 30.9 pg (ref 26.0–34.0)
MCHC: 31.8 g/dL (ref 30.0–36.0)
MCV: 97 fL (ref 80.0–100.0)
Monocytes Absolute: 0.1 10*3/uL (ref 0.1–1.0)
Monocytes Relative: 5 %
Neutro Abs: 2.1 10*3/uL (ref 1.7–7.7)
Neutrophils Relative %: 90 %
Platelets: 146 10*3/uL — ABNORMAL LOW (ref 150–400)
RBC: 2.98 MIL/uL — ABNORMAL LOW (ref 4.22–5.81)
RDW: 14.5 % (ref 11.5–15.5)
WBC: 2.4 10*3/uL — ABNORMAL LOW (ref 4.0–10.5)
nRBC: 0 % (ref 0.0–0.2)

## 2018-12-09 NOTE — Progress Notes (Signed)
Venipuncture performed with a 23 gauge butterfly needle to left posterior forearm.  Nicholas Sanchez tolerated procedure well and without incident.   [name] 2100 today - pt did not receive Neupogen. Discharged in c/o daughter via wheelchair.

## 2018-12-11 NOTE — Assessment & Plan Note (Addendum)
1.  [name] squamous cell carcinoma left lung: -PET scan on 10/17/2018 showed hypermetabolic left lower lobe nodule, left hilar and contralateral right lower paratracheal lymph node along with right hilar lymph nodes.  Hypermetabolic subcarinal lymph nodes present.  Hypermetabolic lymph node in the porta hepatis with SUV 4.9, measuring 1.1 cm, favoring reactive adenopathy. -26 pound weight loss in the last 1 year, ex-smoker quit in 1975. -Left lower lobe lung biopsy on 10/31/2018 consistent with squamous cell carcinoma. - Weekly carbotaxol started on 11/21/2018 and radiation on 11/22/2018. - Carboplatin dose reduced to 1.5 AUC on 11/28/2018. - We reviewed his blood work.  White count is low at 2.7 but ANC is 2200.  He will proceed with chemotherapy at the same dose of carboplatin AUC 1.5.  Paclitaxel will be 45 per metered square. - I plan to repeat his CBC on Friday.  We will give Neupogen if [place] below 1500. -He will continue XRT until 12/28/2018.  2.  Paroxysmal atrial fibrillation: - He was hospitalized for hemoptysis from 11/16/2026 through 11/19/2018. -We are holding Xarelto at this time.  No further hemoptysis noted.  3.  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  He had a pacemaker in November 2018. - Plavix is on hold since hemoptysis.  He will continue aspirin 81 mg daily.  4.  Weight loss: -He lost about 2 pounds since last visit.  He is drinking Ensure 3 cans/day. -We will closely watch his weight.

## 2018-12-12 ENCOUNTER — Ambulatory Visit (HOSPITAL_COMMUNITY): Payer: Medicare Other | Admitting: Hematology

## 2018-12-12 ENCOUNTER — Encounter (HOSPITAL_COMMUNITY): Payer: Self-pay | Admitting: Emergency Medicine

## 2018-12-12 ENCOUNTER — Emergency Department (HOSPITAL_COMMUNITY): Payer: Medicare Other

## 2018-12-12 ENCOUNTER — Ambulatory Visit (HOSPITAL_COMMUNITY): Payer: Medicare Other

## 2018-12-12 ENCOUNTER — Other Ambulatory Visit (HOSPITAL_COMMUNITY): Payer: Medicare Other

## 2018-12-12 ENCOUNTER — Inpatient Hospital Stay (HOSPITAL_COMMUNITY)
Admission: EM | Admit: 2018-12-12 | Discharge: 2018-12-26 | DRG: 368 | Disposition: A | Discharge status: Skilled Nursing Facility | Payer: Medicare Other | Source: Ambulatory Visit | Attending: Pulmonary Disease | Admitting: Pulmonary Disease

## 2018-12-12 ENCOUNTER — Other Ambulatory Visit: Payer: Self-pay

## 2018-12-12 DIAGNOSIS — D61810 Antineoplastic chemotherapy induced pancytopenia: Secondary | ICD-10-CM | POA: Diagnosis present

## 2018-12-12 DIAGNOSIS — E876 Hypokalemia: Secondary | ICD-10-CM | POA: Diagnosis not present

## 2018-12-12 DIAGNOSIS — Z8042 Family history of malignant neoplasm of prostate: Secondary | ICD-10-CM

## 2018-12-12 DIAGNOSIS — J449 Chronic obstructive pulmonary disease, unspecified: Secondary | ICD-10-CM | POA: Diagnosis present

## 2018-12-12 DIAGNOSIS — C3492 Malignant neoplasm of unspecified part of left bronchus or lung: Secondary | ICD-10-CM | POA: Diagnosis not present

## 2018-12-12 DIAGNOSIS — Z8249 Family history of ischemic heart disease and other diseases of the circulatory system: Secondary | ICD-10-CM

## 2018-12-12 DIAGNOSIS — Y842 Radiological procedure and radiotherapy as the cause of abnormal reaction of the patient, or of later complication, without mention of misadventure at the time of the procedure: Secondary | ICD-10-CM | POA: Diagnosis present

## 2018-12-12 DIAGNOSIS — Z7982 Long term (current) use of aspirin: Secondary | ICD-10-CM

## 2018-12-12 DIAGNOSIS — Z825 Family history of asthma and other chronic lower respiratory diseases: Secondary | ICD-10-CM

## 2018-12-12 DIAGNOSIS — I2510 Atherosclerotic heart disease of native coronary artery without angina pectoris: Secondary | ICD-10-CM | POA: Diagnosis not present

## 2018-12-12 DIAGNOSIS — K208 Other esophagitis without bleeding: Secondary | ICD-10-CM

## 2018-12-12 DIAGNOSIS — R1310 Dysphagia, unspecified: Secondary | ICD-10-CM

## 2018-12-12 DIAGNOSIS — B3781 Candidal esophagitis: Secondary | ICD-10-CM | POA: Diagnosis not present

## 2018-12-12 DIAGNOSIS — I129 Hypertensive chronic kidney disease with stage 1 through stage 4 chronic kidney disease, or unspecified chronic kidney disease: Secondary | ICD-10-CM | POA: Diagnosis present

## 2018-12-12 DIAGNOSIS — C3490 Malignant neoplasm of unspecified part of unspecified bronchus or lung: Secondary | ICD-10-CM

## 2018-12-12 DIAGNOSIS — N179 Acute kidney failure, unspecified: Secondary | ICD-10-CM

## 2018-12-12 DIAGNOSIS — I495 Sick sinus syndrome: Secondary | ICD-10-CM | POA: Diagnosis present

## 2018-12-12 DIAGNOSIS — T451X5A Adverse effect of antineoplastic and immunosuppressive drugs, initial encounter: Secondary | ICD-10-CM | POA: Diagnosis present

## 2018-12-12 DIAGNOSIS — I252 Old myocardial infarction: Secondary | ICD-10-CM

## 2018-12-12 DIAGNOSIS — Z8719 Personal history of other diseases of the digestive system: Secondary | ICD-10-CM | POA: Diagnosis not present

## 2018-12-12 DIAGNOSIS — K449 Diaphragmatic hernia without obstruction or gangrene: Secondary | ICD-10-CM | POA: Diagnosis present

## 2018-12-12 DIAGNOSIS — Z955 Presence of coronary angioplasty implant and graft: Secondary | ICD-10-CM | POA: Diagnosis not present

## 2018-12-12 DIAGNOSIS — Z950 Presence of cardiac pacemaker: Secondary | ICD-10-CM

## 2018-12-12 DIAGNOSIS — I480 Paroxysmal atrial fibrillation: Secondary | ICD-10-CM | POA: Diagnosis not present

## 2018-12-12 DIAGNOSIS — N182 Chronic kidney disease, stage 2 (mild): Secondary | ICD-10-CM | POA: Diagnosis not present

## 2018-12-12 DIAGNOSIS — R531 Weakness: Secondary | ICD-10-CM | POA: Diagnosis not present

## 2018-12-12 DIAGNOSIS — K2081 Other esophagitis with bleeding: Principal | ICD-10-CM | POA: Diagnosis present

## 2018-12-12 DIAGNOSIS — E860 Dehydration: Secondary | ICD-10-CM | POA: Diagnosis not present

## 2018-12-12 DIAGNOSIS — Z87891 Personal history of nicotine dependence: Secondary | ICD-10-CM

## 2018-12-12 DIAGNOSIS — E871 Hypo-osmolality and hyponatremia: Secondary | ICD-10-CM | POA: Diagnosis present

## 2018-12-12 DIAGNOSIS — T66XXXA Radiation sickness, unspecified, initial encounter: Secondary | ICD-10-CM | POA: Diagnosis not present

## 2018-12-12 DIAGNOSIS — N5089 Other specified disorders of the male genital organs: Secondary | ICD-10-CM | POA: Diagnosis present

## 2018-12-12 DIAGNOSIS — D7589 Other specified diseases of blood and blood-forming organs: Secondary | ICD-10-CM | POA: Diagnosis present

## 2018-12-12 DIAGNOSIS — Z20828 Contact with and (suspected) exposure to other viral communicable diseases: Secondary | ICD-10-CM | POA: Diagnosis present

## 2018-12-12 DIAGNOSIS — R05 Cough: Secondary | ICD-10-CM | POA: Diagnosis not present

## 2018-12-12 DIAGNOSIS — C3432 Malignant neoplasm of lower lobe, left bronchus or lung: Secondary | ICD-10-CM | POA: Diagnosis not present

## 2018-12-12 DIAGNOSIS — Z510 Encounter for antineoplastic radiation therapy: Secondary | ICD-10-CM | POA: Diagnosis not present

## 2018-12-12 DIAGNOSIS — Z7902 Long term (current) use of antithrombotics/antiplatelets: Secondary | ICD-10-CM

## 2018-12-12 LAB — URINALYSIS, ROUTINE W REFLEX MICROSCOPIC
Bacteria, UA: NONE SEEN
Bilirubin Urine: NEGATIVE
Glucose, UA: NEGATIVE mg/dL
Hgb urine dipstick: NEGATIVE
Ketones, ur: NEGATIVE mg/dL
Leukocytes,Ua: NEGATIVE
Nitrite: NEGATIVE
Protein, ur: 30 mg/dL — AB
Specific Gravity, Urine: 1.023 (ref 1.005–1.030)
pH: 6 (ref 5.0–8.0)

## 2018-12-12 LAB — COMPREHENSIVE METABOLIC PANEL
ALT: 35 U/L (ref 0–44)
AST: 24 U/L (ref 15–41)
Albumin: 3.9 g/dL (ref 3.5–5.0)
Alkaline Phosphatase: 49 U/L (ref 38–126)
Anion gap: 14 (ref 5–15)
BUN: 47 mg/dL — ABNORMAL HIGH (ref 8–23)
CO2: 23 mmol/L (ref 22–32)
Calcium: 8.9 mg/dL (ref 8.9–10.3)
Chloride: 96 mmol/L — ABNORMAL LOW (ref 98–111)
Creatinine, Ser: 1.82 mg/dL — ABNORMAL HIGH (ref 0.61–1.24)
GFR calc Af Amer: 39 mL/min — ABNORMAL LOW (ref >60)
GFR calc non Af Amer: 34 mL/min — ABNORMAL LOW (ref >60)
Glucose, Bld: 116 mg/dL — ABNORMAL HIGH (ref 70–99)
Potassium: 3.1 mmol/L — ABNORMAL LOW (ref 3.5–5.1)
Sodium: 133 mmol/L — ABNORMAL LOW (ref 135–145)
Total Bilirubin: 2.6 mg/dL — ABNORMAL HIGH (ref 0.3–1.2)
Total Protein: 6.7 g/dL (ref 6.5–8.1)

## 2018-12-12 LAB — CBC WITH DIFFERENTIAL/PLATELET
Abs Immature Granulocytes: 0.01 10*3/uL (ref 0.00–0.07)
Basophils Absolute: 0 10*3/uL (ref 0.0–0.1)
Basophils Relative: 0 %
Eosinophils Absolute: 0 10*3/uL (ref 0.0–0.5)
Eosinophils Relative: 0 %
HCT: 23.8 % — ABNORMAL LOW (ref 39.0–52.0)
Hemoglobin: 7.8 g/dL — ABNORMAL LOW (ref 13.0–17.0)
Immature Granulocytes: 0 %
Lymphocytes Relative: 4 %
Lymphs Abs: 0.1 10*3/uL — ABNORMAL LOW (ref 0.7–4.0)
MCH: 31.7 pg (ref 26.0–34.0)
MCHC: 32.8 g/dL (ref 30.0–36.0)
MCV: 96.7 fL (ref 80.0–100.0)
Monocytes Absolute: 0.3 10*3/uL (ref 0.1–1.0)
Monocytes Relative: 13 %
Neutro Abs: 2.1 10*3/uL (ref 1.7–7.7)
Neutrophils Relative %: 83 %
Platelets: 134 10*3/uL — ABNORMAL LOW (ref 150–400)
RBC: 2.46 MIL/uL — ABNORMAL LOW (ref 4.22–5.81)
RDW: 14.6 % (ref 11.5–15.5)
WBC: 2.6 10*3/uL — ABNORMAL LOW (ref 4.0–10.5)
nRBC: 1.6 % — ABNORMAL HIGH (ref 0.0–0.2)

## 2018-12-12 LAB — TROPONIN I (HIGH SENSITIVITY): Troponin I (High Sensitivity): 43 ng/L — ABNORMAL HIGH (ref <18)

## 2018-12-12 LAB — MAGNESIUM: Magnesium: 1.7 mg/dL (ref 1.7–2.4)

## 2018-12-12 LAB — SARS CORONAVIRUS 2 (TAT 6-24 HRS): SARS Coronavirus 2: NEGATIVE

## 2018-12-12 MED ORDER — OXYCODONE-ACETAMINOPHEN 5-325 MG PO TABS
1.0000 | ORAL_TABLET | Freq: Three times a day (TID) | ORAL | Status: DC
  Administered 2018-12-13 – 2018-12-18 (×18): 1 via ORAL
  Filled 2018-12-12 (×21): qty 1

## 2018-12-12 MED ORDER — VITAMIN D 25 MCG (1000 UNIT) PO TABS
1000.0000 [IU] | ORAL_TABLET | Freq: Two times a day (BID) | ORAL | Status: DC
  Administered 2018-12-13 – 2018-12-26 (×27): 1000 [IU] via ORAL
  Filled 2018-12-12 (×28): qty 1

## 2018-12-12 MED ORDER — MAGIC MOUTHWASH: 10.0000 mL | Freq: Three times a day (TID) | ORAL | Status: DC

## 2018-12-12 MED ORDER — DORZOLAMIDE HCL-TIMOLOL MAL 2-0.5 % OP SOLN
1.0000 [drp] | Freq: Two times a day (BID) | OPHTHALMIC | Status: DC
  Administered 2018-12-12 – 2018-12-26 (×25): 1 [drp] via OPHTHALMIC
  Filled 2018-12-12 (×6): qty 10

## 2018-12-12 MED ORDER — MAGIC MOUTHWASH W/LIDOCAINE
10.0000 mL | Freq: Three times a day (TID) | ORAL | Status: DC
  Filled 2018-12-12 (×6): qty 10

## 2018-12-12 MED ORDER — TAMSULOSIN HCL 0.4 MG PO CAPS
0.4000 mg | ORAL_CAPSULE | Freq: Every day | ORAL | Status: DC
  Administered 2018-12-13 – 2018-12-25 (×13): 0.4 mg via ORAL
  Filled 2018-12-12 (×14): qty 1

## 2018-12-12 MED ORDER — ENSURE ENLIVE PO LIQD: 237.0000 mL | Freq: Two times a day (BID) | ORAL | Status: DC

## 2018-12-12 MED ORDER — ACETAMINOPHEN 500 MG PO TABS
500.0000 mg | ORAL_TABLET | Freq: Four times a day (QID) | ORAL | Status: DC | PRN
  Administered 2018-12-15 – 2018-12-19 (×4): 500 mg via ORAL
  Filled 2018-12-12 (×4): qty 1

## 2018-12-12 MED ORDER — METOPROLOL SUCCINATE ER 25 MG PO TB24
25.0000 mg | ORAL_TABLET | Freq: Every day | ORAL | Status: DC
  Administered 2018-12-13 – 2018-12-19 (×5): 25 mg via ORAL
  Filled 2018-12-12 (×8): qty 1

## 2018-12-12 MED ORDER — SODIUM CHLORIDE 0.9 % IV BOLUS
1000.0000 mL | Freq: Once | INTRAVENOUS | Status: AC
  Administered 2018-12-12: 10:00:00 1000 mL via INTRAVENOUS

## 2018-12-12 MED ORDER — SODIUM CHLORIDE 0.9 % IV BOLUS
1000.0000 mL | Freq: Once | INTRAVENOUS | Status: AC
  Administered 2018-12-12: 1000 mL via INTRAVENOUS

## 2018-12-12 MED ORDER — ONDANSETRON HCL 4 MG PO TABS: 4.0000 mg | ORAL_TABLET | Freq: Four times a day (QID) | ORAL | Status: DC | PRN

## 2018-12-12 MED ORDER — FLUCONAZOLE 100MG IVPB
100.0000 mg | INTRAVENOUS | Status: DC
  Administered 2018-12-13 – 2018-12-14 (×2): 100 mg via INTRAVENOUS
  Filled 2018-12-12 (×3): qty 50

## 2018-12-12 MED ORDER — PANTOPRAZOLE SODIUM 40 MG IV SOLR
40.0000 mg | Freq: Two times a day (BID) | INTRAVENOUS | Status: DC
  Administered 2018-12-12 – 2018-12-17 (×10): 40 mg via INTRAVENOUS
  Filled 2018-12-12 (×11): qty 40

## 2018-12-12 MED ORDER — MAGIC MOUTHWASH W/LIDOCAINE
10.0000 mL | Freq: Three times a day (TID) | ORAL | Status: DC
  Administered 2018-12-12 – 2018-12-26 (×38): 10 mL via ORAL
  Filled 2018-12-12 (×46): qty 10

## 2018-12-12 MED ORDER — VITAMIN B-12 1000 MCG PO TABS
1000.0000 ug | ORAL_TABLET | Freq: Every day | ORAL | Status: DC
  Administered 2018-12-13 – 2018-12-26 (×14): 1000 ug via ORAL
  Filled 2018-12-12 (×15): qty 1

## 2018-12-12 MED ORDER — CHLORHEXIDINE GLUCONATE CLOTH 2 % EX PADS
6.0000 | MEDICATED_PAD | Freq: Every day | CUTANEOUS | Status: DC
  Administered 2018-12-12 – 2018-12-25 (×12): 6 via TOPICAL

## 2018-12-12 MED ORDER — FERROUS SULFATE 325 (65 FE) MG PO TABS
325.0000 mg | ORAL_TABLET | Freq: Every day | ORAL | Status: DC
  Administered 2018-12-13: 325 mg via ORAL
  Filled 2018-12-12: qty 1

## 2018-12-12 MED ORDER — POTASSIUM CHLORIDE CRYS ER 20 MEQ PO TBCR
40.0000 meq | EXTENDED_RELEASE_TABLET | Freq: Once | ORAL | Status: DC
  Filled 2018-12-12: qty 2

## 2018-12-12 MED ORDER — ONDANSETRON HCL 4 MG/2ML IJ SOLN
4.0000 mg | Freq: Four times a day (QID) | INTRAMUSCULAR | Status: DC | PRN
  Administered 2018-12-12 – 2018-12-15 (×4): 4 mg via INTRAVENOUS
  Filled 2018-12-12 (×4): qty 2

## 2018-12-12 MED ORDER — LIDOCAINE VISCOUS HCL 2 % MT SOLN: 15.0000 mL | Freq: Three times a day (TID) | OROMUCOSAL | Status: DC

## 2018-12-12 MED ORDER — FLUCONAZOLE IN SODIUM CHLORIDE 200-0.9 MG/100ML-% IV SOLN
200.0000 mg | Freq: Once | INTRAVENOUS | Status: AC
  Administered 2018-12-12: 200 mg via INTRAVENOUS
  Filled 2018-12-12: qty 100

## 2018-12-12 MED ORDER — SODIUM CHLORIDE 0.9 % IV SOLN
INTRAVENOUS | Status: DC
  Administered 2018-12-13 – 2018-12-24 (×11): via INTRAVENOUS

## 2018-12-12 MED ORDER — POTASSIUM CHLORIDE 10 MEQ/100ML IV SOLN
10.0000 meq | INTRAVENOUS | Status: AC
  Administered 2018-12-12 (×4): 10 meq via INTRAVENOUS
  Filled 2018-12-12 (×3): qty 100

## 2018-12-12 MED ORDER — ASPIRIN EC 81 MG PO TBEC
81.0000 mg | DELAYED_RELEASE_TABLET | Freq: Every day | ORAL | Status: DC
  Administered 2018-12-13 – 2018-12-26 (×14): 81 mg via ORAL
  Filled 2018-12-12 (×15): qty 1

## 2018-12-12 NOTE — H&P (Addendum)
History and Physical    Nicholas Sanchez OZD:[id number] DOB: 24-Jan-1934 DOA: 12/12/2018  PCP: Sinda Du, MD   Patient coming from: Home  Chief Complaint: Decreased appetite/poor oral intake/hypotension  HPI: Nicholas Sanchez is a 85 y.o. male with medical history significant for stage III squamous cell lung cancer currently on radiation treatment, CAD with prior MI, paroxysmal atrial fibrillation currently off Xarelto, hypertension, GERD, and recent weight loss who presented to the ED at the urging of his oncologist due to worsening dehydration and soft blood pressure readings.  He has had some generalized worsening weakness over the last 1 week and has had poor appetite as well as reduced p.o. intake on account of odynophagia.  He is also had some mild epigastric abdominal tenderness, but no nausea, vomiting, or diarrhea.  No fevers or chills noted.  He denies any chest pain, cough, shortness of breathing, or hemoptysis.  He states that he tries to take his Magic mouthwash at home on account of his ongoing symptoms, but this has not been helping very much.   ED Course: He is noted to have stable vital signs, but is noted to have BUN of 47 and creatinine of 1.82 with baseline creatinine noted to be 0.9-1.  His potassium is also 3.1 and sodium 133.  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  COVID testing is currently pending.  1 view chest x-ray with no acute findings noted.  Discussed case with oncology who recommends ongoing use of Magic mouthwash and hydration.  Discussed case with GI who will evaluate patient for possible endoscopy and plans to initiate Diflucan.  Review of Systems: All others reviewed and otherwise negative.  Past Medical History:  Diagnosis Date  . CAD (coronary artery disease)    a. STEMI 04/2008 s/p BMS to prox & distal RCA, staged DES to LAD same admission. b. Nuc 03/2017: scar but no  ischemia, EF 45-54%. c. 09/2017: NSTEMI - DES x 2 to proximal and mid-RCA  . Cancer ([place])   . CKD (chronic kidney disease), stage II   . Cough, persistent   . GERD (gastroesophageal reflux disease)   . History of BPH   . HTN (hypertension)   . Hypercholesteremia   . LV dysfunction    a. EF 45-50% in 01/2016.  . Mild pulmonary hypertension ([place])   . PAF (paroxysmal atrial fibrillation) ([place])   . Port-A-Cath in place 11/21/2018  . Presence of permanent cardiac pacemaker   . Sinus node dysfunction ([place])    a. s/p StJude CRT-pacemaker 01/2016.    Past Surgical History:  Procedure Laterality Date  . CARDIAC CATHETERIZATION    . COLONOSCOPY    . COLONOSCOPY N/A 08/27/2017   Dr. [name]: 25 5-25 mm polyps in descending colon, ascending, cecum. 2X3 cm carpet polyp in base of cecum lifted away s/p piecemeal polypectomy. Tubulovillous adenoma and tubular adenomas  . CORONARY STENT INTERVENTION N/A 10/22/2017   Procedure: CORONARY STENT INTERVENTION;  Surgeon: [name], MD;  Location: [facility];  Service: Cardiovascular;  Laterality: N/A;  . CORONARY STENT PLACEMENT  04/2008   RCA and LAD  . EP IMPLANTABLE DEVICE N/A 01/09/2016   Procedure: BiV Pacemaker Insertion CRT-P;  Surgeon: [name], MD;  Location: [facility];  Service: Cardiovascular;  Laterality: N/A;  . ESOPHAGOGASTRODUODENOSCOPY N/A 08/27/2017   normal esophagus s/p dilation, gastric petechia, small hiatal hernia, normal  duodenum  . INSERT / REPLACE / REMOVE PACEMAKER    . MALONEY DILATION N/A 08/27/2017   Procedure: [name] DILATION;  Surgeon: [name], MD;  Location: [facility];  Service: Endoscopy;  Laterality: N/A;  . PORTACATH PLACEMENT Right 11/18/2018   Procedure: INSERTION PORT-A-CATH;  Surgeon: [name], MD;  Location: [facility];  Service: General;  Laterality: Right;  . RIGHT/LEFT HEART CATH AND CORONARY ANGIOGRAPHY N/A 10/21/2017   Procedure: RIGHT/LEFT HEART CATH AND CORONARY  ANGIOGRAPHY;  Surgeon: [name], MD;  Location: [facility];  Service: Cardiovascular;  Laterality: N/A;     reports that he quit smoking about 45 years ago. His smoking use included cigarettes. He has a 62.50 pack-year smoking history. He quit smokeless tobacco use about 45 years ago.  His smokeless tobacco use included chew. He reports that he does not drink alcohol or use drugs.  Allergies  Allergen Reactions  . Ace Inhibitors Nausea Only  . Tape Rash    And Bandaids, too    Family History  Problem Relation Age of Onset  . Emphysema Mother        mother died with emphysema and cancer  . Cancer Mother   . Prostate cancer Brother   . Heart disease Brother   . Hypothyroidism Daughter   . Hypothyroidism Daughter   . Colon cancer Neg Hx   . Colon polyps Neg Hx     Prior to Admission medications   Medication Sig Start Date End Date Taking? Authorizing Provider  acetaminophen (TYLENOL) 500 MG tablet Take 500 mg by mouth every 6 (six) hours as needed for pain.    [provider]  aspirin EC 81 MG tablet Take 81 mg by mouth daily.    [provider]  CARBOPLATIN IV Inject into the vein once a week. 11/21/18   [provider]  cholecalciferol (VITAMIN D) 1000 units tablet Take 1,000 Units by mouth 2 (two) times daily.    [provider]  clopidogrel (PLAVIX) 75 MG tablet Take 75 mg by mouth daily.    [provider]  dorzolamide-timolol (COSOPT) 22.3-6.8 MG/ML ophthalmic solution Place 1 drop into the left eye 2 (two) times daily.    [provider]  ferrous sulfate 325 (65 FE) MG tablet Take 325 mg by mouth daily.     [provider]  hydrochlorothiazide (HYDRODIURIL) 25 MG tablet TAKE ONE (1) TABLET BY MOUTH EVERY DAY Patient taking differently: Take 25 mg by mouth daily.  03/10/18   Herminio Commons, MD  lidocaine-prilocaine (EMLA) cream Apply a dime-sized amount to port a cath site and cover with plastic  wrap one hour prior to chemotherapy appointments 11/14/18   Derek Jack, MD  losartan (COZAAR) 25 MG tablet TAKE ONE TABLET BY [place] Patient taking differently: Take 25 mg by mouth daily.  05/26/18   Herminio Commons, MD  lovastatin (MEVACOR) 10 MG tablet TAKE ONE (1) TABLET BY MOUTH EVERY DAY 06/14/18   Herminio Commons, MD  metoprolol succinate (TOPROL XL) 25 MG 24 hr tablet Take 1 tablet (25 mg total) by mouth daily. 08/08/18   Herminio Commons, MD  ondansetron (ZOFRAN ODT) 8 MG disintegrating tablet Take 1 tablet (8 mg total) by mouth 2 (two) times daily as needed for nausea or vomiting. Patient not taking: Reported on 11/28/2018 11/22/18   Derek Jack, MD  PACLITAXEL IV Inject into the vein once a week. 11/21/18   [provider]  pantoprazole (PROTONIX) 40 MG tablet Take 40 mg by mouth daily.    [provider]  prochlorperazine (COMPAZINE) 10 MG tablet Take 1 tablet (10 mg total) by mouth every 6 (six) hours as needed for nausea or vomiting. Patient not taking: Reported on 11/28/2018 11/14/18   Derek Jack, MD  Propylene Glycol (SYSTANE BALANCE OP) Place 1 drop into both eyes 2 (two) times daily as needed (dry eyes).     [provider]  scopolamine (TRANSDERM-SCOP) 1 MG/3DAYS Place 1 patch (1.5 mg total) onto the skin every 3 (three) days. Patient not taking: Reported on 11/28/2018 11/23/18   Derek Jack, MD  tamsulosin (FLOMAX) 0.4 MG CAPS capsule Take 0.4 mg by mouth daily.    [provider]  triamcinolone cream (KENALOG) 0.1 % Apply 1 application topically daily as needed (itching).     [provider]  vitamin B-12 (CYANOCOBALAMIN) 1000 MCG tablet Take 1,000 mcg by mouth daily.    [provider]    Physical Exam: Vitals:   12/12/18 1230 12/12/18 1331 12/12/18 1347 12/12/18 1349  BP: (!) 104/51 136/69  (!) 113/47  Pulse: 70 79  69  Resp: (!) 34 (!) 28  19  Temp:      TempSrc:       SpO2: 100% 100%  100%  Weight:   90.4 kg   Height:   6\' 1"  (1.854 m)     Constitutional: NAD, calm, comfortable Vitals:   12/12/18 1230 12/12/18 1331 12/12/18 1347 12/12/18 1349  BP: (!) 104/51 136/69  (!) 113/47  Pulse: 70 79  69  Resp: (!) 34 (!) 28  19  Temp:      TempSrc:      SpO2: 100% 100%  100%  Weight:   90.4 kg   Height:   6\' 1"  (1.854 m)    Eyes: lids and conjunctivae normal ENMT: Mucous membranes are moist.  No plaque lesions noted. Neck: normal, supple Respiratory: clear to auscultation bilaterally. Normal respiratory effort. No accessory muscle use.  Cardiovascular: Regular rate and rhythm, no murmurs. No extremity edema. Abdomen: Minimal tenderness to palpation in the epigastric region, no distention. Bowel sounds positive.  Musculoskeletal:  No joint deformity upper and lower extremities.   Skin: no rashes, lesions, ulcers.  Psychiatric: Normal judgment and insight. Alert and oriented x 3. Normal mood.   Labs on Admission: I have personally reviewed following labs and imaging studies  CBC: Recent Labs  Lab 12/09/18 0900 12/12/18 0930  WBC 2.4* 2.6*  NEUTROABS 2.1 2.1  HGB 9.2* 7.8*  HCT 28.9* 23.8*  MCV 97.0 96.7  PLT 146* 614*   Basic Metabolic Panel: Recent Labs  Lab 12/12/18 0930 12/12/18 1102  NA 133*  --   K 3.1*  --   CL 96*  --   CO2 23  --   GLUCOSE 116*  --   BUN 47*  --   CREATININE 1.82*  --   CALCIUM 8.9  --   MG  --  1.7   GFR: Estimated Creatinine Clearance: 34.8 mL/min (A) (by C-G formula based on SCr of 1.82 mg/dL (H)). Liver Function Tests: Recent Labs  Lab 12/12/18 0930  AST 24  ALT 35  ALKPHOS 49  BILITOT 2.6*  PROT 6.7  ALBUMIN 3.9   No results for input(s): LIPASE, AMYLASE in the last 168 hours. No results for input(s): AMMONIA in the last 168 hours. Coagulation Profile: No results for input(s): INR, PROTIME in the last 168 hours.  Cardiac Enzymes: No results for input(s): CKTOTAL, CKMB, CKMBINDEX,  TROPONINI in the last 168 hours. BNP (last 3 results) No results for input(s): PROBNP in the last 8760 hours. HbA1C: No results for input(s): HGBA1C in the last 72 hours. CBG: No results for input(s): GLUCAP in the last 168 hours. Lipid Profile: No results for input(s): CHOL, HDL, LDLCALC, TRIG, CHOLHDL, LDLDIRECT in the last 72 hours. Thyroid Function Tests: No results for input(s): TSH, T4TOTAL, FREET4, T3FREE, THYROIDAB in the last 72 hours. Anemia Panel: No results for input(s): VITAMINB12, FOLATE, FERRITIN, TIBC, IRON, RETICCTPCT in the last 72 hours. Urine analysis:    Component Value Date/Time   COLORURINE AMBER (A) 12/12/2018 0913   APPEARANCEUR HAZY (A) 12/12/2018 0913   LABSPEC 1.023 12/12/2018 0913   PHURINE 6.0 12/12/2018 0913   GLUCOSEU NEGATIVE 12/12/2018 0913   HGBUR NEGATIVE 12/12/2018 0913   BILIRUBINUR NEGATIVE 12/12/2018 [place] NEGATIVE 12/12/2018 0913   PROTEINUR 30 (A) 12/12/2018 0913   NITRITE NEGATIVE 12/12/2018 0913   LEUKOCYTESUR NEGATIVE 12/12/2018 0913    Radiological Exams on Admission: Dg Chest Port 1 View  Result Date: 12/12/2018 CLINICAL DATA:  Cough, weakness. EXAM: PORTABLE CHEST 1 VIEW COMPARISON:  November 18, 2018. FINDINGS: Stable cardiomediastinal silhouette. Stable left-sided pacemaker. Stable right subclavian Port-A-Cath. No pneumothorax or pleural effusion is noted. Both lungs are clear. The visualized skeletal structures are unremarkable. IMPRESSION: No active disease. Electronically Signed   By: Marijo Conception M.D.   On: 12/12/2018 09:38    EKG: Independently reviewed.  Paced rhythm 70 bpm.  Assessment/Plan Active Problems:   Weakness    AKI-likely prerenal -Patient does appear to be dehydrated and is having poor oral intake -Continue IV fluid hydration -Hold home losartan and HCTZ -Monitor strict I's and O's -Repeat BMP in a.m. -Further evaluation if there is not improvement in creatinine levels noted -Check  orthostatic vital signs in a.m.  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  Hypokalemia -Replete IV -Recheck in a.m. along with magnesium  Stage III squamous cell carcinoma of the left lung -Patient undergoing treatment with chemoradiation  CAD -Continue aspirin for now and hold Plavix with recent hemoptysis -Maintain on metoprolol  Paroxysmal atrial fibrillation -Hold Xarelto for now given recent hemoptysis -Maintain on metoprolol with telemetry monitoring  Worsening anemia -Continue to monitor on repeat CBC -Does not appear to have any overt bleeding -Check anemia panel and stool occult -Continue on iron and B12 supplementation   DVT prophylaxis: SCDs Code Status: Full Family Communication: Daughter at bedside Disposition Plan: Admit for treatment of AKI with IV fluid and GI evaluation for odynophagia Consults called: GI Admission status: Observation, telemetry   Daria Mcmeekin Darleen Crocker DO [facility] Pager [phone]  If 7PM-7AM, please contact night-coverage [email or web address] Password [email or web address]  12/12/2018, 2:56 PM

## 2018-12-12 NOTE — ED Provider Notes (Signed)
[facility] EMERGENCY DEPARTMENT Provider Note   CSN: [id number] Arrival date & time: 12/12/18  5188     History   Chief Complaint Chief Complaint  Patient presents with  . Hypotension    HPI Nicholas Sanchez is a 85 y.o. male.     Patient with hx lung cancer, currently receiving radiation and chemotherapy, presents from radiation therapy office. Patient indicates has been feeling generally weak for the past week. Symptoms gradual onset, moderate, constant, slowly worse. Poor appetite, decreased po intake, 2-3 lb wt decrease in past 1-2 weeks. Denies abd pain or nvd. No dysuria or gu c/o. Has chronic, intermittent, non productive cough - no acute change or worsening, no hemoptysis. No sore throat. No known covid exposure. Denies chest pain or discomfort. No headache. No focal or unilateral numbness or weakness. No change in speech or vision. Compliant w home meds, no recent change. No fever or chills. No trauma or fall. Patient reports sent to ED by his cancer/radiation md due to concern for soft/low blood pressure and dehydration.   The history is provided by the patient and a relative.    Past Medical History:  Diagnosis Date  . CAD (coronary artery disease)    a. STEMI 04/2008 s/p BMS to prox & distal RCA, staged DES to LAD same admission. b. Nuc 03/2017: scar but no ischemia, EF 45-54%. c. 09/2017: NSTEMI - DES x 2 to proximal and mid-RCA  . Cancer ([place])   . CKD (chronic kidney disease), stage II   . Cough, persistent   . GERD (gastroesophageal reflux disease)   . History of BPH   . HTN (hypertension)   . Hypercholesteremia   . LV dysfunction    a. EF 45-50% in 01/2016.  . Mild pulmonary hypertension ([place])   . PAF (paroxysmal atrial fibrillation) ([place])   . Port-A-Cath in place 11/21/2018  . Presence of permanent cardiac pacemaker   . Sinus node dysfunction ([place])    a. s/p StJude CRT-pacemaker 01/2016.    Patient Active Problem List   Diagnosis Date Noted  .  Neutropenia, drug-induced ([place]) 12/05/2018  . Port-A-Cath in place 11/21/2018  . Hemoptysis 11/16/2018  . Anticoagulant long-term use 11/16/2018  . Hyponatremia 11/16/2018  . Goals of care, counseling/discussion 11/16/2018  . Squamous cell carcinoma of lung, left ([place]) 11/09/2018  . Mass of lower lobe of left lung 10/26/2018  . Hx of adenomatous colonic polyps 11/09/2017  . Angina at rest [facility]) 10/20/2017  . NSTEMI (non-ST elevated myocardial infarction) ([place]) 10/20/2017  . IDA (iron deficiency anemia) 08/04/2017  . Dysphagia 08/04/2017  . Paroxysmal atrial fibrillation ([place]) 01/21/2016  . Heart block 01/08/2016  . CAD (coronary artery disease)   . HTN (hypertension)   . Hypercholesteremia   . GERD (gastroesophageal reflux disease)     Past Surgical History:  Procedure Laterality Date  . CARDIAC CATHETERIZATION    . COLONOSCOPY    . COLONOSCOPY N/A 08/27/2017   Dr. [name]: 25 5-25 mm polyps in descending colon, ascending, cecum. 2X3 cm carpet polyp in base of cecum lifted away s/p piecemeal polypectomy. Tubulovillous adenoma and tubular adenomas  . CORONARY STENT INTERVENTION N/A 10/22/2017   Procedure: CORONARY STENT INTERVENTION;  Surgeon: [name], MD;  Location: [place] CV LAB;  Service: Cardiovascular;  Laterality: N/A;  . CORONARY STENT PLACEMENT  04/2008   RCA and LAD  . EP IMPLANTABLE DEVICE N/A 01/09/2016   Procedure: BiV Pacemaker Insertion CRT-P;  Surgeon: [name], MD;  Location:  [place] INVASIVE CV LAB;  Service: Cardiovascular;  Laterality: N/A;  . ESOPHAGOGASTRODUODENOSCOPY N/A 08/27/2017   normal esophagus s/p dilation, gastric petechia, small hiatal hernia, normal duodenum  . INSERT / REPLACE / REMOVE PACEMAKER    . MALONEY DILATION N/A 08/27/2017   Procedure: [name] DILATION;  Surgeon: [name], MD;  Location: [facility] ENDO SU[facility];  Service: Endoscopy;  Laterality: N/A;  . PORTACATH PLACEMENT Right 11/18/2018   Procedure: INSERTION PORT-A-CATH;  Surgeon:  [name], MD;  Location: [facility] ORS;  Service: General;  Laterality: Right;  . RIGHT/LEFT HEART CATH AND CORONARY ANGIOGRAPHY N/A 10/21/2017   Procedure: RIGHT/LEFT HEART CATH AND CORONARY ANGIOGRAPHY;  Surgeon: [name], MD;  Location: [place] CV LAB;  Service: Cardiovascular;  Laterality: N/A;        Home Medications    Prior to Admission medications   Medication Sig Start Date End Date Taking? Authorizing Provider  acetaminophen (TYLENOL) 500 MG tablet Take 500 mg by mouth every 6 (six) hours as needed for pain.    [provider]  aspirin EC 81 MG tablet Take 81 mg by mouth daily.    [provider]  CARBOPLATIN IV Inject into the vein once a week. 11/21/18   [provider]  cholecalciferol (VITAMIN D) 1000 units tablet Take 1,000 Units by mouth 2 (two) times daily.    [provider]  dorzolamide-timolol (COSOPT) 22.3-6.8 MG/ML ophthalmic solution Place 1 drop into the left eye 2 (two) times daily.    [provider]  ferrous sulfate 325 (65 FE) MG tablet Take 325 mg by mouth daily.     [provider]  hydrochlorothiazide (HYDRODIURIL) 25 MG tablet TAKE ONE (1) TABLET BY MOUTH EVERY DAY Patient taking differently: Take 25 mg by mouth daily.  03/10/18   Herminio Commons, MD  lidocaine-prilocaine (EMLA) cream Apply a dime-sized amount to port a cath site and cover with plastic wrap one hour prior to chemotherapy appointments Patient not taking: Reported on 12/05/2018 11/14/18   Derek Jack, MD  losartan (COZAAR) 25 MG tablet [place] Patient taking differently: Take 25 mg by mouth daily.  05/26/18   Herminio Commons, MD  lovastatin (MEVACOR) 10 MG tablet TAKE ONE (1) TABLET BY MOUTH EVERY DAY 06/14/18   Herminio Commons, MD  metoprolol succinate (TOPROL XL) 25 MG 24 hr tablet Take 1 tablet (25 mg total) by mouth daily. 08/08/18   Herminio Commons, MD  ondansetron (ZOFRAN ODT) 8 MG  disintegrating tablet Take 1 tablet (8 mg total) by mouth 2 (two) times daily as needed for nausea or vomiting. Patient not taking: Reported on 11/28/2018 11/22/18   Derek Jack, MD  PACLITAXEL IV Inject into the vein once a week. 11/21/18   [provider]  pantoprazole (PROTONIX) 40 MG tablet Take 40 mg by mouth daily.    [provider]  prochlorperazine (COMPAZINE) 10 MG tablet Take 1 tablet (10 mg total) by mouth every 6 (six) hours as needed for nausea or vomiting. Patient not taking: Reported on 11/28/2018 11/14/18   Derek Jack, MD  Propylene Glycol (SYSTANE BALANCE OP) Place 1 drop into both eyes 2 (two) times daily as needed (dry eyes).     [provider]  scopolamine (TRANSDERM-SCOP) 1 MG/3DAYS Place 1 patch (1.5 mg total) onto the skin every 3 (three) days. Patient not taking: Reported on 11/28/2018 11/23/18   Derek Jack, MD  tamsulosin (FLOMAX) 0.4 MG CAPS capsule  Take 0.4 mg by mouth daily.    [provider]  triamcinolone cream (KENALOG) 0.1 % Apply 1 application topically daily as needed (itching).     [provider]  vitamin B-12 (CYANOCOBALAMIN) 1000 MCG tablet Take 1,000 mcg by mouth daily.    [provider]    Family History Family History  Problem Relation Age of Onset  . Emphysema Mother        mother died with emphysema and cancer  . Cancer Mother   . Prostate cancer Brother   . Heart disease Brother   . Hypothyroidism Daughter   . Hypothyroidism Daughter   . Colon cancer Neg Hx   . Colon polyps Neg Hx     Social History Social History   Tobacco Use  . Smoking status: Former Smoker    Packs/day: 2.50    Years: 25.00    Pack years: 62.50    Types: Cigarettes    Quit date: 06/14/1973    Years since quitting: 45.5  . Smokeless tobacco: Former Systems developer    Types: Chew    Quit date: 06/14/1973  . Tobacco comment: some/chewed very little for about 2 years  Substance Use Topics  .  Alcohol use: No  . Drug use: No     Allergies   Ace inhibitors and Tape   Review of Systems Review of Systems  Constitutional: Negative for chills and fever.  HENT: Negative for sore throat.   Eyes: Negative for redness.  Respiratory: Positive for cough. Negative for shortness of breath.   Cardiovascular: Negative for chest pain, palpitations and leg swelling.  Gastrointestinal: Negative for abdominal pain, blood in stool, diarrhea and vomiting.  Endocrine: Negative for polyuria.  Genitourinary: Negative for dysuria and flank pain.  Musculoskeletal: Negative for back pain and neck pain.  Skin: Negative for rash.  Neurological: Negative for numbness and headaches.  Hematological: Does not bruise/bleed easily.  Psychiatric/Behavioral: Negative for confusion.     Physical Exam Updated Vital Signs BP (!) 106/47 (BP Location: Left Arm)   Pulse 70   Temp 98.1 F (36.7 C) (Oral)   Resp 18   Ht 1.854 m (6\' 1" )   Wt 90.3 kg   SpO2 100%   BMI 26.25 kg/m   Physical Exam Vitals signs and nursing note reviewed.  Constitutional:      Appearance: He is well-developed.     Comments: Generally weak/frail appearing.   HENT:     Head: Atraumatic.     Nose: Nose normal.     Mouth/Throat:     Mouth: Mucous membranes are moist.     Pharynx: Oropharynx is clear.  Eyes:     General: No scleral icterus.    Conjunctiva/sclera: Conjunctivae normal.     Pupils: Pupils are equal, round, and reactive to light.  Neck:     Musculoskeletal: Normal range of motion and neck supple. No neck rigidity.     Trachea: No tracheal deviation.     Comments: No stiffness or rigidity.  Cardiovascular:     Rate and Rhythm: Normal rate and regular rhythm.     Pulses: Normal pulses.     Heart sounds: Normal heart sounds. No murmur. No friction rub. No gallop.   Pulmonary:     Effort: Pulmonary effort is normal. No accessory muscle usage or respiratory distress.     Breath sounds: Normal breath  sounds.     Comments: Port right chest without sign of infection.  Abdominal:  General: Bowel sounds are normal. There is no distension.     Palpations: Abdomen is soft.     Tenderness: There is no abdominal tenderness. There is no guarding.  Genitourinary:    Comments: No cva tenderness. Musculoskeletal:        General: No swelling or tenderness.     Right lower leg: No edema.     Left lower leg: No edema.  Skin:    General: Skin is warm and dry.     Findings: No rash.  Neurological:     Mental Status: He is alert.     Comments: Alert, speech clear/fluent. Motor/sens grossly intact bil.   Psychiatric:        Mood and Affect: Mood normal.      ED Treatments / Results  Labs (all labs ordered are listed, but only abnormal results are displayed) Results for orders placed or performed during the hospital encounter of 12/12/18  Comprehensive metabolic panel  Result Value Ref Range   Sodium 133 (L) 135 - 145 mmol/L   Potassium 3.1 (L) 3.5 - 5.1 mmol/L   Chloride 96 (L) 98 - 111 mmol/L   CO2 23 22 - 32 mmol/L   Glucose, Bld 116 (H) 70 - 99 mg/dL   BUN 47 (H) 8 - 23 mg/dL   Creatinine, Ser 1.82 (H) 0.61 - 1.24 mg/dL   Calcium 8.9 8.9 - 10.3 mg/dL   Total Protein 6.7 6.5 - 8.1 g/dL   Albumin 3.9 3.5 - 5.0 g/dL   AST 24 15 - 41 U/L   ALT 35 0 - 44 U/L   Alkaline Phosphatase 49 38 - 126 U/L   Total Bilirubin 2.6 (H) 0.3 - 1.2 mg/dL   GFR calc non Af Amer 34 (L) >60 mL/min   GFR calc Af Amer 39 (L) >60 mL/min   Anion gap 14 5 - 15  CBC with Differential/Platelet  Result Value Ref Range   WBC 2.6 (L) 4.0 - 10.5 K/uL   RBC 2.46 (L) 4.22 - 5.81 MIL/uL   Hemoglobin 7.8 (L) 13.0 - 17.0 g/dL   HCT 23.8 (L) 39.0 - 52.0 %   MCV 96.7 80.0 - 100.0 fL   MCH 31.7 26.0 - 34.0 pg   MCHC 32.8 30.0 - 36.0 g/dL   RDW 14.6 11.5 - 15.5 %   Platelets 134 (L) 150 - 400 K/uL   nRBC 1.6 (H) 0.0 - 0.2 %   Neutrophils Relative % 83 %   Neutro Abs 2.1 1.7 - 7.7 K/uL   Lymphocytes Relative 4  %   Lymphs Abs 0.1 (L) 0.7 - 4.0 K/uL   Monocytes Relative 13 %   Monocytes Absolute 0.3 0.1 - 1.0 K/uL   Eosinophils Relative 0 %   Eosinophils Absolute 0.0 0.0 - 0.5 K/uL   Basophils Relative 0 %   Basophils Absolute 0.0 0.0 - 0.1 K/uL   Immature Granulocytes 0 %   Abs Immature Granulocytes 0.01 0.00 - 0.07 K/uL  Troponin I (High Sensitivity)  Result Value Ref Range   Troponin I (High Sensitivity) 43 (H) <18 ng/L   Dg Chest 2 View  Result Date: 11/16/2018 CLINICAL DATA:  85 year old male with hemoptysis. EXAM: CHEST - 2 VIEW COMPARISON:  Chest radiograph dated 10/31/2018 FINDINGS: There is diffuse chronic interstitial coarsening and mild bronchitic changes. Bibasilar interstitial coarsening/scarring or atelectasis. An area of increased density over the right minor fissure has improved since the prior radiograph. No new consolidation. No large pleural effusion or  pneumothorax. Stable cardiomegaly. Atherosclerotic calcification of the aorta. Left pectoral pacemaker device. No acute osseous pathology. IMPRESSION: Chronic changes.  No new consolidation. Electronically Signed   By: Anner Crete M.D.   On: 11/16/2018 10:23   Ct Angio Chest Pe W And/or Wo Contrast  Result Date: 11/16/2018 CLINICAL DATA:  PE suspected high pretest probability. Status post lung biopsy. Hemoptysis. EXAM: CT ANGIOGRAPHY CHEST WITH CONTRAST TECHNIQUE: Multidetector CT imaging of the chest was performed using the standard protocol during bolus administration of intravenous contrast. Multiplanar CT image reconstructions and MIPs were obtained to evaluate the vascular anatomy. CONTRAST:  130mL OMNIPAQUE IOHEXOL 350 MG/ML SOLN COMPARISON:  CT dated September 27, 2018. FINDINGS: Cardiovascular: Evaluation is limited by motion artifact.There is no pulmonary embolus. The main pulmonary artery is dilated measuring approximately 3.6 cm in diameter the heart size is enlarged. Aortic calcifications and coronary artery calcifications  are noted. There may be coronary artery stents in place. There is a left-sided pacemaker in place. Mediastinum/Nodes: --pathologically enlarged mediastinal and hilar lymph nodes are again noted. --No axillary lymphadenopathy. --No supraclavicular lymphadenopathy. --Normal thyroid gland. --The esophagus is unremarkable Lungs/Pleura: Again noted are emphysematous changes bilaterally with chronic areas of scarring and atelectasis with potential for underlying pulmonary fibrosis. There is a small left-sided pleural effusion which is new from prior study. Again noted is a left lower lobe lung mass measuring approximately 2.8 by 3 cm. There is no pneumothorax. There is some debris within the dependent portion of the trachea. Upper Abdomen: No acute abnormality. The spleen is likely borderline enlarged but is only partially visualized on this exam. Musculoskeletal: No chest wall abnormality. No acute or significant osseous findings. Review of the MIP images confirms the above findings. IMPRESSION: 1. Study is limited by motion artifact. 2. No evidence for pulmonary embolus. 3. New small left-sided pleural effusion.  No pneumothorax. 4. Again noted is a left lower lobe 3 cm lung mass with evidence for nodal metastatic disease to the mediastinum. Aortic Atherosclerosis (ICD10-I70.0) and Emphysema (ICD10-J43.9). Electronically Signed   By: Constance Holster M.D.   On: 11/16/2018 12:28   Dg Chest Port 1 View  Result Date: 12/12/2018 CLINICAL DATA:  Cough, weakness. EXAM: PORTABLE CHEST 1 VIEW COMPARISON:  November 18, 2018. FINDINGS: Stable cardiomediastinal silhouette. Stable left-sided pacemaker. Stable right subclavian Port-A-Cath. No pneumothorax or pleural effusion is noted. Both lungs are clear. The visualized skeletal structures are unremarkable. IMPRESSION: No active disease. Electronically Signed   By: Marijo Conception M.D.   On: 12/12/2018 09:38   Dg Chest Port 1 View  Result Date: 11/18/2018 CLINICAL DATA:   Port-A-Cath placement. EXAM: PORTABLE CHEST 1 VIEW COMPARISON:  Radiographs 11/16/2018 and 10/31/2018.  CT 11/16/2018. FINDINGS: 1053 hours. New right subclavian Port-A-Cath tip projects to the mid SVC level. Left subclavian pacemaker leads appear unchanged. The heart size and mediastinal contours are stable. There is aortic atherosclerosis. Patchy perihilar pulmonary opacities superimposed on chronic lung disease are stable. There is no pleural effusion or pneumothorax. IMPRESSION: New Port-A-Cath tip at the mid SVC level. No pneumothorax or other acute findings. Electronically Signed   By: Richardean Sale M.D.   On: 11/18/2018 11:16   Dg C-arm 1-60 Min-no Report  Result Date: 11/18/2018 Fluoroscopy was utilized by the requesting physician.  No radiographic interpretation.    EKG EKG Interpretation  Date/Time:  Monday December 12 2018 09:12:34 EDT Ventricular Rate:  70 PR Interval:    QRS Duration: 149 QT Interval:  467 QTC Calculation: 504 R  Axis:   -64 Text Interpretation:  Electronic ventricular pacemaker Confirmed by Lajean Saver [phone]) on 12/12/2018 9:18:25 AM   Radiology Dg Chest Port 1 View  Result Date: 12/12/2018 CLINICAL DATA:  Cough, weakness. EXAM: PORTABLE CHEST 1 VIEW COMPARISON:  November 18, 2018. FINDINGS: Stable cardiomediastinal silhouette. Stable left-sided pacemaker. Stable right subclavian Port-A-Cath. No pneumothorax or pleural effusion is noted. Both lungs are clear. The visualized skeletal structures are unremarkable. IMPRESSION: No active disease. Electronically Signed   By: Marijo Conception M.D.   On: 12/12/2018 09:38    Procedures Procedures (including critical care time)  Medications Ordered in ED Medications  sodium chloride 0.9 % bolus 1,000 mL (has no administration in time range)     Initial Impression / Assessment and Plan / ED Course  I have reviewed the triage vital signs and the nursing notes.  Pertinent labs & imaging results that were  available during my care of the patient were reviewed by me and considered in my medical decision making (see chart for details).  Iv ns. Stat labs. Ecg. Iv ns bolus. Po fluids.  Reviewed nursing notes and prior charts for additional history.   Labs reviewed by me - k low. Significant AKI on labs.   Additional iv ns bolus. Mg added to labs. kcl replacement given.   CXR reviewed by me - no pna.   Hospitalists consulted for admission re dehydration and AKI.     Final Clinical Impressions(s) / ED Diagnoses   Final diagnoses:  None    ED Discharge Orders    None       Lajean Saver, MD 12/12/18 1104

## 2018-12-12 NOTE — ED Notes (Signed)
Pt. Was informed urine sample is needed. Urinal left at beside.

## 2018-12-12 NOTE — Consult Note (Addendum)
Referring Provider: Dr. Manuella Ghazi  Primary Care Physician:  Sinda Du, MD Primary Gastroenterologist:  Dr. [name]   Date of Admission: 12/12/2018 Date of Consultation: 12/12/2018  Reason for Consultation:  Dysphagia/Odynophagia   HPI:  Nicholas Sanchez is an 85 y.o. year old male known to [facility] with history of IDA in setting of Xarelto, s/p EGD/colonoscopy June 2019. EGD with normal esophagus s/p dilation, gastric petechia, normal duodenum. Colonoscopy with 25 five to 25 mm polyps in descending colon, ascending, cecum. 2X3 cm carpet polyp in base of cecum lifted away s/p piecemeal polypectomy. Tubulovillous adenomas and tubular adenomas.Early interval colonoscopy has been recommended; however, at last visit, he was addressing multiple health issues. 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 Historically had been on Xarelto, but this has been on hold as of last Oncology visit 12/05/2018. Acute renal injury with creatinine 1.82 on admission, hypokalemia, in setting of poor oral intake due to odynophagia.   States he received radiation today and was told to come to the ED. Notes progressive dysphagia/odynophagia, with onset around time of radiation starting. Upper abdominal pain at times after eating, feeling food sitting heavy. Painful swallowing. Has tried magic mouthwash without much relief. No overt GI bleeding. Poor appetite. Weight 202 when last seen in July 2020. Today 199. Taken off Xarelto earlier this month.   Past Medical History:  Diagnosis Date  . CAD (coronary artery disease)    a. STEMI 04/2008 s/p BMS to prox & distal RCA, staged DES to LAD same admission. b. Nuc 03/2017: scar but no ischemia, EF 45-54%. c. 09/2017: NSTEMI - DES x 2 to proximal and mid-RCA  . Cancer ([place])   . CKD (chronic kidney disease), stage II   . Cough, persistent   . GERD (gastroesophageal reflux disease)   . History of BPH   . HTN (hypertension)   .  Hypercholesteremia   . LV dysfunction    a. EF 45-50% in 01/2016.  . Mild pulmonary hypertension ([place])   . PAF (paroxysmal atrial fibrillation) ([place])   . Port-A-Cath in place 11/21/2018  . Presence of permanent cardiac pacemaker   . Sinus node dysfunction ([place])    a. s/p StJude CRT-pacemaker 01/2016.    Past Surgical History:  Procedure Laterality Date  . CARDIAC CATHETERIZATION    . COLONOSCOPY    . COLONOSCOPY N/A 08/27/2017   Dr. [name]: 25 5-25 mm polyps in descending colon, ascending, cecum. 2X3 cm carpet polyp in base of cecum lifted away s/p piecemeal polypectomy. Tubulovillous adenoma and tubular adenomas  . CORONARY STENT INTERVENTION N/A 10/22/2017   Procedure: CORONARY STENT INTERVENTION;  Surgeon: [name], MD;  Location: [facility];  Service: Cardiovascular;  Laterality: N/A;  . CORONARY STENT PLACEMENT  04/2008   RCA and LAD  . EP IMPLANTABLE DEVICE N/A 01/09/2016   Procedure: BiV Pacemaker Insertion CRT-P;  Surgeon: [name], MD;  Location: [facility];  Service: Cardiovascular;  Laterality: N/A;  . ESOPHAGOGASTRODUODENOSCOPY N/A 08/27/2017   normal esophagus s/p dilation, gastric petechia, small hiatal hernia, normal duodenum  . INSERT / REPLACE / REMOVE PACEMAKER    . MALONEY DILATION N/A 08/27/2017   Procedure: [name] DILATION;  Surgeon: [name], MD;  [facility];  Service: Endoscopy;  Laterality: N/A;  . PORTACATH PLACEMENT Right 11/18/2018   Procedure: INSERTION PORT-A-CATH;  Surgeon: [name], MD;  Location: [facility];  Service: General;  Laterality: Right;  . RIGHT/LEFT HEART  CATH AND CORONARY ANGIOGRAPHY N/A 10/21/2017   Procedure: RIGHT/LEFT HEART CATH AND CORONARY ANGIOGRAPHY;  Surgeon: [name], MD;  Location: [facility];  Service: Cardiovascular;  Laterality: N/A;    Prior to Admission medications   Medication Sig Start Date End Date Taking? Authorizing Provider  acetaminophen (TYLENOL) 500 MG  tablet Take 500 mg by mouth every 6 (six) hours as needed for pain.    [provider]  aspirin EC 81 MG tablet Take 81 mg by mouth daily.    [provider]  CARBOPLATIN IV Inject into the vein once a week. 11/21/18   [provider]  cholecalciferol (VITAMIN D) 1000 units tablet Take 1,000 Units by mouth 2 (two) times daily.    [provider]  clopidogrel (PLAVIX) 75 MG tablet Take 75 mg by mouth daily.    [provider]  dorzolamide-timolol (COSOPT) 22.3-6.8 MG/ML ophthalmic solution Place 1 drop into the left eye 2 (two) times daily.    [provider]  ferrous sulfate 325 (65 FE) MG tablet Take 325 mg by mouth daily.     [provider]  hydrochlorothiazide (HYDRODIURIL) 25 MG tablet TAKE ONE (1) TABLET BY MOUTH EVERY DAY Patient taking differently: Take 25 mg by mouth daily.  03/10/18   Herminio Commons, MD  lidocaine-prilocaine (EMLA) cream Apply a dime-sized amount to port a cath site and cover with plastic wrap one hour prior to chemotherapy appointments 11/14/18   Derek Jack, MD  losartan (COZAAR) 25 MG tablet TAKE ONE TABLET BY [place] Patient taking differently: Take 25 mg by mouth daily.  05/26/18   Herminio Commons, MD  lovastatin (MEVACOR) 10 MG tablet TAKE ONE (1) TABLET BY MOUTH EVERY DAY 06/14/18   Herminio Commons, MD  metoprolol succinate (TOPROL XL) 25 MG 24 hr tablet Take 1 tablet (25 mg total) by mouth daily. 08/08/18   Herminio Commons, MD  ondansetron (ZOFRAN ODT) 8 MG disintegrating tablet Take 1 tablet (8 mg total) by mouth 2 (two) times daily as needed for nausea or vomiting. Patient not taking: Reported on 11/28/2018 11/22/18   Derek Jack, MD  PACLITAXEL IV Inject into the vein once a week. 11/21/18   [provider]  pantoprazole (PROTONIX) 40 MG tablet Take 40 mg by mouth daily.    [provider]  prochlorperazine (COMPAZINE) 10 MG tablet Take 1 tablet  (10 mg total) by mouth every 6 (six) hours as needed for nausea or vomiting. Patient not taking: Reported on 11/28/2018 11/14/18   Derek Jack, MD  Propylene Glycol (SYSTANE BALANCE OP) Place 1 drop into both eyes 2 (two) times daily as needed (dry eyes).     [provider]  scopolamine (TRANSDERM-SCOP) 1 MG/3DAYS Place 1 patch (1.5 mg total) onto the skin every 3 (three) days. Patient not taking: Reported on 11/28/2018 11/23/18   Derek Jack, MD  tamsulosin (FLOMAX) 0.4 MG CAPS capsule Take 0.4 mg by mouth daily.    [provider]  triamcinolone cream (KENALOG) 0.1 % Apply 1 application topically daily as needed (itching).     [provider]  vitamin B-12 (CYANOCOBALAMIN) 1000 MCG tablet Take 1,000 mcg by mouth daily.    [provider]    Current Facility-Administered Medications  Medication Dose Route Frequency Provider Last Rate Last Dose  . 0.9 %  sodium chloride infusion   Intravenous Continuous Manuella Ghazi, Pratik D, DO   Stopped at 12/12/18 1142  . Chlorhexidine  Gluconate Cloth 2 % PADS 6 each  6 each Topical Daily Heath Lark D, DO   6 each at 12/12/18 1419  . feeding supplement (ENSURE ENLIVE) (ENSURE ENLIVE) liquid 237 mL  237 mL Oral BID BM Shah, Pratik D, DO      . magic mouthwash w/lidocaine  10 mL Oral TID Manuella Ghazi, Pratik D, DO      . potassium chloride 10 mEq in 100 mL IVPB  10 mEq Intravenous Q1 Hr x 4 Shah, Pratik D, DO 100 mL/hr at 12/12/18 1402 10 mEq at 12/12/18 1402    Allergies as of 12/12/2018 - Review Complete 12/12/2018  Allergen Reaction Noted  . Ace inhibitors Nausea Only 07/18/2010  . Tape Rash 01/08/2016    Family History  Problem Relation Age of Onset  . Emphysema Mother        mother died with emphysema and cancer  . Cancer Mother   . Prostate cancer Brother   . Heart disease Brother   . Hypothyroidism Daughter   . Hypothyroidism Daughter   . Colon cancer Neg Hx   . Colon polyps Neg Hx     Social History    Socioeconomic History  . Marital status: Married    Spouse name: Air traffic controller  . Number of children: 3  . Years of education: Not on file  . Highest education level: Not on file  Occupational History  . Occupation: retired    Comment: Geographical information systems officer  . Financial resource strain: Not very hard  . Food insecurity    Worry: Never true    Inability: Never true  . Transportation needs    Medical: No    Non-medical: No  Tobacco Use  . Smoking status: Former Smoker    Packs/day: 2.50    Years: 25.00    Pack years: 62.50    Types: Cigarettes    Quit date: 06/14/1973    Years since quitting: 45.5  . Smokeless tobacco: Former Systems developer    Types: Chew    Quit date: 06/14/1973  . Tobacco comment: some/chewed very little for about 2 years  Substance and Sexual Activity  . Alcohol use: No  . Drug use: No  . Sexual activity: Not on file  Lifestyle  . Physical activity    Days per week: 0 days    Minutes per session: 0 min  . Stress: Not at all  Relationships  . Social connections    Talks on phone: More than three times a week    Gets together: More than three times a week    Attends religious service: More than 4 times per year    Active member of club or organization: No    Attends meetings of clubs or organizations: Never    Relationship status: Married  . Intimate partner violence    Fear of current or ex partner: No    Emotionally abused: No    Physically abused: No    Forced sexual activity: No  Other Topics Concern  . 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     Review of Systems: Gen: see HPI CV: Denies chest pain, heart palpitations, syncope, edema  Resp: +SOB GI: see HPI GU : Denies urinary burning, urinary frequency, urinary incontinence.  MS: Denies joint pain,swelling, cramping Derm: Denies rash, itching, dry skin Psych: Denies depression, anxiety,confusion, or memory loss Heme: see HPI  Physical Exam: Vital  signs in last 24 hours: Temp:  [  98.1 F (36.7 C)] 98.1 F (36.7 C) (10/12 0854) Pulse Rate:  [69-87] 69 (10/12 1349) Resp:  [17-34] 19 (10/12 1349) BP: (97-136)/(47-99) 113/47 (10/12 1349) SpO2:  [94 %-100 %] 100 % (10/12 1349) Weight:  [90.3 kg-90.4 kg] 90.4 kg (10/12 1347) Last BM Date: 12/12/18 General:   Alert,  Chronically ill-appearing Head:  Normocephalic and atraumatic. Eyes:  Sclera clear, no icterus.    Mouth:  Poor dentition. No obvious oral thrush Lungs:  Clear bilaterally Heart:  S1 S2 present without obvious murmurs Abdomen:  Soft, mild TTP upper abdomen and nondistended. No masses, hepatosplenomegaly or hernias noted. Normal bowel sounds, without guarding, and without rebound.   Rectal:  Deferred  Msk:  Symmetrical without gross deformities. Normal posture. Extremities:  Without  edema. Neurologic:  Alert and  oriented x4 Psych:  Alert and cooperative. Normal mood and affect.  Intake/Output from previous day: No intake/output data recorded. Intake/Output this shift: No intake/output data recorded.  Lab Results: Recent Labs    12/12/18 0930  WBC 2.6*  HGB 7.8*  HCT 23.8*  PLT 134*   BMET Recent Labs    12/12/18 0930  NA 133*  K 3.1*  CL 96*  CO2 23  GLUCOSE 116*  BUN 47*  CREATININE 1.82*  CALCIUM 8.9   LFT Recent Labs    12/12/18 0930  PROT 6.7  ALBUMIN 3.9  AST 24  ALT 35  ALKPHOS 49  BILITOT 2.6*    Studies/Results: Dg Chest Port 1 View  Result Date: 12/12/2018 CLINICAL DATA:  Cough, weakness. EXAM: PORTABLE CHEST 1 VIEW COMPARISON:  November 18, 2018. FINDINGS: Stable cardiomediastinal silhouette. Stable left-sided pacemaker. Stable right subclavian Port-A-Cath. No pneumothorax or pleural effusion is noted. Both lungs are clear. The visualized skeletal structures are unremarkable. IMPRESSION: No active disease. Electronically Signed   By: Marijo Conception M.D.   On: 12/12/2018 09:38    Impression: Very pleasant 85 year old male  diagnosed with squamous cell carcinoma of left lung, undergoing chemo and radiation. Now with odynophagia/dysphagia, dyspepsia with onset at time of starting radiation in Sept 2020, failing supportive measures including magic mouthwash. No obvious oral thrush on exam. Last EGD in June 2019 with normal esophagus s/p dilation, gastric petechia, normal duodenum. Known history of IDA. Colonoscopy also in June 2019 with 25 give to twenty-five mm polyps, carpet polyp (tubulovillous adenomas and tubular adenomas); he has not been able to pursue early interval colonoscopy due to cardiac issues, and at last visit was undergoing pulmonary evaluation, leading to lung cancer diagnosis.   Discussed with Dr. Oneida Alar and will treat empirically with Diflucan IV, starting with loading dose today. Spoke with pharmacist. With GFR under 50, will need to reduce dosage by half. Can adjust as appropriate once renal function improves. Diflucan 200 mg IV x 1 now, with 100 mg IV starting tomorrow. Will need EGD if no improvement with empiric treatment. Xarelto has been on hold since approximately 12/05/2018.     Plan: Diflucan 200 mg loading dose now, 100 mg IV starting tomorrow Monitor renal function: may be able to increase Diflucan dosing if returns to baseline PPI BID Full liquids Continue magic mouthwash Will need EGD if no significant improvement with supportive measures   Annitta Needs, PhD, ANP-BC [facility]     LOS: 0 days    12/12/2018, 2:33 PM

## 2018-12-12 NOTE — Progress Notes (Signed)
Pt c/o nausea and vomiting after eating two bites of ice cream, also with continued difficulty swallowing. Roseanne Kaufman, NP made aware, diet changed to sips of clears and ice chips until further plans are made. Will continue to monitor.

## 2018-12-12 NOTE — ED Notes (Signed)
Pt declines having to use restroom. Have given him 1 cup of water

## 2018-12-12 NOTE — ED Notes (Signed)
Pt in bathroom

## 2018-12-12 NOTE — ED Triage Notes (Signed)
Pt was sent by Dr in [place].  Pt is unsure what for, thinks it may be due to his bp.  Pt reports cough and dizziness.

## 2018-12-13 DIAGNOSIS — K219 Gastro-esophageal reflux disease without esophagitis: Secondary | ICD-10-CM | POA: Diagnosis not present

## 2018-12-13 DIAGNOSIS — T451X5A Adverse effect of antineoplastic and immunosuppressive drugs, initial encounter: Secondary | ICD-10-CM | POA: Diagnosis present

## 2018-12-13 DIAGNOSIS — N179 Acute kidney failure, unspecified: Secondary | ICD-10-CM | POA: Diagnosis not present

## 2018-12-13 DIAGNOSIS — N5089 Other specified disorders of the male genital organs: Secondary | ICD-10-CM | POA: Diagnosis present

## 2018-12-13 DIAGNOSIS — I495 Sick sinus syndrome: Secondary | ICD-10-CM | POA: Diagnosis present

## 2018-12-13 DIAGNOSIS — R1310 Dysphagia, unspecified: Secondary | ICD-10-CM

## 2018-12-13 DIAGNOSIS — N182 Chronic kidney disease, stage 2 (mild): Secondary | ICD-10-CM | POA: Diagnosis present

## 2018-12-13 DIAGNOSIS — K208 Other esophagitis without bleeding: Secondary | ICD-10-CM

## 2018-12-13 DIAGNOSIS — R1312 Dysphagia, oropharyngeal phase: Secondary | ICD-10-CM | POA: Diagnosis not present

## 2018-12-13 DIAGNOSIS — K259 Gastric ulcer, unspecified as acute or chronic, without hemorrhage or perforation: Secondary | ICD-10-CM | POA: Diagnosis not present

## 2018-12-13 DIAGNOSIS — T66XXXA Radiation sickness, unspecified, initial encounter: Secondary | ICD-10-CM | POA: Diagnosis not present

## 2018-12-13 DIAGNOSIS — D61810 Antineoplastic chemotherapy induced pancytopenia: Secondary | ICD-10-CM | POA: Diagnosis not present

## 2018-12-13 DIAGNOSIS — E860 Dehydration: Secondary | ICD-10-CM | POA: Diagnosis not present

## 2018-12-13 DIAGNOSIS — I10 Essential (primary) hypertension: Secondary | ICD-10-CM | POA: Diagnosis not present

## 2018-12-13 DIAGNOSIS — I129 Hypertensive chronic kidney disease with stage 1 through stage 4 chronic kidney disease, or unspecified chronic kidney disease: Secondary | ICD-10-CM | POA: Diagnosis present

## 2018-12-13 DIAGNOSIS — I2510 Atherosclerotic heart disease of native coronary artery without angina pectoris: Secondary | ICD-10-CM | POA: Diagnosis not present

## 2018-12-13 DIAGNOSIS — K2081 Other esophagitis with bleeding: Secondary | ICD-10-CM | POA: Diagnosis not present

## 2018-12-13 DIAGNOSIS — R2689 Other abnormalities of gait and mobility: Secondary | ICD-10-CM | POA: Diagnosis not present

## 2018-12-13 DIAGNOSIS — Z955 Presence of coronary angioplasty implant and graft: Secondary | ICD-10-CM | POA: Diagnosis not present

## 2018-12-13 DIAGNOSIS — E871 Hypo-osmolality and hyponatremia: Secondary | ICD-10-CM | POA: Diagnosis not present

## 2018-12-13 DIAGNOSIS — Z8042 Family history of malignant neoplasm of prostate: Secondary | ICD-10-CM | POA: Diagnosis not present

## 2018-12-13 DIAGNOSIS — E876 Hypokalemia: Secondary | ICD-10-CM | POA: Diagnosis not present

## 2018-12-13 DIAGNOSIS — D7589 Other specified diseases of blood and blood-forming organs: Secondary | ICD-10-CM | POA: Diagnosis present

## 2018-12-13 DIAGNOSIS — C3490 Malignant neoplasm of unspecified part of unspecified bronchus or lung: Secondary | ICD-10-CM

## 2018-12-13 DIAGNOSIS — Y842 Radiological procedure and radiotherapy as the cause of abnormal reaction of the patient, or of later complication, without mention of misadventure at the time of the procedure: Secondary | ICD-10-CM | POA: Diagnosis present

## 2018-12-13 DIAGNOSIS — E785 Hyperlipidemia, unspecified: Secondary | ICD-10-CM | POA: Diagnosis not present

## 2018-12-13 DIAGNOSIS — D509 Iron deficiency anemia, unspecified: Secondary | ICD-10-CM | POA: Diagnosis not present

## 2018-12-13 DIAGNOSIS — Z20828 Contact with and (suspected) exposure to other viral communicable diseases: Secondary | ICD-10-CM | POA: Diagnosis not present

## 2018-12-13 DIAGNOSIS — C3492 Malignant neoplasm of unspecified part of left bronchus or lung: Secondary | ICD-10-CM | POA: Diagnosis not present

## 2018-12-13 DIAGNOSIS — Z950 Presence of cardiac pacemaker: Secondary | ICD-10-CM | POA: Diagnosis not present

## 2018-12-13 DIAGNOSIS — K449 Diaphragmatic hernia without obstruction or gangrene: Secondary | ICD-10-CM | POA: Diagnosis not present

## 2018-12-13 DIAGNOSIS — M6281 Muscle weakness (generalized): Secondary | ICD-10-CM | POA: Diagnosis not present

## 2018-12-13 DIAGNOSIS — J449 Chronic obstructive pulmonary disease, unspecified: Secondary | ICD-10-CM | POA: Diagnosis present

## 2018-12-13 DIAGNOSIS — I252 Old myocardial infarction: Secondary | ICD-10-CM | POA: Diagnosis not present

## 2018-12-13 DIAGNOSIS — B3781 Candidal esophagitis: Secondary | ICD-10-CM | POA: Diagnosis not present

## 2018-12-13 DIAGNOSIS — K209 Esophagitis, unspecified without bleeding: Secondary | ICD-10-CM | POA: Diagnosis not present

## 2018-12-13 DIAGNOSIS — I480 Paroxysmal atrial fibrillation: Secondary | ICD-10-CM | POA: Diagnosis not present

## 2018-12-13 DIAGNOSIS — Z8719 Personal history of other diseases of the digestive system: Secondary | ICD-10-CM | POA: Diagnosis not present

## 2018-12-13 DIAGNOSIS — K2210 Ulcer of esophagus without bleeding: Secondary | ICD-10-CM | POA: Diagnosis not present

## 2018-12-13 LAB — CBC
HCT: 22.8 % — ABNORMAL LOW (ref 39.0–52.0)
Hemoglobin: 7.1 g/dL — ABNORMAL LOW (ref 13.0–17.0)
MCH: 31.4 pg (ref 26.0–34.0)
MCHC: 31.1 g/dL (ref 30.0–36.0)
MCV: 100.9 fL — ABNORMAL HIGH (ref 80.0–100.0)
Platelets: 132 10*3/uL — ABNORMAL LOW (ref 150–400)
RBC: 2.26 MIL/uL — ABNORMAL LOW (ref 4.22–5.81)
RDW: 15.7 % — ABNORMAL HIGH (ref 11.5–15.5)
WBC: 2.8 10*3/uL — ABNORMAL LOW (ref 4.0–10.5)
nRBC: 1.8 % — ABNORMAL HIGH (ref 0.0–0.2)

## 2018-12-13 LAB — BASIC METABOLIC PANEL
Anion gap: 12 (ref 5–15)
BUN: 44 mg/dL — ABNORMAL HIGH (ref 8–23)
CO2: 21 mmol/L — ABNORMAL LOW (ref 22–32)
Calcium: 8.5 mg/dL — ABNORMAL LOW (ref 8.9–10.3)
Chloride: 106 mmol/L (ref 98–111)
Creatinine, Ser: 1.48 mg/dL — ABNORMAL HIGH (ref 0.61–1.24)
GFR calc Af Amer: 50 mL/min — ABNORMAL LOW (ref >60)
GFR calc non Af Amer: 43 mL/min — ABNORMAL LOW (ref >60)
Glucose, Bld: 96 mg/dL (ref 70–99)
Potassium: 3.5 mmol/L (ref 3.5–5.1)
Sodium: 139 mmol/L (ref 135–145)

## 2018-12-13 LAB — PREPARE RBC (CROSSMATCH)

## 2018-12-13 LAB — MAGNESIUM: Magnesium: 1.9 mg/dL (ref 1.7–2.4)

## 2018-12-13 MED ORDER — SODIUM CHLORIDE 0.9% IV SOLUTION: Freq: Once | INTRAVENOUS | Status: AC

## 2018-12-13 MED ORDER — LORAZEPAM 2 MG/ML IJ SOLN
1.0000 mg | Freq: Once | INTRAMUSCULAR | Status: AC
  Administered 2018-12-14: 1 mg via INTRAVENOUS
  Filled 2018-12-13: qty 1

## 2018-12-13 MED ORDER — BOOST / RESOURCE BREEZE PO LIQD CUSTOM
1.0000 | Freq: Three times a day (TID) | ORAL | Status: DC
  Administered 2018-12-13 – 2018-12-24 (×13): 1 via ORAL
  Administered 2018-12-25: 09:00:00 237 mL via ORAL
  Administered 2018-12-25: 22:00:00 1 via ORAL

## 2018-12-13 MED ORDER — DIPHENHYDRAMINE HCL 25 MG PO CAPS: 25.0000 mg | ORAL_CAPSULE | Freq: Once | ORAL | Status: DC

## 2018-12-13 MED ORDER — ACETAMINOPHEN 325 MG PO TABS: 650.0000 mg | ORAL_TABLET | Freq: Once | ORAL | Status: DC

## 2018-12-13 NOTE — Progress Notes (Signed)
Subjective: He was admitted with weakness.  He has what appears to be esophagitis potentially from yeast but he is also receiving radiation treatments for lung cancer.  He is more anemic.  He has pancytopenia.  He was dehydrated on admission.  He is still having odynophagia  Objective: Vital signs in last 24 hours: Temp:  [97.8 F (36.6 C)-98.2 F (36.8 C)] 97.8 F (36.6 C) (10/13 0458) Pulse Rate:  [69-87] 70 (10/13 0838) Resp:  [17-34] 17 (10/13 0458) BP: (97-136)/(47-99) 124/65 (10/13 0838) SpO2:  [94 %-100 %] 97 % (10/13 0458) Weight:  [90.3 kg-90.4 kg] 90.4 kg (10/12 1347) Weight change:  Last BM Date: 12/12/18  Intake/Output from previous day: 10/12 0701 - 10/13 0700 In: 2350.3 [IV Piggyback:2350.3] Out: -   PHYSICAL EXAM General appearance: alert, cooperative and mild distress Resp: clear to auscultation bilaterally Cardio: regular rate and rhythm, S1, S2 normal, no murmur, click, rub or gallop GI: soft, non-tender; bowel sounds normal; no masses,  no organomegaly Extremities: extremities normal, atraumatic, no cyanosis or edema  Lab Results:  Results for orders placed or performed during the hospital encounter of 12/12/18 (from the past 48 hour(s))  Urinalysis, Routine w reflex microscopic     Status: Abnormal   Collection Time: 12/12/18  9:13 AM  Result Value Ref Range   Color, Urine AMBER (A) YELLOW    Comment: BIOCHEMICALS MAY BE AFFECTED BY COLOR   APPearance HAZY (A) CLEAR   Specific Gravity, Urine 1.023 1.005 - 1.030   pH 6.0 5.0 - 8.0   Glucose, UA NEGATIVE NEGATIVE mg/dL   Hgb urine dipstick NEGATIVE NEGATIVE   Bilirubin Urine NEGATIVE NEGATIVE   Ketones, ur NEGATIVE NEGATIVE mg/dL   Protein, ur 30 (A) NEGATIVE mg/dL   Nitrite NEGATIVE NEGATIVE   Leukocytes,Ua NEGATIVE NEGATIVE   RBC / HPF 0-5 0 - 5 RBC/hpf   WBC, UA 0-5 0 - 5 WBC/hpf   Bacteria, UA NONE SEEN NONE SEEN   Squamous Epithelial / LPF 0-5 0 - 5   Mucus PRESENT    Hyaline Casts, UA  PRESENT     Comment: Performed at [facility], [address] [place], [place] [address]  Comprehensive metabolic panel     Status: Abnormal   Collection Time: 12/12/18  9:30 AM  Result Value Ref Range   Sodium 133 (L) 135 - 145 mmol/L   Potassium 3.1 (L) 3.5 - 5.1 mmol/L   Chloride 96 (L) 98 - 111 mmol/L   CO2 23 22 - 32 mmol/L   Glucose, Bld 116 (H) 70 - 99 mg/dL   BUN 47 (H) 8 - 23 mg/dL   Creatinine, Ser 1.82 (H) 0.61 - 1.24 mg/dL   Calcium 8.9 8.9 - 10.3 mg/dL   Total Protein 6.7 6.5 - 8.1 g/dL   Albumin 3.9 3.5 - 5.0 g/dL   AST 24 15 - 41 U/L   ALT 35 0 - 44 U/L   Alkaline Phosphatase 49 38 - 126 U/L   Total Bilirubin 2.6 (H) 0.3 - 1.2 mg/dL   GFR calc non Af Amer 34 (L) >60 mL/min   GFR calc Af Amer 39 (L) >60 mL/min   Anion gap 14 5 - 15    Comment: Performed at [facility], [address], [place], [place] [address]  CBC with Differential/Platelet     Status: Abnormal   Collection Time: 12/12/18  9:30 AM  Result Value Ref Range   WBC 2.6 (L) 4.0 - 10.5 K/uL   RBC  2.46 (L) 4.22 - 5.81 MIL/uL   Hemoglobin 7.8 (L) 13.0 - 17.0 g/dL   HCT 23.8 (L) 39.0 - 52.0 %   MCV 96.7 80.0 - 100.0 fL   MCH 31.7 26.0 - 34.0 pg   MCHC 32.8 30.0 - 36.0 g/dL   RDW 14.6 11.5 - 15.5 %   Platelets 134 (L) 150 - 400 K/uL   nRBC 1.6 (H) 0.0 - 0.2 %   Neutrophils Relative % 83 %   Neutro Abs 2.1 1.7 - 7.7 K/uL   Lymphocytes Relative 4 %   Lymphs Abs 0.1 (L) 0.7 - 4.0 K/uL   Monocytes Relative 13 %   Monocytes Absolute 0.3 0.1 - 1.0 K/uL   Eosinophils Relative 0 %   Eosinophils Absolute 0.0 0.0 - 0.5 K/uL   Basophils Relative 0 %   Basophils Absolute 0.0 0.0 - 0.1 K/uL   Immature Granulocytes 0 %   Abs Immature Granulocytes 0.01 0.00 - 0.07 K/uL    Comment: Performed at [facility], [address] [place], [place] [address]  Troponin I (High Sensitivity)     Status: Abnormal   Collection Time: 12/12/18  9:30 AM  Result Value Ref Range   Troponin I (High Sensitivity) 43 (H) <18 ng/L     Comment: (NOTE) Elevated high sensitivity troponin I (hsTnI) values and significant  changes across serial measurements may suggest ACS but many other  chronic and acute conditions are known to elevate hsTnI results.  Refer to the "Links" section for chest pain algorithms and additional  guidance. Performed at [facility], [address], [place], [place] [address]   Magnesium     Status: None   Collection Time: 12/12/18 11:02 AM  Result Value Ref Range   Magnesium 1.7 1.7 - 2.4 mg/dL    Comment: Performed at [facility], [address] [place], [place] [address]  SARS CORONAVIRUS 2 (TAT 6-24 HRS) Nasopharyngeal Nasopharyngeal Swab     Status: None   Collection Time: 12/12/18 11:43 AM   Specimen: Nasopharyngeal Swab  Result Value Ref Range   SARS Coronavirus 2 NEGATIVE NEGATIVE    Comment: (NOTE) SARS-CoV-2 target nucleic acids are NOT DETECTED. The SARS-CoV-2 RNA is generally detectable in upper and lower respiratory specimens during the acute phase of infection. Negative results do not preclude SARS-CoV-2 infection, do not rule out co-infections with other pathogens, and should not be used as the sole basis for treatment or other patient management decisions. Negative results must be combined with clinical observations, patient history, and epidemiological information. The expected result is Negative. Fact Sheet for Patients: SugarRoll.be Fact Sheet for Healthcare Providers: https://www.woods-mathews.com/ This test is not yet approved or cleared by the [place] FDA and  has been authorized for detection and/or diagnosis of SARS-CoV-2 by FDA under an Emergency Use Authorization (EUA). This EUA will remain  in effect (meaning this test can be used) for the duration of the COVID-19 declaration under Section 56 4(b)(1) of the Act, 21 U.S.C. section 360bbb-3(b)(1), unless the authorization is terminated or revoked  sooner. Performed at [facility], [place] [address], [place], [place] [address]   Magnesium     Status: None   Collection Time: 12/13/18  5:36 AM  Result Value Ref Range   Magnesium 1.9 1.7 - 2.4 mg/dL    Comment: Performed at [facility], [address], [place], [place] [address]  Basic metabolic panel     Status: Abnormal   Collection Time: 12/13/18  5:36 AM  Result Value Ref Range   Sodium 139 135 - 145 mmol/L   Potassium 3.5 3.5 - 5.1 mmol/L   Chloride 106 98 - 111 mmol/L   CO2 21 (L) 22 - 32 mmol/L   Glucose, Bld 96 70 - 99 mg/dL   BUN 44 (H) 8 - 23 mg/dL   Creatinine, Ser 1.48 (H) 0.61 - 1.24 mg/dL   Calcium 8.5 (L) 8.9 - 10.3 mg/dL   GFR calc non Af Amer 43 (L) >60 mL/min   GFR calc Af Amer 50 (L) >60 mL/min   Anion gap 12 5 - 15    Comment: Performed at [facility], [address] [place], [place] [address]  CBC     Status: Abnormal   Collection Time: 12/13/18  5:36 AM  Result Value Ref Range   WBC 2.8 (L) 4.0 - 10.5 K/uL   RBC 2.26 (L) 4.22 - 5.81 MIL/uL   Hemoglobin 7.1 (L) 13.0 - 17.0 g/dL   HCT 22.8 (L) 39.0 - 52.0 %   MCV 100.9 (H) 80.0 - 100.0 fL   MCH 31.4 26.0 - 34.0 pg   MCHC 31.1 30.0 - 36.0 g/dL   RDW 15.7 (H) 11.5 - 15.5 %   Platelets 132 (L) 150 - 400 K/uL   nRBC 1.8 (H) 0.0 - 0.2 %    Comment: Performed at [facility], [address] [place], [place] [address]    ABGS No results for input(s): PHART, PO2ART, TCO2, HCO3 in the last 72 hours.  Invalid input(s): PCO2 CULTURES Recent Results (from the past 240 hour(s))  SARS CORONAVIRUS 2 (TAT 6-24 HRS) Nasopharyngeal Nasopharyngeal Swab     Status: None   Collection Time: 12/12/18 11:43 AM   Specimen: Nasopharyngeal Swab  Result Value Ref Range Status   SARS Coronavirus 2 NEGATIVE NEGATIVE Final    Comment: (NOTE) SARS-CoV-2 target nucleic acids are NOT DETECTED. The SARS-CoV-2 RNA is generally detectable in upper and lower respiratory specimens during the acute phase of infection.  Negative results do not preclude SARS-CoV-2 infection, do not rule out co-infections with other pathogens, and should not be used as the sole basis for treatment or other patient management decisions. Negative results must be combined with clinical observations, patient history, and epidemiological information. The expected result is Negative. Fact Sheet for Patients: SugarRoll.be Fact Sheet for Healthcare Providers: https://www.woods-mathews.com/ This test is not yet approved or cleared by the [place] FDA and  has been authorized for detection and/or diagnosis of SARS-CoV-2 by FDA under an Emergency Use Authorization (EUA). This EUA will remain  in effect (meaning this test can be used) for the duration of the COVID-19 declaration under Section 56 4(b)(1) of the Act, 21 U.S.C. section 360bbb-3(b)(1), unless the authorization is terminated or revoked sooner. Performed at [facility], [place] [address] [place], [place] [address]    Studies/Results: Dg Chest Port 1 View  Result Date: 12/12/2018 CLINICAL DATA:  Cough, weakness. EXAM: PORTABLE CHEST 1 VIEW COMPARISON:  November 18, 2018. FINDINGS: Stable cardiomediastinal silhouette. Stable left-sided pacemaker. Stable right subclavian Port-A-Cath. No pneumothorax or pleural effusion is noted. Both lungs are clear. The visualized skeletal structures are unremarkable. IMPRESSION: No active disease. Electronically Signed   By: Marijo Conception M.D.   On: 12/12/2018 09:38    Medications:  Prior to Admission:  Medications Prior to Admission  Medication Sig Dispense Refill Last Dose  . acetaminophen (TYLENOL) 500 MG tablet Take 500 mg by mouth every 6 (six) hours as  needed for pain.     [name] aspirin EC 81 MG tablet Take 81 mg by mouth daily.     [name] CARBOPLATIN IV Inject into the vein once a week.     . cholecalciferol (VITAMIN D) 1000 units tablet Take 1,000 Units by mouth 2 (two) times  daily.     . clopidogrel (PLAVIX) 75 MG tablet Take 75 mg by mouth daily.     . dorzolamide-timolol (COSOPT) 22.3-6.8 MG/ML ophthalmic solution Place 1 drop into the left eye 2 (two) times daily.     . ferrous sulfate 325 (65 FE) MG tablet Take 325 mg by mouth daily.      . hydrochlorothiazide (HYDRODIURIL) 25 MG tablet TAKE ONE (1) TABLET BY MOUTH EVERY DAY (Patient taking differently: Take 25 mg by mouth daily. ) 90 tablet 3   . lidocaine-prilocaine (EMLA) cream Apply a dime-sized amount to port a cath site and cover with plastic wrap one hour prior to chemotherapy appointments 30 g 0   . losartan (COZAAR) 25 MG tablet TAKE ONE TABLET BY MOUTY EVERY DAY (Patient taking differently: Take 25 mg by mouth daily. ) 90 tablet 1   . lovastatin (MEVACOR) 10 MG tablet TAKE ONE (1) TABLET BY MOUTH EVERY DAY 90 tablet 3   . metoprolol succinate (TOPROL XL) 25 MG 24 hr tablet Take 1 tablet (25 mg total) by mouth daily. 90 tablet 3   . ondansetron (ZOFRAN ODT) 8 MG disintegrating tablet Take 1 tablet (8 mg total) by mouth 2 (two) times daily as needed for nausea or vomiting. (Patient not taking: Reported on 11/28/2018) 60 tablet 3   . PACLITAXEL IV Inject into the vein once a week.     . pantoprazole (PROTONIX) 40 MG tablet Take 40 mg by mouth daily.     . prochlorperazine (COMPAZINE) 10 MG tablet Take 1 tablet (10 mg total) by mouth every 6 (six) hours as needed for nausea or vomiting. (Patient not taking: Reported on 11/28/2018) 30 tablet 1   . Propylene Glycol (SYSTANE BALANCE OP) Place 1 drop into both eyes 2 (two) times daily as needed (dry eyes).      [name] scopolamine (TRANSDERM-SCOP) 1 MG/3DAYS Place 1 patch (1.5 mg total) onto the skin every 3 (three) days. (Patient not taking: Reported on 11/28/2018) 10 patch 12   . tamsulosin (FLOMAX) 0.4 MG CAPS capsule Take 0.4 mg by mouth daily.     [name] triamcinolone cream (KENALOG) 0.1 % Apply 1 application topically daily as needed (itching).      . vitamin B-12  (CYANOCOBALAMIN) 1000 MCG tablet Take 1,000 mcg by mouth daily.      Scheduled: . aspirin EC  81 mg Oral Daily  . Chlorhexidine Gluconate Cloth  6 each Topical Daily  . cholecalciferol  1,000 Units Oral BID  . dorzolamide-timolol  1 drop Left Eye BID  . feeding supplement (ENSURE ENLIVE)  237 mL Oral BID BM  . ferrous sulfate  325 mg Oral Q breakfast  . magic mouthwash w/lidocaine  10 mL Oral TID  . metoprolol succinate  25 mg Oral Daily  . oxyCODONE-acetaminophen  1 tablet Oral TID  . pantoprazole (PROTONIX) IV  40 mg Intravenous Q12H  . tamsulosin  0.4 mg Oral Daily  . vitamin B-12  1,000 mcg Oral Daily   Continuous: . sodium chloride 100 mL/hr at 12/13/18 0536  . fluconazole (DIFLUCAN) IV     QAS:[id number], ondansetron **OR** ondansetron (ZOFRAN) IV  Assesment: He has weakness which is  multifactorial.  He has had trouble with nausea and vomiting and I think he has been dehydrated  He has odynophagia which may be related to yeast or to his radiation treatment or to both  He has lung cancer which is being treated  He has pancytopenia and I am going to go and given 1 unit of packed red blood cells Active Problems:   Weakness   Odynophagia    Plan: Continue treatments.  Decrease IV fluids.  He will have a unit of blood.    LOS: 0 days   Nicholas Sanchez 12/13/2018, 8:52 AM

## 2018-12-13 NOTE — Plan of Care (Signed)
  Problem: Acute Rehab PT Goals(only PT should resolve) Goal: Pt Will Go Supine/Side To Sit Outcome: Progressing Flowsheets (Taken 12/13/2018 1354) Pt will go Supine/Side to Sit: Independently Goal: Patient Will Transfer Sit To/From Stand Outcome: Progressing Flowsheets (Taken 12/13/2018 1354) Patient will transfer sit to/from stand: with supervision Goal: Pt Will Transfer Bed To Chair/Chair To Bed Outcome: Progressing Flowsheets (Taken 12/13/2018 1354) Pt will Transfer Bed to Chair/Chair to Bed: with supervision Goal: Pt Will Ambulate Outcome: Progressing Flowsheets (Taken 12/13/2018 1354) Pt will Ambulate:  50 feet  with supervision  with rolling walker   1:55 PM, 12/13/18 Lonell Grandchild, MPT Physical Therapist with [facility] [phone] office [phone] mobile phone

## 2018-12-13 NOTE — Evaluation (Signed)
Physical Therapy Evaluation Patient Details Name: Nicholas Sanchez MRN: [id number] DOB: [birth year] Today's Date: 12/13/2018   History of Present Illness  Nicholas Sanchez is a 85 y.o. male with medical history significant for stage III squamous cell lung cancer currently on radiation treatment, CAD with prior MI, paroxysmal atrial fibrillation currently off Xarelto, hypertension, GERD, and recent weight loss who presented to the ED at the urging of his oncologist due to worsening dehydration and soft blood pressure readings.  He has had some generalized worsening weakness over the last 1 week and has had poor appetite as well as reduced p.o. intake on account of odynophagia.  He is also had some mild epigastric abdominal tenderness, but no nausea, vomiting, or diarrhea.  No fevers or chills noted.  He denies any chest pain, cough, shortness of breathing, or hemoptysis.  He states that he tries to take his Magic mouthwash at home on account of his ongoing symptoms, but this has not been helping very much.    Clinical Impression  Patient demonstrates good return for getting into/out of bed, has most difficulty with sit to stands requiring repeated verbal cueing for proper hand placement with fair carryover, limited to ambulation in room due to c/o fatigue and generalized weakness.  Patient tolerated sitting up in chair after therapy.  Patient will benefit from continued physical therapy in hospital and recommended venue below to increase strength, balance, endurance for safe ADLs and gait.    Follow Up Recommendations SNF;Supervision for mobility/OOB;Supervision - Intermittent    Equipment Recommendations  None recommended by PT    Recommendations for Other Services       Precautions / Restrictions Precautions Precautions: Fall Restrictions Weight Bearing Restrictions: No      Mobility  Bed Mobility Overal bed mobility: Modified Independent                Transfers Overall  transfer level: Needs assistance Equipment used: Rolling walker (2 wheeled) Transfers: Sit to/from [employer] Transfers Sit to Stand: Min assist;Mod assist Stand pivot transfers: Min assist       General transfer comment: required repeated verbal cueing for proper hand placement during sit to stands  Ambulation/Gait Ambulation/Gait assistance: Min assist Gait Distance (Feet): 18 Feet Assistive device: Rolling walker (2 wheeled) Gait Pattern/deviations: Decreased step length - right;Decreased step length - left;Decreased stride length Gait velocity: slow   General Gait Details: slow labored cadence with shaking of BUE, limited secondary to c/o fatigue, no loss of balance  Stairs            Wheelchair Mobility    Modified Rankin (Stroke Patients Only)       Balance Overall balance assessment: Needs assistance Sitting-balance support: Feet supported;No upper extremity supported Sitting balance-Leahy Scale: Good Sitting balance - Comments: seated at bedside   Standing balance support: During functional activity;Bilateral upper extremity supported Standing balance-Leahy Scale: Fair Standing balance comment: using RW                             Pertinent Vitals/Pain Pain Assessment: No/denies pain    Home Living Family/patient expects to be discharged to:: Private residence Living Arrangements: Spouse/significant other Available Help at Discharge: Family;Available PRN/intermittently Type of Home: House Home Access: Stairs to enter Entrance Stairs-Rails: Right;Left;Can reach both Entrance Stairs-Number of Steps: 3-4 Home Layout: One level Home Equipment: Cane - single point;Walker - 2 wheels;Bedside commode;Crutches      Prior Function Level of  Independence: Independent with assistive device(s)         Comments: household and short distanced community ambulator with [facility]     Hand Dominance        Extremity/Trunk Assessment   Upper  Extremity Assessment Upper Extremity Assessment: Generalized weakness    Lower Extremity Assessment Lower Extremity Assessment: Generalized weakness    Cervical / Trunk Assessment Cervical / Trunk Assessment: Normal  Communication   Communication: No difficulties  Cognition Arousal/Alertness: Awake/alert Behavior During Therapy: WFL for tasks assessed/performed Overall Cognitive Status: Within Functional Limits for tasks assessed                                        General Comments      Exercises     Assessment/Plan    PT Assessment Patient needs continued PT services  PT Problem List Decreased strength;Decreased activity tolerance;Decreased balance;Decreased mobility       PT Treatment Interventions Gait training;Stair training;Functional mobility training;Therapeutic activities;Therapeutic exercise;Patient/family education;Balance training    PT Goals (Current goals can be found in the Care Plan section)  Acute Rehab PT Goals Patient Stated Goal: return home with family to assist PT Goal Formulation: With patient Time For Goal Achievement: 12/27/18 Potential to Achieve Goals: Good    Frequency Min 3X/week   Barriers to discharge        Co-evaluation               AM-PAC PT "6 Clicks" Mobility  Outcome Measure Help needed turning from your back to your side while in a flat bed without using bedrails?: None Help needed moving from lying on your back to sitting on the side of a flat bed without using bedrails?: None Help needed moving to and from a bed to a chair (including a wheelchair)?: A Little Help needed standing up from a chair using your arms (e.g., wheelchair or bedside chair)?: A Little Help needed to walk in hospital room?: A Little Help needed climbing 3-5 steps with a railing? : A Lot 6 Click Score: 19    End of Session   Activity Tolerance: Patient tolerated treatment well;Patient limited by fatigue Patient left: in  chair;with call bell/phone within reach Nurse Communication: Mobility status PT Visit Diagnosis: Unsteadiness on feet (R26.81);Other abnormalities of gait and mobility (R26.89);Muscle weakness (generalized) (M62.81)    Time: 2355-7322 PT Time Calculation (min) (ACUTE ONLY): 26 min   Charges:   PT Evaluation $PT Eval Moderate Complexity: 1 Mod PT Treatments $Therapeutic Activity: 23-37 mins        1:47 PM, 12/13/18 Lonell Grandchild, MPT Physical Therapist with [facility] [phone] office [phone] mobile phone

## 2018-12-13 NOTE — TOC Initial Note (Addendum)
Transition of Care [facility]) - Initial/Assessment Note    Patient Details  Name: Nicholas Sanchez MRN: [id number] Date of Birth: 1934/02/11  Transition [facility]) CM/SW Contact:    Katheleen Stella, Chauncey [place], RN Phone Number: 12/13/2018, 12:49 PM  Clinical Narrative:   Patient tired today. Short assessment completed.  85 year old male with medical history significant for lung cancer, currently on radiation treatment, CAD with prior MI, permanent pacemaker 2017, A-fib, HTN, GERD, recent wt loss.   Suspected esophagitis. Lives at home with wife and has a daughter in the area as well.  He uses a cane and has RW.   Unsure if he will accept home health at this time.   Has PCP, transportation, and no issues obtaining medications.   ADDENDUM: PT recommending SNF vs home with supervision. Discussed with patient. He prefers to go home with wife and is considering home health PT. He has not yet had his PRBC prior to PT assessment, he thinks he may feel stronger after that. TOC team to follow up with patient tomorrow.            Expected Discharge Plan: Home/Self Care Barriers to Discharge: Continued Medical Work up   Patient Goals and CMS Choice Patient states their goals for this hospitalization and ongoing recovery are:: return home      Expected Discharge Plan and Services Expected Discharge Plan: Home/Self Care   Discharge Planning Services: CM Consult   Living arrangements for the past 2 months: Single Family Home                                      Prior Living Arrangements/Services Living arrangements for the past 2 months: Single Family Home Lives with:: Spouse              Current home services: DME(cane, RW)    Activities of Daily Living Home Assistive Devices/Equipment: Eyeglasses, Dentures (specify type), Cane (specify quad or straight), Crutches, Walker (specify type) ADL Screening (condition at time of admission) Patient's cognitive ability adequate to  safely complete daily activities?: Yes Is the patient deaf or have difficulty hearing?: No Does the patient have difficulty seeing, even when wearing glasses/contacts?: No Does the patient have difficulty concentrating, remembering, or making decisions?: No Patient able to express need for assistance with ADLs?: Yes Does the patient have difficulty dressing or bathing?: Yes Independently performs ADLs?: Yes (appropriate for developmental age) Does the patient have difficulty walking or climbing stairs?: No Weakness of Legs: None Weakness of Arms/Hands: None   Emotional Assessment   Attitude/Demeanor/Rapport: Engaged Affect (typically observed): Accepting Orientation: : Oriented to Self, Oriented to Place, Oriented to  Time Alcohol / Substance Use: Not Applicable Psych Involvement: No (comment)  Admission diagnosis:  Dehydration [E86.0] Hypokalemia [E87.6] AKI (acute kidney injury) ([place]) [N17.9] Squamous cell carcinoma of lung, unspecified laterality ([place]) [C34.90] Patient Active Problem List   Diagnosis Date Noted  . Weakness 12/12/2018  . Odynophagia   . Neutropenia, drug-induced ([place]) 12/05/2018  . Port-A-Cath in place 11/21/2018  . Hemoptysis 11/16/2018  . Anticoagulant long-term use 11/16/2018  . Hyponatremia 11/16/2018  . Goals of care, counseling/discussion 11/16/2018  . Squamous cell carcinoma of lung, left ([place]) 11/09/2018  . Mass of lower lobe of left lung 10/26/2018  . Hx of adenomatous colonic polyps 11/09/2017  . Angina at rest [facility]) 10/20/2017  . NSTEMI (non-ST elevated myocardial infarction) ([place]) 10/20/2017  .  IDA (iron deficiency anemia) 08/04/2017  . Dysphagia 08/04/2017  . Paroxysmal atrial fibrillation ([place]) 01/21/2016  . Heart block 01/08/2016  . CAD (coronary artery disease)   . HTN (hypertension)   . Hypercholesteremia   . GERD (gastroesophageal reflux disease)    PCP:  Sinda Du, MD Pharmacy:   [facility], [place] [address] Phone: [phone] Fax: [phone]  [facility], [place] [address] Phone: [phone] Fax: [phone]  [id number] [place], [address] [place] AT [facility] [place] [address] Phone: [phone] Fax: [phone]     Social Determinants of Health (SDOH) Interventions    Readmission Risk Interventions Readmission Risk Prevention Plan 12/13/2018  Transportation Screening Complete  Social Work Consult for [place] Planning/Counseling Complete  Medication Review Press photographer) Complete  Some recent data might be hidden

## 2018-12-13 NOTE — Consult Note (Signed)
[facility] Consultation Oncology  Name: Nicholas Sanchez      MRN: [id number]    Location: J884/Z660-63  Date: 12/13/2018 Time:6:31 PM   REFERRING PHYSICIAN: Dr. Manuella Ghazi.  REASON FOR CONSULT: Treatment related complications from chemoradiation therapy for stage III squamous cell lung cancer.   DIAGNOSIS: Radiation and chemotherapy-induced esophagitis  HISTORY OF PRESENT ILLNESS: Nicholas Sanchez is 85 year old very pleasant white male known to me from office visits.  He was diagnosed with squamous cell lung cancer on 10/31/2018 when he presented with 26 pound weight loss in the last 1 year.  He was initiated on weekly carboplatin and paclitaxel from 11/21/2018 along with radiation.  Last treatment was on 12/11/2018.  He felt very weak and his blood pressure was low when he presented to radiation therapy in [place] on 12/12/2018.  He was directed to the ER.  He reports that he had been having trouble swallowing starting Saturday.  He also reports painful swallowing.  He has not eaten much.  He was found to have renal failure on admission.  He is using Magic mouthwash but it only helps for 15 minutes.  Denies any bleeding per rectum or melena.  He was given IV hydration and his losartan/HCTZ was held.  He was also started on IV Diflucan for possible Candida esophagitis.  He did not have any fevers or chills.  Denies any tingling or numbness in the extremities.  PAST MEDICAL HISTORY:   Past Medical History:  Diagnosis Date  . CAD (coronary artery disease)    a. STEMI 04/2008 s/p BMS to prox & distal RCA, staged DES to LAD same admission. b. Nuc 03/2017: scar but no ischemia, EF 45-54%. c. 09/2017: NSTEMI - DES x 2 to proximal and mid-RCA  . Cancer ([place])   . CKD (chronic kidney disease), stage II   . Cough, persistent   . GERD (gastroesophageal reflux disease)   . History of BPH   . HTN (hypertension)   . Hypercholesteremia   . LV dysfunction    a. EF 45-50% in 01/2016.  . Mild pulmonary  hypertension ([place])   . PAF (paroxysmal atrial fibrillation) ([place])   . Port-A-Cath in place 11/21/2018  . Presence of permanent cardiac pacemaker   . Sinus node dysfunction ([place])    a. s/p StJude CRT-pacemaker 01/2016.    ALLERGIES: Allergies  Allergen Reactions  . Ace Inhibitors Nausea Only  . Tape Rash    And Bandaids, too      MEDICATIONS: I have reviewed the patient's current medications.     PAST SURGICAL HISTORY Past Surgical History:  Procedure Laterality Date  . CARDIAC CATHETERIZATION    . COLONOSCOPY    . COLONOSCOPY N/A 08/27/2017   Dr. [name]: 25 5-25 mm polyps in descending colon, ascending, cecum. 2X3 cm carpet polyp in base of cecum lifted away s/p piecemeal polypectomy. Tubulovillous adenoma and tubular adenomas  . CORONARY STENT INTERVENTION N/A 10/22/2017   Procedure: CORONARY STENT INTERVENTION;  Surgeon: [name], MD;  Location: [facility];  Service: Cardiovascular;  Laterality: N/A;  . CORONARY STENT PLACEMENT  04/2008   RCA and LAD  . EP IMPLANTABLE DEVICE N/A 01/09/2016   Procedure: BiV Pacemaker Insertion CRT-P;  Surgeon: [name], MD;  Location: [facility];  Service: Cardiovascular;  Laterality: N/A;  . ESOPHAGOGASTRODUODENOSCOPY N/A 08/27/2017   normal esophagus s/p dilation, gastric petechia, small hiatal hernia, normal duodenum  . INSERT / REPLACE / REMOVE PACEMAKER    .  MALONEY DILATION N/A 08/27/2017   Procedure: [name] DILATION;  Surgeon: [name], MD;  [facility];  Service: Endoscopy;  Laterality: N/A;  . PORTACATH PLACEMENT Right 11/18/2018   Procedure: INSERTION PORT-A-CATH;  Surgeon: [name], MD;  Location: [facility];  Service: General;  Laterality: Right;  . RIGHT/LEFT HEART CATH AND CORONARY ANGIOGRAPHY N/A 10/21/2017   Procedure: RIGHT/LEFT HEART CATH AND CORONARY ANGIOGRAPHY;  Surgeon: [name], MD;  Location: [facility];  Service: Cardiovascular;  Laterality: N/A;    FAMILY  HISTORY: Family History  Problem Relation Age of Onset  . Emphysema Mother        mother died with emphysema and cancer  . Cancer Mother   . Prostate cancer Brother   . Heart disease Brother   . Hypothyroidism Daughter   . Hypothyroidism Daughter   . Colon cancer Neg Hx   . Colon polyps Neg Hx     SOCIAL HISTORY:  reports that he quit smoking about 45 years ago. His smoking use included cigarettes. He has a 62.50 pack-year smoking history. He quit smokeless tobacco use about 45 years ago.  His smokeless tobacco use included chew. He reports that he does not drink alcohol or use drugs.  PERFORMANCE STATUS: The patient's performance status is 2 - Symptomatic, <50% confined to bed  PHYSICAL EXAM: Most Recent Vital Signs: Blood pressure (!) 118/47, pulse 69, temperature 98.4 F (36.9 C), temperature source Oral, resp. rate 18, height 6\' 1"  (1.854 m), weight 199 lb 4.7 oz (90.4 kg), SpO2 98 %. BP (!) 118/47   Pulse 69   Temp 98.4 F (36.9 C) (Oral)   Resp 18   Ht 6\' 1"  (1.854 m)   Wt 199 lb 4.7 oz (90.4 kg)   SpO2 98%   BMI 26.29 kg/m  General appearance: alert, cooperative and appears stated age Head: Normocephalic, without obvious abnormality, atraumatic Lungs: clear to auscultation bilaterally Heart: regular rate and rhythm Abdomen: soft, non-tender; bowel sounds normal; no masses,  no organomegaly Extremities: extremities normal, atraumatic, no cyanosis or edema Skin: Skin color, texture, turgor normal. No rashes or lesions Lymph nodes: Cervical, supraclavicular, and axillary nodes normal. Neurologic: Grossly normal  LABORATORY DATA:  Results for orders placed or performed during the hospital encounter of 12/12/18 (from the past 48 hour(s))  Urinalysis, Routine w reflex microscopic     Status: Abnormal   Collection Time: 12/12/18  9:13 AM  Result Value Ref Range   Color, Urine AMBER (A) YELLOW    Comment: BIOCHEMICALS MAY BE AFFECTED BY COLOR   APPearance HAZY (A)  CLEAR   Specific Gravity, Urine 1.023 1.005 - 1.030   pH 6.0 5.0 - 8.0   Glucose, UA NEGATIVE NEGATIVE mg/dL   Hgb urine dipstick NEGATIVE NEGATIVE   Bilirubin Urine NEGATIVE NEGATIVE   Ketones, ur NEGATIVE NEGATIVE mg/dL   Protein, ur 30 (A) NEGATIVE mg/dL   Nitrite NEGATIVE NEGATIVE   Leukocytes,Ua NEGATIVE NEGATIVE   RBC / HPF 0-5 0 - 5 RBC/hpf   WBC, UA 0-5 0 - 5 WBC/hpf   Bacteria, UA NONE SEEN NONE SEEN   Squamous Epithelial / LPF 0-5 0 - 5   Mucus PRESENT    Hyaline Casts, UA PRESENT     Comment: Performed at [facility], [address] [place], [place] [address]  Comprehensive metabolic panel     Status: Abnormal   Collection Time: 12/12/18  9:30 AM  Result Value Ref Range   Sodium 133 (  L) 135 - 145 mmol/L   Potassium 3.1 (L) 3.5 - 5.1 mmol/L   Chloride 96 (L) 98 - 111 mmol/L   CO2 23 22 - 32 mmol/L   Glucose, Bld 116 (H) 70 - 99 mg/dL   BUN 47 (H) 8 - 23 mg/dL   Creatinine, Ser 1.82 (H) 0.61 - 1.24 mg/dL   Calcium 8.9 8.9 - 10.3 mg/dL   Total Protein 6.7 6.5 - 8.1 g/dL   Albumin 3.9 3.5 - 5.0 g/dL   AST 24 15 - 41 U/L   ALT 35 0 - 44 U/L   Alkaline Phosphatase 49 38 - 126 U/L   Total Bilirubin 2.6 (H) 0.3 - 1.2 mg/dL   GFR calc non Af Amer 34 (L) >60 mL/min   GFR calc Af Amer 39 (L) >60 mL/min   Anion gap 14 5 - 15    Comment: Performed at [facility], [address], [place], [place] [address]  CBC with Differential/Platelet     Status: Abnormal   Collection Time: 12/12/18  9:30 AM  Result Value Ref Range   WBC 2.6 (L) 4.0 - 10.5 K/uL   RBC 2.46 (L) 4.22 - 5.81 MIL/uL   Hemoglobin 7.8 (L) 13.0 - 17.0 g/dL   HCT 23.8 (L) 39.0 - 52.0 %   MCV 96.7 80.0 - 100.0 fL   MCH 31.7 26.0 - 34.0 pg   MCHC 32.8 30.0 - 36.0 g/dL   RDW 14.6 11.5 - 15.5 %   Platelets 134 (L) 150 - 400 K/uL   nRBC 1.6 (H) 0.0 - 0.2 %   Neutrophils Relative % 83 %   Neutro Abs 2.1 1.7 - 7.7 K/uL   Lymphocytes Relative 4 %   Lymphs Abs 0.1 (L) 0.7 - 4.0 K/uL   Monocytes Relative 13 %    Monocytes Absolute 0.3 0.1 - 1.0 K/uL   Eosinophils Relative 0 %   Eosinophils Absolute 0.0 0.0 - 0.5 K/uL   Basophils Relative 0 %   Basophils Absolute 0.0 0.0 - 0.1 K/uL   Immature Granulocytes 0 %   Abs Immature Granulocytes 0.01 0.00 - 0.07 K/uL    Comment: Performed at [facility], [address], [place], [place] [address]  Troponin I (High Sensitivity)     Status: Abnormal   Collection Time: 12/12/18  9:30 AM  Result Value Ref Range   Troponin I (High Sensitivity) 43 (H) <18 ng/L    Comment: (NOTE) Elevated high sensitivity troponin I (hsTnI) values and significant  changes across serial measurements may suggest ACS but many other  chronic and acute conditions are known to elevate hsTnI results.  Refer to the "Links" section for chest pain algorithms and additional  guidance. Performed at [facility], [address] [place], [place] [address]   Magnesium     Status: None   Collection Time: 12/12/18 11:02 AM  Result Value Ref Range   Magnesium 1.7 1.7 - 2.4 mg/dL    Comment: Performed at [facility], [address], [place], [place] [address]  SARS CORONAVIRUS 2 (TAT 6-24 HRS) Nasopharyngeal Nasopharyngeal Swab     Status: None   Collection Time: 12/12/18 11:43 AM   Specimen: Nasopharyngeal Swab  Result Value Ref Range   SARS Coronavirus 2 NEGATIVE NEGATIVE    Comment: (NOTE) SARS-CoV-2 target nucleic acids are NOT DETECTED. The SARS-CoV-2 RNA is generally detectable in upper and lower respiratory specimens during the acute phase of infection. Negative results do not preclude SARS-CoV-2 infection, do not  rule out co-infections with other pathogens, and should not be used as the sole basis for treatment or other patient management decisions. Negative results must be combined with clinical observations, patient history, and epidemiological information. The expected result is Negative. Fact Sheet for Patients: SugarRoll.be Fact Sheet for  Healthcare Providers: https://www.woods-mathews.com/ This test is not yet approved or cleared by the [place] FDA and  has been authorized for detection and/or diagnosis of SARS-CoV-2 by FDA under an Emergency Use Authorization (EUA). This EUA will remain  in effect (meaning this test can be used) for the duration of the COVID-19 declaration under Section 56 4(b)(1) of the Act, 21 U.S.C. section 360bbb-3(b)(1), unless the authorization is terminated or revoked sooner. Performed at [facility], [place] [address], [place], [place] [address]   Magnesium     Status: None   Collection Time: 12/13/18  5:36 AM  Result Value Ref Range   Magnesium 1.9 1.7 - 2.4 mg/dL    Comment: Performed at [facility], [address], [place], [place] [address]  Basic metabolic panel     Status: Abnormal   Collection Time: 12/13/18  5:36 AM  Result Value Ref Range   Sodium 139 135 - 145 mmol/L   Potassium 3.5 3.5 - 5.1 mmol/L   Chloride 106 98 - 111 mmol/L   CO2 21 (L) 22 - 32 mmol/L   Glucose, Bld 96 70 - 99 mg/dL   BUN 44 (H) 8 - 23 mg/dL   Creatinine, Ser 1.48 (H) 0.61 - 1.24 mg/dL   Calcium 8.5 (L) 8.9 - 10.3 mg/dL   GFR calc non Af Amer 43 (L) >60 mL/min   GFR calc Af Amer 50 (L) >60 mL/min   Anion gap 12 5 - 15    Comment: Performed at [facility], [address], [place], [place] [address]  CBC     Status: Abnormal   Collection Time: 12/13/18  5:36 AM  Result Value Ref Range   WBC 2.8 (L) 4.0 - 10.5 K/uL   RBC 2.26 (L) 4.22 - 5.81 MIL/uL   Hemoglobin 7.1 (L) 13.0 - 17.0 g/dL   HCT 22.8 (L) 39.0 - 52.0 %   MCV 100.9 (H) 80.0 - 100.0 fL   MCH 31.4 26.0 - 34.0 pg   MCHC 31.1 30.0 - 36.0 g/dL   RDW 15.7 (H) 11.5 - 15.5 %   Platelets 132 (L) 150 - 400 K/uL   nRBC 1.8 (H) 0.0 - 0.2 %    Comment: Performed at [facility], [address], [place], [place] [address]  Type and screen [facility]     Status: None   Collection Time: 12/13/18 10:23 AM  Result Value Ref  Range   ABO/RH(D) A POS    Antibody Screen POS    Sample Expiration      12/16/2018,2359 Performed at [facility], [address], [place], [place] [address]   Prepare RBC     Status: None   Collection Time: 12/13/18 10:23 AM  Result Value Ref Range   Order Confirmation      ORDER PROCESSED BY BLOOD BANK Performed at [facility], [address] [place], [place] [address]       RADIOGRAPHY: Dg Chest Port 1 View  Result Date: 12/12/2018 CLINICAL DATA:  Cough, weakness. EXAM: PORTABLE CHEST 1 VIEW COMPARISON:  November 18, 2018. FINDINGS: Stable cardiomediastinal silhouette. Stable left-sided pacemaker. Stable right subclavian Port-A-Cath. No pneumothorax or pleural effusion is noted. Both lungs are  clear. The visualized skeletal structures are unremarkable. IMPRESSION: No active disease. Electronically Signed   By: Marijo Conception M.D.   On: 12/12/2018 09:38        ASSESSMENT and PLAN:  1.  Radiation esophagitis: - He has difficulty swallowing and painful swallowing of both liquids and solids. - Continue Magic mouthwash with lidocaine prior to eating and drinking. - Continue Diflucan. -EGD per GI if current treatment does not improve symptoms.  2.  Acute kidney injury: - His creatinine improved to 1.48 with IV hydration today. - Continue gentle hydration until his creatinine denies back to his baseline.  3.  Anemia: -Bone marrow suppression from chemotherapy and dilution from IV fluids. -She will receive 1 unit of PRBC today.  4.  Stage III left lung squamous cell carcinoma: - Chemoradiation therapy with weekly carboplatin and paclitaxel started on 11/21/2018, last dose on 12/11/2018.  5.  Paroxysmal atrial fibrillation: -Xarelto on hold secondary to hemoptysis.  6.  Leukopenia: -White count is 2.8.  ANC yesterday was 2100. - If ANC drops below 1500, consider G-CSF.  All questions were answered. The patient knows to call the clinic with any problems, questions or  concerns. We can certainly see the patient much sooner if necessary.    Derek Jack

## 2018-12-13 NOTE — Progress Notes (Signed)
Initial Nutrition Assessment  DOCUMENTATION CODES:   Not applicable  INTERVENTION:  Discontinue Ensure d/t CL diet order  -Boost Breeze po TID, each supplement provides 250 kcal and 9 grams of protein  NUTRITION DIAGNOSIS:   Increased nutrient needs(calories/protein) related to cancer and cancer related treatments as evidenced by estimated needs.   GOAL:   Patient will meet greater than or equal to 90% of their needs   MONITOR:   PO intake, Diet advancement, Labs, Weight trends, I & O's, Supplement acceptance  REASON FOR ASSESSMENT:   Malnutrition Screening Tool    ASSESSMENT:  85 year old male with medical history significant for lung cancer, currently on radiation treatment, CAD with prior MI, permanent pacemaker 2017, A-fib, HTN, GERD, recent wt losses who was encouraged by oncologist to [facility] to ED for evaluation of worsening dehydration, soft blood pressures, 1 week history of generalized weakness, poor appetite, and po intake.  Patient with pancytopenia in the setting of chemoradiation  10/12: radiation   Per chart review, patient reports progressive dysphagia/odynophagia with onset around the time of starting radiation treatments. He reports upper abdominal pain after eating and painful/burning in esophagus when swallowing; no relief from magic mouthwash. Small drop in hemoglobin noted today; doubtful drop related to GI bleed per GI chart review; I unit PRBCs ordered.  Patient started on IV diflucan today; no EGD before 10/15 to allow adequate time to evaluate response to supportive measures. Patient continues on clear liquid diet; no recorded meals at this time. Will provide Boost Breeze TID to aid with calorie/protein needs.   I/O:s +2350 ml since admit  Current wt 90.4 kg (198.9 lb) UBW  192.5 lb - 201.7 lb over the past 2 months; noted 2.8 lb (1.4%) weight loss since 7/15 which is insignificant for time, still concerning given current chemoradiation therapies  and advanced age. 11/28/18 87.5 kg  11/21/18 90.5 kg  11/16/18 90.7 kg  11/09/18 90.8 kg  10/31/18 92.1 kg  10/26/18 91.3 kg  10/24/18 91.2 kg  09/14/18 91.7 kg    Medications reviewed and include: vit D3, magic mouthwash, oxycodone, protonix, B12  Labs: BUN 44 - trending down Cr 1.48 - trending down WBC 2.8 - trending up Hemoglobin 7.1 trending down  NUTRITION - FOCUSED PHYSICAL EXAM: Unable to complete at this time; patient on protective precautions  Diet Order:   Diet Order            Diet clear liquid Room service appropriate? Yes; Fluid consistency: Thin  Diet effective now              EDUCATION NEEDS:   No education needs have been identified at this time  Skin:  Skin Assessment: Reviewed RN Assessment  Last BM:  10/12; type 7; medium;green  Height:   Ht Readings from Last 1 Encounters:  12/12/18 6\' 1"  (1.854 m)    Weight:   Wt Readings from Last 1 Encounters:  12/12/18 90.4 kg    Ideal Body Weight:  83.6 kg  BMI:  Body mass index is 26.29 kg/m.  Estimated Nutritional Needs:   Kcal:  1610-9604 (MSJ 1.3-1.4)  Protein:  117-125 (1.4-1.5 g/kg/IBW)  Fluid:  >/= 2L/day  Lajuan Lines, RD, LDN Clinical Nutrition Office [phone] After Hours/Weekend Pager: [phone]

## 2018-12-13 NOTE — Progress Notes (Addendum)
Subjective: States he is not going well. Continues to have pain with swallowing and states, "feels like a fire ball when it hits the bottom." Burning in esophagus when swallowing. Feels foods will hang at times. Has been on clears and is not taking in much. [place] mouth wash with lidocaine helps numb the pain somewhat. Intermittent nausea. No vomiting. Coughing up phlegm. No hemoptysis. Reports one black stool yesterday. States he doesn't usually have black stools. He is on iron outpatient and received iron yesterday morning inpatient. No bright red blood per rectum. Has had two BMs this morning, no melena.  Mushy to loose. Hemoglobin down slightly since yesterday at 7.1 this morning. Felt a little "swimmy headed when waling this morning." Dr. Luan Pulling has ordered 1 unit PRBC.  Denies NSAID use.  Objective: Vital signs in last 24 hours: Temp:  [97.8 F (36.6 C)-98.2 F (36.8 C)] 97.8 F (36.6 C) (10/13 0458) Pulse Rate:  [69-87] 70 (10/13 0838) Resp:  [17-34] 17 (10/13 0458) BP: (104-136)/(47-99) 124/65 (10/13 0838) SpO2:  [97 %-100 %] 97 % (10/13 0458) Weight:  [90.4 kg] 90.4 kg (10/12 1347) Last BM Date: 12/12/18 General:   Alert and oriented, sitting in recliner, ill appearing.  Mouth:  Poor dentition. Without obvious lesions or thrush.    Abdomen:  Bowel sounds present, soft, non-distended. Mild to moderate tenderness to palpation in the No HSM or hernias noted. No rebound or guarding. No masses appreciated  Msk:  Symmetrical without gross deformities.  Extremities:  Without clubbing or edema. Neurologic:  Alert and  oriented x4;  grossly normal neurologically. Skin:  Warm and dry, intact without significant lesions.  Psych:  Normal mood and affect.  Intake/Output from previous day: 10/12 0701 - 10/13 0700 In: 2350.3 [IV Piggyback:2350.3] Out: -  Intake/Output this shift: No intake/output data recorded.  Lab Results: Recent Labs    12/12/18 0930 12/13/18 0536  WBC 2.6*  2.8*  HGB 7.8* 7.1*  HCT 23.8* 22.8*  PLT 134* 132*   BMET Recent Labs    12/12/18 0930 12/13/18 0536  NA 133* 139  K 3.1* 3.5  CL 96* 106  CO2 23 21*  GLUCOSE 116* 96  BUN 47* 44*  CREATININE 1.82* 1.48*  CALCIUM 8.9 8.5*   LFT Recent Labs    12/12/18 0930  PROT 6.7  ALBUMIN 3.9  AST 24  ALT 35  ALKPHOS 49  BILITOT 2.6*    Studies/Results: Dg Chest Port 1 View  Result Date: 12/12/2018 CLINICAL DATA:  Cough, weakness. EXAM: PORTABLE CHEST 1 VIEW COMPARISON:  November 18, 2018. FINDINGS: Stable cardiomediastinal silhouette. Stable left-sided pacemaker. Stable right subclavian Port-A-Cath. No pneumothorax or pleural effusion is noted. Both lungs are clear. The visualized skeletal structures are unremarkable. IMPRESSION: No active disease. Electronically Signed   By: Marijo Conception M.D.   On: 12/12/2018 09:38    Assessment: 85 year old male diagnosed with squamous cell carcinoma of left lung, undergoing chemo and radiation. Now with odynophagia/dysphagia, dyspepsia with onset at time of starting radiation in Sept 2020, failing supportive measures including magic mouthwash. Do obvious thrush in the mouth or oropharynx. Known history of IDA with last EGD in June 2019 with normal esophagus s/p dilation, gastric petechia, normal duodenum. 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 Has not been able to complete recommended 6 month early interval colonoscopy due other health issues including NSTEMI in August 2019 and undergoing pulmonary evaluation  at last office visit that led to lung cancer diagnosis.   Per Dr. Oneida Alar yesterday, plans to treat empirically with Diflucan x 48 hours and EGD if no clinical response. He continues to have minimal oral intake on clear liquids due to odynophagia, dysphagia, and dyspepsia. Also with intermittent nausea, no vomiting. Slight worsening of hemoglobin this  morning at 7.1 compared to 7.8 on admission. Reports one black stool yesterday; however, patient received iron yesterday morning. 2 BMs this morning, no melena. No brbpr. No hemoptysis. Xarelto has been on hold since approximately 12/05/18 according to oncology notes.  Doubt small drop in hemoglobin is related to GI bleed. He has pancytopenia in the setting of chemoradiation. Suspect this slight drop is likely dilutional with IV fluids. Dr. Luan Pulling has ordered 1 unit PRBCs. Will stop iron as this is clouding the picture. Continue to monitor for overt GI bleeding.   Discussed with Dr. Oneida Alar. No EGD before 10/15 to give adequate time to evaluate response to diflucan.   Plan: Continue Duflucan 100 mg IV daily in the setting of acute kidney injury. May be able to increase dose if kidney function improves.  Protonix 40 mg IV BID Continue clear liquids Continue magic mouthwash. EGD if no improvement with diflucan/supportive measures. Per Dr. Oneida Alar, not before 10/15 Continue to monitor for overt GI bleeding.  Continue Zofran PRN.  Stop oral iron.    LOS: 0 days    12/13/2018, 10:16 AM   Aliene Altes, [facility]

## 2018-12-14 ENCOUNTER — Encounter (HOSPITAL_COMMUNITY): Payer: Self-pay | Admitting: Hematology

## 2018-12-14 LAB — CBC WITH DIFFERENTIAL/PLATELET
Abs Immature Granulocytes: 0.01 10*3/uL (ref 0.00–0.07)
Basophils Absolute: 0 10*3/uL (ref 0.0–0.1)
Basophils Relative: 0 %
Eosinophils Absolute: 0 10*3/uL (ref 0.0–0.5)
Eosinophils Relative: 0 %
HCT: 21.2 % — ABNORMAL LOW (ref 39.0–52.0)
Hemoglobin: 6.5 g/dL — CL (ref 13.0–17.0)
Immature Granulocytes: 1 %
Lymphocytes Relative: 5 %
Lymphs Abs: 0.1 10*3/uL — ABNORMAL LOW (ref 0.7–4.0)
MCH: 32.2 pg (ref 26.0–34.0)
MCHC: 30.7 g/dL (ref 30.0–36.0)
MCV: 105 fL — ABNORMAL HIGH (ref 80.0–100.0)
Monocytes Absolute: 0.3 10*3/uL (ref 0.1–1.0)
Monocytes Relative: 14 %
Neutro Abs: 1.5 10*3/uL — ABNORMAL LOW (ref 1.7–7.7)
Neutrophils Relative %: 80 %
Platelets: 100 10*3/uL — ABNORMAL LOW (ref 150–400)
RBC: 2.02 MIL/uL — ABNORMAL LOW (ref 4.22–5.81)
RDW: 17.5 % — ABNORMAL HIGH (ref 11.5–15.5)
WBC: 1.8 10*3/uL — ABNORMAL LOW (ref 4.0–10.5)
nRBC: 1.1 % — ABNORMAL HIGH (ref 0.0–0.2)

## 2018-12-14 LAB — PREPARE RBC (CROSSMATCH)

## 2018-12-14 MED ORDER — ACETAMINOPHEN 325 MG PO TABS
650.0000 mg | ORAL_TABLET | Freq: Once | ORAL | Status: AC
  Administered 2018-12-14: 650 mg via ORAL
  Filled 2018-12-14: qty 2

## 2018-12-14 MED ORDER — SODIUM CHLORIDE 0.9% IV SOLUTION: Freq: Once | INTRAVENOUS | Status: DC

## 2018-12-14 MED ORDER — DIPHENHYDRAMINE HCL 25 MG PO CAPS
25.0000 mg | ORAL_CAPSULE | Freq: Once | ORAL | Status: AC
  Administered 2018-12-14: 25 mg via ORAL
  Filled 2018-12-14: qty 1

## 2018-12-14 NOTE — Progress Notes (Signed)
CRITICAL VALUE ALERT  Critical Value:  HGB 6.5  Date & Time Notied:  12/14/18 0700  Provider Notified: Luan Pulling  Orders Received/Actions taken:

## 2018-12-14 NOTE — Progress Notes (Addendum)
UNMATCHED BLOOD PRODUCT NOTE  Compare the patient ID on the blood tag to the patient ID on the hospital armband and Blood Bank armband. Then confirm the unit number on the blood tag matches the unit number on the blood product.  If a discrepancy is discovered return the product to blood bank immediately.   Blood Product Type: Packed Red Blood Cells  Unit #: (Found on blood product bag, begins with W) [id number]  Product Code #: (Found on blood product bag, begins with E) [id number]   Start Time: 1225  Starting Rate: 120 ml/hr  Rate increase/decreased  (if applicable):     0 ml/hr  Rate changed time (if applicable):    Stop Time: 3419   All Other Documentation should be documented within the Blood Admin Flowsheet per policy.  Blood products verified by Jeanice Lim, RN, Atilano Median, RN, and Santa Lighter, RN. Unable to chart blood product administration in [facility] due to scanner issues.

## 2018-12-14 NOTE — Progress Notes (Signed)
Subjective: He still does not feel well.  He still complaining of odynophagia.  His hemoglobin level this morning is 6.5.  He did not receive blood yesterday because of antibiotics.  I will plan to transfuse 2 units now instead of 1 if we can get the blood.  He still feels very weak.  He is not able to drink very much.  Objective: Vital signs in last 24 hours: Temp:  [97.8 F (36.6 C)-98.4 F (36.9 C)] 97.8 F (36.6 C) (10/14 0410) Pulse Rate:  [69-70] 70 (10/14 0410) Resp:  [18] 18 (10/14 0410) BP: (118-130)/(47-65) 130/64 (10/14 0410) SpO2:  [96 %-98 %] 96 % (10/14 0410) Weight change:  Last BM Date: 12/12/18  Intake/Output from previous day: 10/13 0701 - 10/14 0700 In: 360 [P.O.:360] Out: -   PHYSICAL EXAM General appearance: alert, cooperative and mild distress Resp: clear to auscultation bilaterally Cardio: Heart is regular to exam.  No gallop GI: soft, non-tender; bowel sounds normal; no masses,  no organomegaly Extremities: extremities normal, atraumatic, no cyanosis or edema  Lab Results:  Results for orders placed or performed during the hospital encounter of 12/12/18 (from the past 48 hour(s))  Urinalysis, Routine w reflex microscopic     Status: Abnormal   Collection Time: 12/12/18  9:13 AM  Result Value Ref Range   Color, Urine AMBER (A) YELLOW    Comment: BIOCHEMICALS MAY BE AFFECTED BY COLOR   APPearance HAZY (A) CLEAR   Specific Gravity, Urine 1.023 1.005 - 1.030   pH 6.0 5.0 - 8.0   Glucose, UA NEGATIVE NEGATIVE mg/dL   Hgb urine dipstick NEGATIVE NEGATIVE   Bilirubin Urine NEGATIVE NEGATIVE   Ketones, ur NEGATIVE NEGATIVE mg/dL   Protein, ur 30 (A) NEGATIVE mg/dL   Nitrite NEGATIVE NEGATIVE   Leukocytes,Ua NEGATIVE NEGATIVE   RBC / HPF 0-5 0 - 5 RBC/hpf   WBC, UA 0-5 0 - 5 WBC/hpf   Bacteria, UA NONE SEEN NONE SEEN   Squamous Epithelial / LPF 0-5 0 - 5   Mucus PRESENT    Hyaline Casts, UA PRESENT     Comment: Performed at [facility], [address], [place], [place] [address]  Comprehensive metabolic panel     Status: Abnormal   Collection Time: 12/12/18  9:30 AM  Result Value Ref Range   Sodium 133 (L) 135 - 145 mmol/L   Potassium 3.1 (L) 3.5 - 5.1 mmol/L   Chloride 96 (L) 98 - 111 mmol/L   CO2 23 22 - 32 mmol/L   Glucose, Bld 116 (H) 70 - 99 mg/dL   BUN 47 (H) 8 - 23 mg/dL   Creatinine, Ser 1.82 (H) 0.61 - 1.24 mg/dL   Calcium 8.9 8.9 - 10.3 mg/dL   Total Protein 6.7 6.5 - 8.1 g/dL   Albumin 3.9 3.5 - 5.0 g/dL   AST 24 15 - 41 U/L   ALT 35 0 - 44 U/L   Alkaline Phosphatase 49 38 - 126 U/L   Total Bilirubin 2.6 (H) 0.3 - 1.2 mg/dL   GFR calc non Af Amer 34 (L) >60 mL/min   GFR calc Af Amer 39 (L) >60 mL/min   Anion gap 14 5 - 15    Comment: Performed at [facility], [address] [place], [place] [address]  CBC with Differential/Platelet     Status: Abnormal   Collection Time: 12/12/18  9:30 AM  Result Value Ref Range   WBC 2.6 (L) 4.0 - 10.5 K/uL  RBC 2.46 (L) 4.22 - 5.81 MIL/uL   Hemoglobin 7.8 (L) 13.0 - 17.0 g/dL   HCT 23.8 (L) 39.0 - 52.0 %   MCV 96.7 80.0 - 100.0 fL   MCH 31.7 26.0 - 34.0 pg   MCHC 32.8 30.0 - 36.0 g/dL   RDW 14.6 11.5 - 15.5 %   Platelets 134 (L) 150 - 400 K/uL   nRBC 1.6 (H) 0.0 - 0.2 %   Neutrophils Relative % 83 %   Neutro Abs 2.1 1.7 - 7.7 K/uL   Lymphocytes Relative 4 %   Lymphs Abs 0.1 (L) 0.7 - 4.0 K/uL   Monocytes Relative 13 %   Monocytes Absolute 0.3 0.1 - 1.0 K/uL   Eosinophils Relative 0 %   Eosinophils Absolute 0.0 0.0 - 0.5 K/uL   Basophils Relative 0 %   Basophils Absolute 0.0 0.0 - 0.1 K/uL   Immature Granulocytes 0 %   Abs Immature Granulocytes 0.01 0.00 - 0.07 K/uL    Comment: Performed at [facility], [address] [place], [place] [address]  Troponin I (High Sensitivity)     Status: Abnormal   Collection Time: 12/12/18  9:30 AM  Result Value Ref Range   Troponin I (High Sensitivity) 43 (H) <18 ng/L    Comment: (NOTE) Elevated high sensitivity troponin I  (hsTnI) values and significant  changes across serial measurements may suggest ACS but many other  chronic and acute conditions are known to elevate hsTnI results.  Refer to the "Links" section for chest pain algorithms and additional  guidance. Performed at [facility], [address], [place], [place] [address]   Magnesium     Status: None   Collection Time: 12/12/18 11:02 AM  Result Value Ref Range   Magnesium 1.7 1.7 - 2.4 mg/dL    Comment: Performed at [facility], [address], [place], [place] [address]  SARS CORONAVIRUS 2 (TAT 6-24 HRS) Nasopharyngeal Nasopharyngeal Swab     Status: None   Collection Time: 12/12/18 11:43 AM   Specimen: Nasopharyngeal Swab  Result Value Ref Range   SARS Coronavirus 2 NEGATIVE NEGATIVE    Comment: (NOTE) SARS-CoV-2 target nucleic acids are NOT DETECTED. The SARS-CoV-2 RNA is generally detectable in upper and lower respiratory specimens during the acute phase of infection. Negative results do not preclude SARS-CoV-2 infection, do not rule out co-infections with other pathogens, and should not be used as the sole basis for treatment or other patient management decisions. Negative results must be combined with clinical observations, patient history, and epidemiological information. The expected result is Negative. Fact Sheet for Patients: SugarRoll.be Fact Sheet for Healthcare Providers: https://www.woods-mathews.com/ This test is not yet approved or cleared by the [place] FDA and  has been authorized for detection and/or diagnosis of SARS-CoV-2 by FDA under an Emergency Use Authorization (EUA). This EUA will remain  in effect (meaning this test can be used) for the duration of the COVID-19 declaration under Section 56 4(b)(1) of the Act, 21 U.S.C. section 360bbb-3(b)(1), unless the authorization is terminated or revoked sooner. Performed at [facility], [place] [address], [place],  [place] [address]   Magnesium     Status: None   Collection Time: 12/13/18  5:36 AM  Result Value Ref Range   Magnesium 1.9 1.7 - 2.4 mg/dL    Comment: Performed at [facility], [address] [place],  [address]  Basic metabolic panel     Status: Abnormal   Collection Time: 12/13/18  5:36  AM  Result Value Ref Range   Sodium 139 135 - 145 mmol/L   Potassium 3.5 3.5 - 5.1 mmol/L   Chloride 106 98 - 111 mmol/L   CO2 21 (L) 22 - 32 mmol/L   Glucose, Bld 96 70 - 99 mg/dL   BUN 44 (H) 8 - 23 mg/dL   Creatinine, Ser 1.48 (H) 0.61 - 1.24 mg/dL   Calcium 8.5 (L) 8.9 - 10.3 mg/dL   GFR calc non Af Amer 43 (L) >60 mL/min   GFR calc Af Amer 50 (L) >60 mL/min   Anion gap 12 5 - 15    Comment: Performed at [facility], [address] [place], [place] [address]  CBC     Status: Abnormal   Collection Time: 12/13/18  5:36 AM  Result Value Ref Range   WBC 2.8 (L) 4.0 - 10.5 K/uL   RBC 2.26 (L) 4.22 - 5.81 MIL/uL   Hemoglobin 7.1 (L) 13.0 - 17.0 g/dL   HCT 22.8 (L) 39.0 - 52.0 %   MCV 100.9 (H) 80.0 - 100.0 fL   MCH 31.4 26.0 - 34.0 pg   MCHC 31.1 30.0 - 36.0 g/dL   RDW 15.7 (H) 11.5 - 15.5 %   Platelets 132 (L) 150 - 400 K/uL   nRBC 1.8 (H) 0.0 - 0.2 %    Comment: Performed at [facility], [address] [place], [place] [address]  Type and screen [facility]     Status: None   Collection Time: 12/13/18 10:23 AM  Result Value Ref Range   ABO/RH(D)      A POS Performed at [facility], [address] [place], [place] [address]    Antibody Screen      POS Performed at [facility], [address], [place], [place] [address]    Sample Expiration      12/16/2018,2359 Performed at [facility], [address] [place], [place] [address]    DAT, IgG POS    Antibody Identification WARM AUTOANTIBODY    Antibody ID,T [place]      WARM AUTOANTIBODY Performed at [facility], [place] [address], [place], [place] [address]   Prepare RBC     Status: None   Collection  Time: 12/13/18 10:23 AM  Result Value Ref Range   Order Confirmation      ORDER PROCESSED BY BLOOD BANK Performed at [facility], [address] [place], [place] [address]   CBC with Differential/Platelet     Status: Abnormal   Collection Time: 12/14/18  5:45 AM  Result Value Ref Range   WBC 1.8 (L) 4.0 - 10.5 K/uL   RBC 2.02 (L) 4.22 - 5.81 MIL/uL   Hemoglobin 6.5 (LL) 13.0 - 17.0 g/dL    Comment: This critical result has verified and been called to [facility] by Sherri Huffines on 10 14 2020 at [phone], and has been read back.    HCT 21.2 (L) 39.0 - 52.0 %   MCV 105.0 (H) 80.0 - 100.0 fL   MCH 32.2 26.0 - 34.0 pg   MCHC 30.7 30.0 - 36.0 g/dL   RDW 17.5 (H) 11.5 - 15.5 %   Platelets 100 (L) 150 - 400 K/uL    Comment: PLATELET COUNT CONFIRMED BY SMEAR SPECIMEN CHECKED FOR CLOTS Immature Platelet Fraction may be clinically indicated, consider ordering this additional test [id number]    nRBC 1.1 (H) 0.0 - 0.2 %   Neutrophils Relative % 80 %   Neutro Abs  1.5 (L) 1.7 - 7.7 K/uL   Lymphocytes Relative 5 %   Lymphs Abs 0.1 (L) 0.7 - 4.0 K/uL   Monocytes Relative 14 %   Monocytes Absolute 0.3 0.1 - 1.0 K/uL   Eosinophils Relative 0 %   Eosinophils Absolute 0.0 0.0 - 0.5 K/uL   Basophils Relative 0 %   Basophils Absolute 0.0 0.0 - 0.1 K/uL   Immature Granulocytes 1 %   Abs Immature Granulocytes 0.01 0.00 - 0.07 K/uL    Comment: Performed at [facility], [address] [place], [place] [address]  Prepare RBC     Status: None   Collection Time: 12/14/18  7:35 AM  Result Value Ref Range   Order Confirmation      ORDER PROCESSED BY BLOOD BANK Performed at [facility], [place] [address], [place], [place] [address]     ABGS No results for input(s): PHART, PO2ART, TCO2, HCO3 in the last 72 hours.  Invalid input(s): PCO2 CULTURES Recent Results (from the past 240 hour(s))  SARS CORONAVIRUS 2 (TAT 6-24 HRS) Nasopharyngeal Nasopharyngeal Swab     Status: None    Collection Time: 12/12/18 11:43 AM   Specimen: Nasopharyngeal Swab  Result Value Ref Range Status   SARS Coronavirus 2 NEGATIVE NEGATIVE Final    Comment: (NOTE) SARS-CoV-2 target nucleic acids are NOT DETECTED. The SARS-CoV-2 RNA is generally detectable in upper and lower respiratory specimens during the acute phase of infection. Negative results do not preclude SARS-CoV-2 infection, do not rule out co-infections with other pathogens, and should not be used as the sole basis for treatment or other patient management decisions. Negative results must be combined with clinical observations, patient history, and epidemiological information. The expected result is Negative. Fact Sheet for Patients: SugarRoll.be Fact Sheet for Healthcare Providers: https://www.woods-mathews.com/ This test is not yet approved or cleared by the [place] FDA and  has been authorized for detection and/or diagnosis of SARS-CoV-2 by FDA under an Emergency Use Authorization (EUA). This EUA will remain  in effect (meaning this test can be used) for the duration of the COVID-19 declaration under Section 56 4(b)(1) of the Act, 21 U.S.C. section 360bbb-3(b)(1), unless the authorization is terminated or revoked sooner. Performed at [facility], [place] [address], [place], [place] [address]    Studies/Results: Dg Chest Port 1 View  Result Date: 12/12/2018 CLINICAL DATA:  Cough, weakness. EXAM: PORTABLE CHEST 1 VIEW COMPARISON:  November 18, 2018. FINDINGS: Stable cardiomediastinal silhouette. Stable left-sided pacemaker. Stable right subclavian Port-A-Cath. No pneumothorax or pleural effusion is noted. Both lungs are clear. The visualized skeletal structures are unremarkable. IMPRESSION: No active disease. Electronically Signed   By: Marijo Conception M.D.   On: 12/12/2018 09:38    Medications:  Prior to Admission:  Medications Prior to Admission  Medication Sig  Dispense Refill Last Dose  . acetaminophen (TYLENOL) 500 MG tablet Take 500 mg by mouth every 6 (six) hours as needed for pain.     [name] aspirin EC 81 MG tablet Take 81 mg by mouth daily.     [name] CARBOPLATIN IV Inject into the vein once a week.     . cholecalciferol (VITAMIN D) 1000 units tablet Take 1,000 Units by mouth 2 (two) times daily.     . clopidogrel (PLAVIX) 75 MG tablet Take 75 mg by mouth daily.     . dorzolamide-timolol (COSOPT) 22.3-6.8 MG/ML ophthalmic solution Place 1 drop into the left eye 2 (two) times daily.     [name]  ferrous sulfate 325 (65 FE) MG tablet Take 325 mg by mouth daily.      . hydrochlorothiazide (HYDRODIURIL) 25 MG tablet TAKE ONE (1) TABLET BY MOUTH EVERY DAY (Patient taking differently: Take 25 mg by mouth daily. ) 90 tablet 3   . lidocaine-prilocaine (EMLA) cream Apply a dime-sized amount to port a cath site and cover with plastic wrap one hour prior to chemotherapy appointments 30 g 0   . losartan (COZAAR) 25 MG tablet TAKE ONE TABLET BY MOUTY EVERY DAY (Patient taking differently: Take 25 mg by mouth daily. ) 90 tablet 1   . lovastatin (MEVACOR) 10 MG tablet TAKE ONE (1) TABLET BY MOUTH EVERY DAY 90 tablet 3   . metoprolol succinate (TOPROL XL) 25 MG 24 hr tablet Take 1 tablet (25 mg total) by mouth daily. 90 tablet 3   . ondansetron (ZOFRAN ODT) 8 MG disintegrating tablet Take 1 tablet (8 mg total) by mouth 2 (two) times daily as needed for nausea or vomiting. (Patient not taking: Reported on 11/28/2018) 60 tablet 3   . PACLITAXEL IV Inject into the vein once a week.     . pantoprazole (PROTONIX) 40 MG tablet Take 40 mg by mouth daily.     . prochlorperazine (COMPAZINE) 10 MG tablet Take 1 tablet (10 mg total) by mouth every 6 (six) hours as needed for nausea or vomiting. (Patient not taking: Reported on 11/28/2018) 30 tablet 1   . Propylene Glycol (SYSTANE BALANCE OP) Place 1 drop into both eyes 2 (two) times daily as needed (dry eyes).      [name] scopolamine  (TRANSDERM-SCOP) 1 MG/3DAYS Place 1 patch (1.5 mg total) onto the skin every 3 (three) days. (Patient not taking: Reported on 11/28/2018) 10 patch 12   . tamsulosin (FLOMAX) 0.4 MG CAPS capsule Take 0.4 mg by mouth daily.     [name] triamcinolone cream (KENALOG) 0.1 % Apply 1 application topically daily as needed (itching).      . vitamin B-12 (CYANOCOBALAMIN) 1000 MCG tablet Take 1,000 mcg by mouth daily.      Scheduled: . sodium chloride   Intravenous Once  . acetaminophen  650 mg Oral Once  . aspirin EC  81 mg Oral Daily  . Chlorhexidine Gluconate Cloth  6 each Topical Daily  . cholecalciferol  1,000 Units Oral BID  . diphenhydrAMINE  25 mg Oral Once  . dorzolamide-timolol  1 drop Left Eye BID  . feeding supplement  1 Container Oral TID BM  . LORazepam  1 mg Intravenous Once  . magic mouthwash w/lidocaine  10 mL Oral TID  . metoprolol succinate  25 mg Oral Daily  . oxyCODONE-acetaminophen  1 tablet Oral TID  . pantoprazole (PROTONIX) IV  40 mg Intravenous Q12H  . tamsulosin  0.4 mg Oral Daily  . vitamin B-12  1,000 mcg Oral Daily   Continuous: . sodium chloride 75 mL/hr at 12/13/18 1524  . fluconazole (DIFLUCAN) IV 100 mg (12/13/18 1525)   TDV:[id number], ondansetron **OR** ondansetron (ZOFRAN) IV  Assesment: He was admitted with odynophagia which appears to be related to esophagitis.  He has received radiation and that is a contributing factor.  He is having trouble eating.  He is being treated for Candida esophagitis as well.  He is pancytopenic from chemotherapy for squamous cell carcinoma of the lung and hemoglobin level this morning is 6.5.  His white blood count is 1800 and platelets 100,000.  He is weak which I think is multifactorial  He  has acute kidney injury and he is still going to need IV fluids because he is not taking much orally Active Problems:   Squamous cell carcinoma of lung (HCC)   Weakness   Odynophagia   AKI (acute kidney injury) ([place])    Radiation-induced esophagitis    Plan: Transfuse 2 units when available.  Continue other treatments.    LOS: 1 day   Alonza Bogus 12/14/2018, 8:15 AM

## 2018-12-14 NOTE — Progress Notes (Signed)
Patient's first unit of blood would not scan. I used the [facility] procedure for "Blood Administration". It discusses what to do if blood product will not scan. I verified with 2 other nurses (see note) that blood was correct for patient and called the blood bank to verify.  Second unit of blood scanned fine.

## 2018-12-14 NOTE — Progress Notes (Addendum)
Subjective: There has been difficulty getting patient transfused due to antibodies. Hemoglobin down to 6.5 today. Had a couple sips of broth and drank his apple juice this morning. Not taking in much by mouth due to persistent odynophagia with burning in esophagus and epigastric area. No improvement. Continues with intermittent nausea. No vomiting. No hemoptysis. BMs are soft to loose. Has had 2 BMs in the last 24 hours. No black stools. No blood in the stool. No recent hematuria. Occasional dizziness. Fatigue with minimal exertion. Admits to weakness.   Objective: Vital signs in last 24 hours: Temp:  [97.8 F (36.6 C)-98.4 F (36.9 C)] 97.8 F (36.6 C) (10/14 0410) Pulse Rate:  [69-70] 70 (10/14 0410) Resp:  [18] 18 (10/14 0410) BP: (118-130)/(47-65) 130/64 (10/14 0410) SpO2:  [96 %-98 %] 96 % (10/14 0410) Last BM Date: 12/12/18 General:   Alert and oriented, pleasant, no acute distress. Eating ice chips.  Head:  Normocephalic and atraumatic. Eyes:  No icterus, sclera clear. Conjuctiva pink.  Mouth:  Without obvious thrush. Oral mucosa is pink and moist.   Neck:  Supple, without thyromegaly or masses.  Abdomen:  Bowel sounds present, soft, non-distended. Mild tenderness to palpation in the epigastric area. No rebound or guarding. No masses appreciated  Msk:  Symmetrical without gross deformities. Extremities:  Without edema. Neurologic:  Alert and  oriented x4;  grossly normal neurologically. Skin:  Warm and dry, intact without significant lesions.  Psych: Normal mood and affect.  Intake/Output from previous day: 10/13 0701 - 10/14 0700 In: 360 [P.O.:360] Out: -  Intake/Output this shift: No intake/output data recorded.  Lab Results: Recent Labs    12/12/18 0930 12/13/18 0536 12/14/18 0545  WBC 2.6* 2.8* 1.8*  HGB 7.8* 7.1* 6.5*  HCT 23.8* 22.8* 21.2*  PLT 134* 132* 100*   BMET Recent Labs    12/12/18 0930 12/13/18 0536  NA 133* 139  K 3.1* 3.5  CL 96* 106   CO2 23 21*  GLUCOSE 116* 96  BUN 47* 44*  CREATININE 1.82* 1.48*  CALCIUM 8.9 8.5*   LFT Recent Labs    12/12/18 0930  PROT 6.7  ALBUMIN 3.9  AST 24  ALT 35  ALKPHOS 49  BILITOT 2.6*    Studies/Results: Dg Chest Port 1 View  Result Date: 12/12/2018 CLINICAL DATA:  Cough, weakness. EXAM: PORTABLE CHEST 1 VIEW COMPARISON:  November 18, 2018. FINDINGS: Stable cardiomediastinal silhouette. Stable left-sided pacemaker. Stable right subclavian Port-A-Cath. No pneumothorax or pleural effusion is noted. Both lungs are clear. The visualized skeletal structures are unremarkable. IMPRESSION: No active disease. Electronically Signed   By: Marijo Conception M.D.   On: 12/12/2018 09:38    Assessment: Active Problems:   Squamous cell carcinoma of lung (HCC)   Weakness   Odynophagia   AKI (acute kidney injury) [facility])   Radiation-induced esophagitis  85 year old male diagnosed with squamous cell carcinoma of left lung, undergoing chemo and radiation. Now with odynophagia/dysphagia, dyspepsia with onset at time of starting radiation in Sept 2020, failing supportive measures including magic mouthwash. Do obvious thrush in the mouth or oropharynx. Known history of IDA with last EGD in June 2019 with normal esophagus s/p dilation, gastric petechia, normal duodenum. 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 Has not been able to complete recommended 6 month early interval colonoscopy due other health issues including NSTEMI in August 2019 and undergoing pulmonary evaluation at last office  visit that led to lung cancer diagnosis.   Suspect patient likely has esophagitis secondary to chemoradiation. Possible candida esophagitis vs HSV esophagitis. He does have history of GERD, but symptoms were well controlled on Protonix outpatient. We have been empirically treating with diflucan; however, patient has not had any  improvement thus far. Minimal oral intake secondary to significant esophageal and epigastric burning. Intermittent nausea without vomiting. Patient needs an EGD for further evaluation; however, his hemoglobin has been down trending without any obvious source. Reported one episode of melena after receiving iron on 12/12/18. He is having BMs daily with no further melena. Iron was discontinued yesterday. Denies hematochezia, hemoptysis, or hematuria. As patient has pancytopenia, I suspect anemia is secondary to his chemoradiation with bone marrow suppression. Patient hasn't received ordered PRBCs due to antibodies. If patient is able to get transfused today and his hemoglobin improves, will likely proceed with EGD tomorrow for further evaluation. To discuss with Dr. [name].   Plan: Discuss timing of EGD with Dr. [name].  Continue empiric Duflucan 100 mg IV daily in the setting of acute kidney injury. May be able to increase dose if kidney function improves.  Protonix 40 mg IV BID Continue clear liquids Continue magic mouthwash. Continue Zofran PRN. Continue supportive measures.     LOS: 1 day    12/14/2018, 8:08 AM   Aliene Altes, [facility]   Attending note: Needs transfusion prior to EGD.  We will reassess his candidacy for upper endoscopy tomorrow morning.

## 2018-12-14 NOTE — Plan of Care (Signed)
  Problem: Education: Goal: Knowledge of General Education information will improve Description: Including pain rating scale, medication(s)/side effects and non-pharmacologic comfort measures Outcome: Progressing   Problem: Health Behavior/Discharge Planning: Goal: Ability to manage health-related needs will improve Outcome: Progressing   Problem: Education: Goal: Knowledge of General Education information will improve Description: Including pain rating scale, medication(s)/side effects and non-pharmacologic comfort measures Outcome: Progressing

## 2018-12-15 ENCOUNTER — Encounter (HOSPITAL_COMMUNITY): Payer: Self-pay | Admitting: *Deleted

## 2018-12-15 ENCOUNTER — Inpatient Hospital Stay (HOSPITAL_COMMUNITY): Payer: Medicare Other | Admitting: Anesthesiology

## 2018-12-15 ENCOUNTER — Encounter (HOSPITAL_COMMUNITY)
Admission: EM | Disposition: A | Discharge status: Skilled Nursing Facility | Payer: Self-pay | Source: Ambulatory Visit | Attending: Pulmonary Disease

## 2018-12-15 HISTORY — PX: BIOPSY: SHX5522

## 2018-12-15 HISTORY — PX: ESOPHAGOGASTRODUODENOSCOPY (EGD) WITH PROPOFOL: SHX5813

## 2018-12-15 LAB — TYPE AND SCREEN
ABO/RH(D): A POS
Antibody Screen: POSITIVE
DAT, IgG: POSITIVE
Unit division: 0
Unit division: 0

## 2018-12-15 LAB — BPAM RBC
Blood Product Expiration Date: 202011062359
Blood Product Expiration Date: 202011062359
ISSUE DATE / TIME: 202010141710
ISSUE DATE / TIME: 202010141710
Unit Type and Rh: 6200
Unit Type and Rh: 6200

## 2018-12-15 LAB — BASIC METABOLIC PANEL
Anion gap: 10 (ref 5–15)
BUN: 38 mg/dL — ABNORMAL HIGH (ref 8–23)
CO2: 22 mmol/L (ref 22–32)
Calcium: 8.4 mg/dL — ABNORMAL LOW (ref 8.9–10.3)
Chloride: 109 mmol/L (ref 98–111)
Creatinine, Ser: 1.17 mg/dL (ref 0.61–1.24)
GFR calc Af Amer: >60 mL/min (ref >60)
GFR calc non Af Amer: 57 mL/min — ABNORMAL LOW (ref >60)
Glucose, Bld: 89 mg/dL (ref 70–99)
Potassium: 3.6 mmol/L (ref 3.5–5.1)
Sodium: 141 mmol/L (ref 135–145)

## 2018-12-15 LAB — CBC WITH DIFFERENTIAL/PLATELET
Abs Immature Granulocytes: 0.01 10*3/uL (ref 0.00–0.07)
Basophils Absolute: 0 10*3/uL (ref 0.0–0.1)
Basophils Relative: 1 %
Eosinophils Absolute: 0 10*3/uL (ref 0.0–0.5)
Eosinophils Relative: 0 %
HCT: 28 % — ABNORMAL LOW (ref 39.0–52.0)
Hemoglobin: 8.7 g/dL — ABNORMAL LOW (ref 13.0–17.0)
Immature Granulocytes: 1 %
Lymphocytes Relative: 5 %
Lymphs Abs: 0.1 10*3/uL — ABNORMAL LOW (ref 0.7–4.0)
MCH: 32.3 pg (ref 26.0–34.0)
MCHC: 31.1 g/dL (ref 30.0–36.0)
MCV: 104.1 fL — ABNORMAL HIGH (ref 80.0–100.0)
Monocytes Absolute: 0.3 10*3/uL (ref 0.1–1.0)
Monocytes Relative: 16 %
Neutro Abs: 1.7 10*3/uL (ref 1.7–7.7)
Neutrophils Relative %: 77 %
Platelets: 86 10*3/uL — ABNORMAL LOW (ref 150–400)
RBC: 2.69 MIL/uL — ABNORMAL LOW (ref 4.22–5.81)
RDW: 18.3 % — ABNORMAL HIGH (ref 11.5–15.5)
WBC: 2.1 10*3/uL — ABNORMAL LOW (ref 4.0–10.5)
nRBC: 0.9 % — ABNORMAL HIGH (ref 0.0–0.2)

## 2018-12-15 SURGERY — ESOPHAGOGASTRODUODENOSCOPY (EGD) WITH PROPOFOL
Anesthesia: General

## 2018-12-15 MED ORDER — LACTATED RINGERS IV SOLN
INTRAVENOUS | Status: DC
  Administered 2018-12-15: 1000 mL via INTRAVENOUS

## 2018-12-15 MED ORDER — EPHEDRINE 5 MG/ML INJ
INTRAVENOUS | Status: AC
  Filled 2018-12-15: qty 10

## 2018-12-15 MED ORDER — PROPOFOL 500 MG/50ML IV EMUL
INTRAVENOUS | Status: DC | PRN
  Administered 2018-12-15: 125 ug/kg/min via INTRAVENOUS

## 2018-12-15 MED ORDER — HYDROMORPHONE HCL 1 MG/ML IJ SOLN: 0.2500 mg | INTRAMUSCULAR | Status: DC | PRN

## 2018-12-15 MED ORDER — FLUCONAZOLE IN SODIUM CHLORIDE 200-0.9 MG/100ML-% IV SOLN
200.0000 mg | INTRAVENOUS | Status: DC
  Administered 2018-12-15 – 2018-12-16 (×2): 200 mg via INTRAVENOUS
  Filled 2018-12-15 (×2): qty 100

## 2018-12-15 MED ORDER — PROMETHAZINE HCL 25 MG/ML IJ SOLN: 6.2500 mg | INTRAMUSCULAR | Status: DC | PRN

## 2018-12-15 MED ORDER — SODIUM CHLORIDE 0.9 % IV SOLN: INTRAVENOUS | Status: DC

## 2018-12-15 MED ORDER — HYDROCODONE-ACETAMINOPHEN 7.5-325 MG PO TABS: 1.0000 | ORAL_TABLET | Freq: Once | ORAL | Status: DC | PRN

## 2018-12-15 MED ORDER — SUCRALFATE 1 GM/10ML PO SUSP
1.0000 g | Freq: Three times a day (TID) | ORAL | Status: DC
  Administered 2018-12-15 – 2018-12-26 (×43): 1 g via ORAL
  Filled 2018-12-15 (×44): qty 10

## 2018-12-15 MED ORDER — LIDOCAINE 2% (20 MG/ML) 5 ML SYRINGE
INTRAMUSCULAR | Status: AC
  Filled 2018-12-15: qty 5

## 2018-12-15 MED ORDER — MIDAZOLAM HCL 2 MG/2ML IJ SOLN: 0.5000 mg | Freq: Once | INTRAMUSCULAR | Status: DC | PRN

## 2018-12-15 NOTE — Progress Notes (Addendum)
I saw the patient to discuss his current symptoms. He states he's still having a lot of odynophagia. He is amendable to EGD today if Dr. Luan Pulling agrees. I spoke with Dr. Luan Pulling who verbalized agreement with the plan for EGD. I informed the patient who now agrees.  Per nursing he received 2 units of blood and today his hgb is 8.7. Discussed with Dr. [name] who is agreeable to proceed with improved hgb.  Proceed with EGD on propofol/MAC with Dr. [name] in near future: the risks, benefits, and alternatives have been discussed with the patient in detail. The patient states understanding and desires to proceed.  SARS-COV-2 documented on admission as negative, no new URI symptoms. No need for repeat testing.   Thank you for allowing [place] to participate in the care of [place], DNP, AGNP-C Adult & Gerontological Nurse Practitioner [facility]

## 2018-12-15 NOTE — Progress Notes (Signed)
Held AM PO meds because patient was scheduled for an EGD and was NPO

## 2018-12-15 NOTE — Op Note (Addendum)
[facility] Patient Name: Nicholas Sanchez Procedure Date: 12/15/2018 11:29 AM MRN: [id number] Date of Birth: [birth year] Attending MD: Norvel Richards , MD CSN: [id number] Age: 85 Admit Type: Inpatient Procedure:                Upper GI endoscopy Indications:              Dysphagia, Odynophagia Providers:                Norvel Richards, MD, Charlsie Quest. Theda Sers RN, RN,                            Nelma Rothman, Technician Referring MD:              Medicines:                Propofol per Anesthesia Complications:            No immediate complications. Estimated Blood Loss:     Estimated blood loss was minimal. Estimated blood                            loss was minimal. Procedure:                Pre-Anesthesia Assessment:                           - Prior to the procedure, a History and Physical                            was performed, and patient medications and                            allergies were reviewed. The patient's tolerance of                            previous anesthesia was also reviewed. The risks                            and benefits of the procedure and the sedation                            options and risks were discussed with the patient.                            All questions were answered, and informed consent                            was obtained. Prior Anticoagulants: The patient has                            taken no previous anticoagulant or antiplatelet                            agents. ASA Grade Assessment: II - A patient with  mild systemic disease. After reviewing the risks                            and benefits, the patient was deemed in                            satisfactory condition to undergo the procedure.                           After obtaining informed consent, the endoscope was                            passed under direct vision. Throughout the                            procedure, the patient's  blood pressure, pulse, and                            oxygen saturations were monitored continuously. The                            [id number]-H190 ([id number]) scope was introduced through the                            mouth, and advanced to the second part of duodenum.                            The upper GI endoscopy was accomplished without                            difficulty. The patient tolerated the procedure                            well. Scope In: 11:35:36 AM Scope Out: 11:42:10 AM Total Procedure Duration: 0 hours 6 minutes 34 seconds  Findings:      Esophagitis with bleeding was found. 10 cm segment well demarcated mid       esophagus?denuded friable/inflamed mucosa. The tubular esophagus was       patent throughout its course. Additional denuding occurred with simple       scope passage. Normal segment biopsied. No dedicated dilation performed       due to friability and patency      A medium-sized hiatal hernia was present.      The exam was otherwise without abnormality.      The duodenal bulb and second portion of the duodenum were normal. Impression:               -Severe esophagitis?likely radiation induced.                            Status post biopsy.                           - Medium-sized hiatal hernia.                           -  The examination was otherwise normal.                           - Normal duodenal bulb and second portion of the                            duodenum.                           - No specimens collected. I discussed my findings                            and recommendations with patient's wife, Konrad Penta at [phone]. Moderate Sedation:      Moderate (conscious) sedation was personally administered by an       anesthesia professional. The following parameters were monitored: oxygen       saturation, heart rate, blood pressure, respiratory rate, EKG, adequacy       of pulmonary ventilation, and response to  care. Recommendation:           - Return patient to hospital ward for ongoing care.                           - Full liquid diet. Continue PPI. Add Carafate                            slurry's. May be able to discontinue Diflucan                            pending biopsy results                           - Continue present medications. Procedure Code(s):        --- Professional ---                           [phone], Esophagogastroduodenoscopy, flexible,                            transoral; diagnostic, including collection of                            specimen(s) by brushing or washing, when performed                            (separate procedure) Diagnosis Code(s):        --- Professional ---                           K20.9, Esophagitis, unspecified                           K44.9, Diaphragmatic hernia without obstruction or  gangrene                           R13.10, Dysphagia, unspecified CPT copyright 2019 American Medical Association. All rights reserved. The codes documented in this report are preliminary and upon coder review may  be revised to meet current compliance requirements. Cristopher Estimable. Kru Allman, MD Norvel Richards, MD 12/15/2018 11:56:36 AM This report has been signed electronically. Number of Addenda: 0

## 2018-12-15 NOTE — Progress Notes (Signed)
PHARMACY NOTE:  ANTIMICROBIAL RENAL DOSAGE ADJUSTMENT  Current antimicrobial regimen includes a mismatch between antimicrobial dosage and estimated renal function.  As per policy approved by the Pharmacy & Therapeutics and Medical Executive Committees, the antimicrobial dosage will be adjusted accordingly.  Current antimicrobial dosage:  Fluconazole 100 mg daily Indication: Candida esophagitis   Renal Function:  Estimated Creatinine Clearance: 54.1 mL/min (by C-G formula based on SCr of 1.17 mg/dL). []      On intermittent HD, scheduled: []      On CRRT    Antifungal dosage has been changed to:  Fluconazole 200 mg daily  Thank you for allowing pharmacy to be a part of this patient's care.  Ramond Craver, [facility] 12/15/2018 9:21 AM

## 2018-12-15 NOTE — Transfer of Care (Signed)
Immediate Anesthesia Transfer of Care Note  Patient: Beverly Sessions  Procedure(s) Performed: ESOPHAGOGASTRODUODENOSCOPY (EGD) WITH PROPOFOL (N/A ) BIOPSY  Patient Location: PACU  Anesthesia Type:MAC  Level of Consciousness: awake, alert , oriented and patient cooperative  Airway & Oxygen Therapy: Patient Spontanous Breathing and Patient connected to nasal cannula oxygen  Post-op Assessment: Report given to RN and Post -op Vital signs reviewed and stable  Post vital signs: Reviewed and stable  Last Vitals:  Vitals Value Taken Time  BP    Temp    Pulse 72 12/15/18 1149  Resp 20 12/15/18 1149  SpO2 95 % 12/15/18 1149  Vitals shown include unvalidated device data.  Last Pain:  Vitals:   12/15/18 1045  TempSrc: Oral  PainSc: 4       Patients Stated Pain Goal: 7 (63/86/85 4883)  Complications: No apparent anesthesia complications

## 2018-12-15 NOTE — Anesthesia Preprocedure Evaluation (Signed)
Anesthesia Evaluation  Patient identified by MRN, date of birth, ID band Patient awake    Reviewed: Allergy & Precautions, NPO status , Patient's Chart, lab work & pertinent test results  Airway Mallampati: II  TM Distance: >3 FB Neck ROM: Full    Dental no notable dental hx. (+) Poor Dentition, Chipped, Missing   Pulmonary shortness of breath and with exertion, former smoker,    Pulmonary exam normal breath sounds clear to auscultation       Cardiovascular Exercise Tolerance: Poor hypertension, Pt. on medications + angina with exertion + CAD and + Past MI  Normal cardiovascular exam+ dysrhythmias + pacemaker II Rhythm:Regular Rate:Normal  Reports Mis in 2010/2018 Pacer 2017 for SSS DES x2 09/2017 Denies recent CP Has known lung Ca on Chemo  WBCs up to 2.1K Plt 86K    Neuro/Psych negative neurological ROS  negative psych ROS   GI/Hepatic Neg liver ROS, GERD  Medicated and Controlled,  Endo/Other  negative endocrine ROS  Renal/GU Renal InsufficiencyRenal disease  negative genitourinary   Musculoskeletal negative musculoskeletal ROS (+)   Abdominal   Peds negative pediatric ROS (+)  Hematology negative hematology ROS (+) anemia , Severe anemia last 8.7/28   Anesthesia Other Findings   Reproductive/Obstetrics negative OB ROS                             Anesthesia Physical Anesthesia Plan  ASA: IV  Anesthesia Plan: General   Post-op Pain Management:    Induction: Intravenous  PONV Risk Score and Plan: 2 and Treatment may vary due to age or medical condition, Propofol infusion and TIVA  Airway Management Planned: Simple Face Mask and Nasal Cannula  Additional Equipment:   Intra-op Plan:   Post-operative Plan:   Informed Consent: I have reviewed the patients History and Physical, chart, labs and discussed the procedure including the risks, benefits and alternatives for the  proposed anesthesia with the patient or authorized representative who has indicated his/her understanding and acceptance.     Dental advisory given  Plan Discussed with: CRNA  Anesthesia Plan Comments: (Plan Full PPE use  Plan GA with GETA as needed d/w pt -WTP with same after Q&A  D/w pt unlikely need for post procedural ventilation -voiced understanding WTP)        Anesthesia Quick Evaluation

## 2018-12-15 NOTE — Anesthesia Postprocedure Evaluation (Signed)
Anesthesia Post Note  Patient: Nicholas Sanchez  Procedure(s) Performed: ESOPHAGOGASTRODUODENOSCOPY (EGD) WITH PROPOFOL (N/A ) BIOPSY  Patient location during evaluation: PACU Anesthesia Type: MAC Level of consciousness: awake and alert and oriented Pain management: pain level controlled Vital Signs Assessment: post-procedure vital signs reviewed and stable Respiratory status: spontaneous breathing, respiratory function stable and nonlabored ventilation Cardiovascular status: stable Postop Assessment: no apparent nausea or vomiting Anesthetic complications: no     Last Vitals:  Vitals:   12/15/18 0503 12/15/18 1045  BP: (!) 132/59 (!) 155/69  Pulse: 70   Resp: 20 (!) 22  Temp: 36.8 C 36.7 C  SpO2: 95% 95%    Last Pain:  Vitals:   12/15/18 1045  TempSrc: Oral  PainSc: 4                  [name]

## 2018-12-15 NOTE — Progress Notes (Signed)
Subjective: He looks better but he says he does not feel much different.  He is still having odynophagia even with water.  He is able to tolerate ice.  He was able to get blood transfusion yesterday and his hemoglobin level is 8.7 this morning.  His white blood counts come up to 2100 but his platelets have dropped a little bit to 86,000.  He does not have any other complaints.  He is weak.  He did not feel well enough to do physical therapy yesterday I think probably because of his anemia.  Objective: Vital signs in last 24 hours: Temp:  [97.9 F (36.6 C)-98.7 F (37.1 C)] 98.3 F (36.8 C) (10/15 0503) Pulse Rate:  [68-77] 70 (10/15 0503) Resp:  [18-21] 20 (10/15 0503) BP: (98-134)/(44-94) 132/59 (10/15 0503) SpO2:  [94 %-99 %] 95 % (10/15 0503) Weight change:  Last BM Date: 12/14/18  Intake/Output from previous day: 10/14 0701 - 10/15 0700 In: 720 [P.O.:720] Out: -   PHYSICAL EXAM General appearance: alert, cooperative and mild distress Resp: clear to auscultation bilaterally Cardio: Regular with no gallop.  It looks like he has paced rhythm on monitor GI: soft, non-tender; bowel sounds normal; no masses,  no organomegaly Extremities: extremities normal, atraumatic, no cyanosis or edema  Lab Results:  Results for orders placed or performed during the hospital encounter of 12/12/18 (from the past 48 hour(s))  Type and screen [facility]     Status: None   Collection Time: 12/13/18 10:23 AM  Result Value Ref Range   ABO/RH(D) A POS    Antibody Screen POS    Sample Expiration 12/16/2018,2359    DAT, IgG POS    Antibody Identification WARM AUTOANTIBODY    Antibody ID,T [place] WARM AUTOANTIBODY    Unit Number [id number]    Blood Component Type RED CELLS,LR    Unit division 00    Status of Unit ISSUED,FINAL    Donor AG Type      NEGATIVE FOR E ANTIGEN NEGATIVE FOR KELL ANTIGEN NEGATIVE FOR M ANTIGEN NEGATIVE FOR S ANTIGEN   Transfusion Status OK TO TRANSFUSE    Crossmatch Result COMPATIBLE    Unit Number      [id number] Performed at [facility], [place] [address], [place], [place] [address]    Blood Component Type      RED CELLS,LR Performed at [place] [address], [place], [place] [address]    Unit division      00 Performed at [facility], [place] [address], [place], [place] [address]    Status of Unit      [facility] Performed at [facility], [address] [place],  [address]    Donor AG Type      NEGATIVE FOR E ANTIGEN NEGATIVE FOR KELL ANTIGEN NEGATIVE FOR M ANTIGEN NEGATIVE FOR S ANTIGEN   Transfusion Status OK TO TRANSFUSE    Crossmatch Result COMPATIBLE   Prepare RBC     Status: None   Collection Time: 12/13/18 10:23 AM  Result Value Ref Range   Order Confirmation      ORDER PROCESSED BY BLOOD BANK Performed at [facility], [address] [place],  [address]   CBC with Differential/Platelet     Status: Abnormal   Collection Time: 12/14/18  5:45 AM  Result Value Ref Range   WBC 1.8 (L) 4.0 - 10.5 K/uL   RBC 2.02 (L) 4.22 - 5.81 MIL/uL   Hemoglobin 6.5 (LL)  13.0 - 17.0 g/dL    Comment: This critical result has verified and been called to [facility] by Sherri Huffines on 10 14 2020 at [phone], and has been read back.    HCT 21.2 (L) 39.0 - 52.0 %   MCV 105.0 (H) 80.0 - 100.0 fL   MCH 32.2 26.0 - 34.0 pg   MCHC 30.7 30.0 - 36.0 g/dL   RDW 17.5 (H) 11.5 - 15.5 %   Platelets 100 (L) 150 - 400 K/uL    Comment: PLATELET COUNT CONFIRMED BY SMEAR SPECIMEN CHECKED FOR CLOTS Immature Platelet Fraction may be clinically indicated, consider ordering this additional test [id number]    nRBC 1.1 (H) 0.0 - 0.2 %   Neutrophils Relative % 80 %   Neutro Abs 1.5 (L) 1.7 - 7.7 K/uL   Lymphocytes Relative 5 %   Lymphs Abs 0.1 (L) 0.7 - 4.0 K/uL   Monocytes Relative 14 %   Monocytes Absolute 0.3 0.1 - 1.0 K/uL   Eosinophils Relative 0 %   Eosinophils Absolute  0.0 0.0 - 0.5 K/uL   Basophils Relative 0 %   Basophils Absolute 0.0 0.0 - 0.1 K/uL   Immature Granulocytes 1 %   Abs Immature Granulocytes 0.01 0.00 - 0.07 K/uL    Comment: Performed at [facility], [address] [place], [place] [address]  Prepare RBC     Status: None   Collection Time: 12/14/18  7:35 AM  Result Value Ref Range   Order Confirmation      ORDER PROCESSED BY BLOOD BANK Performed at [facility], [place] [address] [place], [place] [address]   CBC with Differential/Platelet     Status: Abnormal   Collection Time: 12/15/18  4:44 AM  Result Value Ref Range   WBC 2.1 (L) 4.0 - 10.5 K/uL   RBC 2.69 (L) 4.22 - 5.81 MIL/uL   Hemoglobin 8.7 (L) 13.0 - 17.0 g/dL    Comment: POST TRANSFUSION SPECIMEN DELTA CHECK NOTED    HCT 28.0 (L) 39.0 - 52.0 %   MCV 104.1 (H) 80.0 - 100.0 fL   MCH 32.3 26.0 - 34.0 pg   MCHC 31.1 30.0 - 36.0 g/dL   RDW 18.3 (H) 11.5 - 15.5 %   Platelets 86 (L) 150 - 400 K/uL    Comment: SPECIMEN CHECKED FOR CLOTS Immature Platelet Fraction may be clinically indicated, consider ordering this additional test [id number] CONSISTENT WITH PREVIOUS RESULT    nRBC 0.9 (H) 0.0 - 0.2 %   Neutrophils Relative % 77 %   Neutro Abs 1.7 1.7 - 7.7 K/uL   Lymphocytes Relative 5 %   Lymphs Abs 0.1 (L) 0.7 - 4.0 K/uL   Monocytes Relative 16 %   Monocytes Absolute 0.3 0.1 - 1.0 K/uL   Eosinophils Relative 0 %   Eosinophils Absolute 0.0 0.0 - 0.5 K/uL   Basophils Relative 1 %   Basophils Absolute 0.0 0.0 - 0.1 K/uL   Immature Granulocytes 1 %   Abs Immature Granulocytes 0.01 0.00 - 0.07 K/uL    Comment: Performed at [facility], [address] [place], [place] [address]  Basic metabolic panel     Status: Abnormal   Collection Time: 12/15/18  4:44 AM  Result Value Ref Range   Sodium 141 135 - 145 mmol/L   Potassium 3.6 3.5 - 5.1 mmol/L   Chloride 109 98 - 111 mmol/L   CO2 22 22 - 32 mmol/L   Glucose, Bld 89 70 - 99  mg/dL   BUN 38 (H) 8 -  23 mg/dL   Creatinine, Ser 1.17 0.61 - 1.24 mg/dL   Calcium 8.4 (L) 8.9 - 10.3 mg/dL   GFR calc non Af Amer 57 (L) >60 mL/min   GFR calc Af Amer >60 >60 mL/min   Anion gap 10 5 - 15    Comment: Performed at [facility], [address], [place], [place] [address]    ABGS No results for input(s): PHART, PO2ART, TCO2, HCO3 in the last 72 hours.  Invalid input(s): PCO2 CULTURES Recent Results (from the past 240 hour(s))  SARS CORONAVIRUS 2 (TAT 6-24 HRS) Nasopharyngeal Nasopharyngeal Swab     Status: None   Collection Time: 12/12/18 11:43 AM   Specimen: Nasopharyngeal Swab  Result Value Ref Range Status   SARS Coronavirus 2 NEGATIVE NEGATIVE Final    Comment: (NOTE) SARS-CoV-2 target nucleic acids are NOT DETECTED. The SARS-CoV-2 RNA is generally detectable in upper and lower respiratory specimens during the acute phase of infection. Negative results do not preclude SARS-CoV-2 infection, do not rule out co-infections with other pathogens, and should not be used as the sole basis for treatment or other patient management decisions. Negative results must be combined with clinical observations, patient history, and epidemiological information. The expected result is Negative. Fact Sheet for Patients: SugarRoll.be Fact Sheet for Healthcare Providers: https://www.woods-mathews.com/ This test is not yet approved or cleared by the [place] FDA and  has been authorized for detection and/or diagnosis of SARS-CoV-2 by FDA under an Emergency Use Authorization (EUA). This EUA will remain  in effect (meaning this test can be used) for the duration of the COVID-19 declaration under Section 56 4(b)(1) of the Act, 21 U.S.C. section 360bbb-3(b)(1), unless the authorization is terminated or revoked sooner. Performed at [facility], [place] [address] [place], [place] [address]    Studies/Results: No results found.  Medications:  Prior to  Admission:  Medications Prior to Admission  Medication Sig Dispense Refill Last Dose  . acetaminophen (TYLENOL) 500 MG tablet Take 500 mg by mouth every 6 (six) hours as needed for pain.     [name] aspirin EC 81 MG tablet Take 81 mg by mouth daily.     [name] CARBOPLATIN IV Inject into the vein once a week.     . cholecalciferol (VITAMIN D) 1000 units tablet Take 1,000 Units by mouth 2 (two) times daily.     . clopidogrel (PLAVIX) 75 MG tablet Take 75 mg by mouth daily.     . dorzolamide-timolol (COSOPT) 22.3-6.8 MG/ML ophthalmic solution Place 1 drop into the left eye 2 (two) times daily.     . ferrous sulfate 325 (65 FE) MG tablet Take 325 mg by mouth daily.      . hydrochlorothiazide (HYDRODIURIL) 25 MG tablet TAKE ONE (1) TABLET BY MOUTH EVERY DAY (Patient taking differently: Take 25 mg by mouth daily. ) 90 tablet 3   . lidocaine-prilocaine (EMLA) cream Apply a dime-sized amount to port a cath site and cover with plastic wrap one hour prior to chemotherapy appointments 30 g 0   . losartan (COZAAR) 25 MG tablet TAKE ONE TABLET BY MOUTY EVERY DAY (Patient taking differently: Take 25 mg by mouth daily. ) 90 tablet 1   . lovastatin (MEVACOR) 10 MG tablet TAKE ONE (1) TABLET BY MOUTH EVERY DAY 90 tablet 3   . metoprolol succinate (TOPROL XL) 25 MG 24 hr tablet Take 1 tablet (25 mg total) by mouth daily. 90 tablet  3   . ondansetron (ZOFRAN ODT) 8 MG disintegrating tablet Take 1 tablet (8 mg total) by mouth 2 (two) times daily as needed for nausea or vomiting. (Patient not taking: Reported on 11/28/2018) 60 tablet 3   . PACLITAXEL IV Inject into the vein once a week.     . pantoprazole (PROTONIX) 40 MG tablet Take 40 mg by mouth daily.     . prochlorperazine (COMPAZINE) 10 MG tablet Take 1 tablet (10 mg total) by mouth every 6 (six) hours as needed for nausea or vomiting. (Patient not taking: Reported on 11/28/2018) 30 tablet 1   . Propylene Glycol (SYSTANE BALANCE OP) Place 1 drop into both eyes 2 (two) times  daily as needed (dry eyes).      [name] scopolamine (TRANSDERM-SCOP) 1 MG/3DAYS Place 1 patch (1.5 mg total) onto the skin every 3 (three) days. (Patient not taking: Reported on 11/28/2018) 10 patch 12   . tamsulosin (FLOMAX) 0.4 MG CAPS capsule Take 0.4 mg by mouth daily.     [name] triamcinolone cream (KENALOG) 0.1 % Apply 1 application topically daily as needed (itching).      . vitamin B-12 (CYANOCOBALAMIN) 1000 MCG tablet Take 1,000 mcg by mouth daily.      Scheduled: . sodium chloride   Intravenous Once  . aspirin EC  81 mg Oral Daily  . Chlorhexidine Gluconate Cloth  6 each Topical Daily  . cholecalciferol  1,000 Units Oral BID  . dorzolamide-timolol  1 drop Left Eye BID  . feeding supplement  1 Container Oral TID BM  . magic mouthwash w/lidocaine  10 mL Oral TID  . metoprolol succinate  25 mg Oral Daily  . oxyCODONE-acetaminophen  1 tablet Oral TID  . pantoprazole (PROTONIX) IV  40 mg Intravenous Q12H  . tamsulosin  0.4 mg Oral Daily  . vitamin B-12  1,000 mcg Oral Daily   Continuous: . sodium chloride 75 mL/hr at 12/13/18 1524  . fluconazole (DIFLUCAN) IV 100 mg (12/14/18 2147)   RXV:[id number], ondansetron **OR** ondansetron (ZOFRAN) IV  Assesment: He has squamous cell cancer of the lung and has been treated with chemotherapy and radiation.  He was admitted because of odontophagia radiation induced esophagitis possible Candida esophagitis and he continues to have severe pain when he swallows.  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  White blood count was low but is rebounding Active Problems:   Squamous cell carcinoma of lung (HCC)   Weakness   Odynophagia   AKI (acute kidney injury) ([place])   Radiation-induced esophagitis    Plan: Continue  current treatments.  He will see if he feels up to participating with physical therapy.    LOS: 2 days   Alonza Bogus 12/15/2018, 8:05 AM

## 2018-12-16 ENCOUNTER — Ambulatory Visit (HOSPITAL_COMMUNITY): Payer: Medicare Other

## 2018-12-16 DIAGNOSIS — E876 Hypokalemia: Secondary | ICD-10-CM

## 2018-12-16 DIAGNOSIS — E860 Dehydration: Secondary | ICD-10-CM

## 2018-12-16 DIAGNOSIS — R531 Weakness: Secondary | ICD-10-CM

## 2018-12-16 LAB — CBC WITH DIFFERENTIAL/PLATELET
Abs Immature Granulocytes: 0.01 10*3/uL (ref 0.00–0.07)
Basophils Absolute: 0 10*3/uL (ref 0.0–0.1)
Basophils Relative: 1 %
Eosinophils Absolute: 0 10*3/uL (ref 0.0–0.5)
Eosinophils Relative: 0 %
HCT: 29.5 % — ABNORMAL LOW (ref 39.0–52.0)
Hemoglobin: 8.9 g/dL — ABNORMAL LOW (ref 13.0–17.0)
Immature Granulocytes: 1 %
Lymphocytes Relative: 8 %
Lymphs Abs: 0.2 10*3/uL — ABNORMAL LOW (ref 0.7–4.0)
MCH: 31.9 pg (ref 26.0–34.0)
MCHC: 30.2 g/dL (ref 30.0–36.0)
MCV: 105.7 fL — ABNORMAL HIGH (ref 80.0–100.0)
Monocytes Absolute: 0.3 10*3/uL (ref 0.1–1.0)
Monocytes Relative: 18 %
Neutro Abs: 1.3 10*3/uL — ABNORMAL LOW (ref 1.7–7.7)
Neutrophils Relative %: 72 %
Platelets: 73 10*3/uL — ABNORMAL LOW (ref 150–400)
RBC: 2.79 MIL/uL — ABNORMAL LOW (ref 4.22–5.81)
RDW: 19.2 % — ABNORMAL HIGH (ref 11.5–15.5)
WBC: 1.8 10*3/uL — ABNORMAL LOW (ref 4.0–10.5)
nRBC: 0 % (ref 0.0–0.2)

## 2018-12-16 LAB — BASIC METABOLIC PANEL
Anion gap: 8 (ref 5–15)
BUN: 34 mg/dL — ABNORMAL HIGH (ref 8–23)
CO2: 25 mmol/L (ref 22–32)
Calcium: 8.4 mg/dL — ABNORMAL LOW (ref 8.9–10.3)
Chloride: 111 mmol/L (ref 98–111)
Creatinine, Ser: 1.09 mg/dL (ref 0.61–1.24)
GFR calc Af Amer: >60 mL/min (ref >60)
GFR calc non Af Amer: >60 mL/min (ref >60)
Glucose, Bld: 96 mg/dL (ref 70–99)
Potassium: 3.6 mmol/L (ref 3.5–5.1)
Sodium: 144 mmol/L (ref 135–145)

## 2018-12-16 MED ORDER — LIDOCAINE VISCOUS HCL 2 % MT SOLN
15.0000 mL | OROMUCOSAL | Status: DC | PRN
  Administered 2018-12-16 – 2018-12-17 (×4): 15 mL via OROMUCOSAL
  Filled 2018-12-16 (×5): qty 15

## 2018-12-16 NOTE — Progress Notes (Addendum)
Physical Therapy Treatment Patient Details Name: Nicholas Sanchez MRN: [id number] DOB: 07-10-1933 Today's Date: 12/16/2018    History of Present Illness Nicholas Sanchez is a 85 y.o. male with medical history significant for stage III squamous cell lung cancer currently on radiation treatment, CAD with prior MI, paroxysmal atrial fibrillation currently off Xarelto, hypertension, GERD, and recent weight loss who presented to the ED at the urging of his oncologist due to worsening dehydration and soft blood pressure readings.  He has had some generalized worsening weakness over the last 1 week and has had poor appetite as well as reduced p.o. intake on account of odynophagia.  He is also had some mild epigastric abdominal tenderness, but no nausea, vomiting, or diarrhea.  No fevers or chills noted.  He denies any chest pain, cough, shortness of breathing, or hemoptysis.  He states that he tries to take his Magic mouthwash at home on account of his ongoing symptoms, but this has not been helping very much.    PT Comments    Patient able to complete bed mobility independently when performed slowly. He is able to complete LE exercises seated at edge of bed with no c/o difficulty. He requires min assist and minimal verbal cueing to transfer to standing with use of RW both from side of bed and to/from toilet. Patient able to ambulate with min assist with use of RW and continues to be most limited secondary to fatigue and impaired activity tolerance. RN notified PT that patient's heart monitor was showing up as possible V-tach but patient was not experiencing any symptoms, patient was returned to bed by PT and RN and left in room with RN. Patient will benefit from continued physical therapy in hospital and recommended venue below to increase strength, balance, endurance for safe ADLs and gait.     Follow Up Recommendations  SNF;Supervision for mobility/OOB;Supervision - Intermittent     Equipment  Recommendations  None recommended by PT    Recommendations for Other Services       Precautions / Restrictions Precautions Precautions: Fall Restrictions Weight Bearing Restrictions: No    Mobility  Bed Mobility Overal bed mobility: Modified Independent             General bed mobility comments: slow, labored  Transfers Overall transfer level: Needs assistance Equipment used: Rolling walker (2 wheeled) Transfers: Sit to/from Stand Sit to Stand: Min assist         General transfer comment: required min verbal cueing for hand placement with sit to stand, transfer sit to stand from bed and from toilet  Ambulation/Gait Ambulation/Gait assistance: Min assist Gait Distance (Feet): 40 Feet Assistive device: Rolling walker (2 wheeled) Gait Pattern/deviations: Decreased step length - right;Decreased step length - left;Decreased stride length Gait velocity: slow   General Gait Details: slow labored cadence with shaking of BUE, limited secondary to c/o fatigue, no loss of balance   Stairs             Wheelchair Mobility    Modified Rankin (Stroke Patients Only)       Balance Overall balance assessment: Needs assistance Sitting-balance support: Feet supported;No upper extremity supported Sitting balance-Leahy Scale: Good Sitting balance - Comments: seated at bedside   Standing balance support: During functional activity;Bilateral upper extremity supported Standing balance-Leahy Scale: Fair Standing balance comment: using RW                            Cognition  Arousal/Alertness: Awake/alert Behavior During Therapy: WFL for tasks assessed/performed Overall Cognitive Status: Within Functional Limits for tasks assessed                                        Exercises General Exercises - Lower Extremity Long Arc Quad: AROM;Seated;Both;10 reps Hip Flexion/Marching: AROM;Seated;Both;10 reps Toe Raises: AROM;Seated;Both;10  reps Heel Raises: AROM;Seated;Both;10 reps    General Comments        Pertinent Vitals/Pain Pain Assessment: No/denies pain    Home Living                      Prior Function            PT Goals (current goals can now be found in the care plan section) Acute Rehab PT Goals Patient Stated Goal: return home with family to assist PT Goal Formulation: With patient Time For Goal Achievement: 12/27/18 Potential to Achieve Goals: Good Progress towards PT goals: Progressing toward goals    Frequency    Min 3X/week      PT Plan      Co-evaluation              AM-PAC PT "6 Clicks" Mobility   Outcome Measure  Help needed turning from your back to your side while in a flat bed without using bedrails?: None Help needed moving from lying on your back to sitting on the side of a flat bed without using bedrails?: None Help needed moving to and from a bed to a chair (including a wheelchair)?: A Little Help needed standing up from a chair using your arms (e.g., wheelchair or bedside chair)?: A Little Help needed to walk in hospital room?: A Little Help needed climbing 3-5 steps with a railing? : A Lot 6 Click Score: 19    End of Session Equipment Utilized During Treatment: Gait belt Activity Tolerance: Patient tolerated treatment well;Patient limited by fatigue Patient left: with nursing/sitter in room;in bed;with call bell/phone within reach Nurse Communication: Mobility status PT Visit Diagnosis: Unsteadiness on feet (R26.81);Other abnormalities of gait and mobility (R26.89);Muscle weakness (generalized) (M62.81)     Time: 4696-2952 PT Time Calculation (min) (ACUTE ONLY): 46 min  Charges:  $Gait Training: 8-22 mins $Therapeutic Activity: 23-37 mins                     3:52 PM, 12/16/18 Mearl Latin PT, DPT Physical Therapist at [facility]

## 2018-12-16 NOTE — TOC Progression Note (Addendum)
Transition of Care [facility]) - Progression Note    Patient Details  Name: Nicholas Sanchez MRN: [id number] Date of Birth: 02-04-1934  Transition of [facility]) CM/SW Contact  Ihor Gully, LCSW Phone Number: 12/16/2018, 4:39 PM  Clinical Narrative:    PT recommendation for SNF discussed with patient. Patient is currently unsure as to whether he want HHPT or SNF. He does indicate that he would prefer to discharge home if possible.  Agreeable to [facility] speaking with daughter, Brayton Layman, about recommendation. Attempts to call daughter made from desk phone and hospital cellular phone and each time a message stating "Relay text system. Your number is not associated with an active conversation."  TOC will continue to attempt to reach daughter in hospital while patient considers SNF.   Expected Discharge Plan: Home/Self Care Barriers to Discharge: Continued Medical Work up  Expected Discharge Plan and Services Expected Discharge Plan: Home/Self Care   Discharge Planning Services: CM Consult   Living arrangements for the past 2 months: Single Family Home                                       Social Determinants of Health (SDOH) Interventions    Readmission Risk Interventions Readmission Risk Prevention Plan 12/13/2018  Transportation Screening Complete  Social Work Consult for [place] Planning/Counseling Complete  Medication Review [employer]) Complete  Some recent data might be hidden

## 2018-12-16 NOTE — Progress Notes (Signed)
Subjective: He says he feels okay.  His swallowing issue may be a little bit better.  He says that he drank some water yesterday evening and it seemed not to burn as much.  He is now on Carafate.  He had endoscopy done yesterday that showed what appeared to be mostly radiation esophagitis.  He may be developing some symptoms of depression  Objective: Vital signs in last 24 hours: Temp:  [97.5 F (36.4 C)-99.6 F (37.6 C)] 98.4 F (36.9 C) (10/16 0602) Pulse Rate:  [68-70] 69 (10/16 0602) Resp:  [18-23] 19 (10/16 0602) BP: (120-155)/(53-85) 129/85 (10/16 0602) SpO2:  [92 %-100 %] 98 % (10/16 0602) Weight change:  Last BM Date: 12/14/18  Intake/Output from previous day: 10/15 0701 - 10/16 0700 In: 660 [P.O.:360; I.V.:300] Out: -   PHYSICAL EXAM General appearance: alert, cooperative and no distress Resp: clear to auscultation bilaterally Cardio: regular rate and rhythm, S1, S2 normal, no murmur, click, rub or gallop GI: soft, non-tender; bowel sounds normal; no masses,  no organomegaly Extremities: extremities normal, atraumatic, no cyanosis or edema  Lab Results:  Results for orders placed or performed during the hospital encounter of 12/12/18 (from the past 48 hour(s))  CBC with Differential/Platelet     Status: Abnormal   Collection Time: 12/15/18  4:44 AM  Result Value Ref Range   WBC 2.1 (L) 4.0 - 10.5 K/uL   RBC 2.69 (L) 4.22 - 5.81 MIL/uL   Hemoglobin 8.7 (L) 13.0 - 17.0 g/dL    Comment: POST TRANSFUSION SPECIMEN DELTA CHECK NOTED    HCT 28.0 (L) 39.0 - 52.0 %   MCV 104.1 (H) 80.0 - 100.0 fL   MCH 32.3 26.0 - 34.0 pg   MCHC 31.1 30.0 - 36.0 g/dL   RDW 18.3 (H) 11.5 - 15.5 %   Platelets 86 (L) 150 - 400 K/uL    Comment: SPECIMEN CHECKED FOR CLOTS Immature Platelet Fraction may be clinically indicated, consider ordering this additional test [id number] CONSISTENT WITH PREVIOUS RESULT    nRBC 0.9 (H) 0.0 - 0.2 %   Neutrophils Relative % 77 %   Neutro Abs 1.7 1.7  - 7.7 K/uL   Lymphocytes Relative 5 %   Lymphs Abs 0.1 (L) 0.7 - 4.0 K/uL   Monocytes Relative 16 %   Monocytes Absolute 0.3 0.1 - 1.0 K/uL   Eosinophils Relative 0 %   Eosinophils Absolute 0.0 0.0 - 0.5 K/uL   Basophils Relative 1 %   Basophils Absolute 0.0 0.0 - 0.1 K/uL   Immature Granulocytes 1 %   Abs Immature Granulocytes 0.01 0.00 - 0.07 K/uL    Comment: Performed at [facility], [address], [place], [place] [address]  Basic metabolic panel     Status: Abnormal   Collection Time: 12/15/18  4:44 AM  Result Value Ref Range   Sodium 141 135 - 145 mmol/L   Potassium 3.6 3.5 - 5.1 mmol/L   Chloride 109 98 - 111 mmol/L   CO2 22 22 - 32 mmol/L   Glucose, Bld 89 70 - 99 mg/dL   BUN 38 (H) 8 - 23 mg/dL   Creatinine, Ser 1.17 0.61 - 1.24 mg/dL   Calcium 8.4 (L) 8.9 - 10.3 mg/dL   GFR calc non Af Amer 57 (L) >60 mL/min   GFR calc Af Amer >60 >60 mL/min   Anion gap 10 5 - 15    Comment: Performed at [facility], [address] [place], [place] [address]  CBC  with Differential/Platelet     Status: Abnormal   Collection Time: 12/16/18  5:29 AM  Result Value Ref Range   WBC 1.8 (L) 4.0 - 10.5 K/uL   RBC 2.79 (L) 4.22 - 5.81 MIL/uL   Hemoglobin 8.9 (L) 13.0 - 17.0 g/dL   HCT 29.5 (L) 39.0 - 52.0 %   MCV 105.7 (H) 80.0 - 100.0 fL   MCH 31.9 26.0 - 34.0 pg   MCHC 30.2 30.0 - 36.0 g/dL   RDW 19.2 (H) 11.5 - 15.5 %   Platelets 73 (L) 150 - 400 K/uL    Comment: REPEATED TO VERIFY PLATELET COUNT CONFIRMED BY SMEAR SPECIMEN CHECKED FOR CLOTS Immature Platelet Fraction may be clinically indicated, consider ordering this additional test [id number]    nRBC 0.0 0.0 - 0.2 %   Neutrophils Relative % 72 %   Neutro Abs 1.3 (L) 1.7 - 7.7 K/uL   Lymphocytes Relative 8 %   Lymphs Abs 0.2 (L) 0.7 - 4.0 K/uL   Monocytes Relative 18 %   Monocytes Absolute 0.3 0.1 - 1.0 K/uL   Eosinophils Relative 0 %   Eosinophils Absolute 0.0 0.0 - 0.5 K/uL   Basophils Relative 1 %   Basophils Absolute  0.0 0.0 - 0.1 K/uL   Immature Granulocytes 1 %   Abs Immature Granulocytes 0.01 0.00 - 0.07 K/uL    Comment: Performed at [facility], [address], [place],  [address]  Basic metabolic panel     Status: Abnormal   Collection Time: 12/16/18  5:29 AM  Result Value Ref Range   Sodium 144 135 - 145 mmol/L   Potassium 3.6 3.5 - 5.1 mmol/L   Chloride 111 98 - 111 mmol/L   CO2 25 22 - 32 mmol/L   Glucose, Bld 96 70 - 99 mg/dL   BUN 34 (H) 8 - 23 mg/dL   Creatinine, Ser 1.09 0.61 - 1.24 mg/dL   Calcium 8.4 (L) 8.9 - 10.3 mg/dL   GFR calc non Af Amer >60 >60 mL/min   GFR calc Af Amer >60 >60 mL/min   Anion gap 8 5 - 15    Comment: Performed at [facility], [address], [place], [place] [address]    ABGS No results for input(s): PHART, PO2ART, TCO2, HCO3 in the last 72 hours.  Invalid input(s): PCO2 CULTURES Recent Results (from the past 240 hour(s))  SARS CORONAVIRUS 2 (TAT 6-24 HRS) Nasopharyngeal Nasopharyngeal Swab     Status: None   Collection Time: 12/12/18 11:43 AM   Specimen: Nasopharyngeal Swab  Result Value Ref Range Status   SARS Coronavirus 2 NEGATIVE NEGATIVE Final    Comment: (NOTE) SARS-CoV-2 target nucleic acids are NOT DETECTED. The SARS-CoV-2 RNA is generally detectable in upper and lower respiratory specimens during the acute phase of infection. Negative results do not preclude SARS-CoV-2 infection, do not rule out co-infections with other pathogens, and should not be used as the sole basis for treatment or other patient management decisions. Negative results must be combined with clinical observations, patient history, and epidemiological information. The expected result is Negative. Fact Sheet for Patients: SugarRoll.be Fact Sheet for Healthcare Providers: https://www.woods-mathews.com/ This test is not yet approved or cleared by the [place] FDA and  has been authorized for detection and/or diagnosis  of SARS-CoV-2 by FDA under an Emergency Use Authorization (EUA). This EUA will remain  in effect (meaning this test can be used) for the duration of the COVID-19 declaration under Section 56 4(b)(1) of  the Act, 21 U.S.C. section 360bbb-3(b)(1), unless the authorization is terminated or revoked sooner. Performed at [facility], [place] [address], [place], [place] [address]    Studies/Results: No results found.  Medications:  Prior to Admission:  Medications Prior to Admission  Medication Sig Dispense Refill Last Dose  . acetaminophen (TYLENOL) 500 MG tablet Take 500 mg by mouth every 6 (six) hours as needed for pain.     [name] aspirin EC 81 MG tablet Take 81 mg by mouth daily.     [name] CARBOPLATIN IV Inject into the vein once a week.     . cholecalciferol (VITAMIN D) 1000 units tablet Take 1,000 Units by mouth 2 (two) times daily.     . clopidogrel (PLAVIX) 75 MG tablet Take 75 mg by mouth daily.     . dorzolamide-timolol (COSOPT) 22.3-6.8 MG/ML ophthalmic solution Place 1 drop into the left eye 2 (two) times daily.     . ferrous sulfate 325 (65 FE) MG tablet Take 325 mg by mouth daily.      . hydrochlorothiazide (HYDRODIURIL) 25 MG tablet TAKE ONE (1) TABLET BY MOUTH EVERY DAY (Patient taking differently: Take 25 mg by mouth daily. ) 90 tablet 3   . lidocaine-prilocaine (EMLA) cream Apply a dime-sized amount to port a cath site and cover with plastic wrap one hour prior to chemotherapy appointments 30 g 0   . losartan (COZAAR) 25 MG tablet TAKE ONE TABLET BY MOUTY EVERY DAY (Patient taking differently: Take 25 mg by mouth daily. ) 90 tablet 1   . lovastatin (MEVACOR) 10 MG tablet TAKE ONE (1) TABLET BY MOUTH EVERY DAY 90 tablet 3   . metoprolol succinate (TOPROL XL) 25 MG 24 hr tablet Take 1 tablet (25 mg total) by mouth daily. 90 tablet 3   . ondansetron (ZOFRAN ODT) 8 MG disintegrating tablet Take 1 tablet (8 mg total) by mouth 2 (two) times daily as needed for nausea or vomiting.  (Patient not taking: Reported on 11/28/2018) 60 tablet 3   . PACLITAXEL IV Inject into the vein once a week.     . pantoprazole (PROTONIX) 40 MG tablet Take 40 mg by mouth daily.     . prochlorperazine (COMPAZINE) 10 MG tablet Take 1 tablet (10 mg total) by mouth every 6 (six) hours as needed for nausea or vomiting. (Patient not taking: Reported on 11/28/2018) 30 tablet 1   . Propylene Glycol (SYSTANE BALANCE OP) Place 1 drop into both eyes 2 (two) times daily as needed (dry eyes).      [name] scopolamine (TRANSDERM-SCOP) 1 MG/3DAYS Place 1 patch (1.5 mg total) onto the skin every 3 (three) days. (Patient not taking: Reported on 11/28/2018) 10 patch 12   . tamsulosin (FLOMAX) 0.4 MG CAPS capsule Take 0.4 mg by mouth daily.     [name] triamcinolone cream (KENALOG) 0.1 % Apply 1 application topically daily as needed (itching).      . vitamin B-12 (CYANOCOBALAMIN) 1000 MCG tablet Take 1,000 mcg by mouth daily.      Scheduled: . sodium chloride   Intravenous Once  . aspirin EC  81 mg Oral Daily  . Chlorhexidine Gluconate Cloth  6 each Topical Daily  . cholecalciferol  1,000 Units Oral BID  . dorzolamide-timolol  1 drop Left Eye BID  . feeding supplement  1 Container Oral TID BM  . magic mouthwash w/lidocaine  10 mL Oral TID  . metoprolol succinate  25 mg Oral Daily  . oxyCODONE-acetaminophen  1 tablet Oral TID  . pantoprazole (PROTONIX) IV  40 mg Intravenous Q12H  . sucralfate  1 g Oral TID WC & HS  . tamsulosin  0.4 mg Oral Daily  . vitamin B-12  1,000 mcg Oral Daily   Continuous: . sodium chloride 75 mL/hr at 12/13/18 1524  . fluconazole (DIFLUCAN) IV 200 mg (12/15/18 2115)   OIL:[id number], ondansetron **OR** ondansetron (ZOFRAN) IV  Assesment: He was admitted with odynophagia.  He is better I think.  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  He was receiving radiation for squamous cell carcinoma of the lung.  He has coronary disease at baseline but is not having any chest  pain  He has had paroxysmal atrial fib but he is off anticoagulation because of hemoptysis  He was dehydrated which seems to be better Active Problems:   Squamous cell carcinoma of lung (HCC)   Weakness   Odynophagia   AKI (acute kidney injury) ([place])   Radiation-induced esophagitis    Plan: Continue treatments.    LOS: 3 days   Nicholas Sanchez 12/16/2018, 8:37 AM

## 2018-12-16 NOTE — Care Management Important Message (Signed)
Important Message  Patient Details  Name: Nicholas Sanchez MRN: [id number] Date of Birth: [birth year]   Medicare Important Message Given:  Yes     Tommy Medal 12/16/2018, 3:34 PM

## 2018-12-16 NOTE — Progress Notes (Signed)
**Note De-Identified Gerhardt Gleed Obfuscation** STAT EKG complete: RN placed in chart.

## 2018-12-16 NOTE — Progress Notes (Signed)
Subjective: Feels maybe a little better today. States he couldn't swallow the lidocaine this morning. Still with a lot of difficulty swallowing. No other abdominal pain, N/V. No obvious GI bleed or hematemesis. No other GI complaints.  Discussed with nursing, the patient's Magic Mouthwash with Lidocaine hasn't come up yet; the patient couldn't take Carafate.  Objective: Vital signs in last 24 hours: Temp:  [97.5 F (36.4 C)-98.4 F (36.9 C)] 98.4 F (36.9 C) (10/16 0602) Pulse Rate:  [68-69] 69 (10/16 0602) Resp:  [18-19] 19 (10/16 0602) BP: (120-129)/(53-85) 129/85 (10/16 0602) SpO2:  [92 %-100 %] 98 % (10/16 0602) Last BM Date: 12/14/18 General:   Alert and oriented, pleasant. Seems tired and frustrated. Head:  Normocephalic and atraumatic. Eyes:  No icterus, sclera clear. Conjuctiva pink.  Heart:  S1, S2 present, no murmurs noted.  Lungs: Clear to auscultation bilaterally, without wheezing, rales, or rhonchi.  Abdomen:  Bowel sounds present, soft, non-tender, non-distended. No HSM or hernias noted. No rebound or guarding. No masses appreciated  Msk:  Symmetrical without gross deformities. Normal posture. Extremities:  Without clubbing or edema. Neurologic:  Alert and  oriented x4;  grossly normal neurologically. Psych:  Alert and cooperative. Normal mood and affect.  Intake/Output from previous day: 10/15 0701 - 10/16 0700 In: 660 [P.O.:360; I.V.:300] Out: -  Intake/Output this shift: No intake/output data recorded.  Lab Results: Recent Labs    12/14/18 0545 12/15/18 0444 12/16/18 0529  WBC 1.8* 2.1* 1.8*  HGB 6.5* 8.7* 8.9*  HCT 21.2* 28.0* 29.5*  PLT 100* 86* 73*   BMET Recent Labs    12/15/18 0444 12/16/18 0529  NA 141 144  K 3.6 3.6  CL 109 111  CO2 22 25  GLUCOSE 89 96  BUN 38* 34*  CREATININE 1.17 1.09  CALCIUM 8.4* 8.4*   LFT No results for input(s): PROT, ALBUMIN, AST, ALT, ALKPHOS, BILITOT, BILIDIR, IBILI in the last 72 hours. PT/INR No  results for input(s): LABPROT, INR in the last 72 hours. Hepatitis Panel No results for input(s): HEPBSAG, HCVAB, HEPAIGM, HEPBIGM in the last 72 hours.   Studies/Results: No results found.  Assessment: 85 year old male diagnosed with squamous cell carcinoma of left lung, undergoing chemo and radiation. Now with odynophagia/dysphagia, dyspepsia with onset at time of starting radiation in Sept 2020, failing supportive measures including magic mouthwash. No obvious thrush in the mouth or oropharynx. Known history of IDA with lastEGD in June 2019 with normal esophagus s/p dilation, gastric petechia, normal duodenum. 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 Has not been able to complete recommended 6 month early interval colonoscopy due other health issues including NSTEMI in August 2019 and undergoing pulmonary evaluation at last office visit that led to lung cancer diagnosis.   He does have history of GERD, but symptoms were well controlled on Protonix outpatient. We have been empirically treating with diflucan, however, patient has not had any improvement thus far. Minimal oral intake secondary to significant esophageal and epigastric burning. He is having BMs daily with no further melena. Iron was discontinued. Denies hematochezia, hemoptysis, or hematuria. With pancytopenia, suspect anemia secondary to his chemoradiation with bone marrow suppression.   EGD yesterday with severe esophagitis likely radiation-induced status post biopsy of a normal segment.  Medium hiatal hernia.  Recommend full liquid diet, continue PPI.  Can add Carafate slurry's.  Consider discontinuing Diflucan pending biopsy results.  Biopsy results are still pending this  morning.  Plan: 1. Continue PPI and Carafate 2. Can consider discontinuing Magic mouthwash with lidocaine and switch to viscous lidocaine to see if this is any more  effective 3. Supportive measures 4. Continue full liquid diet for now 5. If the patient continues with minimal to nothing by mouth and has significant weight loss we may need to broach a discussion about possible PEG tube for nutrition.  This is pending clinical progress.   Thank you for allowing [place] to participate in the care of [place], DNP, AGNP-C Adult & Gerontological Nurse Practitioner [facility]     LOS: 3 days    12/16/2018, 12:50 PM

## 2018-12-17 DIAGNOSIS — T66XXXA Radiation sickness, unspecified, initial encounter: Secondary | ICD-10-CM | POA: Diagnosis not present

## 2018-12-17 LAB — BASIC METABOLIC PANEL
Anion gap: 9 (ref 5–15)
BUN: 32 mg/dL — ABNORMAL HIGH (ref 8–23)
CO2: 25 mmol/L (ref 22–32)
Calcium: 8.4 mg/dL — ABNORMAL LOW (ref 8.9–10.3)
Chloride: 111 mmol/L (ref 98–111)
Creatinine, Ser: 1.1 mg/dL (ref 0.61–1.24)
GFR calc Af Amer: >60 mL/min (ref >60)
GFR calc non Af Amer: >60 mL/min (ref >60)
Glucose, Bld: 101 mg/dL — ABNORMAL HIGH (ref 70–99)
Potassium: 3.7 mmol/L (ref 3.5–5.1)
Sodium: 145 mmol/L (ref 135–145)

## 2018-12-17 MED ORDER — PANTOPRAZOLE SODIUM 40 MG PO TBEC
40.0000 mg | DELAYED_RELEASE_TABLET | Freq: Every day | ORAL | Status: DC
  Administered 2018-12-18 – 2018-12-26 (×9): 40 mg via ORAL
  Filled 2018-12-17 (×10): qty 1

## 2018-12-17 MED ORDER — POTASSIUM CHLORIDE 10 MEQ/100ML IV SOLN
10.0000 meq | INTRAVENOUS | Status: AC
  Administered 2018-12-17 (×4): 10 meq via INTRAVENOUS
  Filled 2018-12-17 (×4): qty 100

## 2018-12-17 NOTE — Progress Notes (Signed)
Subjective: He says he feels a little better.  He still having a lot of trouble with his esophagus and with swallowing.  He had tachycardia yesterday but his EKG did not catch any of that.  He is known to have paroxysmal atrial fib and he has a wide complex at baseline.  He is not having any cardiac complaints  Objective: Vital signs in last 24 hours: Temp:  [98.5 F (36.9 C)-100 F (37.8 C)] 99 F (37.2 C) (10/17 0807) Pulse Rate:  [69-70] 70 (10/17 0807) Resp:  [20] 20 (10/17 0807) BP: (137-163)/(61-75) 159/75 (10/17 0807) SpO2:  [93 %-94 %] 93 % (10/17 0807) Weight change:  Last BM Date: 12/15/18  Intake/Output from previous day: 10/16 0701 - 10/17 0700 In: 240 [P.O.:240] Out: 150 [Urine:150]  PHYSICAL EXAM General appearance: alert, cooperative and mild distress Resp: clear to auscultation bilaterally Cardio: regular rate and rhythm, S1, S2 normal, no murmur, click, rub or gallop GI: soft, non-tender; bowel sounds normal; no masses,  no organomegaly Extremities: extremities normal, atraumatic, no cyanosis or edema  Lab Results:  Results for orders placed or performed during the hospital encounter of 12/12/18 (from the past 48 hour(s))  CBC with Differential/Platelet     Status: Abnormal   Collection Time: 12/16/18  5:29 AM  Result Value Ref Range   WBC 1.8 (L) 4.0 - 10.5 K/uL   RBC 2.79 (L) 4.22 - 5.81 MIL/uL   Hemoglobin 8.9 (L) 13.0 - 17.0 g/dL   HCT 29.5 (L) 39.0 - 52.0 %   MCV 105.7 (H) 80.0 - 100.0 fL   MCH 31.9 26.0 - 34.0 pg   MCHC 30.2 30.0 - 36.0 g/dL   RDW 19.2 (H) 11.5 - 15.5 %   Platelets 73 (L) 150 - 400 K/uL    Comment: REPEATED TO VERIFY PLATELET COUNT CONFIRMED BY SMEAR SPECIMEN CHECKED FOR CLOTS Immature Platelet Fraction may be clinically indicated, consider ordering this additional test [id number]    nRBC 0.0 0.0 - 0.2 %   Neutrophils Relative % 72 %   Neutro Abs 1.3 (L) 1.7 - 7.7 K/uL   Lymphocytes Relative 8 %   Lymphs Abs 0.2 (L) 0.7 -  4.0 K/uL   Monocytes Relative 18 %   Monocytes Absolute 0.3 0.1 - 1.0 K/uL   Eosinophils Relative 0 %   Eosinophils Absolute 0.0 0.0 - 0.5 K/uL   Basophils Relative 1 %   Basophils Absolute 0.0 0.0 - 0.1 K/uL   Immature Granulocytes 1 %   Abs Immature Granulocytes 0.01 0.00 - 0.07 K/uL    Comment: Performed at [facility], [address], [place], [place] [address]  Basic metabolic panel     Status: Abnormal   Collection Time: 12/16/18  5:29 AM  Result Value Ref Range   Sodium 144 135 - 145 mmol/L   Potassium 3.6 3.5 - 5.1 mmol/L   Chloride 111 98 - 111 mmol/L   CO2 25 22 - 32 mmol/L   Glucose, Bld 96 70 - 99 mg/dL   BUN 34 (H) 8 - 23 mg/dL   Creatinine, Ser 1.09 0.61 - 1.24 mg/dL   Calcium 8.4 (L) 8.9 - 10.3 mg/dL   GFR calc non Af Amer >60 >60 mL/min   GFR calc Af Amer >60 >60 mL/min   Anion gap 8 5 - 15    Comment: Performed at [facility], [address] [place],  [address]  Basic metabolic panel     Status: Abnormal   Collection Time:  12/17/18  6:51 AM  Result Value Ref Range   Sodium 145 135 - 145 mmol/L   Potassium 3.7 3.5 - 5.1 mmol/L   Chloride 111 98 - 111 mmol/L   CO2 25 22 - 32 mmol/L   Glucose, Bld 101 (H) 70 - 99 mg/dL   BUN 32 (H) 8 - 23 mg/dL   Creatinine, Ser 1.10 0.61 - 1.24 mg/dL   Calcium 8.4 (L) 8.9 - 10.3 mg/dL   GFR calc non Af Amer >60 >60 mL/min   GFR calc Af Amer >60 >60 mL/min   Anion gap 9 5 - 15    Comment: Performed at [facility], [address] [place], [place] [address]    ABGS No results for input(s): PHART, PO2ART, TCO2, HCO3 in the last 72 hours.  Invalid input(s): PCO2 CULTURES Recent Results (from the past 240 hour(s))  SARS CORONAVIRUS 2 (TAT 6-24 HRS) Nasopharyngeal Nasopharyngeal Swab     Status: None   Collection Time: 12/12/18 11:43 AM   Specimen: Nasopharyngeal Swab  Result Value Ref Range Status   SARS Coronavirus 2 NEGATIVE NEGATIVE Final    Comment: (NOTE) SARS-CoV-2 target nucleic acids are NOT  DETECTED. The SARS-CoV-2 RNA is generally detectable in upper and lower respiratory specimens during the acute phase of infection. Negative results do not preclude SARS-CoV-2 infection, do not rule out co-infections with other pathogens, and should not be used as the sole basis for treatment or other patient management decisions. Negative results must be combined with clinical observations, patient history, and epidemiological information. The expected result is Negative. Fact Sheet for Patients: SugarRoll.be Fact Sheet for Healthcare Providers: https://www.woods-mathews.com/ This test is not yet approved or cleared by the [place] FDA and  has been authorized for detection and/or diagnosis of SARS-CoV-2 by FDA under an Emergency Use Authorization (EUA). This EUA will remain  in effect (meaning this test can be used) for the duration of the COVID-19 declaration under Section 56 4(b)(1) of the Act, 21 U.S.C. section 360bbb-3(b)(1), unless the authorization is terminated or revoked sooner. Performed at [facility], [place] [address], [place], [place] [address]    Studies/Results: No results found.  Medications:  Prior to Admission:  Medications Prior to Admission  Medication Sig Dispense Refill Last Dose  . acetaminophen (TYLENOL) 500 MG tablet Take 500 mg by mouth every 6 (six) hours as needed for pain.     [name] aspirin EC 81 MG tablet Take 81 mg by mouth daily.     [name] CARBOPLATIN IV Inject into the vein once a week.     . cholecalciferol (VITAMIN D) 1000 units tablet Take 1,000 Units by mouth 2 (two) times daily.     . clopidogrel (PLAVIX) 75 MG tablet Take 75 mg by mouth daily.     . dorzolamide-timolol (COSOPT) 22.3-6.8 MG/ML ophthalmic solution Place 1 drop into the left eye 2 (two) times daily.     . ferrous sulfate 325 (65 FE) MG tablet Take 325 mg by mouth daily.      . hydrochlorothiazide (HYDRODIURIL) 25 MG tablet TAKE ONE  (1) TABLET BY MOUTH EVERY DAY (Patient taking differently: Take 25 mg by mouth daily. ) 90 tablet 3   . lidocaine-prilocaine (EMLA) cream Apply a dime-sized amount to port a cath site and cover with plastic wrap one hour prior to chemotherapy appointments 30 g 0   . losartan (COZAAR) 25 MG tablet TAKE ONE TABLET BY MOUTY EVERY DAY (Patient taking differently: Take 25 mg  by mouth daily. ) 90 tablet 1   . lovastatin (MEVACOR) 10 MG tablet TAKE ONE (1) TABLET BY MOUTH EVERY DAY 90 tablet 3   . metoprolol succinate (TOPROL XL) 25 MG 24 hr tablet Take 1 tablet (25 mg total) by mouth daily. 90 tablet 3   . ondansetron (ZOFRAN ODT) 8 MG disintegrating tablet Take 1 tablet (8 mg total) by mouth 2 (two) times daily as needed for nausea or vomiting. (Patient not taking: Reported on 11/28/2018) 60 tablet 3   . PACLITAXEL IV Inject into the vein once a week.     . pantoprazole (PROTONIX) 40 MG tablet Take 40 mg by mouth daily.     . prochlorperazine (COMPAZINE) 10 MG tablet Take 1 tablet (10 mg total) by mouth every 6 (six) hours as needed for nausea or vomiting. (Patient not taking: Reported on 11/28/2018) 30 tablet 1   . Propylene Glycol (SYSTANE BALANCE OP) Place 1 drop into both eyes 2 (two) times daily as needed (dry eyes).      [name] scopolamine (TRANSDERM-SCOP) 1 MG/3DAYS Place 1 patch (1.5 mg total) onto the skin every 3 (three) days. (Patient not taking: Reported on 11/28/2018) 10 patch 12   . tamsulosin (FLOMAX) 0.4 MG CAPS capsule Take 0.4 mg by mouth daily.     [name] triamcinolone cream (KENALOG) 0.1 % Apply 1 application topically daily as needed (itching).      . vitamin B-12 (CYANOCOBALAMIN) 1000 MCG tablet Take 1,000 mcg by mouth daily.      Scheduled: . sodium chloride   Intravenous Once  . aspirin EC  81 mg Oral Daily  . Chlorhexidine Gluconate Cloth  6 each Topical Daily  . cholecalciferol  1,000 Units Oral BID  . dorzolamide-timolol  1 drop Left Eye BID  . feeding supplement  1 Container Oral TID  BM  . magic mouthwash w/lidocaine  10 mL Oral TID  . metoprolol succinate  25 mg Oral Daily  . oxyCODONE-acetaminophen  1 tablet Oral TID  . pantoprazole (PROTONIX) IV  40 mg Intravenous Q12H  . sucralfate  1 g Oral TID WC & HS  . tamsulosin  0.4 mg Oral Daily  . vitamin B-12  1,000 mcg Oral Daily   Continuous: . sodium chloride 75 mL/hr at 12/13/18 1524  . fluconazole (DIFLUCAN) IV 200 mg (12/16/18 2110)   UXN:[id number], lidocaine, ondansetron **OR** ondansetron (ZOFRAN) IV  Assesment: He has odynophagia from radiation.  He has severe radiation esophagitis.  He may be getting a little better.  He has squamous cell carcinoma of the lung and he is received chemotherapy and radiation now.  When he arrived he was dehydrated and had acute kidney injury and hypokalemia.  He continues to have odynophagia but it is a little bit better.  He is weak and it has been recommended that he go to skilled care facility for rehab and he is not sure if he wants to do that. Active Problems:   Squamous cell carcinoma of lung (HCC)   Weakness   Odynophagia   AKI (acute kidney injury) ([place])   Radiation-induced esophagitis   Dehydration   Hypokalemia    Plan: Continue current treatments    LOS: 4 days   Alonza Bogus 12/17/2018, 9:08 AM

## 2018-12-17 NOTE — Progress Notes (Signed)
   Assessment/Plan: ADMITTED WITH RADIATION ESOPHAGITIS. CLINICALLY IMPROVED.  PLAN: 1. FULL LIQUIDS. 2. VISCOUS LIDOCAINE PRN 3. PERCOCET TID. 4. D/C DIFLUCAN. 5. CHANGE PROTONIX TO PO QAM.   Subjective: Since I last evaluated the patient HE HAD TWO BAD SPELLS BUT VISCOUS LIDOCAINE HELPED. WATER NOT BOTHERING HIM LIKE IT WAS. FEEL BETTER TODAY. BMs: LOOSE/SOFT SOME YESTERDAY. NO BM TODAY YET.  Objective: Vital signs in last 24 hours: Vitals:   12/17/18 0555 12/17/18 0807  BP: 137/61 (!) 159/75  Pulse: 70 70  Resp: 20 20  Temp: 100 F (37.8 C) 99 F (37.2 C)  SpO2: 94% 93%   General appearance: alert, cooperative and no distress Resp: clear to auscultation bilaterally Cardio: regular rate and rhythm GI: soft, non-tender; bowel sounds normal;   Lab Results: Cr 1.10  Studies/Results: No results found.  Medications: I have reviewed the patient's current medications.

## 2018-12-18 LAB — BASIC METABOLIC PANEL
Anion gap: 7 (ref 5–15)
BUN: 33 mg/dL — ABNORMAL HIGH (ref 8–23)
CO2: 27 mmol/L (ref 22–32)
Calcium: 8.2 mg/dL — ABNORMAL LOW (ref 8.9–10.3)
Chloride: 110 mmol/L (ref 98–111)
Creatinine, Ser: 0.89 mg/dL (ref 0.61–1.24)
GFR calc Af Amer: >60 mL/min (ref >60)
GFR calc non Af Amer: >60 mL/min (ref >60)
Glucose, Bld: 96 mg/dL (ref 70–99)
Potassium: 3.9 mmol/L (ref 3.5–5.1)
Sodium: 144 mmol/L (ref 135–145)

## 2018-12-18 LAB — CBC WITH DIFFERENTIAL/PLATELET
Abs Immature Granulocytes: 0.01 10*3/uL (ref 0.00–0.07)
Basophils Absolute: 0 10*3/uL (ref 0.0–0.1)
Basophils Relative: 0 %
Eosinophils Absolute: 0 10*3/uL (ref 0.0–0.5)
Eosinophils Relative: 0 %
HCT: 29.3 % — ABNORMAL LOW (ref 39.0–52.0)
Hemoglobin: 8.9 g/dL — ABNORMAL LOW (ref 13.0–17.0)
Immature Granulocytes: 0 %
Lymphocytes Relative: 7 %
Lymphs Abs: 0.2 10*3/uL — ABNORMAL LOW (ref 0.7–4.0)
MCH: 32.4 pg (ref 26.0–34.0)
MCHC: 30.4 g/dL (ref 30.0–36.0)
MCV: 106.5 fL — ABNORMAL HIGH (ref 80.0–100.0)
Monocytes Absolute: 0.3 10*3/uL (ref 0.1–1.0)
Monocytes Relative: 11 %
Neutro Abs: 1.9 10*3/uL (ref 1.7–7.7)
Neutrophils Relative %: 82 %
Platelets: 68 10*3/uL — ABNORMAL LOW (ref 150–400)
RBC: 2.75 MIL/uL — ABNORMAL LOW (ref 4.22–5.81)
RDW: 19.6 % — ABNORMAL HIGH (ref 11.5–15.5)
WBC: 2.4 10*3/uL — ABNORMAL LOW (ref 4.0–10.5)
nRBC: 0 % (ref 0.0–0.2)

## 2018-12-18 LAB — MAGNESIUM: Magnesium: 2 mg/dL (ref 1.7–2.4)

## 2018-12-18 NOTE — Progress Notes (Addendum)
Subjective: He says he feels some better.  He is still having episodes of pain in his esophagus but it is improving.  Viscous lidocaine is helping.  He still has trouble swallowing anything that is not water consistency.  Objective: Vital signs in last 24 hours: Temp:  [97.5 F (36.4 C)-98.2 F (36.8 C)] 97.5 F (36.4 C) (10/18 0424) Pulse Rate:  [69-71] 71 (10/18 0424) Resp:  [18] 18 (10/18 0424) BP: (128-134)/(66-76) 134/66 (10/18 0424) SpO2:  [92 %-97 %] 92 % (10/18 0424) Weight change:  Last BM Date: 12/15/18  Intake/Output from previous day: 10/17 0701 - 10/18 0700 In: 1998.3 [P.O.:1080; I.V.:530.1; IV Piggyback:388.3] Out: -   PHYSICAL EXAM General appearance: alert, cooperative and mild distress Resp: clear to auscultation bilaterally Cardio: regular rate and rhythm, S1, S2 normal, no murmur, click, rub or gallop GI: soft, non-tender; bowel sounds normal; no masses,  no organomegaly Extremities: extremities normal, atraumatic, no cyanosis or edema  Lab Results:  Results for orders placed or performed during the hospital encounter of 12/12/18 (from the past 48 hour(s))  Basic metabolic panel     Status: Abnormal   Collection Time: 12/17/18  6:51 AM  Result Value Ref Range   Sodium 145 135 - 145 mmol/L   Potassium 3.7 3.5 - 5.1 mmol/L   Chloride 111 98 - 111 mmol/L   CO2 25 22 - 32 mmol/L   Glucose, Bld 101 (H) 70 - 99 mg/dL   BUN 32 (H) 8 - 23 mg/dL   Creatinine, Ser 1.10 0.61 - 1.24 mg/dL   Calcium 8.4 (L) 8.9 - 10.3 mg/dL   GFR calc non Af Amer >60 >60 mL/min   GFR calc Af Amer >60 >60 mL/min   Anion gap 9 5 - 15    Comment: Performed at [facility], [address] [place], [place] [address]  Basic metabolic panel     Status: Abnormal   Collection Time: 12/18/18  6:26 AM  Result Value Ref Range   Sodium 144 135 - 145 mmol/L   Potassium 3.9 3.5 - 5.1 mmol/L   Chloride 110 98 - 111 mmol/L   CO2 27 22 - 32 mmol/L   Glucose, Bld 96 70 - 99 mg/dL   BUN 33 (H)  8 - 23 mg/dL   Creatinine, Ser 0.89 0.61 - 1.24 mg/dL   Calcium 8.2 (L) 8.9 - 10.3 mg/dL   GFR calc non Af Amer >60 >60 mL/min   GFR calc Af Amer >60 >60 mL/min   Anion gap 7 5 - 15    Comment: Performed at [facility], [address] [place], [place] [address]  CBC with Differential/Platelet     Status: Abnormal   Collection Time: 12/18/18  6:26 AM  Result Value Ref Range   WBC 2.4 (L) 4.0 - 10.5 K/uL   RBC 2.75 (L) 4.22 - 5.81 MIL/uL   Hemoglobin 8.9 (L) 13.0 - 17.0 g/dL   HCT 29.3 (L) 39.0 - 52.0 %   MCV 106.5 (H) 80.0 - 100.0 fL   MCH 32.4 26.0 - 34.0 pg   MCHC 30.4 30.0 - 36.0 g/dL   RDW 19.6 (H) 11.5 - 15.5 %   Platelets 68 (L) 150 - 400 K/uL    Comment: REPEATED TO VERIFY SPECIMEN CHECKED FOR CLOTS Immature Platelet Fraction may be clinically indicated, consider ordering this additional test [id number] CONSISTENT WITH PREVIOUS RESULT    nRBC 0.0 0.0 - 0.2 %   Neutrophils Relative % 82 %  Neutro Abs 1.9 1.7 - 7.7 K/uL   Lymphocytes Relative 7 %   Lymphs Abs 0.2 (L) 0.7 - 4.0 K/uL   Monocytes Relative 11 %   Monocytes Absolute 0.3 0.1 - 1.0 K/uL   Eosinophils Relative 0 %   Eosinophils Absolute 0.0 0.0 - 0.5 K/uL   Basophils Relative 0 %   Basophils Absolute 0.0 0.0 - 0.1 K/uL   Immature Granulocytes 0 %   Abs Immature Granulocytes 0.01 0.00 - 0.07 K/uL    Comment: Performed at [facility], [address] [place], [place] [address]  Magnesium     Status: None   Collection Time: 12/18/18  6:26 AM  Result Value Ref Range   Magnesium 2.0 1.7 - 2.4 mg/dL    Comment: Performed at [facility], [address], [place], [place] [address]    ABGS No results for input(s): PHART, PO2ART, TCO2, HCO3 in the last 72 hours.  Invalid input(s): PCO2 CULTURES Recent Results (from the past 240 hour(s))  SARS CORONAVIRUS 2 (TAT 6-24 HRS) Nasopharyngeal Nasopharyngeal Swab     Status: None   Collection Time: 12/12/18 11:43 AM   Specimen: Nasopharyngeal Swab  Result Value Ref  Range Status   SARS Coronavirus 2 NEGATIVE NEGATIVE Final    Comment: (NOTE) SARS-CoV-2 target nucleic acids are NOT DETECTED. The SARS-CoV-2 RNA is generally detectable in upper and lower respiratory specimens during the acute phase of infection. Negative results do not preclude SARS-CoV-2 infection, do not rule out co-infections with other pathogens, and should not be used as the sole basis for treatment or other patient management decisions. Negative results must be combined with clinical observations, patient history, and epidemiological information. The expected result is Negative. Fact Sheet for Patients: SugarRoll.be Fact Sheet for Healthcare Providers: https://www.woods-mathews.com/ This test is not yet approved or cleared by the [place] FDA and  has been authorized for detection and/or diagnosis of SARS-CoV-2 by FDA under an Emergency Use Authorization (EUA). This EUA will remain  in effect (meaning this test can be used) for the duration of the COVID-19 declaration under Section 56 4(b)(1) of the Act, 21 U.S.C. section 360bbb-3(b)(1), unless the authorization is terminated or revoked sooner. Performed at [facility], [place] [address], [place], [place] [address]    Studies/Results: No results found.  Medications:  Prior to Admission:  Medications Prior to Admission  Medication Sig Dispense Refill Last Dose  . acetaminophen (TYLENOL) 500 MG tablet Take 500 mg by mouth every 6 (six) hours as needed for pain.     [name] aspirin EC 81 MG tablet Take 81 mg by mouth daily.     [name] CARBOPLATIN IV Inject into the vein once a week.     . cholecalciferol (VITAMIN D) 1000 units tablet Take 1,000 Units by mouth 2 (two) times daily.     . clopidogrel (PLAVIX) 75 MG tablet Take 75 mg by mouth daily.     . dorzolamide-timolol (COSOPT) 22.3-6.8 MG/ML ophthalmic solution Place 1 drop into the left eye 2 (two) times daily.     . ferrous  sulfate 325 (65 FE) MG tablet Take 325 mg by mouth daily.      . hydrochlorothiazide (HYDRODIURIL) 25 MG tablet TAKE ONE (1) TABLET BY MOUTH EVERY DAY (Patient taking differently: Take 25 mg by mouth daily. ) 90 tablet 3   . lidocaine-prilocaine (EMLA) cream Apply a dime-sized amount to port a cath site and cover with plastic wrap one hour prior to chemotherapy appointments 30  g 0   . losartan (COZAAR) 25 MG tablet TAKE ONE TABLET BY MOUTY EVERY DAY (Patient taking differently: Take 25 mg by mouth daily. ) 90 tablet 1   . lovastatin (MEVACOR) 10 MG tablet TAKE ONE (1) TABLET BY MOUTH EVERY DAY 90 tablet 3   . metoprolol succinate (TOPROL XL) 25 MG 24 hr tablet Take 1 tablet (25 mg total) by mouth daily. 90 tablet 3   . ondansetron (ZOFRAN ODT) 8 MG disintegrating tablet Take 1 tablet (8 mg total) by mouth 2 (two) times daily as needed for nausea or vomiting. (Patient not taking: Reported on 11/28/2018) 60 tablet 3   . PACLITAXEL IV Inject into the vein once a week.     . pantoprazole (PROTONIX) 40 MG tablet Take 40 mg by mouth daily.     . prochlorperazine (COMPAZINE) 10 MG tablet Take 1 tablet (10 mg total) by mouth every 6 (six) hours as needed for nausea or vomiting. (Patient not taking: Reported on 11/28/2018) 30 tablet 1   . Propylene Glycol (SYSTANE BALANCE OP) Place 1 drop into both eyes 2 (two) times daily as needed (dry eyes).      [name] scopolamine (TRANSDERM-SCOP) 1 MG/3DAYS Place 1 patch (1.5 mg total) onto the skin every 3 (three) days. (Patient not taking: Reported on 11/28/2018) 10 patch 12   . tamsulosin (FLOMAX) 0.4 MG CAPS capsule Take 0.4 mg by mouth daily.     [name] triamcinolone cream (KENALOG) 0.1 % Apply 1 application topically daily as needed (itching).      . vitamin B-12 (CYANOCOBALAMIN) 1000 MCG tablet Take 1,000 mcg by mouth daily.      Scheduled: . sodium chloride   Intravenous Once  . aspirin EC  81 mg Oral Daily  . Chlorhexidine Gluconate Cloth  6 each Topical Daily  .  cholecalciferol  1,000 Units Oral BID  . dorzolamide-timolol  1 drop Left Eye BID  . feeding supplement  1 Container Oral TID BM  . magic mouthwash w/lidocaine  10 mL Oral TID  . metoprolol succinate  25 mg Oral Daily  . oxyCODONE-acetaminophen  1 tablet Oral TID  . pantoprazole  40 mg Oral QAC breakfast  . sucralfate  1 g Oral TID WC & HS  . tamsulosin  0.4 mg Oral Daily  . vitamin B-12  1,000 mcg Oral Daily   Continuous: . sodium chloride 75 mL/hr at 12/18/18 1251   OHF:[id number], lidocaine, ondansetron **OR** ondansetron (ZOFRAN) IV  Assesment: He was admitted with odynophagia which is likely from radiation-induced esophagitis.  He is slowly improving.  At baseline he is squamous cell carcinoma of the lung and is being treated with chemotherapy and now has received radiation  He has pancytopenia from his treatment and that is improving but he still has low platelets Active Problems:   Squamous cell carcinoma of lung (HCC)   Weakness   Odynophagia   AKI (acute kidney injury) ([place])   Radiation-induced esophagitis   Dehydration   Hypokalemia    Plan: Continue current treatments.  I think he has agreed to go to skilled care facility  for rehab at discharge.  Will confirm tomorrow.  Potentially could go tomorrow if she continues to improve clinically and if arrangements can be made    LOS: 5 days   Alonza Bogus 12/18/2018, 1:53 PM

## 2018-12-19 ENCOUNTER — Ambulatory Visit (HOSPITAL_COMMUNITY): Payer: Medicare Other

## 2018-12-19 ENCOUNTER — Encounter: Payer: Self-pay | Admitting: Internal Medicine

## 2018-12-19 ENCOUNTER — Encounter (HOSPITAL_COMMUNITY): Payer: Self-pay | Admitting: Internal Medicine

## 2018-12-19 ENCOUNTER — Other Ambulatory Visit (HOSPITAL_COMMUNITY): Payer: Medicare Other

## 2018-12-19 LAB — CBC WITH DIFFERENTIAL/PLATELET
Abs Immature Granulocytes: 0.01 10*3/uL (ref 0.00–0.07)
Basophils Absolute: 0 10*3/uL (ref 0.0–0.1)
Basophils Relative: 1 %
Eosinophils Absolute: 0 10*3/uL (ref 0.0–0.5)
Eosinophils Relative: 1 %
HCT: 29.7 % — ABNORMAL LOW (ref 39.0–52.0)
Hemoglobin: 8.9 g/dL — ABNORMAL LOW (ref 13.0–17.0)
Immature Granulocytes: 1 %
Lymphocytes Relative: 8 %
Lymphs Abs: 0.2 10*3/uL — ABNORMAL LOW (ref 0.7–4.0)
MCH: 32.1 pg (ref 26.0–34.0)
MCHC: 30 g/dL (ref 30.0–36.0)
MCV: 107.2 fL — ABNORMAL HIGH (ref 80.0–100.0)
Monocytes Absolute: 0.2 10*3/uL (ref 0.1–1.0)
Monocytes Relative: 10 %
Neutro Abs: 1.8 10*3/uL (ref 1.7–7.7)
Neutrophils Relative %: 79 %
Platelets: 66 10*3/uL — ABNORMAL LOW (ref 150–400)
RBC: 2.77 MIL/uL — ABNORMAL LOW (ref 4.22–5.81)
RDW: 19.5 % — ABNORMAL HIGH (ref 11.5–15.5)
WBC: 2.2 10*3/uL — ABNORMAL LOW (ref 4.0–10.5)
nRBC: 0 % (ref 0.0–0.2)

## 2018-12-19 LAB — BASIC METABOLIC PANEL
Anion gap: 7 (ref 5–15)
BUN: 28 mg/dL — ABNORMAL HIGH (ref 8–23)
CO2: 27 mmol/L (ref 22–32)
Calcium: 8.1 mg/dL — ABNORMAL LOW (ref 8.9–10.3)
Chloride: 111 mmol/L (ref 98–111)
Creatinine, Ser: 0.93 mg/dL (ref 0.61–1.24)
GFR calc Af Amer: >60 mL/min (ref >60)
GFR calc non Af Amer: >60 mL/min (ref >60)
Glucose, Bld: 98 mg/dL (ref 70–99)
Potassium: 4 mmol/L (ref 3.5–5.1)
Sodium: 145 mmol/L (ref 135–145)

## 2018-12-19 LAB — SURGICAL PATHOLOGY

## 2018-12-19 MED ORDER — OXYCODONE-ACETAMINOPHEN 5-325 MG PO TABS
1.0000 | ORAL_TABLET | Freq: Three times a day (TID) | ORAL | Status: DC | PRN
  Administered 2018-12-19: 09:00:00 1 via ORAL

## 2018-12-19 MED ORDER — METOPROLOL SUCCINATE ER 50 MG PO TB24
50.0000 mg | ORAL_TABLET | Freq: Every day | ORAL | Status: DC
  Administered 2018-12-20 – 2018-12-26 (×7): 50 mg via ORAL
  Filled 2018-12-19 (×7): qty 1

## 2018-12-19 MED ORDER — METOPROLOL SUCCINATE ER 25 MG PO TB24
25.0000 mg | ORAL_TABLET | Freq: Once | ORAL | Status: AC
  Administered 2018-12-19: 25 mg via ORAL
  Filled 2018-12-19: qty 1

## 2018-12-19 NOTE — Progress Notes (Signed)
Physical Therapy Treatment Patient Details Name: Nicholas Sanchez MRN: [id number] DOB: 1933/11/15 Today's Date: 12/19/2018    History of Present Illness BRAVE DACK is a 85 y.o. male with medical history significant for stage III squamous cell lung cancer currently on radiation treatment, CAD with prior MI, paroxysmal atrial fibrillation currently off Xarelto, hypertension, GERD, and recent weight loss who presented to the ED at the urging of his oncologist due to worsening dehydration and soft blood pressure readings.  He has had some generalized worsening weakness over the last 1 week and has had poor appetite as well as reduced p.o. intake on account of odynophagia.  He is also had some mild epigastric abdominal tenderness, but no nausea, vomiting, or diarrhea.  No fevers or chills noted.  He denies any chest pain, cough, shortness of breathing, or hemoptysis.  He states that he tries to take his Magic mouthwash at home on account of his ongoing symptoms, but this has not been helping very much.    PT Comments     Patient requires frequent verbal cueing to perform exercises with proper mechanics. He performs exercise/activities today very slowly and requires frequent rest breaks. He continues to be most limited secondary to fatigue. He continues to require assist as listed below. Patient will benefit from continued physical therapy in hospital and recommended venue below to increase strength, balance, endurance for safe ADLs and gait.   Follow Up Recommendations  SNF;Supervision for mobility/OOB;Supervision - Intermittent     Equipment Recommendations  None recommended by PT    Recommendations for Other Services       Precautions / Restrictions Precautions Precautions: Fall Restrictions Weight Bearing Restrictions: No    Mobility  Bed Mobility Overal bed mobility: Modified Independent             General bed mobility comments: slow, labored  Transfers Overall  transfer level: Needs assistance Equipment used: Rolling walker (2 wheeled) Transfers: Sit to/from Stand Sit to Stand: Min assist         General transfer comment: required min verbal cueing for hand placement with sit to stand, transfer sit to stand from bed and from toilet  Ambulation/Gait Ambulation/Gait assistance: Min assist Gait Distance (Feet): 20 Feet Assistive device: Rolling walker (2 wheeled) Gait Pattern/deviations: Decreased step length - right;Decreased step length - left;Decreased stride length Gait velocity: slow   General Gait Details: slow labored cadence, limited secondary to c/o fatigue, no loss of balance   Stairs             Wheelchair Mobility    Modified Rankin (Stroke Patients Only)       Balance Overall balance assessment: Needs assistance Sitting-balance support: Feet supported;No upper extremity supported Sitting balance-Leahy Scale: Good Sitting balance - Comments: seated at bedside   Standing balance support: During functional activity;Bilateral upper extremity supported Standing balance-Leahy Scale: Fair Standing balance comment: using RW, unsteady with dynamic UE use                            Cognition Arousal/Alertness: Awake/alert Behavior During Therapy: WFL for tasks assessed/performed Overall Cognitive Status: Within Functional Limits for tasks assessed                                        Exercises General Exercises - Lower Extremity Long Arc Quad: AROM;Seated;Both;15 reps Hip Flexion/Marching:  AROM;Seated;Both;15 reps Toe Raises: AROM;Seated;Both;15 reps Heel Raises: AROM;Seated;Both;15 reps    General Comments        Pertinent Vitals/Pain Pain Assessment: No/denies pain    Home Living                      Prior Function            PT Goals (current goals can now be found in the care plan section) Acute Rehab PT Goals Patient Stated Goal: return home with family to  assist PT Goal Formulation: With patient Time For Goal Achievement: 12/27/18 Potential to Achieve Goals: Good Progress towards PT goals: Progressing toward goals    Frequency    Min 3X/week      PT Plan Current plan remains appropriate    Co-evaluation              AM-PAC PT "6 Clicks" Mobility   Outcome Measure  Help needed turning from your back to your side while in a flat bed without using bedrails?: None Help needed moving from lying on your back to sitting on the side of a flat bed without using bedrails?: None Help needed moving to and from a bed to a chair (including a wheelchair)?: A Little Help needed standing up from a chair using your arms (e.g., wheelchair or bedside chair)?: A Little Help needed to walk in hospital room?: A Little Help needed climbing 3-5 steps with a railing? : A Lot 6 Click Score: 19    End of Session Equipment Utilized During Treatment: Gait belt Activity Tolerance: Patient tolerated treatment well;Patient limited by fatigue Patient left: in bed;with call bell/phone within reach Nurse Communication: Mobility status PT Visit Diagnosis: Unsteadiness on feet (R26.81);Other abnormalities of gait and mobility (R26.89);Muscle weakness (generalized) (M62.81)     Time: 1115-5208 PT Time Calculation (min) (ACUTE ONLY): 39 min  Charges:  $Therapeutic Exercise: 8-22 mins $Therapeutic Activity: 23-37 mins                     3:12 PM, 12/19/18 Mearl Latin PT, DPT Physical Therapist at [facility]

## 2018-12-19 NOTE — Progress Notes (Addendum)
    Subjective: Painful swallowing improving. Still with discomfort upper abdomen with liquids at times. Likes chicken broth and ice cream. Unable to advance to ful liquids yet.   Objective: Vital signs in last 24 hours: Temp:  [97.9 F (36.6 C)-98.4 F (36.9 C)] 97.9 F (36.6 C) (10/19 0505) Pulse Rate:  [63-69] 63 (10/19 0505) Resp:  [18] 18 (10/19 0505) BP: (124-154)/(63-68) 154/68 (10/19 0505) SpO2:  [93 %-96 %] 93 % (10/19 0505) Last BM Date: 12/15/18 General:   Alert and oriented, pleasant Head:  Normocephalic and atraumatic.  Abdomen:  Bowel sounds present, soft, non-tender, non-distended.     Extremities:  Without edema. Neurologic:  Alert and  oriented x4  Intake/Output from previous day: 10/18 0701 - 10/19 0700 In: 3487.9 [P.O.:840; I.V.:2647.9] Out: -  Intake/Output this shift: Total I/O In: 240 [P.O.:240] Out: -   Lab Results: Recent Labs    12/18/18 0626 12/19/18 0500  WBC 2.4* 2.2*  HGB 8.9* 8.9*  HCT 29.3* 29.7*  PLT 68* 66*   BMET Recent Labs    12/17/18 0651 12/18/18 0626 12/19/18 0500  NA 145 144 145  K 3.7 3.9 4.0  CL 111 110 111  CO2 25 27 27   GLUCOSE 101* 96 98  BUN 32* 33* 28*  CREATININE 1.10 0.89 0.93  CALCIUM 8.4* 8.2* 8.1*    Assessment: 85 year old male with squamous cell carcinoma of left lung, undergoing chemo and radiation, now admitted with radiation-induced esophagitis. Clinically improving slowly. Difficulty thick liquids but tolerating clears and ice cream.    Plan: Full liquids Magic mouthwash TID Viscous lidocaine prn PPI each morning Continue with supportive measures  Annitta Needs, PhD, ANP-BC [facility] G[facility]    LOS: 6 days    12/19/2018, 10:47 AM

## 2018-12-19 NOTE — TOC Progression Note (Signed)
Transition of Care [facility]) - Progression Note    Patient Details  Name: POLO MCMARTIN MRN: [id number] Date of Birth: 1933-09-29  Transition of Care [facility]) CM/SW Contact  Shade Flood, LCSW Phone Number: 12/19/2018, 2:19 PM  Clinical Narrative:     TOC following. Spoke with pt this AM. He is agreeable to SNF if his dtr and wife agree. Spoke with wife and also dtr by phone. They would like SNF in [place]. Will refer and will follow for dc planning.  Expected Discharge Plan: Home/Self Care Barriers to Discharge: Continued Medical Work up  Expected Discharge Plan and Services Expected Discharge Plan: Home/Self Care   Discharge Planning Services: CM Consult   Living arrangements for the past 2 months: Single Family Home                                       Social Determinants of Health (SDOH) Interventions    Readmission Risk Interventions Readmission Risk Prevention Plan 12/13/2018  Transportation Screening Complete  Social Work Consult for [place] Planning/Counseling Complete  Medication Review [employer]) Complete  Some recent data might be hidden

## 2018-12-19 NOTE — Care Management Important Message (Signed)
Important Message  Patient Details  Name: Nicholas Sanchez MRN: [id number] Date of Birth: Mar 01, 1934   Medicare Important Message Given:  Yes(given to RN to deliver to patient due to contact precaution)     Tommy Medal 12/19/2018, 1:58 PM

## 2018-12-19 NOTE — Progress Notes (Signed)
Tele called r/t patient in v tach at 1100.  Dr. Luan Pulling notified and d/t history of a fib no new orders at this time. Will cont to monitor.

## 2018-12-19 NOTE — NC FL2 (Signed)
[place] MEDICAID FL2 LEVEL OF CARE SCREENING TOOL     IDENTIFICATION  Patient Name: Nicholas Sanchez Birthdate: 03/30/1933 Sex: male Admission Date (Current Location): 12/12/2018  [facility] and [place] Number:  [employer] and Address:  [place] [address] [place]      Provider Number: [id number]  Attending Physician Name and Address:  Sinda Du, MD  Relative Name and Phone Number:       Current Level of Care: Hospital Recommended Level of Care: [place] Prior Approval Number:    Date Approved/Denied:   PASRR Number:    Discharge Plan: SNF    Current Diagnoses: Patient Active Problem List   Diagnosis Date Noted  . Dehydration   . Hypokalemia   . AKI (acute kidney injury) ([place])   . Radiation-induced esophagitis   . Weakness 12/12/2018  . Odynophagia   . Neutropenia, drug-induced ([place]) 12/05/2018  . Port-A-Cath in place 11/21/2018  . Hemoptysis 11/16/2018  . Anticoagulant long-term use 11/16/2018  . Hyponatremia 11/16/2018  . Goals of care, counseling/discussion 11/16/2018  . Squamous cell carcinoma of lung ([place]) 11/09/2018  . Mass of lower lobe of left lung 10/26/2018  . Hx of adenomatous colonic polyps 11/09/2017  . Angina at rest [facility]) 10/20/2017  . NSTEMI (non-ST elevated myocardial infarction) ([place]) 10/20/2017  . IDA (iron deficiency anemia) 08/04/2017  . Dysphagia 08/04/2017  . Paroxysmal atrial fibrillation ([place]) 01/21/2016  . Heart block 01/08/2016  . CAD (coronary artery disease)   . HTN (hypertension)   . Hypercholesteremia   . GERD (gastroesophageal reflux disease)     Orientation RESPIRATION BLADDER Height & Weight     Self, Situation, Place, Time  Normal Continent Weight: 199 lb 4.7 oz (90.4 kg) Height:  6\' 1"  (185.4 cm)  BEHAVIORAL SYMPTOMS/MOOD NEUROLOGICAL BOWEL NUTRITION STATUS      Continent Diet(see dc summary)  AMBULATORY STATUS COMMUNICATION OF NEEDS Skin    Extensive Assist Verbally Normal                       Personal Care Assistance Level of Assistance  Bathing, Feeding, Dressing Bathing Assistance: Limited assistance Feeding assistance: Independent Dressing Assistance: Limited assistance     Functional Limitations Info  Sight, Hearing, Speech Sight Info: Adequate Hearing Info: Adequate Speech Info: Adequate    SPECIAL CARE FACTORS FREQUENCY  PT (By licensed PT), OT (By licensed OT)     PT Frequency: 5 times week OT Frequency: 3 times week            Contractures Contractures Info: Not present    Additional Factors Info  Code Status, Allergies Code Status Info: full Allergies Info: Ace Inhibitors, Tape           Current Medications (12/19/2018):  This is the current hospital active medication list Current Facility-Administered Medications  Medication Dose Route Frequency Provider Last Rate Last Dose  . 0.9 %  sodium chloride infusion (Manually program via Guardrails IV Fluids)   Intravenous Once [name], MD      . 0.9 %  sodium chloride infusion   Intravenous Continuous [name], MD 75 mL/hr at 12/19/18 0230    . acetaminophen (TYLENOL) tablet 500 mg  500 mg Oral Q6H PRN [name], MD   500 mg at 12/19/18 0912  . aspirin EC tablet 81 mg  81 mg Oral Daily [name], MD   81 mg at 12/19/18 9937  .  Chlorhexidine Gluconate Cloth 2 % PADS 6 each  6 each Topical Daily [name], MD   6 each at 12/19/18 0914  . cholecalciferol (VITAMIN D3) tablet 1,000 Units  1,000 Units Oral BID [name], MD   1,000 Units at 12/19/18 0912  . dorzolamide-timolol (COSOPT) 22.3-6.8 MG/ML ophthalmic solution 1 drop  1 drop Left Eye BID [name], MD   1 drop at 12/19/18 0914  . feeding supplement (BOOST / RESOURCE BREEZE) liquid 1 Container  1 Container Oral TID BM [name], MD   1 Container at 12/19/18 1334  . lidocaine (XYLOCAINE) 2 % viscous mouth solution 15 mL  15 mL Mouth/Throat  Q3H PRN Sinda Du, MD   15 mL at 12/17/18 0821  . magic mouthwash w/lidocaine  10 mL Oral TID [name], MD   10 mL at 12/19/18 0913  . [START ON 12/20/2018] metoprolol succinate (TOPROL-XL) 24 hr tablet 50 mg  50 mg Oral Daily Arnoldo Lenis, MD      . ondansetron [facility]) tablet 4 mg  4 mg Oral Q6H PRN [name], MD       Or  . ondansetron [facility]) injection 4 mg  4 mg Intravenous Q6H PRN [name], MD   4 mg at 12/15/18 0846  . oxyCODONE-acetaminophen (PERCOCET/ROXICET) 5-325 MG per tablet 1 tablet  1 tablet Oral Q8H PRN Sinda Du, MD   1 tablet at 12/19/18 0913  . pantoprazole (PROTONIX) EC tablet 40 mg  40 mg Oral QAC breakfast Barney Drain L, MD   40 mg at 12/19/18 0912  . sucralfate (CARAFATE) 1 GM/10ML suspension 1 g  1 g Oral TID WC & HS Rourk, Cristopher Estimable, MD   1 g at 12/19/18 1227  . tamsulosin (FLOMAX) capsule 0.4 mg  0.4 mg Oral Daily [name], MD   0.4 mg at 12/18/18 2102  . vitamin B-12 (CYANOCOBALAMIN) tablet 1,000 mcg  1,000 mcg Oral Daily [name], MD   1,000 mcg at 12/19/18 9150     Discharge Medications: Please see discharge summary for a list of discharge medications.  Relevant Imaging Results:  Relevant Lab Results:   Additional Information SSN: [id number], [place]

## 2018-12-19 NOTE — Progress Notes (Signed)
Telemetry reviewed, wide complex irregular rhythm consistent with afib with aberrancy. Clear R-R variability throughout the wide complex rhythm, not consistent with VT. Patient prior prior history of afib, has pacemaker. Would increase his toprol to 25mg  bid, we will monitor tele. Keep K and Mg at 4. We will follow up tele tomorrow but otherwise no additional cardiac recs, admitted with esophagitis   Carlyle Dolly MD

## 2018-12-19 NOTE — Progress Notes (Signed)
Subjective: He says he feels better.  He is still tolerating thin liquids but not doing as well with thick liquids.  He does agree to go to rehab if needed.  He still has pain in his esophagus but it does seem to be better  Objective: Vital signs in last 24 hours: Temp:  [97.9 F (36.6 C)-98.4 F (36.9 C)] 97.9 F (36.6 C) (10/19 0505) Pulse Rate:  [63-69] 63 (10/19 0505) Resp:  [18] 18 (10/19 0505) BP: (124-154)/(63-68) 154/68 (10/19 0505) SpO2:  [93 %-96 %] 93 % (10/19 0505) Weight change:  Last BM Date: 12/15/18  Intake/Output from previous day: 10/18 0701 - 10/19 0700 In: 3487.9 [P.O.:840; I.V.:2647.9] Out: -   PHYSICAL EXAM General appearance: alert, cooperative and mild distress Resp: clear to auscultation bilaterally Cardio: regular rate and rhythm, S1, S2 normal, no murmur, click, rub or gallop GI: soft, non-tender; bowel sounds normal; no masses,  no organomegaly Extremities: extremities normal, atraumatic, no cyanosis or edema  Lab Results:  Results for orders placed or performed during the hospital encounter of 12/12/18 (from the past 48 hour(s))  Basic metabolic panel     Status: Abnormal   Collection Time: 12/18/18  6:26 AM  Result Value Ref Range   Sodium 144 135 - 145 mmol/L   Potassium 3.9 3.5 - 5.1 mmol/L   Chloride 110 98 - 111 mmol/L   CO2 27 22 - 32 mmol/L   Glucose, Bld 96 70 - 99 mg/dL   BUN 33 (H) 8 - 23 mg/dL   Creatinine, Ser 0.89 0.61 - 1.24 mg/dL   Calcium 8.2 (L) 8.9 - 10.3 mg/dL   GFR calc non Af Amer >60 >60 mL/min   GFR calc Af Amer >60 >60 mL/min   Anion gap 7 5 - 15    Comment: Performed at [facility], [address], [place], [place] [address]  CBC with Differential/Platelet     Status: Abnormal   Collection Time: 12/18/18  6:26 AM  Result Value Ref Range   WBC 2.4 (L) 4.0 - 10.5 K/uL   RBC 2.75 (L) 4.22 - 5.81 MIL/uL   Hemoglobin 8.9 (L) 13.0 - 17.0 g/dL   HCT 29.3 (L) 39.0 - 52.0 %   MCV 106.5 (H) 80.0 - 100.0 fL   MCH 32.4  26.0 - 34.0 pg   MCHC 30.4 30.0 - 36.0 g/dL   RDW 19.6 (H) 11.5 - 15.5 %   Platelets 68 (L) 150 - 400 K/uL    Comment: REPEATED TO VERIFY SPECIMEN CHECKED FOR CLOTS Immature Platelet Fraction may be clinically indicated, consider ordering this additional test [id number] CONSISTENT WITH PREVIOUS RESULT    nRBC 0.0 0.0 - 0.2 %   Neutrophils Relative % 82 %   Neutro Abs 1.9 1.7 - 7.7 K/uL   Lymphocytes Relative 7 %   Lymphs Abs 0.2 (L) 0.7 - 4.0 K/uL   Monocytes Relative 11 %   Monocytes Absolute 0.3 0.1 - 1.0 K/uL   Eosinophils Relative 0 %   Eosinophils Absolute 0.0 0.0 - 0.5 K/uL   Basophils Relative 0 %   Basophils Absolute 0.0 0.0 - 0.1 K/uL   Immature Granulocytes 0 %   Abs Immature Granulocytes 0.01 0.00 - 0.07 K/uL    Comment: Performed at [facility], [address], [place], [place] [address]  Magnesium     Status: None   Collection Time: 12/18/18  6:26 AM  Result Value Ref Range   Magnesium 2.0 1.7 - 2.4 mg/dL  Comment: Performed at [facility], [address], [place], [place] [address]  Basic metabolic panel     Status: Abnormal   Collection Time: 12/19/18  5:00 AM  Result Value Ref Range   Sodium 145 135 - 145 mmol/L   Potassium 4.0 3.5 - 5.1 mmol/L   Chloride 111 98 - 111 mmol/L   CO2 27 22 - 32 mmol/L   Glucose, Bld 98 70 - 99 mg/dL   BUN 28 (H) 8 - 23 mg/dL   Creatinine, Ser 0.93 0.61 - 1.24 mg/dL   Calcium 8.1 (L) 8.9 - 10.3 mg/dL   GFR calc non Af Amer >60 >60 mL/min   GFR calc Af Amer >60 >60 mL/min   Anion gap 7 5 - 15    Comment: Performed at [facility], [address], [place], [place] [address]  CBC with Differential/Platelet     Status: Abnormal   Collection Time: 12/19/18  5:00 AM  Result Value Ref Range   WBC 2.2 (L) 4.0 - 10.5 K/uL   RBC 2.77 (L) 4.22 - 5.81 MIL/uL   Hemoglobin 8.9 (L) 13.0 - 17.0 g/dL   HCT 29.7 (L) 39.0 - 52.0 %   MCV 107.2 (H) 80.0 - 100.0 fL   MCH 32.1 26.0 - 34.0 pg   MCHC 30.0 30.0 - 36.0 g/dL   RDW 19.5 (H) 11.5  - 15.5 %   Platelets 66 (L) 150 - 400 K/uL    Comment: Immature Platelet Fraction may be clinically indicated, consider ordering this additional test [id number]    nRBC 0.0 0.0 - 0.2 %   Neutrophils Relative % 79 %   Neutro Abs 1.8 1.7 - 7.7 K/uL   Lymphocytes Relative 8 %   Lymphs Abs 0.2 (L) 0.7 - 4.0 K/uL   Monocytes Relative 10 %   Monocytes Absolute 0.2 0.1 - 1.0 K/uL   Eosinophils Relative 1 %   Eosinophils Absolute 0.0 0.0 - 0.5 K/uL   Basophils Relative 1 %   Basophils Absolute 0.0 0.0 - 0.1 K/uL   Immature Granulocytes 1 %   Abs Immature Granulocytes 0.01 0.00 - 0.07 K/uL    Comment: Performed at [facility], [address] [place], [place] [address]    ABGS No results for input(s): PHART, PO2ART, TCO2, HCO3 in the last 72 hours.  Invalid input(s): PCO2 CULTURES Recent Results (from the past 240 hour(s))  SARS CORONAVIRUS 2 (TAT 6-24 HRS) Nasopharyngeal Nasopharyngeal Swab     Status: None   Collection Time: 12/12/18 11:43 AM   Specimen: Nasopharyngeal Swab  Result Value Ref Range Status   SARS Coronavirus 2 NEGATIVE NEGATIVE Final    Comment: (NOTE) SARS-CoV-2 target nucleic acids are NOT DETECTED. The SARS-CoV-2 RNA is generally detectable in upper and lower respiratory specimens during the acute phase of infection. Negative results do not preclude SARS-CoV-2 infection, do not rule out co-infections with other pathogens, and should not be used as the sole basis for treatment or other patient management decisions. Negative results must be combined with clinical observations, patient history, and epidemiological information. The expected result is Negative. Fact Sheet for Patients: SugarRoll.be Fact Sheet for Healthcare Providers: https://www.woods-mathews.com/ This test is not yet approved or cleared by the [place] FDA and  has been authorized for detection and/or diagnosis of SARS-CoV-2 by FDA under an Emergency  Use Authorization (EUA). This EUA will remain  in effect (meaning this test can be used) for the duration of the COVID-19 declaration under Section 56 4(b)(1)  of the Act, 21 U.S.C. section 360bbb-3(b)(1), unless the authorization is terminated or revoked sooner. Performed at [facility], [place] [address], [place], [place] [address]    Studies/Results: No results found.  Medications:  Prior to Admission:  Medications Prior to Admission  Medication Sig Dispense Refill Last Dose  . acetaminophen (TYLENOL) 500 MG tablet Take 500 mg by mouth every 6 (six) hours as needed for pain.     [name] aspirin EC 81 MG tablet Take 81 mg by mouth daily.     [name] CARBOPLATIN IV Inject into the vein once a week.     . cholecalciferol (VITAMIN D) 1000 units tablet Take 1,000 Units by mouth 2 (two) times daily.     . clopidogrel (PLAVIX) 75 MG tablet Take 75 mg by mouth daily.     . dorzolamide-timolol (COSOPT) 22.3-6.8 MG/ML ophthalmic solution Place 1 drop into the left eye 2 (two) times daily.     . ferrous sulfate 325 (65 FE) MG tablet Take 325 mg by mouth daily.      . hydrochlorothiazide (HYDRODIURIL) 25 MG tablet TAKE ONE (1) TABLET BY MOUTH EVERY DAY (Patient taking differently: Take 25 mg by mouth daily. ) 90 tablet 3   . lidocaine-prilocaine (EMLA) cream Apply a dime-sized amount to port a cath site and cover with plastic wrap one hour prior to chemotherapy appointments 30 g 0   . losartan (COZAAR) 25 MG tablet TAKE ONE TABLET BY MOUTY EVERY DAY (Patient taking differently: Take 25 mg by mouth daily. ) 90 tablet 1   . lovastatin (MEVACOR) 10 MG tablet TAKE ONE (1) TABLET BY MOUTH EVERY DAY 90 tablet 3   . metoprolol succinate (TOPROL XL) 25 MG 24 hr tablet Take 1 tablet (25 mg total) by mouth daily. 90 tablet 3   . ondansetron (ZOFRAN ODT) 8 MG disintegrating tablet Take 1 tablet (8 mg total) by mouth 2 (two) times daily as needed for nausea or vomiting. (Patient not taking: Reported on 11/28/2018) 60  tablet 3   . PACLITAXEL IV Inject into the vein once a week.     . pantoprazole (PROTONIX) 40 MG tablet Take 40 mg by mouth daily.     . prochlorperazine (COMPAZINE) 10 MG tablet Take 1 tablet (10 mg total) by mouth every 6 (six) hours as needed for nausea or vomiting. (Patient not taking: Reported on 11/28/2018) 30 tablet 1   . Propylene Glycol (SYSTANE BALANCE OP) Place 1 drop into both eyes 2 (two) times daily as needed (dry eyes).      [name] scopolamine (TRANSDERM-SCOP) 1 MG/3DAYS Place 1 patch (1.5 mg total) onto the skin every 3 (three) days. (Patient not taking: Reported on 11/28/2018) 10 patch 12   . tamsulosin (FLOMAX) 0.4 MG CAPS capsule Take 0.4 mg by mouth daily.     [name] triamcinolone cream (KENALOG) 0.1 % Apply 1 application topically daily as needed (itching).      . vitamin B-12 (CYANOCOBALAMIN) 1000 MCG tablet Take 1,000 mcg by mouth daily.      Scheduled: . sodium chloride   Intravenous Once  . aspirin EC  81 mg Oral Daily  . Chlorhexidine Gluconate Cloth  6 each Topical Daily  . cholecalciferol  1,000 Units Oral BID  . dorzolamide-timolol  1 drop Left Eye BID  . feeding supplement  1 Container Oral TID BM  . magic mouthwash w/lidocaine  10 mL Oral TID  . metoprolol succinate  25 mg Oral Daily  . oxyCODONE-acetaminophen  1 tablet Oral TID  . pantoprazole  40 mg Oral QAC breakfast  . sucralfate  1 g Oral TID WC & HS  . tamsulosin  0.4 mg Oral Daily  . vitamin B-12  1,000 mcg Oral Daily   Continuous: . 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  He is slowly improving.  He has been dehydrated and receiving IV fluids.  His potassium level has been low and has not been replaced and improved  He had acute kidney injury from dehydration and that is improved  He has squamous cell carcinoma of the lung and is received radiation and  chemotherapy  He has severe weakness and it has been recommended that he go to rehab for improvement Active Problems:   Squamous cell carcinoma of lung (HCC)   Weakness   Odynophagia   AKI (acute kidney injury) ([place])   Radiation-induced esophagitis   Dehydration   Hypokalemia    Plan: Continue treatments.    LOS: 6 days   Alonza Bogus 12/19/2018, 8:22 AM

## 2018-12-20 DIAGNOSIS — I4891 Unspecified atrial fibrillation: Secondary | ICD-10-CM

## 2018-12-20 LAB — CBC WITH DIFFERENTIAL/PLATELET
Abs Immature Granulocytes: 0.01 10*3/uL (ref 0.00–0.07)
Basophils Absolute: 0 10*3/uL (ref 0.0–0.1)
Basophils Relative: 0 %
Eosinophils Absolute: 0 10*3/uL (ref 0.0–0.5)
Eosinophils Relative: 0 %
HCT: 28.7 % — ABNORMAL LOW (ref 39.0–52.0)
Hemoglobin: 8.8 g/dL — ABNORMAL LOW (ref 13.0–17.0)
Immature Granulocytes: 0 %
Lymphocytes Relative: 9 %
Lymphs Abs: 0.2 10*3/uL — ABNORMAL LOW (ref 0.7–4.0)
MCH: 32.8 pg (ref 26.0–34.0)
MCHC: 30.7 g/dL (ref 30.0–36.0)
MCV: 107.1 fL — ABNORMAL HIGH (ref 80.0–100.0)
Monocytes Absolute: 0.2 10*3/uL (ref 0.1–1.0)
Monocytes Relative: 8 %
Neutro Abs: 2 10*3/uL (ref 1.7–7.7)
Neutrophils Relative %: 83 %
Platelets: 66 10*3/uL — ABNORMAL LOW (ref 150–400)
RBC: 2.68 MIL/uL — ABNORMAL LOW (ref 4.22–5.81)
RDW: 20.3 % — ABNORMAL HIGH (ref 11.5–15.5)
WBC: 2.4 10*3/uL — ABNORMAL LOW (ref 4.0–10.5)
nRBC: 0 % (ref 0.0–0.2)

## 2018-12-20 LAB — BASIC METABOLIC PANEL
Anion gap: 8 (ref 5–15)
BUN: 24 mg/dL — ABNORMAL HIGH (ref 8–23)
CO2: 24 mmol/L (ref 22–32)
Calcium: 8 mg/dL — ABNORMAL LOW (ref 8.9–10.3)
Chloride: 112 mmol/L — ABNORMAL HIGH (ref 98–111)
Creatinine, Ser: 0.81 mg/dL (ref 0.61–1.24)
GFR calc Af Amer: >60 mL/min (ref >60)
GFR calc non Af Amer: >60 mL/min (ref >60)
Glucose, Bld: 97 mg/dL (ref 70–99)
Potassium: 3.5 mmol/L (ref 3.5–5.1)
Sodium: 144 mmol/L (ref 135–145)

## 2018-12-20 MED ORDER — ACETAMINOPHEN 500 MG PO TABS
500.0000 mg | ORAL_TABLET | Freq: Four times a day (QID) | ORAL | Status: DC | PRN
  Administered 2018-12-20 – 2018-12-25 (×5): 500 mg via ORAL
  Filled 2018-12-20 (×5): qty 1

## 2018-12-20 MED ORDER — POLYETHYLENE GLYCOL 3350 17 G PO PACK
17.0000 g | PACK | Freq: Once | ORAL | Status: AC
  Administered 2018-12-20: 17:00:00 17 g via ORAL
  Filled 2018-12-20: qty 1

## 2018-12-20 MED ORDER — POLYETHYLENE GLYCOL 3350 17 G PO PACK: 17.0000 g | PACK | Freq: Every day | ORAL | Status: DC | PRN

## 2018-12-20 NOTE — Progress Notes (Signed)
    Subjective: Patient is feeling much better today. States his has minimal burning in his esophagus. No abdominal pain. Water is now going down without any burning at all. Really enjoys the chicken broth and chocolate pudding. No nausea or vomiting. Having hard BMs today. Feels his strength and energy are much improved. He is smiling and laughing during my visit with him. States he feels he is ready to go home but has agreed to go to rehab at discharge.   Objective: Vital signs in last 24 hours: Temp:  [97.3 F (36.3 C)-98.3 F (36.8 C)] 97.3 F (36.3 C) (10/20 0539) Pulse Rate:  [66-70] 70 (10/20 0539) Resp:  [16-18] 18 (10/20 0539) BP: (138-161)/(71-77) 160/77 (10/20 0539) SpO2:  [89 %-100 %] 89 % (10/20 0539) Last BM Date: 12/15/18 General:   Alert and oriented, pleasant.  Head:  Normocephalic and atraumatic. Eyes:  No icterus, sclera clear. Conjuctiva pink.  Abdomen:  Bowel sounds present, soft, non-tender, non-distended. No rebound or guarding. No masses appreciated  Msk:  Symmetrical without gross deformities. Normal posture. Extremities: Minimal 1+ lower extremity pitting edema. Neurologic:  Alert and  oriented x4 Skin:  Warm and dry, intact without significant lesions.  Psych:  Normal mood and affect.  Intake/Output from previous day: 10/19 0701 - 10/20 0700 In: 1563.2 [P.O.:840; I.V.:723.2] Out: -  Intake/Output this shift: No intake/output data recorded.  Lab Results: Recent Labs    12/18/18 0626 12/19/18 0500 12/20/18 0619  WBC 2.4* 2.2* 2.4*  HGB 8.9* 8.9* 8.8*  HCT 29.3* 29.7* 28.7*  PLT 68* 66* 66*   BMET Recent Labs    12/18/18 0626 12/19/18 0500 12/20/18 0619  NA 144 145 144  K 3.9 4.0 3.5  CL 110 111 112*  CO2 27 27 24   GLUCOSE 96 98 97  BUN 33* 28* 24*  CREATININE 0.89 0.93 0.81  CALCIUM 8.2* 8.1* 8.0*   Assessment: 85 year old male with squamous cell carcinoma of left lung, undergoing chemo and radiation, now admitted with  radiation-induced esophagitis.  He did undergo EGD during this admission on 12/15/2018 revealing severe esophagitis negative for HSV 1 or 2 or fungal organisms. He has been slowly improving. Today patient feels much better with minimal burning in his esophagus at times. Abdominal pain, nausea, and vomiting has resolved. Tolerating clear liquids, applesauce, and pudding well.   Reports his stools are hard. Passing only a small hard ball of stool today. Will add miraLAX 17 g once today and then daily as needed for constipation.   Plan: Continue full liquid diet.  Continue Magic mouthwash with lidocaine 3 times daily. Continue Protonix daily. Continue Carafate 3 times daily with meals and at bedtime. Continue boost supplement between meals. Add MiraLAX 17 g.  Once today then as needed for constipation.    LOS: 7 days    12/20/2018, 2:18 PM   Aliene Altes, [facility]

## 2018-12-20 NOTE — Progress Notes (Signed)
Subjective: He says he feels substantially better.  He has no new complaints.  He has been able to tolerate food a little bit better.  Cardiology help noted and appreciated  Objective: Vital signs in last 24 hours: Temp:  [97.3 F (36.3 C)-98.3 F (36.8 C)] 97.3 F (36.3 C) (10/20 0539) Pulse Rate:  [66-70] 70 (10/20 0539) Resp:  [16-18] 18 (10/20 0539) BP: (138-161)/(71-77) 160/77 (10/20 0539) SpO2:  [89 %-100 %] 89 % (10/20 0539) Weight change:  Last BM Date: 12/15/18  Intake/Output from previous day: 10/19 0701 - 10/20 0700 In: 1563.2 [P.O.:840; I.V.:723.2] Out: -   PHYSICAL EXAM General appearance: alert, cooperative and no distress Resp: clear to auscultation bilaterally Cardio: regular rate and rhythm, S1, S2 normal, no murmur, click, rub or gallop GI: soft, non-tender; bowel sounds normal; no masses,  no organomegaly Extremities: extremities normal, atraumatic, no cyanosis or edema  Lab Results:  Results for orders placed or performed during the hospital encounter of 12/12/18 (from the past 48 hour(s))  Basic metabolic panel     Status: Abnormal   Collection Time: 12/19/18  5:00 AM  Result Value Ref Range   Sodium 145 135 - 145 mmol/L   Potassium 4.0 3.5 - 5.1 mmol/L   Chloride 111 98 - 111 mmol/L   CO2 27 22 - 32 mmol/L   Glucose, Bld 98 70 - 99 mg/dL   BUN 28 (H) 8 - 23 mg/dL   Creatinine, Ser 0.93 0.61 - 1.24 mg/dL   Calcium 8.1 (L) 8.9 - 10.3 mg/dL   GFR calc non Af Amer >60 >60 mL/min   GFR calc Af Amer >60 >60 mL/min   Anion gap 7 5 - 15    Comment: Performed at [facility], [address] [place], [place] [address]  CBC with Differential/Platelet     Status: Abnormal   Collection Time: 12/19/18  5:00 AM  Result Value Ref Range   WBC 2.2 (L) 4.0 - 10.5 K/uL   RBC 2.77 (L) 4.22 - 5.81 MIL/uL   Hemoglobin 8.9 (L) 13.0 - 17.0 g/dL   HCT 29.7 (L) 39.0 - 52.0 %   MCV 107.2 (H) 80.0 - 100.0 fL   MCH 32.1 26.0 - 34.0 pg   MCHC 30.0 30.0 - 36.0 g/dL   RDW  19.5 (H) 11.5 - 15.5 %   Platelets 66 (L) 150 - 400 K/uL    Comment: Immature Platelet Fraction may be clinically indicated, consider ordering this additional test [id number]    nRBC 0.0 0.0 - 0.2 %   Neutrophils Relative % 79 %   Neutro Abs 1.8 1.7 - 7.7 K/uL   Lymphocytes Relative 8 %   Lymphs Abs 0.2 (L) 0.7 - 4.0 K/uL   Monocytes Relative 10 %   Monocytes Absolute 0.2 0.1 - 1.0 K/uL   Eosinophils Relative 1 %   Eosinophils Absolute 0.0 0.0 - 0.5 K/uL   Basophils Relative 1 %   Basophils Absolute 0.0 0.0 - 0.1 K/uL   Immature Granulocytes 1 %   Abs Immature Granulocytes 0.01 0.00 - 0.07 K/uL    Comment: Performed at [facility], [address] [place], [place] [address]  Basic metabolic panel     Status: Abnormal   Collection Time: 12/20/18  6:19 AM  Result Value Ref Range   Sodium 144 135 - 145 mmol/L   Potassium 3.5 3.5 - 5.1 mmol/L   Chloride 112 (H) 98 - 111 mmol/L   CO2 24 22 - 32 mmol/L  Glucose, Bld 97 70 - 99 mg/dL   BUN 24 (H) 8 - 23 mg/dL   Creatinine, Ser 0.81 0.61 - 1.24 mg/dL   Calcium 8.0 (L) 8.9 - 10.3 mg/dL   GFR calc non Af Amer >60 >60 mL/min   GFR calc Af Amer >60 >60 mL/min   Anion gap 8 5 - 15    Comment: Performed at [facility], [address] [place], [place] [address]  CBC with Differential/Platelet     Status: Abnormal   Collection Time: 12/20/18  6:19 AM  Result Value Ref Range   WBC 2.4 (L) 4.0 - 10.5 K/uL   RBC 2.68 (L) 4.22 - 5.81 MIL/uL   Hemoglobin 8.8 (L) 13.0 - 17.0 g/dL   HCT 28.7 (L) 39.0 - 52.0 %   MCV 107.1 (H) 80.0 - 100.0 fL   MCH 32.8 26.0 - 34.0 pg   MCHC 30.7 30.0 - 36.0 g/dL   RDW 20.3 (H) 11.5 - 15.5 %   Platelets 66 (L) 150 - 400 K/uL    Comment: Immature Platelet Fraction may be clinically indicated, consider ordering this additional test [id number]    nRBC 0.0 0.0 - 0.2 %   Neutrophils Relative % 83 %   Neutro Abs 2.0 1.7 - 7.7 K/uL   Lymphocytes Relative 9 %   Lymphs Abs 0.2 (L) 0.7 - 4.0 K/uL   Monocytes  Relative 8 %   Monocytes Absolute 0.2 0.1 - 1.0 K/uL   Eosinophils Relative 0 %   Eosinophils Absolute 0.0 0.0 - 0.5 K/uL   Basophils Relative 0 %   Basophils Absolute 0.0 0.0 - 0.1 K/uL   Immature Granulocytes 0 %   Abs Immature Granulocytes 0.01 0.00 - 0.07 K/uL    Comment: Performed at [facility], [address] [place], [place] [address]    ABGS No results for input(s): PHART, PO2ART, TCO2, HCO3 in the last 72 hours.  Invalid input(s): PCO2 CULTURES Recent Results (from the past 240 hour(s))  SARS CORONAVIRUS 2 (TAT 6-24 HRS) Nasopharyngeal Nasopharyngeal Swab     Status: None   Collection Time: 12/12/18 11:43 AM   Specimen: Nasopharyngeal Swab  Result Value Ref Range Status   SARS Coronavirus 2 NEGATIVE NEGATIVE Final    Comment: (NOTE) SARS-CoV-2 target nucleic acids are NOT DETECTED. The SARS-CoV-2 RNA is generally detectable in upper and lower respiratory specimens during the acute phase of infection. Negative results do not preclude SARS-CoV-2 infection, do not rule out co-infections with other pathogens, and should not be used as the sole basis for treatment or other patient management decisions. Negative results must be combined with clinical observations, patient history, and epidemiological information. The expected result is Negative. Fact Sheet for Patients: SugarRoll.be Fact Sheet for Healthcare Providers: https://www.woods-mathews.com/ This test is not yet approved or cleared by the [place] FDA and  has been authorized for detection and/or diagnosis of SARS-CoV-2 by FDA under an Emergency Use Authorization (EUA). This EUA will remain  in effect (meaning this test can be used) for the duration of the COVID-19 declaration under Section 56 4(b)(1) of the Act, 21 U.S.C. section 360bbb-3(b)(1), unless the authorization is terminated or revoked sooner. Performed at [facility], [place] [address],  [place], [place] [address]    Studies/Results: No results found.  Medications:  Prior to Admission:  Medications Prior to Admission  Medication Sig Dispense Refill Last Dose  . acetaminophen (TYLENOL) 500 MG tablet Take 500 mg by mouth every 6 (six) hours  as needed for pain.     [name] aspirin EC 81 MG tablet Take 81 mg by mouth daily.     [name] CARBOPLATIN IV Inject into the vein once a week.     . cholecalciferol (VITAMIN D) 1000 units tablet Take 1,000 Units by mouth 2 (two) times daily.     . clopidogrel (PLAVIX) 75 MG tablet Take 75 mg by mouth daily.     . dorzolamide-timolol (COSOPT) 22.3-6.8 MG/ML ophthalmic solution Place 1 drop into the left eye 2 (two) times daily.     . ferrous sulfate 325 (65 FE) MG tablet Take 325 mg by mouth daily.      . hydrochlorothiazide (HYDRODIURIL) 25 MG tablet TAKE ONE (1) TABLET BY MOUTH EVERY DAY (Patient taking differently: Take 25 mg by mouth daily. ) 90 tablet 3   . lidocaine-prilocaine (EMLA) cream Apply a dime-sized amount to port a cath site and cover with plastic wrap one hour prior to chemotherapy appointments 30 g 0   . losartan (COZAAR) 25 MG tablet TAKE ONE TABLET BY MOUTY EVERY DAY (Patient taking differently: Take 25 mg by mouth daily. ) 90 tablet 1   . lovastatin (MEVACOR) 10 MG tablet TAKE ONE (1) TABLET BY MOUTH EVERY DAY 90 tablet 3   . metoprolol succinate (TOPROL XL) 25 MG 24 hr tablet Take 1 tablet (25 mg total) by mouth daily. 90 tablet 3   . ondansetron (ZOFRAN ODT) 8 MG disintegrating tablet Take 1 tablet (8 mg total) by mouth 2 (two) times daily as needed for nausea or vomiting. (Patient not taking: Reported on 11/28/2018) 60 tablet 3   . PACLITAXEL IV Inject into the vein once a week.     . pantoprazole (PROTONIX) 40 MG tablet Take 40 mg by mouth daily.     . prochlorperazine (COMPAZINE) 10 MG tablet Take 1 tablet (10 mg total) by mouth every 6 (six) hours as needed for nausea or vomiting. (Patient not taking: Reported on 11/28/2018) 30  tablet 1   . Propylene Glycol (SYSTANE BALANCE OP) Place 1 drop into both eyes 2 (two) times daily as needed (dry eyes).      [name] scopolamine (TRANSDERM-SCOP) 1 MG/3DAYS Place 1 patch (1.5 mg total) onto the skin every 3 (three) days. (Patient not taking: Reported on 11/28/2018) 10 patch 12   . tamsulosin (FLOMAX) 0.4 MG CAPS capsule Take 0.4 mg by mouth daily.     [name] triamcinolone cream (KENALOG) 0.1 % Apply 1 application topically daily as needed (itching).      . vitamin B-12 (CYANOCOBALAMIN) 1000 MCG tablet Take 1,000 mcg by mouth daily.      Scheduled: . sodium chloride   Intravenous Once  . aspirin EC  81 mg Oral Daily  . Chlorhexidine Gluconate Cloth  6 each Topical Daily  . cholecalciferol  1,000 Units Oral BID  . dorzolamide-timolol  1 drop Left Eye BID  . feeding supplement  1 Container Oral TID BM  . magic mouthwash w/lidocaine  10 mL Oral TID  . metoprolol succinate  50 mg Oral Daily  . pantoprazole  40 mg Oral QAC breakfast  . sucralfate  1 g Oral TID WC & HS  . tamsulosin  0.4 mg Oral Daily  . vitamin B-12  1,000 mcg Oral Daily   Continuous: . sodium chloride 75 mL/hr at 12/19/18 1649   ELF:[id number], lidocaine, ondansetron **OR** ondansetron (ZOFRAN) IV, oxyCODONE-acetaminophen  Assesment: He was admitted with odynophagia related to radiation-induced esophagitis.  He was  dehydrated.  He was hypokalemic and that is been replaced.  His odynophagia has improved  He had acute kidney injury that has resolved with rehydration  He has squamous cell carcinoma of the lung receiving chemo and radiation  He has had bursts of atrial fib with aberrancy not V. tach. Active Problems:   Squamous cell carcinoma of lung (HCC)   Weakness   Odynophagia   AKI (acute kidney injury) ([place])   Radiation-induced esophagitis   Dehydration   Hypokalemia    Plan: Continue treatments.  Trying to arrange skilled care facility placement    LOS: 7 days   Alonza Bogus 12/20/2018,  8:54 AM

## 2018-12-20 NOTE — Progress Notes (Signed)
Telemetry reviewed yesterday for wide complex irregular rhythm consistent with afib with aberancy. No evidence of VT. Toprol was increased from 25mg  daily to 50mg  daily. BP's tolerated increase, telemetry today shows no significant recurrent episodes since additional toprol dose given yesterday. Continue current medications, room to titrate toprol further if needed. May call us back if significant recurrent arrhythmias or any other questions this admission    Carlyle Dolly MD

## 2018-12-20 NOTE — TOC Progression Note (Signed)
Transition of Care [facility] - Progression Note    Patient Details  Name: Nicholas Sanchez MRN: [id number] Date of Birth: December 08, 1933  Transition [facility]) CM/SW Contact  Shade Flood, LCSW Phone Number: 12/20/2018, 3:53 PM  Clinical Narrative:     TOC following. Continuing to work on SNF referral. Attempting to find out if pt's chemo/radiation will be on hold while he completes his short term rehab.  Discussed with Dr. Luan Pulling. Attempting to reach out to Dr. Galvin Proffer. TOC did start pt's [facility] authorization today. Will follow up in AM.  Expected Discharge Plan: [place] Barriers to Discharge: Continued Medical Work up  Expected Discharge Plan and Services Expected Discharge Plan: [place]   Discharge Planning Services: CM Consult   Living arrangements for the past 2 months: Single Family Home                                       Social Determinants of Health (SDOH) Interventions    Readmission Risk Interventions Readmission Risk Prevention Plan 12/13/2018  Transportation Screening Complete  Social Work Consult for [place] Planning/Counseling Complete  Medication Review Press photographer) Complete  Some recent data might be hidden

## 2018-12-21 LAB — SARS CORONAVIRUS 2 (TAT 6-24 HRS): SARS Coronavirus 2: NEGATIVE

## 2018-12-21 MED ORDER — MAGIC MOUTHWASH W/LIDOCAINE: 10.0000 mL | Freq: Three times a day (TID) | ORAL | 0 refills | Status: DC

## 2018-12-21 MED ORDER — METOPROLOL SUCCINATE ER 50 MG PO TB24: 50.0000 mg | ORAL_TABLET | Freq: Every day | ORAL | Status: DC

## 2018-12-21 MED ORDER — POTASSIUM CHLORIDE CRYS ER 20 MEQ PO TBCR
40.0000 meq | EXTENDED_RELEASE_TABLET | Freq: Once | ORAL | Status: AC
  Administered 2018-12-21: 10:00:00 40 meq via ORAL
  Filled 2018-12-21: qty 2

## 2018-12-21 MED ORDER — POLYETHYLENE GLYCOL 3350 17 G PO PACK: 17.0000 g | PACK | Freq: Every day | ORAL | 0 refills | Status: DC | PRN

## 2018-12-21 MED ORDER — LIDOCAINE VISCOUS HCL 2 % MT SOLN: 15.0000 mL | OROMUCOSAL | 0 refills | Status: DC | PRN

## 2018-12-21 MED ORDER — PANTOPRAZOLE SODIUM 40 MG PO TBEC: 40.0000 mg | DELAYED_RELEASE_TABLET | Freq: Every day | ORAL | Status: DC

## 2018-12-21 MED ORDER — OXYCODONE-ACETAMINOPHEN 5-325 MG PO TABS: 1.0000 | ORAL_TABLET | Freq: Three times a day (TID) | ORAL | 0 refills | Status: DC | PRN

## 2018-12-21 MED ORDER — SUCRALFATE 1 GM/10ML PO SUSP: 1.0000 g | Freq: Three times a day (TID) | ORAL | 0 refills | Status: DC

## 2018-12-21 NOTE — Progress Notes (Signed)
Subjective: He says he feels okay.  He is able to eat.  No new complaints.  Objective: Vital signs in last 24 hours: Temp:  [97.7 F (36.5 C)-98.5 F (36.9 C)] 97.7 F (36.5 C) (10/21 0546) Pulse Rate:  [69-71] 69 (10/21 0546) Resp:  [18] 18 (10/21 0546) BP: (143-144)/(72-78) 143/72 (10/21 0546) SpO2:  [94 %-97 %] 97 % (10/21 0546) Weight change:  Last BM Date: 12/15/18  Intake/Output from previous day: 10/20 0701 - 10/21 0700 In: 240 [P.O.:240] Out: -   PHYSICAL EXAM General appearance: alert, cooperative and no distress Resp: rhonchi bilaterally Cardio: regular rate and rhythm, S1, S2 normal, no murmur, click, rub or gallop GI: soft, non-tender; bowel sounds normal; no masses,  no organomegaly Extremities: extremities normal, atraumatic, no cyanosis or edema  Lab Results:  Results for orders placed or performed during the hospital encounter of 12/12/18 (from the past 48 hour(s))  Basic metabolic panel     Status: Abnormal   Collection Time: 12/20/18  6:19 AM  Result Value Ref Range   Sodium 144 135 - 145 mmol/L   Potassium 3.5 3.5 - 5.1 mmol/L   Chloride 112 (H) 98 - 111 mmol/L   CO2 24 22 - 32 mmol/L   Glucose, Bld 97 70 - 99 mg/dL   BUN 24 (H) 8 - 23 mg/dL   Creatinine, Ser 0.81 0.61 - 1.24 mg/dL   Calcium 8.0 (L) 8.9 - 10.3 mg/dL   GFR calc non Af Amer >60 >60 mL/min   GFR calc Af Amer >60 >60 mL/min   Anion gap 8 5 - 15    Comment: Performed at [facility], [address] [place], [place] [address]  CBC with Differential/Platelet     Status: Abnormal   Collection Time: 12/20/18  6:19 AM  Result Value Ref Range   WBC 2.4 (L) 4.0 - 10.5 K/uL   RBC 2.68 (L) 4.22 - 5.81 MIL/uL   Hemoglobin 8.8 (L) 13.0 - 17.0 g/dL   HCT 28.7 (L) 39.0 - 52.0 %   MCV 107.1 (H) 80.0 - 100.0 fL   MCH 32.8 26.0 - 34.0 pg   MCHC 30.7 30.0 - 36.0 g/dL   RDW 20.3 (H) 11.5 - 15.5 %   Platelets 66 (L) 150 - 400 K/uL    Comment: Immature Platelet Fraction may be clinically  indicated, consider ordering this additional test [id number]    nRBC 0.0 0.0 - 0.2 %   Neutrophils Relative % 83 %   Neutro Abs 2.0 1.7 - 7.7 K/uL   Lymphocytes Relative 9 %   Lymphs Abs 0.2 (L) 0.7 - 4.0 K/uL   Monocytes Relative 8 %   Monocytes Absolute 0.2 0.1 - 1.0 K/uL   Eosinophils Relative 0 %   Eosinophils Absolute 0.0 0.0 - 0.5 K/uL   Basophils Relative 0 %   Basophils Absolute 0.0 0.0 - 0.1 K/uL   Immature Granulocytes 0 %   Abs Immature Granulocytes 0.01 0.00 - 0.07 K/uL    Comment: Performed at [facility], [address], [place], [place] [address]    ABGS No results for input(s): PHART, PO2ART, TCO2, HCO3 in the last 72 hours.  Invalid input(s): PCO2 CULTURES Recent Results (from the past 240 hour(s))  SARS CORONAVIRUS 2 (TAT 6-24 HRS) Nasopharyngeal Nasopharyngeal Swab     Status: None   Collection Time: 12/12/18 11:43 AM   Specimen: Nasopharyngeal Swab  Result Value Ref Range Status   SARS Coronavirus 2 NEGATIVE NEGATIVE Final  Comment: (NOTE) SARS-CoV-2 target nucleic acids are NOT DETECTED. The SARS-CoV-2 RNA is generally detectable in upper and lower respiratory specimens during the acute phase of infection. Negative results do not preclude SARS-CoV-2 infection, do not rule out co-infections with other pathogens, and should not be used as the sole basis for treatment or other patient management decisions. Negative results must be combined with clinical observations, patient history, and epidemiological information. The expected result is Negative. Fact Sheet for Patients: SugarRoll.be Fact Sheet for Healthcare Providers: https://www.woods-mathews.com/ This test is not yet approved or cleared by the [place] FDA and  has been authorized for detection and/or diagnosis of SARS-CoV-2 by FDA under an Emergency Use Authorization (EUA). This EUA will remain  in effect (meaning this test can be used) for the  duration of the COVID-19 declaration under Section 56 4(b)(1) of the Act, 21 U.S.C. section 360bbb-3(b)(1), unless the authorization is terminated or revoked sooner. Performed at [facility], [place] [address], [place], [place] [address]    Studies/Results: No results found.  Medications:  Prior to Admission:  Medications Prior to Admission  Medication Sig Dispense Refill Last Dose  . acetaminophen (TYLENOL) 500 MG tablet Take 500 mg by mouth every 6 (six) hours as needed for pain.     [name] aspirin EC 81 MG tablet Take 81 mg by mouth daily.     [name] CARBOPLATIN IV Inject into the vein once a week.     . cholecalciferol (VITAMIN D) 1000 units tablet Take 1,000 Units by mouth 2 (two) times daily.     . clopidogrel (PLAVIX) 75 MG tablet Take 75 mg by mouth daily.     . dorzolamide-timolol (COSOPT) 22.3-6.8 MG/ML ophthalmic solution Place 1 drop into the left eye 2 (two) times daily.     . ferrous sulfate 325 (65 FE) MG tablet Take 325 mg by mouth daily.      . hydrochlorothiazide (HYDRODIURIL) 25 MG tablet TAKE ONE (1) TABLET BY MOUTH EVERY DAY (Patient taking differently: Take 25 mg by mouth daily. ) 90 tablet 3   . lidocaine-prilocaine (EMLA) cream Apply a dime-sized amount to port a cath site and cover with plastic wrap one hour prior to chemotherapy appointments 30 g 0   . losartan (COZAAR) 25 MG tablet TAKE ONE TABLET BY MOUTY EVERY DAY (Patient taking differently: Take 25 mg by mouth daily. ) 90 tablet 1   . lovastatin (MEVACOR) 10 MG tablet TAKE ONE (1) TABLET BY MOUTH EVERY DAY 90 tablet 3   . metoprolol succinate (TOPROL XL) 25 MG 24 hr tablet Take 1 tablet (25 mg total) by mouth daily. 90 tablet 3   . ondansetron (ZOFRAN ODT) 8 MG disintegrating tablet Take 1 tablet (8 mg total) by mouth 2 (two) times daily as needed for nausea or vomiting. (Patient not taking: Reported on 11/28/2018) 60 tablet 3   . PACLITAXEL IV Inject into the vein once a week.     . pantoprazole (PROTONIX) 40 MG  tablet Take 40 mg by mouth daily.     . prochlorperazine (COMPAZINE) 10 MG tablet Take 1 tablet (10 mg total) by mouth every 6 (six) hours as needed for nausea or vomiting. (Patient not taking: Reported on 11/28/2018) 30 tablet 1   . Propylene Glycol (SYSTANE BALANCE OP) Place 1 drop into both eyes 2 (two) times daily as needed (dry eyes).      [name] scopolamine (TRANSDERM-SCOP) 1 MG/3DAYS Place 1 patch (1.5 mg total) onto the skin every  3 (three) days. (Patient not taking: Reported on 11/28/2018) 10 patch 12   . tamsulosin (FLOMAX) 0.4 MG CAPS capsule Take 0.4 mg by mouth daily.     [name] triamcinolone cream (KENALOG) 0.1 % Apply 1 application topically daily as needed (itching).      . vitamin B-12 (CYANOCOBALAMIN) 1000 MCG tablet Take 1,000 mcg by mouth daily.      Scheduled: . sodium chloride   Intravenous Once  . aspirin EC  81 mg Oral Daily  . Chlorhexidine Gluconate Cloth  6 each Topical Daily  . cholecalciferol  1,000 Units Oral BID  . dorzolamide-timolol  1 drop Left Eye BID  . feeding supplement  1 Container Oral TID BM  . magic mouthwash w/lidocaine  10 mL Oral TID  . metoprolol succinate  50 mg Oral Daily  . pantoprazole  40 mg Oral QAC breakfast  . potassium chloride  40 mEq Oral Once  . sucralfate  1 g Oral TID WC & HS  . tamsulosin  0.4 mg Oral Daily  . vitamin B-12  1,000 mcg Oral Daily   Continuous: . sodium chloride 75 mL/hr at 12/19/18 1649   FFM:[id number], lidocaine, ondansetron **OR** ondansetron (ZOFRAN) IV, oxyCODONE-acetaminophen, [COMPLETED] polyethylene glycol **FOLLOWED BY** polyethylene glycol  Assesment: He came in with odynophagia related to radiation-induced esophagitis.  He is markedly improved.  He was dehydrated on admission which resulted in acute kidney injury.  He has had trouble with hypokalemia and that is doing okay  At baseline he is squamous cell cancer of the lung and has been being treated with chemo and radiation.  It is felt that he needs  short-term rehab. Active Problems:   Squamous cell carcinoma of lung (HCC)   Weakness   Odynophagia   AKI (acute kidney injury) ([place])   Radiation-induced esophagitis   Dehydration   Hypokalemia    Plan: Discharge to skilled care facility when arrangements have been made.  We will see if his chemo and radiation will be on hold while he completes his short-term rehab.    LOS: 8 days   Alonza Bogus 12/21/2018, 8:25 AM

## 2018-12-21 NOTE — TOC Progression Note (Signed)
Transition of Care [facility]) - Progression Note    Patient Details  Name: CARLYLE MCELRATH MRN: [id number] Date of Birth: [birth year]  Transition of [facility]) CM/SW Contact  Shade Flood, LCSW Phone Number: 12/21/2018, 2:35 PM  Clinical Narrative:     Pt stable for dc. Plan is for dc to [facility] for rehab. Awaiting Covid test results. Hopefully pt will be able to transfer tomorrow. Pt and family aware.  Authorization has been given by insurance. Auth number is [id number]. It expires end of the day on 10/23. Will update Keri at [facility]. They need to call insurance when pt arrives at the SNF. Review for continued stay can be faxed to [phone].  Covering TOC will follow up tomorrow.  Expected Discharge Plan: [place] Barriers to Discharge: Continued Medical Work up  Expected Discharge Plan and Services Expected Discharge Plan: [place]   Discharge Planning Services: CM Consult   Living arrangements for the past 2 months: Single Family Home Expected Discharge Date: 12/21/18                                     Social Determinants of Health (SDOH) Interventions    Readmission Risk Interventions Readmission Risk Prevention Plan 12/13/2018  Transportation Screening Complete  Social Work Consult for [place] Planning/Counseling Complete  Medication Review Press photographer) Complete  Some recent data might be hidden

## 2018-12-21 NOTE — Care Management Important Message (Signed)
Important Message  Patient Details  Name: Nicholas Sanchez MRN: [id number] Date of Birth: 1933-09-01   Medicare Important Message Given:  Yes(given to RN to deliver to patient due to contact precautions)     Tommy Medal 12/21/2018, 3:27 PM

## 2018-12-21 NOTE — Discharge Summary (Addendum)
Physician Discharge Summary  Patient ID: Nicholas Sanchez MRN: [id number] DOB/AGE: August 24, 1933 85 y.o. Primary Care Physician:Shearon Clonch, Percell Miller, MD Admit date: 12/12/2018 Discharge date: 12/26/2018    Discharge Diagnoses:   Active Problems:   Squamous cell carcinoma of lung (HCC)   Weakness   Odynophagia   AKI (acute kidney injury) (HCC)   Radiation-induced esophagitis   Dehydration   Hypokalemia Paroxysmal atrial fib Penile edema Allergies as of 12/21/2018      Reactions   Ace Inhibitors Nausea Only   Tape Rash   And Bandaids, too      Medication List    STOP taking these medications   CARBOPLATIN IV   clopidogrel 75 MG tablet Commonly known as: PLAVIX   ferrous sulfate 325 (65 FE) MG tablet   hydrochlorothiazide 25 MG tablet Commonly known as: HYDRODIURIL   losartan 25 MG tablet Commonly known as: COZAAR   lovastatin 10 MG tablet Commonly known as: MEVACOR   PACLITAXEL IV   scopolamine 1 MG/3DAYS Commonly known as: TRANSDERM-SCOP   SYSTANE BALANCE OP   triamcinolone cream 0.1 % Commonly known as: KENALOG     TAKE these medications   acetaminophen 500 MG tablet Commonly known as: TYLENOL Take 500 mg by mouth every 6 (six) hours as needed for pain.   aspirin EC 81 MG tablet Take 81 mg by mouth daily.   cholecalciferol 1000 units tablet Commonly known as: VITAMIN D Take 1,000 Units by mouth 2 (two) times daily.   dorzolamide-timolol 22.3-6.8 MG/ML ophthalmic solution Commonly known as: COSOPT Place 1 drop into the left eye 2 (two) times daily.   lidocaine 2 % solution Commonly known as: XYLOCAINE Use as directed 15 mLs in the mouth or throat every 3 (three) hours as needed for mouth pain.   lidocaine-prilocaine cream Commonly known as: EMLA Apply a dime-sized amount to port a cath site and cover with plastic wrap one hour prior to chemotherapy appointments   magic mouthwash w/lidocaine Soln Take 10 mLs by mouth 3 (three) times daily.    metoprolol succinate 50 MG 24 hr tablet Commonly known as: TOPROL-XL Take 1 tablet (50 mg total) by mouth daily. Take with or immediately following a meal. What changed:   medication strength  how much to take  additional instructions   ondansetron 8 MG disintegrating tablet Commonly known as: Zofran ODT Take 1 tablet (8 mg total) by mouth 2 (two) times daily as needed for nausea or vomiting.   oxyCODONE-acetaminophen 5-325 MG tablet Commonly known as: PERCOCET/ROXICET Take 1 tablet by mouth every 8 (eight) hours as needed for moderate pain.   pantoprazole 40 MG tablet Commonly known as: PROTONIX Take 1 tablet (40 mg total) by mouth daily before breakfast. What changed: when to take this   polyethylene glycol 17 g packet Commonly known as: MIRALAX / GLYCOLAX Take 17 g by mouth daily as needed for mild constipation.   prochlorperazine 10 MG tablet Commonly known as: COMPAZINE Take 1 tablet (10 mg total) by mouth every 6 (six) hours as needed for nausea or vomiting.   sucralfate 1 GM/10ML suspension Commonly known as: CARAFATE Take 10 mLs (1 g total) by mouth 4 (four) times daily -  with meals and at bedtime.   tamsulosin 0.4 MG Caps capsule Commonly known as: FLOMAX Take 0.4 mg by mouth daily.   vitamin B-12 1000 MCG tablet Commonly known as: CYANOCOBALAMIN Take 1,000 mcg by mouth daily.       Discharged Condition: Improved  Consults: Gastroenterology oncology cardiology  Significant Diagnostic Studies: Dg Chest Port 1 View  Result Date: 12/12/2018 CLINICAL DATA:  Cough, weakness. EXAM: PORTABLE CHEST 1 VIEW COMPARISON:  November 18, 2018. FINDINGS: Stable cardiomediastinal silhouette. Stable left-sided pacemaker. Stable right subclavian Port-A-Cath. No pneumothorax or pleural effusion is noted. Both lungs are clear. The visualized skeletal structures are unremarkable. IMPRESSION: No active disease. Electronically Signed   By: Marijo Conception M.D.   On:  12/12/2018 09:38    Lab Results: Basic Metabolic Panel: Recent Labs    12/19/18 0500 12/20/18 0619  NA 145 144  K 4.0 3.5  CL 111 112*  CO2 27 24  GLUCOSE 98 97  BUN 28* 24*  CREATININE 0.93 0.81  CALCIUM 8.1* 8.0*   Liver Function Tests: No results for input(s): AST, ALT, ALKPHOS, BILITOT, PROT, ALBUMIN in the last 72 hours.   CBC: Recent Labs    12/19/18 0500 12/20/18 0619  WBC 2.2* 2.4*  NEUTROABS 1.8 2.0  HGB 8.9* 8.8*  HCT 29.7* 28.7*  MCV 107.2* 107.1*  PLT 66* 66*    Recent Results (from the past 240 hour(s))  SARS CORONAVIRUS 2 (TAT 6-24 HRS) Nasopharyngeal Nasopharyngeal Swab     Status: None   Collection Time: 12/12/18 11:43 AM   Specimen: Nasopharyngeal Swab  Result Value Ref Range Status   SARS Coronavirus 2 NEGATIVE NEGATIVE Final    Comment: (NOTE) SARS-CoV-2 target nucleic acids are NOT DETECTED. The SARS-CoV-2 RNA is generally detectable in upper and lower respiratory specimens during the acute phase of infection. Negative results do not preclude SARS-CoV-2 infection, do not rule out co-infections with other pathogens, and should not be used as the sole basis for treatment or other patient management decisions. Negative results must be combined with clinical observations, patient history, and epidemiological information. The expected result is Negative. Fact Sheet for Patients: SugarRoll.be Fact Sheet for Healthcare Providers: https://www.woods-mathews.com/ This test is not yet approved or cleared by the [place] FDA and  has been authorized for detection and/or diagnosis of SARS-CoV-2 by FDA under an Emergency Use Authorization (EUA). This EUA will remain  in effect (meaning this test can be used) for the duration of the COVID-19 declaration under Section 56 4(b)(1) of the Act, 21 U.S.C. section 360bbb-3(b)(1), unless the authorization is terminated or revoked sooner. Performed at [facility], [place] [address], [place], [place] [address]      Hospital Course: This is an 85 year old who came to the emergency department because of pain when he swallowed.  He was weak.  He was found to be dehydrated.  He had acute kidney injury.  He has received radiation treatments for lung cancer and is also received chemotherapy for lung cancer.  He was treated with PPI viscous lidocaine Magic mouthwash and Carafate.  He underwent endoscopy and had severe radiation changes.  He slowly improved and was able to eat.  It was felt that he needed short-term rehab before he went directly home.  He had bouts of atrial fib with RVR that was treated with increasing his metoprolol and resolved.  He had penile edema and that was treated with Lasix IV and p.o. with good results  Discharge Exam: Blood pressure (!) 143/72, pulse 69, temperature 97.7 F (36.5 C), temperature source Oral, resp. rate 18, height 6\' 1"  (1.854 m), weight 90.4 kg, SpO2 97 %. He is awake and alert.  Chest is clear.  Disposition: To skilled care facility when arrangements are made.  He  is currently on full liquid diet but that can be advanced as tolerated.  He will need the Carafate for at least 7 more days.  He can use the viscous lidocaine as needed and as he improves that may be able to be discontinued.  He needs physical therapy and occupational and speech as needed.  In addition the medications listed on the discharge summary he will be on potassium chloride 40 mEq twice daily x4 doses.  He will need basic metabolic profile after he finishes that to be sure that his potassium level has come up      Signed: Alonza Bogus   12/26/2018

## 2018-12-22 ENCOUNTER — Other Ambulatory Visit: Payer: Self-pay

## 2018-12-22 LAB — GLUCOSE, CAPILLARY: Glucose-Capillary: 74 mg/dL (ref 70–99)

## 2018-12-22 NOTE — Progress Notes (Addendum)
Nutrition Follow-up  DOCUMENTATION CODES:   Not applicable  INTERVENTION:  Please continue these oral supplements with discharge orders: Ensure Enlive po BID, each supplement provides 350 kcal and 20 grams of protein -between meals  Ensure Max TID with meals  Multivitamin with minerals daily  NUTRITION DIAGNOSIS:   Increased nutrient needs(calories/protein) related to cancer and cancer related treatments as evidenced by estimated needs.   GOAL:   Patient will meet greater than or equal to 90% of their needs   MONITOR:   PO intake, Diet advancement, Labs, Weight trends, I & O's, Supplement acceptance  REASON FOR ASSESSMENT:   Malnutrition Screening Tool    ASSESSMENT:  85 year old male with medical history significant for lung cancer, currently on radiation treatment, CAD with prior MI, permanent pacemaker 2017, A-fib, HTN, GERD, recent wt losses who was encouraged by oncologist to [facility] to ED for evaluation of worsening dehydration, soft blood pressures, 1 week history of generalized weakness, poor appetite, and po intake.  Nicholas Sanchez is receiving clear/ full liquid diet since 10/12- day 10. RD visit during lunch today. Patient says he has been unable to eat regular foods due to my esophagus being "raw from radiation". He has finished his soup but is not interested in anything else on his tray. Missing teeth- upper. He rates his appetite as a 4 on a scale of 1-10. Able to feed himself. Inadequate oral intake in the setting of increased needs related to cancer and treatment.   He is waiting for insurance authorization to discharge to skilled facility tomorrow. Patient is very high risk for dehydration and malnutrition given his poor appetite and intake. He likely has acute wt loss due to prolonged hospitalization and poor intake. Need current weight in order to assess.    NUTRITION - FOCUSED PHYSICAL EXAM: Patient continues on precautions.  Diet Order:   Diet Order            Diet full liquid Room service appropriate? Yes; Fluid consistency: Thin  Diet effective now              EDUCATION NEEDS:   No education needs have been identified at this time  Skin:  Skin Assessment: Reviewed RN Assessment  Last BM:  10/22 type 4  Height:   Ht Readings from Last 1 Encounters:  12/12/18 6\' 1"  (1.854 m)    Weight:   Wt Readings from Last 1 Encounters:  12/12/18 90.4 kg    Ideal Body Weight:  83.6 kg  BMI:  Body mass index is 26.29 kg/m.  Estimated Nutritional Needs:   Kcal:  7654-6503 (MSJ 1.3-1.4)  Protein:  117-125 (1.4-1.5 g/kg/IBW)  Fluid:  >/= 2L/day   Colman Cater MS,RD,CSG,LDN Office: [phone] Pager: [phone]

## 2018-12-22 NOTE — TOC Progression Note (Signed)
Transition of Care [facility]) - Progression Note    Patient Details  Name: MARKELLE NAJARIAN MRN: [id number] Date of Birth: 10-17-1933  Transition [facility]) CM/SW Contact  Boneta Lucks, RN Phone Number: 12/22/2018, 11:01 AM  Clinical Narrative:   The [facility] called they are unable to take admission for a week. [facility] also offered a bed. Wife agreed to accept, Jackelyn Poling will start Ins Auth and have a bed ready tomorrow. MD updated.     Expected Discharge Plan: Skilled Nursing Facility Barriers to Discharge: Continued Medical Work up  Expected Discharge Plan and Services Expected Discharge Plan: [place]   Discharge Planning Services: CM Consult   Living arrangements for the past 2 months: Single Family Home Expected Discharge Date: 12/21/18                    Social Determinants of Health (SDOH) Interventions    Readmission Risk Interventions Readmission Risk Prevention Plan 12/21/2018 12/13/2018  Transportation Screening - Complete  PCP or Specialist Appt within 3-5 Days Not Complete -  Not Complete comments Pt discharging to SNF -  [place] or [place] Not Complete -  [place] or Home Care Consult comments Pt discharging to SNF -  Social Work Consult for [place] Planning/Counseling - Complete  Palliative Care Screening Not Applicable -  Medication Review Press photographer) - Complete  Some recent data might be hidden

## 2018-12-22 NOTE — Patient Outreach (Signed)
[facility]) Care Management  12/22/2018  HAROON SHATTO 02-Sep-1933 [id number]   Medication Adherence call to Mr. [place] spoke with patient wife she explain he has been admitted to the hospital. Mr. Wicklund is showing past due on Lovastatin 10 mg under [place].   [facility] Management Direct Dial [phone]  Fax [phone] [email or web address]

## 2018-12-22 NOTE — Progress Notes (Signed)
His Covid testing is negative.  He has been accepted at the [facility] and is ready for transfer today.

## 2018-12-23 ENCOUNTER — Ambulatory Visit (HOSPITAL_COMMUNITY): Payer: Medicare Other

## 2018-12-23 LAB — GLUCOSE, CAPILLARY
Glucose-Capillary: 99 mg/dL (ref 70–99)
Glucose-Capillary: 87 mg/dL (ref 70–99)
Glucose-Capillary: 71 mg/dL (ref 70–99)
Glucose-Capillary: 74 mg/dL (ref 70–99)
Glucose-Capillary: 87 mg/dL (ref 70–99)

## 2018-12-23 MED ORDER — HEPARIN SOD (PORK) LOCK FLUSH 100 UNIT/ML IV SOLN: 500.0000 [IU] | INTRAVENOUS | Status: DC | PRN

## 2018-12-23 NOTE — Care Management Important Message (Signed)
Important Message  Patient Details  Name: Nicholas Sanchez MRN: [id number] Date of Birth: November 23, 1933   Medicare Important Message Given:  Yes     Tommy Medal 12/23/2018, 12:14 PM

## 2018-12-23 NOTE — Progress Notes (Signed)
Subjective: He says he feels okay.  He is still having problems with swallowing but it is getting better.  Plans have been made for him to go to skilled care facility yesterday but they had some sort of problem and are not able to accept patients for 7 days so he is scheduled to go to [place] skilled care facility.  Objective: Vital signs in last 24 hours: Temp:  [97.8 F (36.6 C)-98.3 F (36.8 C)] 98.3 F (36.8 C) (10/23 0508) Pulse Rate:  [69-76] 76 (10/23 0508) Resp:  [18-26] 18 (10/23 0508) BP: (132-163)/(71-72) 132/71 (10/23 0508) SpO2:  [95 %-100 %] 95 % (10/23 0508) Weight change:  Last BM Date: 12/21/18  Intake/Output from previous day: 10/22 0701 - 10/23 0700 In: 772.5 [P.O.:180; I.V.:592.5] Out: 302 [Urine:301; Stool:1]  PHYSICAL EXAM General appearance: alert, cooperative and no distress Resp: clear to auscultation bilaterally Cardio: regular rate and rhythm, S1, S2 normal, no murmur, click, rub or gallop GI: soft, non-tender; bowel sounds normal; no masses,  no organomegaly Extremities: extremities normal, atraumatic, no cyanosis or edema  Lab Results:  Results for orders placed or performed during the hospital encounter of 12/12/18 (from the past 48 hour(s))  SARS CORONAVIRUS 2 (TAT 6-24 HRS) Nasopharyngeal Nasopharyngeal Swab     Status: None   Collection Time: 12/21/18 10:56 AM   Specimen: Nasopharyngeal Swab  Result Value Ref Range   SARS Coronavirus 2 NEGATIVE NEGATIVE    Comment: (NOTE) SARS-CoV-2 target nucleic acids are NOT DETECTED. The SARS-CoV-2 RNA is generally detectable in upper and lower respiratory specimens during the acute phase of infection. Negative results do not preclude SARS-CoV-2 infection, do not rule out co-infections with other pathogens, and should not be used as the sole basis for treatment or other patient management decisions. Negative results must be combined with clinical observations, patient history, and epidemiological  information. The expected result is Negative. Fact Sheet for Patients: SugarRoll.be Fact Sheet for Healthcare Providers: https://www.woods-mathews.com/ This test is not yet approved or cleared by the [place] FDA and  has been authorized for detection and/or diagnosis of SARS-CoV-2 by FDA under an Emergency Use Authorization (EUA). This EUA will remain  in effect (meaning this test can be used) for the duration of the COVID-19 declaration under Section 56 4(b)(1) of the Act, 21 U.S.C. section 360bbb-3(b)(1), unless the authorization is terminated or revoked sooner. Performed at [facility], [place] [address] [place], [place] [address]   Glucose, capillary     Status: None   Collection Time: 12/22/18  9:49 PM  Result Value Ref Range   Glucose-Capillary 74 70 - 99 mg/dL   Comment 1 Notify RN    Comment 2 Document in Chart   Glucose, capillary     Status: None   Collection Time: 12/23/18  1:11 AM  Result Value Ref Range   Glucose-Capillary 99 70 - 99 mg/dL  Glucose, capillary     Status: None   Collection Time: 12/23/18  7:39 AM  Result Value Ref Range   Glucose-Capillary 87 70 - 99 mg/dL    ABGS No results for input(s): PHART, PO2ART, TCO2, HCO3 in the last 72 hours.  Invalid input(s): PCO2 CULTURES Recent Results (from the past 240 hour(s))  SARS CORONAVIRUS 2 (TAT 6-24 HRS) Nasopharyngeal Nasopharyngeal Swab     Status: None   Collection Time: 12/21/18 10:56 AM   Specimen: Nasopharyngeal Swab  Result Value Ref Range Status   SARS Coronavirus 2 NEGATIVE NEGATIVE Final  Comment: (NOTE) SARS-CoV-2 target nucleic acids are NOT DETECTED. The SARS-CoV-2 RNA is generally detectable in upper and lower respiratory specimens during the acute phase of infection. Negative results do not preclude SARS-CoV-2 infection, do not rule out co-infections with other pathogens, and should not be used as the sole basis for treatment or  other patient management decisions. Negative results must be combined with clinical observations, patient history, and epidemiological information. The expected result is Negative. Fact Sheet for Patients: SugarRoll.be Fact Sheet for Healthcare Providers: https://www.woods-mathews.com/ This test is not yet approved or cleared by the [place] FDA and  has been authorized for detection and/or diagnosis of SARS-CoV-2 by FDA under an Emergency Use Authorization (EUA). This EUA will remain  in effect (meaning this test can be used) for the duration of the COVID-19 declaration under Section 56 4(b)(1) of the Act, 21 U.S.C. section 360bbb-3(b)(1), unless the authorization is terminated or revoked sooner. Performed at [facility], [place] [address] [place], [place] [address]    Studies/Results: No results found.  Medications:  Prior to Admission:  Medications Prior to Admission  Medication Sig Dispense Refill Last Dose  . acetaminophen (TYLENOL) 500 MG tablet Take 500 mg by mouth every 6 (six) hours as needed for pain.     [name] aspirin EC 81 MG tablet Take 81 mg by mouth daily.     [name] CARBOPLATIN IV Inject into the vein once a week.     . cholecalciferol (VITAMIN D) 1000 units tablet Take 1,000 Units by mouth 2 (two) times daily.     . clopidogrel (PLAVIX) 75 MG tablet Take 75 mg by mouth daily.     . dorzolamide-timolol (COSOPT) 22.3-6.8 MG/ML ophthalmic solution Place 1 drop into the left eye 2 (two) times daily.     . ferrous sulfate 325 (65 FE) MG tablet Take 325 mg by mouth daily.      . hydrochlorothiazide (HYDRODIURIL) 25 MG tablet TAKE ONE (1) TABLET BY MOUTH EVERY DAY (Patient taking differently: Take 25 mg by mouth daily. ) 90 tablet 3   . lidocaine-prilocaine (EMLA) cream Apply a dime-sized amount to port a cath site and cover with plastic wrap one hour prior to chemotherapy appointments 30 g 0   . losartan (COZAAR) 25 MG tablet  TAKE ONE TABLET BY MOUTY EVERY DAY (Patient taking differently: Take 25 mg by mouth daily. ) 90 tablet 1   . lovastatin (MEVACOR) 10 MG tablet TAKE ONE (1) TABLET BY MOUTH EVERY DAY 90 tablet 3   . metoprolol succinate (TOPROL XL) 25 MG 24 hr tablet Take 1 tablet (25 mg total) by mouth daily. 90 tablet 3   . ondansetron (ZOFRAN ODT) 8 MG disintegrating tablet Take 1 tablet (8 mg total) by mouth 2 (two) times daily as needed for nausea or vomiting. (Patient not taking: Reported on 11/28/2018) 60 tablet 3   . PACLITAXEL IV Inject into the vein once a week.     . pantoprazole (PROTONIX) 40 MG tablet Take 40 mg by mouth daily.     . prochlorperazine (COMPAZINE) 10 MG tablet Take 1 tablet (10 mg total) by mouth every 6 (six) hours as needed for nausea or vomiting. (Patient not taking: Reported on 11/28/2018) 30 tablet 1   . Propylene Glycol (SYSTANE BALANCE OP) Place 1 drop into both eyes 2 (two) times daily as needed (dry eyes).      [name] scopolamine (TRANSDERM-SCOP) 1 MG/3DAYS Place 1 patch (1.5 mg total) onto the skin every  3 (three) days. (Patient not taking: Reported on 11/28/2018) 10 patch 12   . tamsulosin (FLOMAX) 0.4 MG CAPS capsule Take 0.4 mg by mouth daily.     [name] triamcinolone cream (KENALOG) 0.1 % Apply 1 application topically daily as needed (itching).      . vitamin B-12 (CYANOCOBALAMIN) 1000 MCG tablet Take 1,000 mcg by mouth daily.      Scheduled: . sodium chloride   Intravenous Once  . aspirin EC  81 mg Oral Daily  . Chlorhexidine Gluconate Cloth  6 each Topical Daily  . cholecalciferol  1,000 Units Oral BID  . dorzolamide-timolol  1 drop Left Eye BID  . feeding supplement  1 Container Oral TID BM  . magic mouthwash w/lidocaine  10 mL Oral TID  . metoprolol succinate  50 mg Oral Daily  . pantoprazole  40 mg Oral QAC breakfast  . sucralfate  1 g Oral TID WC & HS  . tamsulosin  0.4 mg Oral Daily  . vitamin B-12  1,000 mcg Oral Daily   Continuous: . sodium chloride 75 mL/hr at  12/23/18 0017   JJH:ERDEYCXKGYJEH, lidocaine, ondansetron **OR** ondansetron (ZOFRAN) IV, oxyCODONE-acetaminophen, [COMPLETED] polyethylene glycol **FOLLOWED BY** polyethylene glycol  Assesment: He was admitted with odynophagia from radiation-induced esophagitis.  He is slowly improving.  He has squamous cell carcinoma of the lung and is received radiation and chemotherapy.  His esophagitis is probably related to that  He had acute kidney injury likely related to dehydration from poor oral intake.  He has had hypokalemia and that has been replaced  He has had bursts of atrial fib with RVR and that is much better with increased dose of metoprolol  He has coronary disease at baseline but no symptoms  He has COPD at baseline but no symptoms Active Problems:   Squamous cell carcinoma of lung (HCC)   Weakness   Odynophagia   AKI (acute kidney injury) ([place])   Radiation-induced esophagitis   Dehydration   Hypokalemia    Plan: Transfer to skilled care facility today    LOS: 10 days   Alonza Bogus 12/23/2018, 8:17 AM

## 2018-12-23 NOTE — Progress Notes (Signed)
Physical Therapy Treatment Patient Details Name: Nicholas Sanchez MRN: [id number] DOB: [birth year] Today's Date: 12/23/2018    History of Present Illness Nicholas Sanchez is a 85 y.o. male with medical history significant for stage III squamous cell lung cancer currently on radiation treatment, CAD with prior MI, paroxysmal atrial fibrillation currently off Xarelto, hypertension, GERD, and recent weight loss who presented to the ED at the urging of his oncologist due to worsening dehydration and soft blood pressure readings.  He has had some generalized worsening weakness over the last 1 week and has had poor appetite as well as reduced p.o. intake on account of odynophagia.  He is also had some mild epigastric abdominal tenderness, but no nausea, vomiting, or diarrhea.  No fevers or chills noted.  He denies any chest pain, cough, shortness of breathing, or hemoptysis.  He states that he tries to take his Magic mouthwash at home on account of his ongoing symptoms, but this has not been helping very much.    PT Comments    Patient agreeable and motivated for therapy after eating breakfast.  Patient demonstrates fair/good return for completing BLE ROM/strengthening exercises while seated at bedside, demonstrates labored movement for sit to stands with tendency to pull self up with RW, slightly increased endurance/distance for gait training, but limited secondary to c/o fatigue and tolerated sitting up on commode in bathroom to attempt a bowel movement after therapy call light in reach - NT notified.  Patient will benefit from continued physical therapy in hospital and recommended venue below to increase strength, balance, endurance for safe ADLs and gait.   Follow Up Recommendations  SNF;Supervision for mobility/OOB;Supervision - Intermittent     Equipment Recommendations  None recommended by PT    Recommendations for Other Services       Precautions / Restrictions Precautions Precautions:  Fall Restrictions Weight Bearing Restrictions: No    Mobility  Bed Mobility Overal bed mobility: Modified Independent             General bed mobility comments: increased time  Transfers Overall transfer level: Needs assistance Equipment used: Rolling walker (2 wheeled) Transfers: Sit to/from [facility] Sit to Stand: Min assist Stand pivot transfers: Min assist       General transfer comment: tends to pull self up to sitting using RW, instead of pushing from bed  Ambulation/Gait Ambulation/Gait assistance: Min assist Gait Distance (Feet): 30 Feet Assistive device: Rolling walker (2 wheeled) Gait Pattern/deviations: Decreased step length - right;Decreased step length - left;Decreased stride length Gait velocity: decreased   General Gait Details: slightly labored slow cadence without loss of balance, limited secondary to c/o fatigue   Stairs             Wheelchair Mobility    Modified Rankin (Stroke Patients Only)       Balance Overall balance assessment: Needs assistance Sitting-balance support: Feet supported;No upper extremity supported Sitting balance-Leahy Scale: Good Sitting balance - Comments: seated at bedside   Standing balance support: During functional activity;Bilateral upper extremity supported Standing balance-Leahy Scale: Fair Standing balance comment: using RW                            Cognition Arousal/Alertness: Awake/alert Behavior During Therapy: WFL for tasks assessed/performed Overall Cognitive Status: Within Functional Limits for tasks assessed  Exercises General Exercises - Lower Extremity Long Arc Quad: AROM;Seated;Both;10 reps Hip Flexion/Marching: AROM;Seated;Both;10 reps Toe Raises: AROM;Seated;Both;10 reps Heel Raises: AROM;Seated;Both;10 reps    General Comments        Pertinent Vitals/Pain Pain Assessment: Faces Faces Pain  Scale: Hurts a little bit Pain Location: stomach after eating Pain Descriptors / Indicators: Aching Pain Intervention(s): Limited activity within patient's tolerance;Monitored during session    Home Living                      Prior Function            PT Goals (current goals can now be found in the care plan section) Acute Rehab PT Goals Patient Stated Goal: return home with family to assist PT Goal Formulation: With patient Time For Goal Achievement: 12/27/18 Potential to Achieve Goals: Good Progress towards PT goals: Progressing toward goals    Frequency    Min 3X/week      PT Plan Current plan remains appropriate    Co-evaluation              AM-PAC PT "6 Clicks" Mobility   Outcome Measure  Help needed turning from your back to your side while in a flat bed without using bedrails?: None Help needed moving from lying on your back to sitting on the side of a flat bed without using bedrails?: None Help needed moving to and from a bed to a chair (including a wheelchair)?: A Little Help needed standing up from a chair using your arms (e.g., wheelchair or bedside chair)?: A Little Help needed to walk in hospital room?: A Little Help needed climbing 3-5 steps with a railing? : A Lot 6 Click Score: 19    End of Session   Activity Tolerance: Patient tolerated treatment well;Patient limited by fatigue Patient left: with call bell/phone within reach(left in bathroom seated on commode) Nurse Communication: Mobility status PT Visit Diagnosis: Unsteadiness on feet (R26.81);Other abnormalities of gait and mobility (R26.89);Muscle weakness (generalized) (M62.81)     Time: 6712-4580 PT Time Calculation (min) (ACUTE ONLY): 29 min  Charges:  $Gait Training: 8-22 mins $Therapeutic Exercise: 8-22 mins                     11:14 AM, 12/23/18 Nicholas Sanchez Grandchild, MPT Physical Therapist with [facility] [phone] office [phone] mobile phone

## 2018-12-24 LAB — GLUCOSE, CAPILLARY: Glucose-Capillary: 100 mg/dL — ABNORMAL HIGH (ref 70–99)

## 2018-12-24 NOTE — Progress Notes (Signed)
Subjective: He says he feels about the same.  He still has some pain with swallowing but it is getting better.  He was scheduled for transfer to the skilled care facility yesterday but apparently there is problem with insurance approval so that did not happen.  Objective: Vital signs in last 24 hours: Temp:  [97.3 F (36.3 C)-98.8 F (37.1 C)] 97.9 F (36.6 C) (10/24 0544) Pulse Rate:  [69-70] 70 (10/24 0544) Resp:  [18-24] 18 (10/24 0544) BP: (154-168)/(71-80) 168/80 (10/24 0544) SpO2:  [96 %-100 %] 96 % (10/24 0544) Weight change:  Last BM Date: 12/22/18  Intake/Output from previous day: 10/23 0701 - 10/24 0700 In: 730 [P.O.:730] Out: 6 [Urine:3; Stool:3]  PHYSICAL EXAM General appearance: alert, cooperative and no distress Resp: clear to auscultation bilaterally Cardio: regular rate and rhythm, S1, S2 normal, no murmur, click, rub or gallop GI: soft, non-tender; bowel sounds normal; no masses,  no organomegaly Extremities: extremities normal, atraumatic, no cyanosis or edema  Lab Results:  Results for orders placed or performed during the hospital encounter of 12/12/18 (from the past 48 hour(s))  Glucose, capillary     Status: None   Collection Time: 12/22/18  9:49 PM  Result Value Ref Range   Glucose-Capillary 74 70 - 99 mg/dL   Comment 1 Notify RN    Comment 2 Document in Chart   Glucose, capillary     Status: None   Collection Time: 12/23/18  1:11 AM  Result Value Ref Range   Glucose-Capillary 99 70 - 99 mg/dL  Glucose, capillary     Status: None   Collection Time: 12/23/18  7:39 AM  Result Value Ref Range   Glucose-Capillary 87 70 - 99 mg/dL  Glucose, capillary     Status: None   Collection Time: 12/23/18 11:08 AM  Result Value Ref Range   Glucose-Capillary 71 70 - 99 mg/dL  Glucose, capillary     Status: None   Collection Time: 12/23/18  4:26 PM  Result Value Ref Range   Glucose-Capillary 74 70 - 99 mg/dL  Glucose, capillary     Status: None   Collection  Time: 12/23/18  9:22 PM  Result Value Ref Range   Glucose-Capillary 87 70 - 99 mg/dL   Comment 1 Notify RN    Comment 2 Document in Chart     ABGS No results for input(s): PHART, PO2ART, TCO2, HCO3 in the last 72 hours.  Invalid input(s): PCO2 CULTURES Recent Results (from the past 240 hour(s))  SARS CORONAVIRUS 2 (TAT 6-24 HRS) Nasopharyngeal Nasopharyngeal Swab     Status: None   Collection Time: 12/21/18 10:56 AM   Specimen: Nasopharyngeal Swab  Result Value Ref Range Status   SARS Coronavirus 2 NEGATIVE NEGATIVE Final    Comment: (NOTE) SARS-CoV-2 target nucleic acids are NOT DETECTED. The SARS-CoV-2 RNA is generally detectable in upper and lower respiratory specimens during the acute phase of infection. Negative results do not preclude SARS-CoV-2 infection, do not rule out co-infections with other pathogens, and should not be used as the sole basis for treatment or other patient management decisions. Negative results must be combined with clinical observations, patient history, and epidemiological information. The expected result is Negative. Fact Sheet for Patients: SugarRoll.be Fact Sheet for Healthcare Providers: https://www.woods-mathews.com/ This test is not yet approved or cleared by the [place] FDA and  has been authorized for detection and/or diagnosis of SARS-CoV-2 by FDA under an Emergency Use Authorization (EUA). This EUA will remain  in effect (meaning  this test can be used) for the duration of the COVID-19 declaration under Section 56 4(b)(1) of the Act, 21 U.S.C. section 360bbb-3(b)(1), unless the authorization is terminated or revoked sooner. Performed at [facility], [place] [address] [place], [place] [address]    Studies/Results: No results found.  Medications:  Prior to Admission:  Medications Prior to Admission  Medication Sig Dispense Refill Last Dose  . acetaminophen (TYLENOL) 500 MG tablet  Take 500 mg by mouth every 6 (six) hours as needed for pain.     [name] aspirin EC 81 MG tablet Take 81 mg by mouth daily.     [name] CARBOPLATIN IV Inject into the vein once a week.     . cholecalciferol (VITAMIN D) 1000 units tablet Take 1,000 Units by mouth 2 (two) times daily.     . clopidogrel (PLAVIX) 75 MG tablet Take 75 mg by mouth daily.     . dorzolamide-timolol (COSOPT) 22.3-6.8 MG/ML ophthalmic solution Place 1 drop into the left eye 2 (two) times daily.     . ferrous sulfate 325 (65 FE) MG tablet Take 325 mg by mouth daily.      . hydrochlorothiazide (HYDRODIURIL) 25 MG tablet TAKE ONE (1) TABLET BY MOUTH EVERY DAY (Patient taking differently: Take 25 mg by mouth daily. ) 90 tablet 3   . lidocaine-prilocaine (EMLA) cream Apply a dime-sized amount to port a cath site and cover with plastic wrap one hour prior to chemotherapy appointments 30 g 0   . losartan (COZAAR) 25 MG tablet TAKE ONE TABLET BY MOUTY EVERY DAY (Patient taking differently: Take 25 mg by mouth daily. ) 90 tablet 1   . lovastatin (MEVACOR) 10 MG tablet TAKE ONE (1) TABLET BY MOUTH EVERY DAY 90 tablet 3   . metoprolol succinate (TOPROL XL) 25 MG 24 hr tablet Take 1 tablet (25 mg total) by mouth daily. 90 tablet 3   . ondansetron (ZOFRAN ODT) 8 MG disintegrating tablet Take 1 tablet (8 mg total) by mouth 2 (two) times daily as needed for nausea or vomiting. (Patient not taking: Reported on 11/28/2018) 60 tablet 3   . PACLITAXEL IV Inject into the vein once a week.     . pantoprazole (PROTONIX) 40 MG tablet Take 40 mg by mouth daily.     . prochlorperazine (COMPAZINE) 10 MG tablet Take 1 tablet (10 mg total) by mouth every 6 (six) hours as needed for nausea or vomiting. (Patient not taking: Reported on 11/28/2018) 30 tablet 1   . Propylene Glycol (SYSTANE BALANCE OP) Place 1 drop into both eyes 2 (two) times daily as needed (dry eyes).      [name] scopolamine (TRANSDERM-SCOP) 1 MG/3DAYS Place 1 patch (1.5 mg total) onto the skin every 3  (three) days. (Patient not taking: Reported on 11/28/2018) 10 patch 12   . tamsulosin (FLOMAX) 0.4 MG CAPS capsule Take 0.4 mg by mouth daily.     [name] triamcinolone cream (KENALOG) 0.1 % Apply 1 application topically daily as needed (itching).      . vitamin B-12 (CYANOCOBALAMIN) 1000 MCG tablet Take 1,000 mcg by mouth daily.      Scheduled: . sodium chloride   Intravenous Once  . aspirin EC  81 mg Oral Daily  . Chlorhexidine Gluconate Cloth  6 each Topical Daily  . cholecalciferol  1,000 Units Oral BID  . dorzolamide-timolol  1 drop Left Eye BID  . feeding supplement  1 Container Oral TID BM  . magic mouthwash w/lidocaine  10 mL Oral TID  . metoprolol succinate  50 mg Oral Daily  . pantoprazole  40 mg Oral QAC breakfast  . sucralfate  1 g Oral TID WC & HS  . tamsulosin  0.4 mg Oral Daily  . vitamin B-12  1,000 mcg Oral Daily   Continuous: . sodium chloride 75 mL/hr at 12/24/18 8675   QGB:[id number], heparin lock flush, lidocaine, ondansetron **OR** ondansetron (ZOFRAN) IV, oxyCODONE-acetaminophen, [COMPLETED] polyethylene glycol **FOLLOWED BY** polyethylene glycol  Assesment: He was admitted with odynophagia.  He had dehydration because he would did not have adequate oral intake and had acute kidney injury from that.  He is improving.  He has radiation-induced esophagitis unchanged  He was hypokalemic and that has been replaced and will be rechecked tomorrow  He had pancytopenia from chemotherapy and radiation and that will be rechecked tomorrow  He has squamous cell carcinoma of the lung which is being treated Active Problems:   Squamous cell carcinoma of lung (HCC)   Weakness   Odynophagia   AKI (acute kidney injury) ([place])   Radiation-induced esophagitis   Dehydration   Hypokalemia    Plan: To skilled care facility when arrangements are made    LOS: 11 days   Nicholas Sanchez 12/24/2018, 9:06 AM

## 2018-12-24 NOTE — TOC Progression Note (Signed)
Transition of Care [facility]) - Progression Note    Patient Details  Name: Nicholas Sanchez MRN: [id number] Date of Birth: 11/26/1933  [facility] CM/SW Contact  Latanya Maudlin, RN Phone Number: 12/24/2018, 2:06 PM  Clinical Narrative:  TOC spoke with Debbie in admissions at [facility]. Per Jackelyn Poling authorization has still not yet been received. She will notify TOC once obtained. Will not need new COVID test prior to discharge.     Expected Discharge Plan: Skilled Nursing Facility Barriers to Discharge: Continued Medical Work up  Expected Discharge Plan and Services Expected Discharge Plan: [place]   Discharge Planning Services: CM Consult   Living arrangements for the past 2 months: Single Family Home Expected Discharge Date: 12/23/18                                     Social Determinants of Health (SDOH) Interventions    Readmission Risk Interventions Readmission Risk Prevention Plan 12/21/2018 12/13/2018  Transportation Screening - Complete  PCP or Specialist Appt within 3-5 Days Not Complete -  Not Complete comments Pt discharging to SNF -  [place] or [place] Not Complete -  [place] or Home Care Consult comments Pt discharging to SNF -  Social Work Consult for [place] Planning/Counseling - Complete  Palliative Care Screening Not Applicable -  Medication Review Press photographer) - Complete  Some recent data might be hidden

## 2018-12-25 LAB — CBC WITH DIFFERENTIAL/PLATELET
Abs Immature Granulocytes: 0 10*3/uL (ref 0.00–0.07)
Basophils Absolute: 0 10*3/uL (ref 0.0–0.1)
Basophils Relative: 0 %
Eosinophils Absolute: 0 10*3/uL (ref 0.0–0.5)
Eosinophils Relative: 0 %
HCT: 26.3 % — ABNORMAL LOW (ref 39.0–52.0)
Hemoglobin: 8.2 g/dL — ABNORMAL LOW (ref 13.0–17.0)
Immature Granulocytes: 0 %
Lymphocytes Relative: 8 %
Lymphs Abs: 0.2 10*3/uL — ABNORMAL LOW (ref 0.7–4.0)
MCH: 33.1 pg (ref 26.0–34.0)
MCHC: 31.2 g/dL (ref 30.0–36.0)
MCV: 106 fL — ABNORMAL HIGH (ref 80.0–100.0)
Monocytes Absolute: 0.2 10*3/uL (ref 0.1–1.0)
Monocytes Relative: 6 %
Neutro Abs: 2.5 10*3/uL (ref 1.7–7.7)
Neutrophils Relative %: 86 %
Platelets: 88 10*3/uL — ABNORMAL LOW (ref 150–400)
RBC: 2.48 MIL/uL — ABNORMAL LOW (ref 4.22–5.81)
RDW: 22.3 % — ABNORMAL HIGH (ref 11.5–15.5)
WBC: 2.9 10*3/uL — ABNORMAL LOW (ref 4.0–10.5)
nRBC: 0 % (ref 0.0–0.2)

## 2018-12-25 LAB — COMPREHENSIVE METABOLIC PANEL
ALT: 18 U/L (ref 0–44)
AST: 16 U/L (ref 15–41)
Albumin: 2.8 g/dL — ABNORMAL LOW (ref 3.5–5.0)
Alkaline Phosphatase: 44 U/L (ref 38–126)
Anion gap: 7 (ref 5–15)
BUN: 16 mg/dL (ref 8–23)
CO2: 22 mmol/L (ref 22–32)
Calcium: 7.8 mg/dL — ABNORMAL LOW (ref 8.9–10.3)
Chloride: 111 mmol/L (ref 98–111)
Creatinine, Ser: 0.85 mg/dL (ref 0.61–1.24)
GFR calc Af Amer: >60 mL/min (ref >60)
GFR calc non Af Amer: >60 mL/min (ref >60)
Glucose, Bld: 100 mg/dL — ABNORMAL HIGH (ref 70–99)
Potassium: 3.1 mmol/L — ABNORMAL LOW (ref 3.5–5.1)
Sodium: 140 mmol/L (ref 135–145)
Total Bilirubin: 1.6 mg/dL — ABNORMAL HIGH (ref 0.3–1.2)
Total Protein: 5.1 g/dL — ABNORMAL LOW (ref 6.5–8.1)

## 2018-12-25 LAB — GLUCOSE, CAPILLARY
Glucose-Capillary: 52 mg/dL — ABNORMAL LOW (ref 70–99)
Glucose-Capillary: 138 mg/dL — ABNORMAL HIGH (ref 70–99)

## 2018-12-25 MED ORDER — FUROSEMIDE 10 MG/ML IJ SOLN
40.0000 mg | Freq: Every day | INTRAMUSCULAR | Status: DC
  Administered 2018-12-25: 09:00:00 40 mg via INTRAVENOUS
  Filled 2018-12-25 (×2): qty 4

## 2018-12-25 MED ORDER — POTASSIUM CHLORIDE CRYS ER 20 MEQ PO TBCR
40.0000 meq | EXTENDED_RELEASE_TABLET | Freq: Two times a day (BID) | ORAL | Status: DC
  Administered 2018-12-25 – 2018-12-26 (×3): 40 meq via ORAL
  Filled 2018-12-25 (×3): qty 2

## 2018-12-25 NOTE — Progress Notes (Signed)
Subjective: He says he feels fair.  He has scrotal and penile edema.  He has what looks like some third spacing of fluid in his arms and legs.  He is able to eat a little better.  Objective: Vital signs in last 24 hours: Temp:  [97.8 F (36.6 C)-98.4 F (36.9 C)] 98.4 F (36.9 C) (10/25 0455) Pulse Rate:  [69-70] 70 (10/25 0455) Resp:  [18] 18 (10/25 0455) BP: (137-155)/(65-72) 155/72 (10/25 0455) SpO2:  [95 %-98 %] 95 % (10/25 0455) Weight change:  Last BM Date: 12/24/18  Intake/Output from previous day: 10/24 0701 - 10/25 0700 In: 1920 [P.O.:1920] Out: 10 [Urine:6; Stool:4]  PHYSICAL EXAM General appearance: alert, cooperative and no distress Resp: clear to auscultation bilaterally Cardio: regular rate and rhythm, S1, S2 normal, no murmur, click, rub or gallop GI: soft, non-tender; bowel sounds normal; no masses,  no organomegaly Extremities: extremities normal, atraumatic, no cyanosis or edema  Lab Results:  Results for orders placed or performed during the hospital encounter of 12/12/18 (from the past 48 hour(s))  Glucose, capillary     Status: None   Collection Time: 12/23/18 11:08 AM  Result Value Ref Range   Glucose-Capillary 71 70 - 99 mg/dL  Glucose, capillary     Status: None   Collection Time: 12/23/18  4:26 PM  Result Value Ref Range   Glucose-Capillary 74 70 - 99 mg/dL  Glucose, capillary     Status: None   Collection Time: 12/23/18  9:22 PM  Result Value Ref Range   Glucose-Capillary 87 70 - 99 mg/dL   Comment 1 Notify RN    Comment 2 Document in Chart   Glucose, capillary     Status: Abnormal   Collection Time: 12/24/18  9:03 PM  Result Value Ref Range   Glucose-Capillary 100 (H) 70 - 99 mg/dL   Comment 1 Notify RN    Comment 2 Document in Chart   CBC with Differential/Platelet     Status: Abnormal   Collection Time: 12/25/18  6:45 AM  Result Value Ref Range   WBC 2.9 (L) 4.0 - 10.5 K/uL   RBC 2.48 (L) 4.22 - 5.81 MIL/uL   Hemoglobin 8.2 (L) 13.0  - 17.0 g/dL   HCT 26.3 (L) 39.0 - 52.0 %   MCV 106.0 (H) 80.0 - 100.0 fL   MCH 33.1 26.0 - 34.0 pg   MCHC 31.2 30.0 - 36.0 g/dL   RDW 22.3 (H) 11.5 - 15.5 %   Platelets 88 (L) 150 - 400 K/uL    Comment: REPEATED TO VERIFY PLATELET COUNT CONFIRMED BY SMEAR SPECIMEN CHECKED FOR CLOTS Immature Platelet Fraction may be clinically indicated, consider ordering this additional test [id number]    nRBC 0.0 0.0 - 0.2 %   Neutrophils Relative % 86 %   Neutro Abs 2.5 1.7 - 7.7 K/uL   Lymphocytes Relative 8 %   Lymphs Abs 0.2 (L) 0.7 - 4.0 K/uL   Monocytes Relative 6 %   Monocytes Absolute 0.2 0.1 - 1.0 K/uL   Eosinophils Relative 0 %   Eosinophils Absolute 0.0 0.0 - 0.5 K/uL   Basophils Relative 0 %   Basophils Absolute 0.0 0.0 - 0.1 K/uL   Immature Granulocytes 0 %   Abs Immature Granulocytes 0.00 0.00 - 0.07 K/uL   Spherocytes PRESENT     Comment: Performed at [facility], [address], [place], [place] [address]  Comprehensive metabolic panel     Status: Abnormal   Collection Time: 12/25/18  6:45 AM  Result Value Ref Range   Sodium 140 135 - 145 mmol/L   Potassium 3.1 (L) 3.5 - 5.1 mmol/L   Chloride 111 98 - 111 mmol/L   CO2 22 22 - 32 mmol/L   Glucose, Bld 100 (H) 70 - 99 mg/dL   BUN 16 8 - 23 mg/dL   Creatinine, Ser 0.85 0.61 - 1.24 mg/dL   Calcium 7.8 (L) 8.9 - 10.3 mg/dL   Total Protein 5.1 (L) 6.5 - 8.1 g/dL   Albumin 2.8 (L) 3.5 - 5.0 g/dL   AST 16 15 - 41 U/L   ALT 18 0 - 44 U/L   Alkaline Phosphatase 44 38 - 126 U/L   Total Bilirubin 1.6 (H) 0.3 - 1.2 mg/dL   GFR calc non Af Amer >60 >60 mL/min   GFR calc Af Amer >60 >60 mL/min   Anion gap 7 5 - 15    Comment: Performed at [facility], [address], [place], [place] [address]    ABGS No results for input(s): PHART, PO2ART, TCO2, HCO3 in the last 72 hours.  Invalid input(s): PCO2 CULTURES Recent Results (from the past 240 hour(s))  SARS CORONAVIRUS 2 (TAT 6-24 HRS) Nasopharyngeal Nasopharyngeal Swab      Status: None   Collection Time: 12/21/18 10:56 AM   Specimen: Nasopharyngeal Swab  Result Value Ref Range Status   SARS Coronavirus 2 NEGATIVE NEGATIVE Final    Comment: (NOTE) SARS-CoV-2 target nucleic acids are NOT DETECTED. The SARS-CoV-2 RNA is generally detectable in upper and lower respiratory specimens during the acute phase of infection. Negative results do not preclude SARS-CoV-2 infection, do not rule out co-infections with other pathogens, and should not be used as the sole basis for treatment or other patient management decisions. Negative results must be combined with clinical observations, patient history, and epidemiological information. The expected result is Negative. Fact Sheet for Patients: SugarRoll.be Fact Sheet for Healthcare Providers: https://www.woods-mathews.com/ This test is not yet approved or cleared by the [place] FDA and  has been authorized for detection and/or diagnosis of SARS-CoV-2 by FDA under an Emergency Use Authorization (EUA). This EUA will remain  in effect (meaning this test can be used) for the duration of the COVID-19 declaration under Section 56 4(b)(1) of the Act, 21 U.S.C. section 360bbb-3(b)(1), unless the authorization is terminated or revoked sooner. Performed at [facility], [place] [address] [place], [place] [address]    Studies/Results: No results found.  Medications:  Prior to Admission:  Medications Prior to Admission  Medication Sig Dispense Refill Last Dose  . acetaminophen (TYLENOL) 500 MG tablet Take 500 mg by mouth every 6 (six) hours as needed for pain.     [name] aspirin EC 81 MG tablet Take 81 mg by mouth daily.     [name] CARBOPLATIN IV Inject into the vein once a week.     . cholecalciferol (VITAMIN D) 1000 units tablet Take 1,000 Units by mouth 2 (two) times daily.     . clopidogrel (PLAVIX) 75 MG tablet Take 75 mg by mouth daily.     . dorzolamide-timolol (COSOPT)  22.3-6.8 MG/ML ophthalmic solution Place 1 drop into the left eye 2 (two) times daily.     . ferrous sulfate 325 (65 FE) MG tablet Take 325 mg by mouth daily.      . hydrochlorothiazide (HYDRODIURIL) 25 MG tablet TAKE ONE (1) TABLET BY MOUTH EVERY DAY (Patient taking differently: Take 25 mg by mouth daily. ) 90  tablet 3   . lidocaine-prilocaine (EMLA) cream Apply a dime-sized amount to port a cath site and cover with plastic wrap one hour prior to chemotherapy appointments 30 g 0   . losartan (COZAAR) 25 MG tablet TAKE ONE TABLET BY MOUTY EVERY DAY (Patient taking differently: Take 25 mg by mouth daily. ) 90 tablet 1   . lovastatin (MEVACOR) 10 MG tablet TAKE ONE (1) TABLET BY MOUTH EVERY DAY 90 tablet 3   . metoprolol succinate (TOPROL XL) 25 MG 24 hr tablet Take 1 tablet (25 mg total) by mouth daily. 90 tablet 3   . ondansetron (ZOFRAN ODT) 8 MG disintegrating tablet Take 1 tablet (8 mg total) by mouth 2 (two) times daily as needed for nausea or vomiting. (Patient not taking: Reported on 11/28/2018) 60 tablet 3   . PACLITAXEL IV Inject into the vein once a week.     . pantoprazole (PROTONIX) 40 MG tablet Take 40 mg by mouth daily.     . prochlorperazine (COMPAZINE) 10 MG tablet Take 1 tablet (10 mg total) by mouth every 6 (six) hours as needed for nausea or vomiting. (Patient not taking: Reported on 11/28/2018) 30 tablet 1   . Propylene Glycol (SYSTANE BALANCE OP) Place 1 drop into both eyes 2 (two) times daily as needed (dry eyes).      [name] scopolamine (TRANSDERM-SCOP) 1 MG/3DAYS Place 1 patch (1.5 mg total) onto the skin every 3 (three) days. (Patient not taking: Reported on 11/28/2018) 10 patch 12   . tamsulosin (FLOMAX) 0.4 MG CAPS capsule Take 0.4 mg by mouth daily.     [name] triamcinolone cream (KENALOG) 0.1 % Apply 1 application topically daily as needed (itching).      . vitamin B-12 (CYANOCOBALAMIN) 1000 MCG tablet Take 1,000 mcg by mouth daily.      Scheduled: . sodium chloride   Intravenous  Once  . aspirin EC  81 mg Oral Daily  . Chlorhexidine Gluconate Cloth  6 each Topical Daily  . cholecalciferol  1,000 Units Oral BID  . dorzolamide-timolol  1 drop Left Eye BID  . feeding supplement  1 Container Oral TID BM  . magic mouthwash w/lidocaine  10 mL Oral TID  . metoprolol succinate  50 mg Oral Daily  . pantoprazole  40 mg Oral QAC breakfast  . sucralfate  1 g Oral TID WC & HS  . tamsulosin  0.4 mg Oral Daily  . vitamin B-12  1,000 mcg Oral Daily   Continuous: . sodium chloride 75 mL/hr at 12/24/18 1736   YYQ:[id number], heparin lock flush, lidocaine, ondansetron **OR** ondansetron (ZOFRAN) IV, oxyCODONE-acetaminophen, [COMPLETED] polyethylene glycol **FOLLOWED BY** polyethylene glycol  Assesment: He was admitted with odynophagia from radiation-induced esophagitis.  He has squamous cell carcinoma of the lung and has been receiving radiation and chemotherapy.  He was pancytopenic on admission which is better  He had acute kidney injury and that is improved.  Likely related to dehydration  He now has what seems to be some third spacing of fluid and has some scrotal and penile edema Active Problems:   Squamous cell carcinoma of lung (HCC)   Weakness   Odynophagia   AKI (acute kidney injury) ([place])   Radiation-induced esophagitis   Dehydration   Hypokalemia    Plan: Start Lasix    LOS: 12 days   Alonza Bogus 12/25/2018, 8:34 AM

## 2018-12-26 ENCOUNTER — Other Ambulatory Visit (HOSPITAL_COMMUNITY): Payer: Medicare Other

## 2018-12-26 ENCOUNTER — Ambulatory Visit (HOSPITAL_COMMUNITY): Payer: Medicare Other | Admitting: Hematology

## 2018-12-26 ENCOUNTER — Ambulatory Visit (HOSPITAL_COMMUNITY): Payer: Medicare Other

## 2018-12-26 DIAGNOSIS — R2689 Other abnormalities of gait and mobility: Secondary | ICD-10-CM | POA: Diagnosis not present

## 2018-12-26 DIAGNOSIS — L98411 Non-pressure chronic ulcer of buttock limited to breakdown of skin: Secondary | ICD-10-CM | POA: Diagnosis not present

## 2018-12-26 DIAGNOSIS — R1312 Dysphagia, oropharyngeal phase: Secondary | ICD-10-CM | POA: Diagnosis not present

## 2018-12-26 DIAGNOSIS — E860 Dehydration: Secondary | ICD-10-CM | POA: Diagnosis not present

## 2018-12-26 DIAGNOSIS — I480 Paroxysmal atrial fibrillation: Secondary | ICD-10-CM | POA: Diagnosis not present

## 2018-12-26 DIAGNOSIS — I10 Essential (primary) hypertension: Secondary | ICD-10-CM | POA: Diagnosis not present

## 2018-12-26 DIAGNOSIS — K2210 Ulcer of esophagus without bleeding: Secondary | ICD-10-CM | POA: Diagnosis not present

## 2018-12-26 DIAGNOSIS — D519 Vitamin B12 deficiency anemia, unspecified: Secondary | ICD-10-CM | POA: Diagnosis not present

## 2018-12-26 DIAGNOSIS — Z7902 Long term (current) use of antithrombotics/antiplatelets: Secondary | ICD-10-CM | POA: Diagnosis not present

## 2018-12-26 DIAGNOSIS — Z1159 Encounter for screening for other viral diseases: Secondary | ICD-10-CM | POA: Diagnosis not present

## 2018-12-26 DIAGNOSIS — C3492 Malignant neoplasm of unspecified part of left bronchus or lung: Secondary | ICD-10-CM | POA: Diagnosis not present

## 2018-12-26 DIAGNOSIS — Z923 Personal history of irradiation: Secondary | ICD-10-CM | POA: Diagnosis not present

## 2018-12-26 DIAGNOSIS — C3490 Malignant neoplasm of unspecified part of unspecified bronchus or lung: Secondary | ICD-10-CM | POA: Diagnosis not present

## 2018-12-26 DIAGNOSIS — Z5111 Encounter for antineoplastic chemotherapy: Secondary | ICD-10-CM | POA: Diagnosis present

## 2018-12-26 DIAGNOSIS — C3432 Malignant neoplasm of lower lobe, left bronchus or lung: Secondary | ICD-10-CM | POA: Diagnosis not present

## 2018-12-26 DIAGNOSIS — Z79899 Other long term (current) drug therapy: Secondary | ICD-10-CM | POA: Diagnosis not present

## 2018-12-26 DIAGNOSIS — E876 Hypokalemia: Secondary | ICD-10-CM | POA: Diagnosis not present

## 2018-12-26 DIAGNOSIS — K219 Gastro-esophageal reflux disease without esophagitis: Secondary | ICD-10-CM | POA: Diagnosis not present

## 2018-12-26 DIAGNOSIS — T66XXXA Radiation sickness, unspecified, initial encounter: Secondary | ICD-10-CM | POA: Diagnosis not present

## 2018-12-26 DIAGNOSIS — K208 Other esophagitis without bleeding: Secondary | ICD-10-CM | POA: Diagnosis not present

## 2018-12-26 DIAGNOSIS — D649 Anemia, unspecified: Secondary | ICD-10-CM | POA: Diagnosis not present

## 2018-12-26 DIAGNOSIS — D509 Iron deficiency anemia, unspecified: Secondary | ICD-10-CM | POA: Diagnosis not present

## 2018-12-26 DIAGNOSIS — Z7409 Other reduced mobility: Secondary | ICD-10-CM | POA: Diagnosis not present

## 2018-12-26 DIAGNOSIS — R52 Pain, unspecified: Secondary | ICD-10-CM | POA: Diagnosis not present

## 2018-12-26 DIAGNOSIS — E785 Hyperlipidemia, unspecified: Secondary | ICD-10-CM | POA: Diagnosis not present

## 2018-12-26 DIAGNOSIS — R1310 Dysphagia, unspecified: Secondary | ICD-10-CM | POA: Diagnosis not present

## 2018-12-26 DIAGNOSIS — M6281 Muscle weakness (generalized): Secondary | ICD-10-CM | POA: Diagnosis not present

## 2018-12-26 DIAGNOSIS — I2510 Atherosclerotic heart disease of native coronary artery without angina pectoris: Secondary | ICD-10-CM | POA: Diagnosis not present

## 2018-12-26 LAB — COMPREHENSIVE METABOLIC PANEL
ALT: 20 U/L (ref 0–44)
AST: 19 U/L (ref 15–41)
Albumin: 2.9 g/dL — ABNORMAL LOW (ref 3.5–5.0)
Alkaline Phosphatase: 47 U/L (ref 38–126)
Anion gap: 7 (ref 5–15)
BUN: 14 mg/dL (ref 8–23)
CO2: 25 mmol/L (ref 22–32)
Calcium: 7.9 mg/dL — ABNORMAL LOW (ref 8.9–10.3)
Chloride: 109 mmol/L (ref 98–111)
Creatinine, Ser: 0.98 mg/dL (ref 0.61–1.24)
GFR calc Af Amer: >60 mL/min (ref >60)
GFR calc non Af Amer: >60 mL/min (ref >60)
Glucose, Bld: 99 mg/dL (ref 70–99)
Potassium: 3.1 mmol/L — ABNORMAL LOW (ref 3.5–5.1)
Sodium: 141 mmol/L (ref 135–145)
Total Bilirubin: 1.9 mg/dL — ABNORMAL HIGH (ref 0.3–1.2)
Total Protein: 5.3 g/dL — ABNORMAL LOW (ref 6.5–8.1)

## 2018-12-26 MED ORDER — POTASSIUM CHLORIDE CRYS ER 20 MEQ PO TBCR: 40.0000 meq | EXTENDED_RELEASE_TABLET | Freq: Two times a day (BID) | ORAL | Status: DC

## 2018-12-26 MED ORDER — FUROSEMIDE 20 MG PO TABS
20.0000 mg | ORAL_TABLET | Freq: Once | ORAL | Status: AC
  Administered 2018-12-26: 09:00:00 20 mg via ORAL
  Filled 2018-12-26: qty 1

## 2018-12-26 NOTE — Progress Notes (Signed)
Nsg Discharge Note  Admit Date:  12/12/2018 Discharge date: 12/26/2018   Beverly Sessions to be D/C'd Skilled nursing facility per MD order.  AVS completed.  Copy for chart, and copy for patient signed, and dated. Patient/caregiver able to verbalize understanding.  Discharge Medication: Allergies as of 12/26/2018      Reactions   Ace Inhibitors Nausea Only   Tape Rash   And Bandaids, too      Medication List    STOP taking these medications   CARBOPLATIN IV   clopidogrel 75 MG tablet Commonly known as: PLAVIX   ferrous sulfate 325 (65 FE) MG tablet   hydrochlorothiazide 25 MG tablet Commonly known as: HYDRODIURIL   losartan 25 MG tablet Commonly known as: COZAAR   lovastatin 10 MG tablet Commonly known as: MEVACOR   PACLITAXEL IV   scopolamine 1 MG/3DAYS Commonly known as: TRANSDERM-SCOP   SYSTANE BALANCE OP   triamcinolone cream 0.1 % Commonly known as: KENALOG     TAKE these medications   acetaminophen 500 MG tablet Commonly known as: TYLENOL Take 500 mg by mouth every 6 (six) hours as needed for pain.   aspirin EC 81 MG tablet Take 81 mg by mouth daily.   cholecalciferol 1000 units tablet Commonly known as: VITAMIN D Take 1,000 Units by mouth 2 (two) times daily.   dorzolamide-timolol 22.3-6.8 MG/ML ophthalmic solution Commonly known as: COSOPT Place 1 drop into the left eye 2 (two) times daily.   lidocaine 2 % solution Commonly known as: XYLOCAINE Use as directed 15 mLs in the mouth or throat every 3 (three) hours as needed for mouth pain.   lidocaine-prilocaine cream Commonly known as: EMLA Apply a dime-sized amount to port a cath site and cover with plastic wrap one hour prior to chemotherapy appointments   magic mouthwash w/lidocaine Soln Take 10 mLs by mouth 3 (three) times daily.   metoprolol succinate 50 MG 24 hr tablet Commonly known as: TOPROL-XL Take 1 tablet (50 mg total) by mouth daily. Take with or immediately following a  meal. What changed:   medication strength  how much to take  additional instructions   ondansetron 8 MG disintegrating tablet Commonly known as: Zofran ODT Take 1 tablet (8 mg total) by mouth 2 (two) times daily as needed for nausea or vomiting.   oxyCODONE-acetaminophen 5-325 MG tablet Commonly known as: PERCOCET/ROXICET Take 1 tablet by mouth every 8 (eight) hours as needed for moderate pain.   pantoprazole 40 MG tablet Commonly known as: PROTONIX Take 1 tablet (40 mg total) by mouth daily before breakfast. What changed: when to take this   polyethylene glycol 17 g packet Commonly known as: MIRALAX / GLYCOLAX Take 17 g by mouth daily as needed for mild constipation.   potassium chloride SA 20 MEQ tablet Commonly known as: KLOR-CON Take 2 tablets (40 mEq total) by mouth 2 (two) times daily. For 2 days   prochlorperazine 10 MG tablet Commonly known as: COMPAZINE Take 1 tablet (10 mg total) by mouth every 6 (six) hours as needed for nausea or vomiting.   sucralfate 1 GM/10ML suspension Commonly known as: CARAFATE Take 10 mLs (1 g total) by mouth 4 (four) times daily -  with meals and at bedtime.   tamsulosin 0.4 MG Caps capsule Commonly known as: FLOMAX Take 0.4 mg by mouth daily.   vitamin B-12 1000 MCG tablet Commonly known as: CYANOCOBALAMIN Take 1,000 mcg by mouth daily.       Discharge Assessment: Vitals:  12/26/18 0553 12/26/18 0817  BP: 128/63   Pulse: 79   Resp: 20   Temp: 98.4 F (36.9 C)   SpO2: 92% 93%   Skin clean, dry and intact without evidence of skin break down, no evidence of skin tears noted. IV catheter discontinued intact. Site without signs and symptoms of complications - no redness or edema noted at insertion site, patient denies c/o pain - only slight tenderness at site.  Dressing with slight pressure applied.  D/c Instructions-Education: Discharge instructions given to patient/family with verbalized understanding. D/c education  completed with patient/family including follow up instructions, medication list, d/c activities limitations if indicated, with other d/c instructions as indicated by MD - patient able to verbalize understanding, all questions fully answered. Patient instructed to return to ED, call 911, or call MD for any changes in condition.  Patient escorted via [facility], and D/C home via EMS  Loa Socks, RN 12/26/2018 2:23 PM

## 2018-12-26 NOTE — Progress Notes (Signed)
Report called to nurse at [facility]

## 2018-12-26 NOTE — Progress Notes (Signed)
Subjective: He feels better.  He is still substantially deconditioned.  His edema is much improved.  His swallowing is about the same  Objective: Vital signs in last 24 hours: Temp:  [98.2 F (36.8 C)-98.8 F (37.1 C)] 98.4 F (36.9 C) (10/26 0553) Pulse Rate:  [68-79] 79 (10/26 0553) Resp:  [18-20] 20 (10/26 0553) BP: (128-164)/(63-70) 128/63 (10/26 0553) SpO2:  [92 %-97 %] 93 % (10/26 0817) Weight change:  Last BM Date: 12/24/18  Intake/Output from previous day: 10/25 0701 - 10/26 0700 In: 840 [P.O.:840] Out: 1701 [Urine:1701]  PHYSICAL EXAM General appearance: alert, cooperative and no distress Resp: clear to auscultation bilaterally Cardio: regular rate and rhythm, S1, S2 normal, no murmur, click, rub or gallop GI: He has third spacing of fluid Extremities: He has third spacing of fluid  Lab Results:  Results for orders placed or performed during the hospital encounter of 12/12/18 (from the past 48 hour(s))  Glucose, capillary     Status: Abnormal   Collection Time: 12/24/18  9:03 PM  Result Value Ref Range   Glucose-Capillary 100 (H) 70 - 99 mg/dL   Comment 1 Notify RN    Comment 2 Document in Chart   CBC with Differential/Platelet     Status: Abnormal   Collection Time: 12/25/18  6:45 AM  Result Value Ref Range   WBC 2.9 (L) 4.0 - 10.5 K/uL   RBC 2.48 (L) 4.22 - 5.81 MIL/uL   Hemoglobin 8.2 (L) 13.0 - 17.0 g/dL   HCT 26.3 (L) 39.0 - 52.0 %   MCV 106.0 (H) 80.0 - 100.0 fL   MCH 33.1 26.0 - 34.0 pg   MCHC 31.2 30.0 - 36.0 g/dL   RDW 22.3 (H) 11.5 - 15.5 %   Platelets 88 (L) 150 - 400 K/uL    Comment: REPEATED TO VERIFY PLATELET COUNT CONFIRMED BY SMEAR SPECIMEN CHECKED FOR CLOTS Immature Platelet Fraction may be clinically indicated, consider ordering this additional test [id number]    nRBC 0.0 0.0 - 0.2 %   Neutrophils Relative % 86 %   Neutro Abs 2.5 1.7 - 7.7 K/uL   Lymphocytes Relative 8 %   Lymphs Abs 0.2 (L) 0.7 - 4.0 K/uL   Monocytes Relative 6 %    Monocytes Absolute 0.2 0.1 - 1.0 K/uL   Eosinophils Relative 0 %   Eosinophils Absolute 0.0 0.0 - 0.5 K/uL   Basophils Relative 0 %   Basophils Absolute 0.0 0.0 - 0.1 K/uL   Immature Granulocytes 0 %   Abs Immature Granulocytes 0.00 0.00 - 0.07 K/uL   Spherocytes PRESENT     Comment: Performed at [facility], [address] [place], [place] [address]  Comprehensive metabolic panel     Status: Abnormal   Collection Time: 12/25/18  6:45 AM  Result Value Ref Range   Sodium 140 135 - 145 mmol/L   Potassium 3.1 (L) 3.5 - 5.1 mmol/L   Chloride 111 98 - 111 mmol/L   CO2 22 22 - 32 mmol/L   Glucose, Bld 100 (H) 70 - 99 mg/dL   BUN 16 8 - 23 mg/dL   Creatinine, Ser 0.85 0.61 - 1.24 mg/dL   Calcium 7.8 (L) 8.9 - 10.3 mg/dL   Total Protein 5.1 (L) 6.5 - 8.1 g/dL   Albumin 2.8 (L) 3.5 - 5.0 g/dL   AST 16 15 - 41 U/L   ALT 18 0 - 44 U/L   Alkaline Phosphatase 44 38 - 126 U/L   Total Bilirubin  1.6 (H) 0.3 - 1.2 mg/dL   GFR calc non Af Amer >60 >60 mL/min   GFR calc Af Amer >60 >60 mL/min   Anion gap 7 5 - 15    Comment: Performed at [facility], [address], [place], [place] [address]  Glucose, capillary     Status: Abnormal   Collection Time: 12/25/18 11:04 AM  Result Value Ref Range   Glucose-Capillary 52 (L) 70 - 99 mg/dL  Glucose, capillary     Status: Abnormal   Collection Time: 12/25/18 11:41 AM  Result Value Ref Range   Glucose-Capillary 138 (H) 70 - 99 mg/dL  Comprehensive metabolic panel     Status: Abnormal   Collection Time: 12/26/18  5:58 AM  Result Value Ref Range   Sodium 141 135 - 145 mmol/L   Potassium 3.1 (L) 3.5 - 5.1 mmol/L   Chloride 109 98 - 111 mmol/L   CO2 25 22 - 32 mmol/L   Glucose, Bld 99 70 - 99 mg/dL   BUN 14 8 - 23 mg/dL   Creatinine, Ser 0.98 0.61 - 1.24 mg/dL   Calcium 7.9 (L) 8.9 - 10.3 mg/dL   Total Protein 5.3 (L) 6.5 - 8.1 g/dL   Albumin 2.9 (L) 3.5 - 5.0 g/dL   AST 19 15 - 41 U/L   ALT 20 0 - 44 U/L   Alkaline Phosphatase 47 38 - 126  U/L   Total Bilirubin 1.9 (H) 0.3 - 1.2 mg/dL   GFR calc non Af Amer >60 >60 mL/min   GFR calc Af Amer >60 >60 mL/min   Anion gap 7 5 - 15    Comment: Performed at [facility], [address] [place], [place] [address]    ABGS No results for input(s): PHART, PO2ART, TCO2, HCO3 in the last 72 hours.  Invalid input(s): PCO2 CULTURES Recent Results (from the past 240 hour(s))  SARS CORONAVIRUS 2 (TAT 6-24 HRS) Nasopharyngeal Nasopharyngeal Swab     Status: None   Collection Time: 12/21/18 10:56 AM   Specimen: Nasopharyngeal Swab  Result Value Ref Range Status   SARS Coronavirus 2 NEGATIVE NEGATIVE Final    Comment: (NOTE) SARS-CoV-2 target nucleic acids are NOT DETECTED. The SARS-CoV-2 RNA is generally detectable in upper and lower respiratory specimens during the acute phase of infection. Negative results do not preclude SARS-CoV-2 infection, do not rule out co-infections with other pathogens, and should not be used as the sole basis for treatment or other patient management decisions. Negative results must be combined with clinical observations, patient history, and epidemiological information. The expected result is Negative. Fact Sheet for Patients: SugarRoll.be Fact Sheet for Healthcare Providers: https://www.woods-mathews.com/ This test is not yet approved or cleared by the [place] FDA and  has been authorized for detection and/or diagnosis of SARS-CoV-2 by FDA under an Emergency Use Authorization (EUA). This EUA will remain  in effect (meaning this test can be used) for the duration of the COVID-19 declaration under Section 56 4(b)(1) of the Act, 21 U.S.C. section 360bbb-3(b)(1), unless the authorization is terminated or revoked sooner. Performed at [facility], [place] [address], [place], [place] [address]    Studies/Results: No results found.  Medications:  Prior to Admission:  Medications Prior to Admission   Medication Sig Dispense Refill Last Dose  . acetaminophen (TYLENOL) 500 MG tablet Take 500 mg by mouth every 6 (six) hours as needed for pain.     [name] aspirin EC 81 MG tablet Take 81 mg  by mouth daily.     [name] CARBOPLATIN IV Inject into the vein once a week.     . cholecalciferol (VITAMIN D) 1000 units tablet Take 1,000 Units by mouth 2 (two) times daily.     . clopidogrel (PLAVIX) 75 MG tablet Take 75 mg by mouth daily.     . dorzolamide-timolol (COSOPT) 22.3-6.8 MG/ML ophthalmic solution Place 1 drop into the left eye 2 (two) times daily.     . ferrous sulfate 325 (65 FE) MG tablet Take 325 mg by mouth daily.      . hydrochlorothiazide (HYDRODIURIL) 25 MG tablet TAKE ONE (1) TABLET BY MOUTH EVERY DAY (Patient taking differently: Take 25 mg by mouth daily. ) 90 tablet 3   . lidocaine-prilocaine (EMLA) cream Apply a dime-sized amount to port a cath site and cover with plastic wrap one hour prior to chemotherapy appointments 30 g 0   . losartan (COZAAR) 25 MG tablet TAKE ONE TABLET BY MOUTY EVERY DAY (Patient taking differently: Take 25 mg by mouth daily. ) 90 tablet 1   . lovastatin (MEVACOR) 10 MG tablet TAKE ONE (1) TABLET BY MOUTH EVERY DAY 90 tablet 3   . metoprolol succinate (TOPROL XL) 25 MG 24 hr tablet Take 1 tablet (25 mg total) by mouth daily. 90 tablet 3   . ondansetron (ZOFRAN ODT) 8 MG disintegrating tablet Take 1 tablet (8 mg total) by mouth 2 (two) times daily as needed for nausea or vomiting. (Patient not taking: Reported on 11/28/2018) 60 tablet 3   . PACLITAXEL IV Inject into the vein once a week.     . pantoprazole (PROTONIX) 40 MG tablet Take 40 mg by mouth daily.     . prochlorperazine (COMPAZINE) 10 MG tablet Take 1 tablet (10 mg total) by mouth every 6 (six) hours as needed for nausea or vomiting. (Patient not taking: Reported on 11/28/2018) 30 tablet 1   . Propylene Glycol (SYSTANE BALANCE OP) Place 1 drop into both eyes 2 (two) times daily as needed (dry eyes).      [name]  scopolamine (TRANSDERM-SCOP) 1 MG/3DAYS Place 1 patch (1.5 mg total) onto the skin every 3 (three) days. (Patient not taking: Reported on 11/28/2018) 10 patch 12   . tamsulosin (FLOMAX) 0.4 MG CAPS capsule Take 0.4 mg by mouth daily.     [name] triamcinolone cream (KENALOG) 0.1 % Apply 1 application topically daily as needed (itching).      . vitamin B-12 (CYANOCOBALAMIN) 1000 MCG tablet Take 1,000 mcg by mouth daily.      Scheduled: . sodium chloride   Intravenous Once  . aspirin EC  81 mg Oral Daily  . Chlorhexidine Gluconate Cloth  6 each Topical Daily  . cholecalciferol  1,000 Units Oral BID  . dorzolamide-timolol  1 drop Left Eye BID  . feeding supplement  1 Container Oral TID BM  . furosemide  40 mg Intravenous Daily  . magic mouthwash w/lidocaine  10 mL Oral TID  . metoprolol succinate  50 mg Oral Daily  . pantoprazole  40 mg Oral QAC breakfast  . potassium chloride  40 mEq Oral BID  . sucralfate  1 g Oral TID WC & HS  . tamsulosin  0.4 mg Oral Daily  . vitamin B-12  1,000 mcg Oral Daily   Continuous:  [id number], heparin lock flush, lidocaine, ondansetron **OR** ondansetron (ZOFRAN) IV, oxyCODONE-acetaminophen, [COMPLETED] polyethylene glycol **FOLLOWED BY** polyethylene glycol  Assesment: He was admitted with odynophagia and that is related to radiation-induced  esophagitis.  He is slowly improving.  He had radiation treatments because of squamous cell carcinoma of the lung and he had chemotherapy as well  He was very dehydrated on admission and had acute kidney injury from that which has resolved  He has been hypokalemic and that is being replaced  He has pancytopenia related to chemotherapy and radiation  He had penile edema which is much improved Active Problems:   Squamous cell carcinoma of lung (HCC)   Weakness   Odynophagia   AKI (acute kidney injury) ([place])   Radiation-induced esophagitis   Dehydration   Hypokalemia    Plan: Continue potassium  replacement.  He is going to get another dose of oral Lasix today.  To skilled care facility if beds available    LOS: 13 days   Nicholas Sanchez 12/26/2018, 8:24 AM

## 2018-12-26 NOTE — Care Management Important Message (Signed)
Important Message  Patient Details  Name: Nicholas Sanchez MRN: [id number] Date of Birth: November 05, 1933   Medicare Important Message Given:  Yes(given to RN to deliver due to contact precautions)     Tommy Medal 12/26/2018, 1:37 PM

## 2018-12-26 NOTE — TOC Transition Note (Addendum)
Transition of Care [facility]) - CM/SW Discharge Note   Patient Details  Name: Nicholas Sanchez MRN: [id number] Date of Birth: 1933-10-20  Transition of Care [facility]) CM/SW Contact:  Boneta Lucks, RN Phone Number: 12/26/2018, 1:05 PM   Clinical Narrative:   Patient transferring to [facility] today. Call EMS for 2:30 transport as requested by Debbie from [facility].  Called patient wife to give her the update.  She states her daughter will call [facility] and take his belongings.  She has not seen her husband in since admission, due to [place] restrictions and she can not walk in alone. Last COVID test on 10/21, confirmed with Debbie another on is not needed, he will be in isolation for 14 days and they will test.    Final next level of care: Skilled Nursing Facility Barriers to Discharge: Barriers Resolved   Patient Goals and CMS Choice Patient states their goals for this hospitalization and ongoing recovery are:: return home after rehab.      Discharge Placement              Patient chooses bed at: Other - please specify in the comment section below:([facility]) Patient to be transferred to facility by: EMS Name of family member notified: wife Patient and family notified of of transfer: 12/26/18  Discharge Plan and Services   Discharge Planning Services: CM Consult               Readmission Risk Interventions Readmission Risk Prevention Plan 12/21/2018 12/13/2018  Transportation Screening - Complete  PCP or Specialist Appt within 3-5 Days Not Complete -  Not Complete comments Pt discharging to SNF -  [place] or Home Care Consult Not Complete -  [place] or Home Care Consult comments Pt discharging to SNF -  Social Work Consult for [place] Planning/Counseling - Complete  Palliative Care Screening Not Applicable -  Medication Review Press photographer) - Complete  Some recent data might be hidden

## 2018-12-27 DIAGNOSIS — K208 Other esophagitis without bleeding: Secondary | ICD-10-CM | POA: Diagnosis not present

## 2018-12-27 DIAGNOSIS — R52 Pain, unspecified: Secondary | ICD-10-CM | POA: Diagnosis not present

## 2018-12-27 DIAGNOSIS — C3490 Malignant neoplasm of unspecified part of unspecified bronchus or lung: Secondary | ICD-10-CM | POA: Diagnosis not present

## 2018-12-27 DIAGNOSIS — T66XXXA Radiation sickness, unspecified, initial encounter: Secondary | ICD-10-CM | POA: Diagnosis not present

## 2018-12-28 DIAGNOSIS — M6281 Muscle weakness (generalized): Secondary | ICD-10-CM | POA: Diagnosis not present

## 2018-12-28 DIAGNOSIS — L98411 Non-pressure chronic ulcer of buttock limited to breakdown of skin: Secondary | ICD-10-CM | POA: Diagnosis not present

## 2018-12-28 DIAGNOSIS — Z7409 Other reduced mobility: Secondary | ICD-10-CM | POA: Diagnosis not present

## 2018-12-30 ENCOUNTER — Ambulatory Visit (HOSPITAL_COMMUNITY): Payer: Medicare Other

## 2019-01-02 ENCOUNTER — Inpatient Hospital Stay (HOSPITAL_COMMUNITY): Payer: Medicare Other

## 2019-01-02 ENCOUNTER — Other Ambulatory Visit: Payer: Self-pay

## 2019-01-02 ENCOUNTER — Inpatient Hospital Stay (HOSPITAL_BASED_OUTPATIENT_CLINIC_OR_DEPARTMENT_OTHER): Payer: Medicare Other | Admitting: Hematology

## 2019-01-02 ENCOUNTER — Inpatient Hospital Stay (HOSPITAL_COMMUNITY): Payer: Medicare Other | Attending: Hematology

## 2019-01-02 VITALS — BP 155/70 | HR 70 | Temp 97.1°F | Resp 18 | Wt 190.8 lb

## 2019-01-02 DIAGNOSIS — Z7902 Long term (current) use of antithrombotics/antiplatelets: Secondary | ICD-10-CM | POA: Insufficient documentation

## 2019-01-02 DIAGNOSIS — Z923 Personal history of irradiation: Secondary | ICD-10-CM | POA: Insufficient documentation

## 2019-01-02 DIAGNOSIS — C3490 Malignant neoplasm of unspecified part of unspecified bronchus or lung: Secondary | ICD-10-CM | POA: Diagnosis not present

## 2019-01-02 DIAGNOSIS — C3432 Malignant neoplasm of lower lobe, left bronchus or lung: Secondary | ICD-10-CM | POA: Diagnosis not present

## 2019-01-02 DIAGNOSIS — I480 Paroxysmal atrial fibrillation: Secondary | ICD-10-CM | POA: Diagnosis not present

## 2019-01-02 DIAGNOSIS — C3492 Malignant neoplasm of unspecified part of left bronchus or lung: Secondary | ICD-10-CM

## 2019-01-02 DIAGNOSIS — E860 Dehydration: Secondary | ICD-10-CM

## 2019-01-02 DIAGNOSIS — Z79899 Other long term (current) drug therapy: Secondary | ICD-10-CM | POA: Insufficient documentation

## 2019-01-02 DIAGNOSIS — Z5111 Encounter for antineoplastic chemotherapy: Secondary | ICD-10-CM | POA: Diagnosis present

## 2019-01-02 LAB — CBC WITH DIFFERENTIAL/PLATELET
Abs Immature Granulocytes: 0 10*3/uL (ref 0.00–0.07)
Basophils Absolute: 0 10*3/uL (ref 0.0–0.1)
Basophils Relative: 0 %
Eosinophils Absolute: 0 10*3/uL (ref 0.0–0.5)
Eosinophils Relative: 1 %
HCT: 27.7 % — ABNORMAL LOW (ref 39.0–52.0)
Hemoglobin: 8.6 g/dL — ABNORMAL LOW (ref 13.0–17.0)
Immature Granulocytes: 0 %
Lymphocytes Relative: 12 %
Lymphs Abs: 0.4 10*3/uL — ABNORMAL LOW (ref 0.7–4.0)
MCH: 33.3 pg (ref 26.0–34.0)
MCHC: 31 g/dL (ref 30.0–36.0)
MCV: 107.4 fL — ABNORMAL HIGH (ref 80.0–100.0)
Monocytes Absolute: 0.4 10*3/uL (ref 0.1–1.0)
Monocytes Relative: 13 %
Neutro Abs: 2.4 10*3/uL (ref 1.7–7.7)
Neutrophils Relative %: 74 %
Platelets: 145 10*3/uL — ABNORMAL LOW (ref 150–400)
RBC: 2.58 MIL/uL — ABNORMAL LOW (ref 4.22–5.81)
RDW: 24.1 % — ABNORMAL HIGH (ref 11.5–15.5)
WBC: 3.3 10*3/uL — ABNORMAL LOW (ref 4.0–10.5)
nRBC: 0 % (ref 0.0–0.2)

## 2019-01-02 LAB — COMPREHENSIVE METABOLIC PANEL
ALT: 30 U/L (ref 0–44)
AST: 23 U/L (ref 15–41)
Albumin: 3.1 g/dL — ABNORMAL LOW (ref 3.5–5.0)
Alkaline Phosphatase: 54 U/L (ref 38–126)
Anion gap: 13 (ref 5–15)
BUN: 14 mg/dL (ref 8–23)
CO2: 24 mmol/L (ref 22–32)
Calcium: 8.3 mg/dL — ABNORMAL LOW (ref 8.9–10.3)
Chloride: 100 mmol/L (ref 98–111)
Creatinine, Ser: 0.88 mg/dL (ref 0.61–1.24)
GFR calc Af Amer: >60 mL/min (ref >60)
GFR calc non Af Amer: >60 mL/min (ref >60)
Glucose, Bld: 89 mg/dL (ref 70–99)
Potassium: 3.2 mmol/L — ABNORMAL LOW (ref 3.5–5.1)
Sodium: 137 mmol/L (ref 135–145)
Total Bilirubin: 3.3 mg/dL — ABNORMAL HIGH (ref 0.3–1.2)
Total Protein: 5.7 g/dL — ABNORMAL LOW (ref 6.5–8.1)

## 2019-01-02 MED ORDER — HEPARIN SOD (PORK) LOCK FLUSH 100 UNIT/ML IV SOLN
500.0000 [IU] | Freq: Once | INTRAVENOUS | Status: AC
  Administered 2019-01-02: 500 [IU] via INTRAVENOUS

## 2019-01-02 MED ORDER — SODIUM CHLORIDE 0.9% FLUSH
10.0000 mL | Freq: Once | INTRAVENOUS | Status: AC
  Administered 2019-01-02: 10 mL

## 2019-01-02 MED ORDER — SODIUM CHLORIDE 0.9 % IV SOLN
Freq: Once | INTRAVENOUS | Status: AC
  Administered 2019-01-02: 10:00:00 via INTRAVENOUS
  Filled 2019-01-02: qty 1000

## 2019-01-02 MED ORDER — SODIUM CHLORIDE 0.9 % IV SOLN: Freq: Once | INTRAVENOUS | Status: DC

## 2019-01-02 NOTE — Patient Instructions (Signed)
[facility] at [facility]  Discharge Instructions:   _______________________________________________________________  Thank you for choosing [facility] at [facility] to provide your oncology and hematology care.  To afford each patient quality time with our providers, please arrive at least 15 minutes before your scheduled appointment.  You need to re-schedule your appointment if you arrive 10 or more minutes late.  We strive to give you quality time with our providers, and arriving late affects you and other patients whose appointments are after yours.  Also, if you no show three or more times for appointments you may be dismissed from the clinic.  Again, thank you for choosing [facility] at [facility]. Our hope is that these requests will allow you access to exceptional care and in a timely manner. _______________________________________________________________  If you have questions after your visit, please contact our office at [phone] between the hours of 8:30 a.m. and 5:00 p.m. Voicemails left after 4:30 p.m. will not be returned until the following business day. _______________________________________________________________  For prescription refill requests, have your pharmacy contact our office. _______________________________________________________________  Recommendations made by the consultant and any test results will be sent to your referring physician. _______________________________________________________________ 

## 2019-01-02 NOTE — Assessment & Plan Note (Addendum)
1.  [name] squamous cell carcinoma left lung: -PET scan on 10/17/2018 showed hypermetabolic left lower lobe nodule, left hilar and contralateral right lower paratracheal lymph node along with right hilar lymph nodes.  Hypermetabolic subcarinal lymph nodes present.  Hypermetabolic lymph node in the porta hepatis with SUV 4.9, measuring 1.1 cm, favoring reactive adenopathy. -26 pound weight loss in the last 1 year, ex-smoker quit in 1975. -Left lower lobe lung biopsy on 10/31/2018 consistent with squamous cell carcinoma. - Weekly carbotaxol started on 11/21/2018 and radiation on 11/22/2018. - Carboplatin dose reduced to 1.5 AUC on 11/28/2018. Paclitaxel 45mg /m2.  - He completed radiation therapy.  -Labs today are stable.  Patient clinically does not look well.  Will administer fluids today.  We will hold treatment today and reassess him in 1 week.   2.  Paroxysmal atrial fibrillation: - He was hospitalized for hemoptysis from 11/16/2026 through 11/19/2018. -We are holding Xarelto at this time.  No further hemoptysis noted.  3.  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  He had a pacemaker in November 2018. - Plavix is on hold since hemoptysis.  He will continue aspirin 81 mg daily.  4.  Weight loss: -Weight is stable.  He is drinking Ensure 3 cans/day. -We will closely watch his weight.

## 2019-01-02 NOTE — Progress Notes (Signed)
Nicholas Sanchez, [place] [address]   CLINIC:  Medical Oncology/Hematology  PCP:  Sinda Du, MD [place] [address] [phone]   REASON FOR VISIT:  Follow-up for Stage III Lung Cancer   CURRENT THERAPY: Chemoradiation   BRIEF ONCOLOGIC HISTORY:  Oncology History  Squamous cell carcinoma of lung ([place])  11/09/2018 Initial Diagnosis   Squamous cell carcinoma of lung, left ([place])   11/21/2018 -  Chemotherapy   The patient had palonosetron (ALOXI) injection 0.25 mg, 0.25 mg, Intravenous,  Once, 3 of 5 cycles Administration: 0.25 mg (11/21/2018), 0.25 mg (11/28/2018), 0.25 mg (12/05/2018) CARBOplatin (PARAPLATIN) 190 mg in sodium chloride 0.9 % 250 mL chemo infusion, 190 mg (100 % of original dose 189.4 mg), Intravenous,  Once, 3 of 5 cycles Dose modification:   (original dose 189.4 mg, Cycle 1),   (original dose 193.6 mg, Cycle 2),   (original dose 145.2 mg, Cycle 3) Administration: 190 mg (11/21/2018), 150 mg (11/28/2018), 150 mg (12/05/2018) PACLitaxel (TAXOL) 96 mg in sodium chloride 0.9 % 250 mL chemo infusion (</= 80mg /m2), 45 mg/m2 = 96 mg, Intravenous,  Once, 3 of 5 cycles Administration: 96 mg (11/21/2018), 96 mg (11/28/2018), 96 mg (12/05/2018)  for chemotherapy treatment.       CANCER STAGING: Cancer Staging Squamous cell carcinoma of lung (HCC) Staging form: Lung, AJCC 8th Edition - Clinical stage from 11/09/2018: Stage IIIB (cT1c, cN3, cM0) - Unsigned    INTERVAL HISTORY:  Nicholas Sanchez 85 y.o. male presents today for follow up. He was recently admitted to the hospital for decreased PO intake, odynophagia, and hypotension. Once stable, He was discharged to skilled nursing facility. He states he is feeling somewhat better. He states he is still experiencing odynophagia and has poor PO intake. He has not lost any additional weight. He denies any s/s of infection. He states he does not wish to proceed with treatment today.      REVIEW OF SYSTEMS:  Review of Systems  Constitutional: Positive for fatigue.  HENT:  Negative.   Eyes: Negative.   Respiratory: Negative.   Cardiovascular: Negative.   Gastrointestinal: Negative.   Endocrine: Negative.   Genitourinary: Negative.    Musculoskeletal: Positive for arthralgias, gait problem and myalgias.  Skin: Negative.   Neurological: Positive for extremity weakness and gait problem.  Hematological: Negative.   Psychiatric/Behavioral: Negative.      PAST MEDICAL/SURGICAL HISTORY:  Past Medical History:  Diagnosis Date   CAD (coronary artery disease)    a. STEMI 04/2008 s/p BMS to prox & distal RCA, staged DES to LAD same admission. b. Nuc 03/2017: scar but no ischemia, EF 45-54%. c. 09/2017: NSTEMI - DES x 2 to proximal and mid-RCA   Cancer (HCC)    CKD (chronic kidney disease), stage II    Cough, persistent    GERD (gastroesophageal reflux disease)    History of BPH    HTN (hypertension)    Hypercholesteremia    LV dysfunction    a. EF 45-50% in 01/2016.   Mild pulmonary hypertension (HCC)    PAF (paroxysmal atrial fibrillation) ([place])    Port-A-Cath in place 11/21/2018   Presence of permanent cardiac pacemaker    Sinus node dysfunction ([place])    a. s/p StJude CRT-pacemaker 01/2016.   Past Surgical History:  Procedure Laterality Date   BIOPSY  12/15/2018   Procedure: BIOPSY;  Surgeon: [name], MD;  [facility];  Service: Endoscopy;;  esophageal  CARDIAC CATHETERIZATION     COLONOSCOPY     COLONOSCOPY N/A 08/27/2017   Dr. [name]: 25 5-25 mm polyps in descending colon, ascending, cecum. 2X3 cm carpet polyp in base of cecum lifted away s/p piecemeal polypectomy. Tubulovillous adenoma and tubular adenomas   CORONARY STENT INTERVENTION N/A 10/22/2017   Procedure: CORONARY STENT INTERVENTION;  Surgeon: [name], MD;  Location: [facility];  Service: Cardiovascular;  Laterality: N/A;   CORONARY STENT  PLACEMENT  04/2008   RCA and LAD   EP IMPLANTABLE DEVICE N/A 01/09/2016   Procedure: BiV Pacemaker Insertion CRT-P;  Surgeon: [name], MD;  Location: [facility];  Service: Cardiovascular;  Laterality: N/A;   ESOPHAGOGASTRODUODENOSCOPY N/A 08/27/2017   normal esophagus s/p dilation, gastric petechia, small hiatal hernia, normal duodenum   ESOPHAGOGASTRODUODENOSCOPY (EGD) WITH PROPOFOL N/A 12/15/2018   Procedure: ESOPHAGOGASTRODUODENOSCOPY (EGD) WITH PROPOFOL;  Surgeon: [name], MD;  [facility];  Service: Endoscopy;  Laterality: N/A;   INSERT / REPLACE / [place] N/A 08/27/2017   Procedure: MALONEY DILATION;  Surgeon: [name], MD;  [facility];  Service: Endoscopy;  Laterality: N/A;   PORTACATH PLACEMENT Right 11/18/2018   Procedure: INSERTION PORT-A-CATH;  Surgeon: [name], MD;  Location: [facility];  Service: General;  Laterality: Right;   RIGHT/LEFT HEART CATH AND CORONARY ANGIOGRAPHY N/A 10/21/2017   Procedure: RIGHT/LEFT HEART CATH AND CORONARY ANGIOGRAPHY;  Surgeon: [name], MD;  Location: [facility];  Service: Cardiovascular;  Laterality: N/A;     SOCIAL HISTORY:  Social History   Socioeconomic History   Marital status: Married    Spouse name: Nicholas Sanchez   Number of children: 3   Years of education: Not on file   Highest education level: Not on file  Occupational History   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   Food insecurity    Worry: Never true    Inability: Never true   Transportation needs    Medical: No    Non-medical: No  Tobacco Use   Smoking status: Former Smoker    Packs/day: 2.50    Years: 25.00    Pack years: 62.50    Types: Cigarettes    Quit date: 06/14/1973    Years since quitting: 45.5   Smokeless tobacco: Former Systems developer    Types: [place] date: 06/14/1973   Tobacco comment:  some/chewed very little for about 2 years  Substance and Sexual Activity   Alcohol use: No   Drug use: No   Sexual activity: Not on file  Lifestyle   Physical activity    Days per week: 0 days    Minutes per session: 0 min   Stress: Not at all  Relationships   Social connections    Talks on phone: More than three times a week    Gets together: More than three times a week    Attends religious service: More than 4 times per year    Active member of club or organization: No    Attends meetings of clubs or organizations: Never    Relationship status: Married   Intimate partner violence    Fear of current or ex partner: No    Emotionally abused: No    Physically abused: No    Forced sexual activity: No  Other Topics Concern   Not  on file  Social History Narrative   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     FAMILY HISTORY:  Family History  Problem Relation Age of Onset   Emphysema Mother        mother died with emphysema and cancer   Cancer Mother    Prostate cancer Brother    Heart disease Brother    Hypothyroidism Daughter    Hypothyroidism Daughter    Colon cancer Neg Hx    Colon polyps Neg Hx     CURRENT MEDICATIONS:  Outpatient Encounter Medications as of 01/02/2019  Medication Sig Note   aspirin EC 81 MG tablet Take 81 mg by mouth daily.    cholecalciferol (VITAMIN D) 1000 units tablet Take 1,000 Units by mouth 2 (two) times daily.    dorzolamide-timolol (COSOPT) 22.3-6.8 MG/ML ophthalmic solution Place 1 drop into the left eye 2 (two) times daily.    lidocaine (XYLOCAINE) 2 % solution Use as directed 15 mLs in the mouth or throat every 3 (three) hours as needed for mouth pain.    lidocaine-prilocaine (EMLA) cream Apply a dime-sized amount to port a cath site and cover with plastic wrap one hour prior to chemotherapy appointments 12/12/2018: Last filled 11/15/18   magic mouthwash w/lidocaine SOLN Take 10 mLs by mouth 3 (three) times  daily.    metoprolol succinate (TOPROL-XL) 50 MG 24 hr tablet Take 1 tablet (50 mg total) by mouth daily. Take with or immediately following a meal.    pantoprazole (PROTONIX) 40 MG tablet Take 1 tablet (40 mg total) by mouth daily before breakfast.    potassium chloride SA (KLOR-CON) 20 MEQ tablet Take 2 tablets (40 mEq total) by mouth 2 (two) times daily. For 2 days    sucralfate (CARAFATE) 1 GM/10ML suspension Take 10 mLs (1 g total) by mouth 4 (four) times daily -  with meals and at bedtime.    tamsulosin (FLOMAX) 0.4 MG CAPS capsule Take 0.4 mg by mouth daily.    vitamin B-12 (CYANOCOBALAMIN) 1000 MCG tablet Take 1,000 mcg by mouth daily.    acetaminophen (TYLENOL) 500 MG tablet Take 500 mg by mouth every 6 (six) hours as needed for pain.    ondansetron (ZOFRAN ODT) 8 MG disintegrating tablet Take 1 tablet (8 mg total) by mouth 2 (two) times daily as needed for nausea or vomiting. (Patient not taking: Reported on 11/28/2018) 12/12/2018: Last filled 11/22/18   oxyCODONE-acetaminophen (PERCOCET/ROXICET) 5-325 MG tablet Take 1 tablet by mouth every 8 (eight) hours as needed for moderate pain. (Patient not taking: Reported on 01/02/2019)    polyethylene glycol (MIRALAX / GLYCOLAX) 17 g packet Take 17 g by mouth daily as needed for mild constipation. (Patient not taking: Reported on 01/02/2019)    prochlorperazine (COMPAZINE) 10 MG tablet Take 1 tablet (10 mg total) by mouth every 6 (six) hours as needed for nausea or vomiting. (Patient not taking: Reported on 11/28/2018) 12/12/2018: Last filled 11/15/18   No facility-administered encounter medications on file as of 01/02/2019.     ALLERGIES:  Allergies  Allergen Reactions   Ace Inhibitors Nausea Only   Tape Rash    And Bandaids, too     PHYSICAL EXAM:  ECOG Performance status: 2  There were no vitals filed for this visit. There were no vitals filed for this visit.  Physical Exam Constitutional:      Appearance: He is  ill-appearing.  HENT:     Head: Normocephalic.     Right Ear: External  ear normal.     Left Ear: External ear normal.     Nose: Nose normal.     Mouth/Throat:     Pharynx: Oropharynx is clear.  Eyes:     Conjunctiva/sclera: Conjunctivae normal.  Neck:     Musculoskeletal: Normal range of motion.  Cardiovascular:     Rate and Rhythm: Normal rate and regular rhythm.     Pulses: Normal pulses.     Heart sounds: Normal heart sounds.  Pulmonary:     Effort: Pulmonary effort is normal.     Breath sounds: Normal breath sounds.  Abdominal:     General: Bowel sounds are normal.  Musculoskeletal:     Comments: Decreased ROM   Skin:    General: Skin is warm.  Neurological:     General: No focal deficit present.     Mental Status: He is alert and oriented to person, place, and time.  Psychiatric:        Mood and Affect: Mood normal.        Behavior: Behavior normal.        Thought Content: Thought content normal.        Judgment: Judgment normal.      LABORATORY DATA:  I have reviewed the labs as listed.  CBC    Component Value Date/Time   WBC 3.3 (L) 01/02/2019 0839   RBC 2.58 (L) 01/02/2019 0839   HGB 8.6 (L) 01/02/2019 0839   HCT 27.7 (L) 01/02/2019 0839   PLT 145 (L) 01/02/2019 0839   MCV 107.4 (H) 01/02/2019 0839   MCH 33.3 01/02/2019 0839   MCHC 31.0 01/02/2019 0839   RDW 24.1 (H) 01/02/2019 0839   LYMPHSABS 0.4 (L) 01/02/2019 0839   MONOABS 0.4 01/02/2019 0839   EOSABS 0.0 01/02/2019 0839   BASOSABS 0.0 01/02/2019 0839   CMP Latest Ref Rng & Units 01/02/2019 12/26/2018 12/25/2018  Glucose 70 - 99 mg/dL 89 99 100(H)  BUN 8 - 23 mg/dL 14 14 16   Creatinine 0.61 - 1.24 mg/dL 0.88 0.98 0.85  Sodium 135 - 145 mmol/L 137 141 140  Potassium 3.5 - 5.1 mmol/L 3.2(L) 3.1(L) 3.1(L)  Chloride 98 - 111 mmol/L 100 109 111  CO2 22 - 32 mmol/L 24 25 22   Calcium 8.9 - 10.3 mg/dL 8.3(L) 7.9(L) 7.8(L)  Total Protein 6.5 - 8.1 g/dL 5.7(L) 5.3(L) 5.1(L)  Total Bilirubin 0.3 -  1.2 mg/dL 3.3(H) 1.9(H) 1.6(H)  Alkaline Phos 38 - 126 U/L 54 47 44  AST 15 - 41 U/L 23 19 16   ALT 0 - 44 U/L 30 20 18         ASSESSMENT & PLAN:   Squamous cell carcinoma of lung (HCC) 1.  [name] squamous cell carcinoma left lung: -PET scan on 10/17/2018 showed hypermetabolic left lower lobe nodule, left hilar and contralateral right lower paratracheal lymph node along with right hilar lymph nodes.  Hypermetabolic subcarinal lymph nodes present.  Hypermetabolic lymph node in the porta hepatis with SUV 4.9, measuring 1.1 cm, favoring reactive adenopathy. -26 pound weight loss in the last 1 year, ex-smoker quit in 1975. -Left lower lobe lung biopsy on 10/31/2018 consistent with squamous cell carcinoma. - Weekly carbotaxol started on 11/21/2018 and radiation on 11/22/2018. - Carboplatin dose reduced to 1.5 AUC on 11/28/2018. Paclitaxel 45mg /m2.  - He completed radiation therapy.  -Labs today are stable.  Patient clinically does not look well.  Will administer fluids today.  We will hold treatment today and reassess him in 1 week.  2.  Paroxysmal atrial fibrillation: - He was hospitalized for hemoptysis from 11/16/2026 through 11/19/2018. -We are holding Xarelto at this time.  No further hemoptysis noted.  3.  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  He had a pacemaker in November 2018. - Plavix is on hold since hemoptysis.  He will continue aspirin 81 mg daily.  4.  Weight loss: -Weight is stable.  He is drinking Ensure 3 cans/day. -We will closely watch his weight.          [place] [phone]

## 2019-01-02 NOTE — Progress Notes (Signed)
Pt presents today for tx and f/u visit with RNester NP. VS within parameters for tx today. Pt was discharged from the hospital 12/26/18 to a skilled nursing faciliy/ [facility] per pt. Pt has no complaints of any pain or changes since the last visit.   VO received no tx today. Give 1 liter of hydration/ house wine over 2 hours.   IV hydration given today per MD orders. Tolerated infusion without adverse affects. Vital signs stable. No complaints at this time. Discharged from clinic via wheel chair. F/U with [facility] as scheduled.

## 2019-01-03 ENCOUNTER — Other Ambulatory Visit: Payer: Self-pay

## 2019-01-03 NOTE — Patient Outreach (Signed)
Nicholas Sanchez [facility]) Care Management  01/03/2019  Nicholas Sanchez 06/02/1933 [id number]   Medication Adherence call to Nicholas Sanchez Compliant Voice message left with a call back number. Nicholas Sanchez is showing past due on Lovastatin 10 mg and Losartan 25 mg under Nicholas Sanchez.   [facility] Direct Dial [phone]  Fax [phone] [email or web address]

## 2019-01-04 ENCOUNTER — Telehealth: Payer: Self-pay | Admitting: *Deleted

## 2019-01-04 ENCOUNTER — Ambulatory Visit (HOSPITAL_COMMUNITY): Payer: Medicare Other

## 2019-01-04 NOTE — Telephone Encounter (Signed)
Spoke with patient's wife regarding [place] monitor. Monitor has been disconnected, last alerts check on 12/10/18. Pt's wife reports pt is in a rehab facility [facility]). Explained importance of remote monitoring. She agrees to try to bring monitor to facility tomorrow when she stops by. Gave direct DC number, asked her to provide our direct number to facility so that we can assist with monitor setup. Pt's wife verbalizes understanding and agreement with plan.

## 2019-01-05 ENCOUNTER — Other Ambulatory Visit: Payer: Self-pay | Admitting: Internal Medicine

## 2019-01-05 DIAGNOSIS — T66XXXA Radiation sickness, unspecified, initial encounter: Secondary | ICD-10-CM | POA: Diagnosis not present

## 2019-01-05 DIAGNOSIS — C3490 Malignant neoplasm of unspecified part of unspecified bronchus or lung: Secondary | ICD-10-CM | POA: Diagnosis not present

## 2019-01-05 DIAGNOSIS — K208 Other esophagitis without bleeding: Secondary | ICD-10-CM | POA: Diagnosis not present

## 2019-01-05 DIAGNOSIS — I480 Paroxysmal atrial fibrillation: Secondary | ICD-10-CM | POA: Diagnosis not present

## 2019-01-05 NOTE — Telephone Encounter (Signed)
Pt wife states she dropped off his monitor at the facility.

## 2019-01-05 NOTE — Telephone Encounter (Signed)
Monitor is up to date as of 01/05/19.

## 2019-01-09 ENCOUNTER — Inpatient Hospital Stay (HOSPITAL_COMMUNITY): Payer: Medicare Other

## 2019-01-09 ENCOUNTER — Inpatient Hospital Stay (HOSPITAL_BASED_OUTPATIENT_CLINIC_OR_DEPARTMENT_OTHER): Payer: Medicare Other | Admitting: Hematology

## 2019-01-09 ENCOUNTER — Encounter (HOSPITAL_COMMUNITY): Payer: Self-pay | Admitting: Hematology

## 2019-01-09 ENCOUNTER — Other Ambulatory Visit: Payer: Self-pay

## 2019-01-09 VITALS — BP 85/69 | HR 113 | Temp 97.9°F | Resp 20 | Wt 188.5 lb

## 2019-01-09 DIAGNOSIS — C3492 Malignant neoplasm of unspecified part of left bronchus or lung: Secondary | ICD-10-CM | POA: Diagnosis not present

## 2019-01-09 DIAGNOSIS — Z7902 Long term (current) use of antithrombotics/antiplatelets: Secondary | ICD-10-CM | POA: Diagnosis not present

## 2019-01-09 DIAGNOSIS — C3490 Malignant neoplasm of unspecified part of unspecified bronchus or lung: Secondary | ICD-10-CM | POA: Diagnosis not present

## 2019-01-09 DIAGNOSIS — Z923 Personal history of irradiation: Secondary | ICD-10-CM | POA: Diagnosis not present

## 2019-01-09 DIAGNOSIS — C3432 Malignant neoplasm of lower lobe, left bronchus or lung: Secondary | ICD-10-CM | POA: Diagnosis not present

## 2019-01-09 DIAGNOSIS — I480 Paroxysmal atrial fibrillation: Secondary | ICD-10-CM | POA: Diagnosis not present

## 2019-01-09 DIAGNOSIS — Z79899 Other long term (current) drug therapy: Secondary | ICD-10-CM | POA: Diagnosis not present

## 2019-01-09 LAB — COMPREHENSIVE METABOLIC PANEL
ALT: 20 U/L (ref 0–44)
AST: 18 U/L (ref 15–41)
Albumin: 3 g/dL — ABNORMAL LOW (ref 3.5–5.0)
Alkaline Phosphatase: 60 U/L (ref 38–126)
Anion gap: 8 (ref 5–15)
BUN: 13 mg/dL (ref 8–23)
CO2: 28 mmol/L (ref 22–32)
Calcium: 8.3 mg/dL — ABNORMAL LOW (ref 8.9–10.3)
Chloride: 99 mmol/L (ref 98–111)
Creatinine, Ser: 0.96 mg/dL (ref 0.61–1.24)
GFR calc Af Amer: >60 mL/min (ref >60)
GFR calc non Af Amer: >60 mL/min (ref >60)
Glucose, Bld: 109 mg/dL — ABNORMAL HIGH (ref 70–99)
Potassium: 3.3 mmol/L — ABNORMAL LOW (ref 3.5–5.1)
Sodium: 135 mmol/L (ref 135–145)
Total Bilirubin: 2.2 mg/dL — ABNORMAL HIGH (ref 0.3–1.2)
Total Protein: 5.8 g/dL — ABNORMAL LOW (ref 6.5–8.1)

## 2019-01-09 LAB — CBC WITH DIFFERENTIAL/PLATELET
Abs Immature Granulocytes: 0.01 10*3/uL (ref 0.00–0.07)
Basophils Absolute: 0 10*3/uL (ref 0.0–0.1)
Basophils Relative: 0 %
Eosinophils Absolute: 0 10*3/uL (ref 0.0–0.5)
Eosinophils Relative: 1 %
HCT: 28.2 % — ABNORMAL LOW (ref 39.0–52.0)
Hemoglobin: 8.9 g/dL — ABNORMAL LOW (ref 13.0–17.0)
Immature Granulocytes: 0 %
Lymphocytes Relative: 10 %
Lymphs Abs: 0.4 10*3/uL — ABNORMAL LOW (ref 0.7–4.0)
MCH: 34.9 pg — ABNORMAL HIGH (ref 26.0–34.0)
MCHC: 31.6 g/dL (ref 30.0–36.0)
MCV: 110.6 fL — ABNORMAL HIGH (ref 80.0–100.0)
Monocytes Absolute: 0.4 10*3/uL (ref 0.1–1.0)
Monocytes Relative: 10 %
Neutro Abs: 3 10*3/uL (ref 1.7–7.7)
Neutrophils Relative %: 79 %
Platelets: 138 10*3/uL — ABNORMAL LOW (ref 150–400)
RBC: 2.55 MIL/uL — ABNORMAL LOW (ref 4.22–5.81)
RDW: 24 % — ABNORMAL HIGH (ref 11.5–15.5)
WBC: 3.8 10*3/uL — ABNORMAL LOW (ref 4.0–10.5)
nRBC: 0 % (ref 0.0–0.2)

## 2019-01-09 MED ORDER — HEPARIN SOD (PORK) LOCK FLUSH 100 UNIT/ML IV SOLN
500.0000 [IU] | Freq: Once | INTRAVENOUS | Status: AC
  Administered 2019-01-09: 500 [IU] via INTRAVENOUS

## 2019-01-09 MED ORDER — SODIUM CHLORIDE 0.9% FLUSH
10.0000 mL | Freq: Once | INTRAVENOUS | Status: AC
  Administered 2019-01-09: 10 mL via INTRAVENOUS

## 2019-01-09 NOTE — Progress Notes (Signed)
[place],  [address]   CLINIC:  Medical Oncology/Hematology  PCP:  Sinda Du, [place] [address] [phone]   REASON FOR VISIT:  Follow-up for lung cancer.  CURRENT THERAPY: Recommended combination chemoradiation therapy.   CANCER STAGING: Cancer Staging Squamous cell carcinoma of lung (HCC) Staging form: Lung, AJCC 8th Edition - Clinical stage from 11/09/2018: Stage IIIB (cT1c, cN3, cM0) - Unsigned    INTERVAL HISTORY:  Mr. Delapena 85 y.o. male seen for follow-up of lung cancer after recent prolonged hospitalization.  He is currently at nursing home.  His energy levels are improving.  He is swallowing is also improving but not completely normal yet.  Leg swellings have been stable.  Chronic cough and shortness of breath on exertion are also stable.    REVIEW OF SYSTEMS:  Review of Systems  Constitutional: Positive for unexpected weight change.  Respiratory: Positive for cough and shortness of breath.   Cardiovascular: Positive for leg swelling.  Neurological: Positive for headaches.  Psychiatric/Behavioral: Positive for sleep disturbance.  All other systems reviewed and are negative.    PAST MEDICAL/SURGICAL HISTORY:  Past Medical History:  Diagnosis Date  . Atherosclerotic heart disease of native coronary artery without angina pectoris   . Bradycardia, unspecified   . CAD (coronary artery disease)    a. STEMI 04/2008 s/p BMS to prox & distal RCA, staged DES to LAD same admission. b. Nuc 03/2017: scar but no ischemia, EF 45-54%. c. 09/2017: NSTEMI - DES x 2 to proximal and mid-RCA  . Cancer ([place])   . Chronic anticoagulation   . Chronic atrial fibrillation ([place])   . Chronic systolic (congestive) heart failure ([place])   . CKD (chronic kidney disease), stage II   . Cough, persistent   . GERD (gastroesophageal reflux disease)   . History of BPH   . HTN (hypertension)   .  Hypercholesteremia   . LV dysfunction    a. EF 45-50% in 01/2016.  . Malignant neoplasm of unspecified part of unspecified bronchus or lung ([place])   . Mild pulmonary hypertension ([place])   . Obesity, unspecified   . Other esophagitis without bleeding   . PAF (paroxysmal atrial fibrillation) ([place])   . Pneumonia, unspecified organism   . Port-A-Cath in place 11/21/2018  . Presence of cardiac pacemaker   . Presence of permanent cardiac pacemaker   . Sinus node dysfunction ([place])    a. s/p StJude CRT-pacemaker 01/2016.  [name] Tinnitus, bilateral   . Unspecified glaucoma    Past Surgical History:  Procedure Laterality Date  . BIOPSY  12/15/2018   Procedure: BIOPSY;  Surgeon: [name], MD;  [facility];  Service: Endoscopy;;  esophageal  . CARDIAC CATHETERIZATION    . COLONOSCOPY    . COLONOSCOPY N/A 08/27/2017   Dr. [name]: 25 5-25 mm polyps in descending colon, ascending, cecum. 2X3 cm carpet polyp in base of cecum lifted away s/p piecemeal polypectomy. Tubulovillous adenoma and tubular adenomas  . CORONARY STENT INTERVENTION N/A 10/22/2017   Procedure: CORONARY STENT INTERVENTION;  Surgeon: [name], MD;  Location: [facility];  Service: Cardiovascular;  Laterality: N/A;  . CORONARY STENT PLACEMENT  04/2008   RCA and LAD  . EP IMPLANTABLE DEVICE N/A 01/09/2016   Procedure: BiV Pacemaker Insertion CRT-P;  Surgeon: [name], MD;  Location: [facility];  Service: Cardiovascular;  Laterality: N/A;  . ESOPHAGOGASTRODUODENOSCOPY N/A 08/27/2017   normal  esophagus s/p dilation, gastric petechia, small hiatal hernia, normal duodenum  . ESOPHAGOGASTRODUODENOSCOPY (EGD) WITH PROPOFOL N/A 12/15/2018   Procedure: ESOPHAGOGASTRODUODENOSCOPY (EGD) WITH PROPOFOL;  Surgeon: [name], MD;  [facility];  Service: Endoscopy;  Laterality: N/A;  . INSERT / REPLACE / REMOVE PACEMAKER    . MALONEY DILATION N/A 08/27/2017   Procedure: [name] DILATION;   Surgeon: [name], MD;  [facility];  Service: Endoscopy;  Laterality: N/A;  . PORTACATH PLACEMENT Right 11/18/2018   Procedure: INSERTION PORT-A-CATH;  Surgeon: [name], MD;  Location: [facility];  Service: General;  Laterality: Right;  . RIGHT/LEFT HEART CATH AND CORONARY ANGIOGRAPHY N/A 10/21/2017   Procedure: RIGHT/LEFT HEART CATH AND CORONARY ANGIOGRAPHY;  Surgeon: [name], MD;  Location: [facility];  Service: Cardiovascular;  Laterality: N/A;     SOCIAL HISTORY:  Social History   Socioeconomic History  . Marital status: Married    Spouse name: Air traffic controller  . Number of children: 3  . Years of education: Not on file  . Highest education level: Not on file  Occupational History  . Occupation: retired    Comment: Geographical information systems officer  . Financial resource strain: Not very hard  . Food insecurity    Worry: Never true    Inability: Never true  . Transportation needs    Medical: No    Non-medical: No  Tobacco Use  . Smoking status: Former Smoker    Packs/day: 2.50    Years: 25.00    Pack years: 62.50    Types: Cigarettes    Quit date: 06/14/1973    Years since quitting: 45.6  . Smokeless tobacco: Former Systems developer    Types: Chew    Quit date: 06/14/1973  . Tobacco comment: some/chewed very little for about 2 years  Substance and Sexual Activity  . Alcohol use: No  . Drug use: No  . Sexual activity: Not on file  Lifestyle  . Physical activity    Days per week: 0 days    Minutes per session: 0 min  . Stress: Not at all  Relationships  . Social connections    Talks on phone: More than three times a week    Gets together: More than three times a week    Attends religious service: More than 4 times per year    Active member of club or organization: No    Attends meetings of clubs or organizations: Never    Relationship status: Married  . Intimate partner violence    Fear of current or ex partner: No    Emotionally abused: No    Physically  abused: No    Forced sexual activity: No  Other Topics Concern  . 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     FAMILY HISTORY:  Family History  Problem Relation Age of Onset  . Emphysema Mother        mother died with emphysema and cancer  . Cancer Mother   . Prostate cancer Brother   . Heart disease Brother   . Hypothyroidism Daughter   . Hypothyroidism Daughter   . Colon cancer Neg Hx   . Colon polyps Neg Hx     CURRENT MEDICATIONS:  Outpatient Encounter Medications as of 01/09/2019  Medication Sig Note  . aspirin EC 81 MG tablet Take 81 mg by mouth daily.   . cholecalciferol (VITAMIN D) 1000 units tablet Take  1,000 Units by mouth 2 (two) times daily.   . dorzolamide-timolol (COSOPT) 22.3-6.8 MG/ML ophthalmic solution Place 1 drop into the left eye 2 (two) times daily.   . metoprolol succinate (TOPROL-XL) 50 MG 24 hr tablet Take 1 tablet (50 mg total) by mouth daily. Take with or immediately following a meal.   . oxyCODONE-acetaminophen (PERCOCET/ROXICET) 5-325 MG tablet Take 1 tablet by mouth every 8 (eight) hours as needed for moderate pain. (Patient not taking: Reported on 01/30/2019)   . pantoprazole (PROTONIX) 40 MG tablet Take 1 tablet (40 mg total) by mouth daily before breakfast.   . potassium chloride SA (KLOR-CON) 20 MEQ tablet Take 2 tablets (40 mEq total) by mouth 2 (two) times daily. For 2 days   . sucralfate (CARAFATE) 1 GM/10ML suspension Take 10 mLs (1 g total) by mouth 4 (four) times daily -  with meals and at bedtime.   . tamsulosin (FLOMAX) 0.4 MG CAPS capsule Take 0.4 mg by mouth daily.   . vitamin B-12 (CYANOCOBALAMIN) 1000 MCG tablet Take 1,000 mcg by mouth daily.   [name] acetaminophen (TYLENOL) 500 MG tablet Take 500 mg by mouth every 6 (six) hours as needed for pain.   [name] lidocaine (XYLOCAINE) 2 % solution Use as directed 15 mLs in the mouth or throat every 3 (three) hours as needed for mouth pain. (Patient not  taking: Reported on 01/09/2019)   . lidocaine-prilocaine (EMLA) cream Apply a dime-sized amount to port a cath site and cover with plastic wrap one hour prior to chemotherapy appointments (Patient not taking: Reported on 01/09/2019) 12/12/2018: Last filled 11/15/18  . magic mouthwash w/lidocaine SOLN Take 10 mLs by mouth 3 (three) times daily. (Patient not taking: Reported on 01/09/2019)   . ondansetron (ZOFRAN ODT) 8 MG disintegrating tablet Take 1 tablet (8 mg total) by mouth 2 (two) times daily as needed for nausea or vomiting. (Patient not taking: Reported on 11/28/2018) 12/12/2018: Last filled 11/22/18  . polyethylene glycol (MIRALAX / GLYCOLAX) 17 g packet Take 17 g by mouth daily as needed for mild constipation. (Patient not taking: Reported on 01/02/2019)   . prochlorperazine (COMPAZINE) 10 MG tablet Take 1 tablet (10 mg total) by mouth every 6 (six) hours as needed for nausea or vomiting. (Patient not taking: Reported on 11/28/2018) 12/12/2018: Last filled 11/15/18   No facility-administered encounter medications on file as of 01/09/2019.     ALLERGIES:  Allergies  Allergen Reactions  . Ace Inhibitors Nausea Only  . Tape Rash    And Bandaids, too     PHYSICAL EXAM:  ECOG Performance status: 1  Vitals:   01/09/19 0903  BP: (!) 85/69  Pulse: (!) 113  Resp: 20  Temp: 97.9 F (36.6 C)  SpO2: 94%   Filed Weights   01/09/19 0903  Weight: 188 lb 8 oz (85.5 kg)    Physical Exam Vitals signs reviewed.  Constitutional:      Appearance: Normal appearance.  Cardiovascular:     Rate and Rhythm: Normal rate and regular rhythm.     Heart sounds: Normal heart sounds.  Pulmonary:     Effort: Pulmonary effort is normal.     Breath sounds: Normal breath sounds.  Abdominal:     General: There is no distension.     Palpations: Abdomen is soft. There is no mass.  Musculoskeletal:        General: No swelling.  Skin:    General: Skin is warm.  Neurological:     General: No  focal deficit  present.     Mental Status: He is alert and oriented to person, place, and time.  Psychiatric:        Mood and Affect: Mood normal.        Behavior: Behavior normal.      LABORATORY DATA:  I have reviewed the labs as listed.  CBC    Component Value Date/Time   WBC 5.0 01/30/2019 1426   RBC 2.71 (L) 01/30/2019 1426   HGB 9.6 (L) 01/30/2019 1426   HCT 31.2 (L) 01/30/2019 1426   PLT 210 01/30/2019 1426   MCV 115.1 (H) 01/30/2019 1426   MCH 35.4 (H) 01/30/2019 1426   MCHC 30.8 01/30/2019 1426   RDW 17.6 (H) 01/30/2019 1426   LYMPHSABS 0.3 (L) 01/30/2019 1426   MONOABS 0.3 01/30/2019 1426   EOSABS 0.1 01/30/2019 1426   BASOSABS 0.0 01/30/2019 1426   CMP Latest Ref Rng & Units 01/30/2019 01/09/2019 01/02/2019  Glucose 70 - 99 mg/dL 130(H) 109(H) 89  BUN 8 - 23 mg/dL 12 13 14   Creatinine 0.61 - 1.24 mg/dL 0.91 0.96 0.88  Sodium 135 - 145 mmol/L 132(L) 135 137  Potassium 3.5 - 5.1 mmol/L 3.9 3.3(L) 3.2(L)  Chloride 98 - 111 mmol/L 97(L) 99 100  CO2 22 - 32 mmol/L 27 28 24   Calcium 8.9 - 10.3 mg/dL 8.7(L) 8.3(L) 8.3(L)  Total Protein 6.5 - 8.1 g/dL 6.9 5.8(L) 5.7(L)  Total Bilirubin 0.3 - 1.2 mg/dL 0.9 2.2(H) 3.3(H)  Alkaline Phos 38 - 126 U/L 68 60 54  AST 15 - 41 U/L 16 18 23   ALT 0 - 44 U/L 14 20 30        DIAGNOSTIC IMAGING:  I have independently reviewed the scans and discussed with the patient.    ASSESSMENT & PLAN:   Squamous cell carcinoma of lung ([place]) 1.  [name] squamous cell carcinoma left lung: -PET scan on 10/17/2018 showed hypermetabolic left lower lobe nodule, left hilar and contralateral right lower paratracheal lymph node along with right hilar lymph nodes.  Hypermetabolic subcarinal lymph nodes present.  Hypermetabolic lymph node in the porta hepatis with SUV 4.9, measuring 1.1 cm, favoring reactive adenopathy. -26 pound weight loss in the last 1 year, ex-smoker quit in 1975. -Left lower lobe lung biopsy on 10/31/2018 consistent with squamous cell  carcinoma. -Weekly carboplatin and paclitaxel from 11/21/2018 through 12/05/2018. -He had a prolonged hospitalization and developed radiation esophagitis.  He went to a nursing home after that. -His energy levels are coming back. -I will plan to repeat CT scan of the chest in 2 weeks.  We will make addition of whether to restart treatment at that time.  He does not want to do any radiation.   2.  Paroxysmal atrial fibrillation: - He was hospitalized for hemoptysis from 11/16/2026 through 11/19/2018. -We are holding Xarelto at this time.  No further hemoptysis noted.  3.  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  He had a pacemaker in November 2018. - Plavix is on hold since hemoptysis.  He will continue aspirin 81 mg daily.  4.  Weight loss: -Weight is stable.  He is drinking Ensure 3 cans/day. -We will closely watch his weight.    Orders placed this encounter:  Orders Placed This Encounter  Procedures  . CT Chest W Contrast  . CBC with Differential/Platelet  . Comprehensive metabolic panel  . Vitamin B12  . Ferritin  . Folate  . Iron and TIBC  Derek Jack, MD [place] [phone]

## 2019-01-09 NOTE — Progress Notes (Signed)
No treatment today per MD.  

## 2019-01-09 NOTE — Patient Instructions (Addendum)
[place] at [facility] Discharge Instructions  You were seen today by Dr. Delton Coombes. He went over your recent lab results. He will not give you anymore treatments at this time. He will schedule you for a repeat CT of your chest to evaluate your treatment response. He will see you back in 2 weeks for labs and follow up.   Thank you for choosing [place] at [facility] to provide your oncology and hematology care.  To afford each patient quality time with our provider, please arrive at least 15 minutes before your scheduled appointment time.   If you have a lab appointment with the [facility] please come in thru the  Main Entrance and check in at the main information desk  You need to re-schedule your appointment should you arrive 10 or more minutes late.  We strive to give you quality time with our providers, and arriving late affects you and other patients whose appointments are after yours.  Also, if you no show three or more times for appointments you may be dismissed from the clinic at the providers discretion.     Again, thank you for choosing [facility].  Our hope is that these requests will decrease the amount of time that you wait before being seen by our physicians.       _____________________________________________________________  Should you have questions after your visit to [facility], please contact our office at [phone] between the hours of 8:00 a.m. and 4:30 p.m.  Voicemails left after 4:00 p.m. will not be returned until the following business day.  For prescription refill requests, have your pharmacy contact our office and allow 72 hours.    Cancer Center Support Programs:   > Cancer Support Group  2nd Tuesday of the month 1pm-2pm, Journey Room

## 2019-01-11 ENCOUNTER — Ambulatory Visit (HOSPITAL_COMMUNITY): Payer: Medicare Other

## 2019-01-11 DIAGNOSIS — L98411 Non-pressure chronic ulcer of buttock limited to breakdown of skin: Secondary | ICD-10-CM | POA: Diagnosis not present

## 2019-01-11 DIAGNOSIS — M6281 Muscle weakness (generalized): Secondary | ICD-10-CM | POA: Diagnosis not present

## 2019-01-11 DIAGNOSIS — Z7409 Other reduced mobility: Secondary | ICD-10-CM | POA: Diagnosis not present

## 2019-01-12 DIAGNOSIS — K208 Other esophagitis without bleeding: Secondary | ICD-10-CM | POA: Diagnosis not present

## 2019-01-12 DIAGNOSIS — C3490 Malignant neoplasm of unspecified part of unspecified bronchus or lung: Secondary | ICD-10-CM | POA: Diagnosis not present

## 2019-01-12 DIAGNOSIS — T66XXXA Radiation sickness, unspecified, initial encounter: Secondary | ICD-10-CM | POA: Diagnosis not present

## 2019-01-12 DIAGNOSIS — I480 Paroxysmal atrial fibrillation: Secondary | ICD-10-CM | POA: Diagnosis not present

## 2019-01-14 DIAGNOSIS — H409 Unspecified glaucoma: Secondary | ICD-10-CM | POA: Diagnosis not present

## 2019-01-14 DIAGNOSIS — E7800 Pure hypercholesterolemia, unspecified: Secondary | ICD-10-CM | POA: Diagnosis not present

## 2019-01-14 DIAGNOSIS — I480 Paroxysmal atrial fibrillation: Secondary | ICD-10-CM | POA: Diagnosis not present

## 2019-01-14 DIAGNOSIS — Z452 Encounter for adjustment and management of vascular access device: Secondary | ICD-10-CM | POA: Diagnosis not present

## 2019-01-14 DIAGNOSIS — R339 Retention of urine, unspecified: Secondary | ICD-10-CM | POA: Diagnosis not present

## 2019-01-14 DIAGNOSIS — Z9981 Dependence on supplemental oxygen: Secondary | ICD-10-CM | POA: Diagnosis not present

## 2019-01-14 DIAGNOSIS — Y842 Radiological procedure and radiotherapy as the cause of abnormal reaction of the patient, or of later complication, without mention of misadventure at the time of the procedure: Secondary | ICD-10-CM | POA: Diagnosis not present

## 2019-01-14 DIAGNOSIS — R1312 Dysphagia, oropharyngeal phase: Secondary | ICD-10-CM | POA: Diagnosis not present

## 2019-01-14 DIAGNOSIS — E876 Hypokalemia: Secondary | ICD-10-CM | POA: Diagnosis not present

## 2019-01-14 DIAGNOSIS — I2510 Atherosclerotic heart disease of native coronary artery without angina pectoris: Secondary | ICD-10-CM | POA: Diagnosis not present

## 2019-01-14 DIAGNOSIS — E559 Vitamin D deficiency, unspecified: Secondary | ICD-10-CM | POA: Diagnosis not present

## 2019-01-14 DIAGNOSIS — T66XXXD Radiation sickness, unspecified, subsequent encounter: Secondary | ICD-10-CM | POA: Diagnosis not present

## 2019-01-14 DIAGNOSIS — C3491 Malignant neoplasm of unspecified part of right bronchus or lung: Secondary | ICD-10-CM | POA: Diagnosis not present

## 2019-01-14 DIAGNOSIS — K2100 Gastro-esophageal reflux disease with esophagitis, without bleeding: Secondary | ICD-10-CM | POA: Diagnosis not present

## 2019-01-14 DIAGNOSIS — K5900 Constipation, unspecified: Secondary | ICD-10-CM | POA: Diagnosis not present

## 2019-01-14 DIAGNOSIS — E538 Deficiency of other specified B group vitamins: Secondary | ICD-10-CM | POA: Diagnosis not present

## 2019-01-14 DIAGNOSIS — N182 Chronic kidney disease, stage 2 (mild): Secondary | ICD-10-CM | POA: Diagnosis not present

## 2019-01-14 DIAGNOSIS — N179 Acute kidney failure, unspecified: Secondary | ICD-10-CM | POA: Diagnosis not present

## 2019-01-14 DIAGNOSIS — I129 Hypertensive chronic kidney disease with stage 1 through stage 4 chronic kidney disease, or unspecified chronic kidney disease: Secondary | ICD-10-CM | POA: Diagnosis not present

## 2019-01-17 ENCOUNTER — Ambulatory Visit (INDEPENDENT_AMBULATORY_CARE_PROVIDER_SITE_OTHER): Payer: Medicare Other | Admitting: *Deleted

## 2019-01-17 DIAGNOSIS — I482 Chronic atrial fibrillation, unspecified: Secondary | ICD-10-CM

## 2019-01-17 DIAGNOSIS — I5022 Chronic systolic (congestive) heart failure: Secondary | ICD-10-CM

## 2019-01-18 DIAGNOSIS — E538 Deficiency of other specified B group vitamins: Secondary | ICD-10-CM | POA: Diagnosis not present

## 2019-01-18 DIAGNOSIS — Z452 Encounter for adjustment and management of vascular access device: Secondary | ICD-10-CM | POA: Diagnosis not present

## 2019-01-18 DIAGNOSIS — E876 Hypokalemia: Secondary | ICD-10-CM | POA: Diagnosis not present

## 2019-01-18 DIAGNOSIS — N182 Chronic kidney disease, stage 2 (mild): Secondary | ICD-10-CM | POA: Diagnosis not present

## 2019-01-18 DIAGNOSIS — E7800 Pure hypercholesterolemia, unspecified: Secondary | ICD-10-CM | POA: Diagnosis not present

## 2019-01-18 DIAGNOSIS — N179 Acute kidney failure, unspecified: Secondary | ICD-10-CM | POA: Diagnosis not present

## 2019-01-18 DIAGNOSIS — K2100 Gastro-esophageal reflux disease with esophagitis, without bleeding: Secondary | ICD-10-CM | POA: Diagnosis not present

## 2019-01-18 DIAGNOSIS — K5900 Constipation, unspecified: Secondary | ICD-10-CM | POA: Diagnosis not present

## 2019-01-18 DIAGNOSIS — I2510 Atherosclerotic heart disease of native coronary artery without angina pectoris: Secondary | ICD-10-CM | POA: Diagnosis not present

## 2019-01-18 DIAGNOSIS — E559 Vitamin D deficiency, unspecified: Secondary | ICD-10-CM | POA: Diagnosis not present

## 2019-01-18 DIAGNOSIS — R1312 Dysphagia, oropharyngeal phase: Secondary | ICD-10-CM | POA: Diagnosis not present

## 2019-01-18 DIAGNOSIS — R339 Retention of urine, unspecified: Secondary | ICD-10-CM | POA: Diagnosis not present

## 2019-01-18 DIAGNOSIS — I480 Paroxysmal atrial fibrillation: Secondary | ICD-10-CM | POA: Diagnosis not present

## 2019-01-18 DIAGNOSIS — Z9981 Dependence on supplemental oxygen: Secondary | ICD-10-CM | POA: Diagnosis not present

## 2019-01-18 DIAGNOSIS — C3491 Malignant neoplasm of unspecified part of right bronchus or lung: Secondary | ICD-10-CM | POA: Diagnosis not present

## 2019-01-18 DIAGNOSIS — T66XXXD Radiation sickness, unspecified, subsequent encounter: Secondary | ICD-10-CM | POA: Diagnosis not present

## 2019-01-18 DIAGNOSIS — Y842 Radiological procedure and radiotherapy as the cause of abnormal reaction of the patient, or of later complication, without mention of misadventure at the time of the procedure: Secondary | ICD-10-CM | POA: Diagnosis not present

## 2019-01-18 DIAGNOSIS — H409 Unspecified glaucoma: Secondary | ICD-10-CM | POA: Diagnosis not present

## 2019-01-18 DIAGNOSIS — I129 Hypertensive chronic kidney disease with stage 1 through stage 4 chronic kidney disease, or unspecified chronic kidney disease: Secondary | ICD-10-CM | POA: Diagnosis not present

## 2019-01-19 DIAGNOSIS — R1312 Dysphagia, oropharyngeal phase: Secondary | ICD-10-CM | POA: Diagnosis not present

## 2019-01-19 DIAGNOSIS — Z452 Encounter for adjustment and management of vascular access device: Secondary | ICD-10-CM | POA: Diagnosis not present

## 2019-01-19 DIAGNOSIS — E876 Hypokalemia: Secondary | ICD-10-CM | POA: Diagnosis not present

## 2019-01-19 DIAGNOSIS — K5900 Constipation, unspecified: Secondary | ICD-10-CM | POA: Diagnosis not present

## 2019-01-19 DIAGNOSIS — N179 Acute kidney failure, unspecified: Secondary | ICD-10-CM | POA: Diagnosis not present

## 2019-01-19 DIAGNOSIS — T66XXXD Radiation sickness, unspecified, subsequent encounter: Secondary | ICD-10-CM | POA: Diagnosis not present

## 2019-01-19 DIAGNOSIS — Y842 Radiological procedure and radiotherapy as the cause of abnormal reaction of the patient, or of later complication, without mention of misadventure at the time of the procedure: Secondary | ICD-10-CM | POA: Diagnosis not present

## 2019-01-19 DIAGNOSIS — C3491 Malignant neoplasm of unspecified part of right bronchus or lung: Secondary | ICD-10-CM | POA: Diagnosis not present

## 2019-01-19 DIAGNOSIS — E538 Deficiency of other specified B group vitamins: Secondary | ICD-10-CM | POA: Diagnosis not present

## 2019-01-19 DIAGNOSIS — E559 Vitamin D deficiency, unspecified: Secondary | ICD-10-CM | POA: Diagnosis not present

## 2019-01-19 DIAGNOSIS — I480 Paroxysmal atrial fibrillation: Secondary | ICD-10-CM | POA: Diagnosis not present

## 2019-01-19 DIAGNOSIS — E7800 Pure hypercholesterolemia, unspecified: Secondary | ICD-10-CM | POA: Diagnosis not present

## 2019-01-19 DIAGNOSIS — Z9981 Dependence on supplemental oxygen: Secondary | ICD-10-CM | POA: Diagnosis not present

## 2019-01-19 DIAGNOSIS — K2100 Gastro-esophageal reflux disease with esophagitis, without bleeding: Secondary | ICD-10-CM | POA: Diagnosis not present

## 2019-01-19 DIAGNOSIS — N182 Chronic kidney disease, stage 2 (mild): Secondary | ICD-10-CM | POA: Diagnosis not present

## 2019-01-19 DIAGNOSIS — H409 Unspecified glaucoma: Secondary | ICD-10-CM | POA: Diagnosis not present

## 2019-01-19 DIAGNOSIS — I129 Hypertensive chronic kidney disease with stage 1 through stage 4 chronic kidney disease, or unspecified chronic kidney disease: Secondary | ICD-10-CM | POA: Diagnosis not present

## 2019-01-19 DIAGNOSIS — I2510 Atherosclerotic heart disease of native coronary artery without angina pectoris: Secondary | ICD-10-CM | POA: Diagnosis not present

## 2019-01-19 DIAGNOSIS — R339 Retention of urine, unspecified: Secondary | ICD-10-CM | POA: Diagnosis not present

## 2019-01-19 LAB — CUP PACEART REMOTE DEVICE CHECK
Battery Remaining Longevity: 76 mo
Battery Remaining Percentage: 95.5 %
Battery Voltage: 2.96 V
Brady Statistic AP VP Percent: 95 %
Brady Statistic AP VS Percent: 1 %
Brady Statistic AS VP Percent: 4.6 %
Brady Statistic AS VS Percent: 1 %
Brady Statistic RA Percent Paced: 45 %
Date Time Interrogation Session: 20201118155201
Implantable Lead Implant Date: 20171109
Implantable Lead Implant Date: 20171109
Implantable Lead Implant Date: 20171109
Implantable Lead Location: 753858
Implantable Lead Location: 753859
Implantable Lead Location: 753860
Implantable Pulse Generator Implant Date: 20171109
Lead Channel Impedance Value: 360 Ohm
Lead Channel Impedance Value: 490 Ohm
Lead Channel Impedance Value: 540 Ohm
Lead Channel Pacing Threshold Amplitude: 0.5 V
Lead Channel Pacing Threshold Amplitude: 0.5 V
Lead Channel Pacing Threshold Amplitude: 1.75 V
Lead Channel Pacing Threshold Pulse Width: 0.5 ms
Lead Channel Pacing Threshold Pulse Width: 0.5 ms
Lead Channel Pacing Threshold Pulse Width: 0.8 ms
Lead Channel Sensing Intrinsic Amplitude: 0.5 mV
Lead Channel Sensing Intrinsic Amplitude: 12 mV
Lead Channel Setting Pacing Amplitude: 2 V
Lead Channel Setting Pacing Amplitude: 2 V
Lead Channel Setting Pacing Amplitude: 2.5 V
Lead Channel Setting Pacing Pulse Width: 0.5 ms
Lead Channel Setting Pacing Pulse Width: 0.8 ms
Lead Channel Setting Sensing Sensitivity: 4 mV
Pulse Gen Model: 3262
Pulse Gen Serial Number: 7949810

## 2019-01-20 DIAGNOSIS — Z9981 Dependence on supplemental oxygen: Secondary | ICD-10-CM | POA: Diagnosis not present

## 2019-01-20 DIAGNOSIS — E7800 Pure hypercholesterolemia, unspecified: Secondary | ICD-10-CM | POA: Diagnosis not present

## 2019-01-20 DIAGNOSIS — H409 Unspecified glaucoma: Secondary | ICD-10-CM | POA: Diagnosis not present

## 2019-01-20 DIAGNOSIS — E538 Deficiency of other specified B group vitamins: Secondary | ICD-10-CM | POA: Diagnosis not present

## 2019-01-20 DIAGNOSIS — E559 Vitamin D deficiency, unspecified: Secondary | ICD-10-CM | POA: Diagnosis not present

## 2019-01-20 DIAGNOSIS — C3491 Malignant neoplasm of unspecified part of right bronchus or lung: Secondary | ICD-10-CM | POA: Diagnosis not present

## 2019-01-20 DIAGNOSIS — R339 Retention of urine, unspecified: Secondary | ICD-10-CM | POA: Diagnosis not present

## 2019-01-20 DIAGNOSIS — R1312 Dysphagia, oropharyngeal phase: Secondary | ICD-10-CM | POA: Diagnosis not present

## 2019-01-20 DIAGNOSIS — K5900 Constipation, unspecified: Secondary | ICD-10-CM | POA: Diagnosis not present

## 2019-01-20 DIAGNOSIS — I129 Hypertensive chronic kidney disease with stage 1 through stage 4 chronic kidney disease, or unspecified chronic kidney disease: Secondary | ICD-10-CM | POA: Diagnosis not present

## 2019-01-20 DIAGNOSIS — E876 Hypokalemia: Secondary | ICD-10-CM | POA: Diagnosis not present

## 2019-01-20 DIAGNOSIS — N182 Chronic kidney disease, stage 2 (mild): Secondary | ICD-10-CM | POA: Diagnosis not present

## 2019-01-20 DIAGNOSIS — N179 Acute kidney failure, unspecified: Secondary | ICD-10-CM | POA: Diagnosis not present

## 2019-01-20 DIAGNOSIS — K2100 Gastro-esophageal reflux disease with esophagitis, without bleeding: Secondary | ICD-10-CM | POA: Diagnosis not present

## 2019-01-20 DIAGNOSIS — I2510 Atherosclerotic heart disease of native coronary artery without angina pectoris: Secondary | ICD-10-CM | POA: Diagnosis not present

## 2019-01-20 DIAGNOSIS — Z452 Encounter for adjustment and management of vascular access device: Secondary | ICD-10-CM | POA: Diagnosis not present

## 2019-01-20 DIAGNOSIS — Y842 Radiological procedure and radiotherapy as the cause of abnormal reaction of the patient, or of later complication, without mention of misadventure at the time of the procedure: Secondary | ICD-10-CM | POA: Diagnosis not present

## 2019-01-20 DIAGNOSIS — T66XXXD Radiation sickness, unspecified, subsequent encounter: Secondary | ICD-10-CM | POA: Diagnosis not present

## 2019-01-20 DIAGNOSIS — I480 Paroxysmal atrial fibrillation: Secondary | ICD-10-CM | POA: Diagnosis not present

## 2019-01-23 DIAGNOSIS — T66XXXA Radiation sickness, unspecified, initial encounter: Secondary | ICD-10-CM | POA: Diagnosis not present

## 2019-01-23 DIAGNOSIS — K208 Other esophagitis without bleeding: Secondary | ICD-10-CM | POA: Diagnosis not present

## 2019-01-23 DIAGNOSIS — C3490 Malignant neoplasm of unspecified part of unspecified bronchus or lung: Secondary | ICD-10-CM | POA: Diagnosis not present

## 2019-01-23 DIAGNOSIS — R609 Edema, unspecified: Secondary | ICD-10-CM | POA: Diagnosis not present

## 2019-01-24 DIAGNOSIS — K5900 Constipation, unspecified: Secondary | ICD-10-CM | POA: Diagnosis not present

## 2019-01-24 DIAGNOSIS — Y842 Radiological procedure and radiotherapy as the cause of abnormal reaction of the patient, or of later complication, without mention of misadventure at the time of the procedure: Secondary | ICD-10-CM | POA: Diagnosis not present

## 2019-01-24 DIAGNOSIS — I129 Hypertensive chronic kidney disease with stage 1 through stage 4 chronic kidney disease, or unspecified chronic kidney disease: Secondary | ICD-10-CM | POA: Diagnosis not present

## 2019-01-24 DIAGNOSIS — Z9981 Dependence on supplemental oxygen: Secondary | ICD-10-CM | POA: Diagnosis not present

## 2019-01-24 DIAGNOSIS — E559 Vitamin D deficiency, unspecified: Secondary | ICD-10-CM | POA: Diagnosis not present

## 2019-01-24 DIAGNOSIS — I480 Paroxysmal atrial fibrillation: Secondary | ICD-10-CM | POA: Diagnosis not present

## 2019-01-24 DIAGNOSIS — H409 Unspecified glaucoma: Secondary | ICD-10-CM | POA: Diagnosis not present

## 2019-01-24 DIAGNOSIS — C3432 Malignant neoplasm of lower lobe, left bronchus or lung: Secondary | ICD-10-CM | POA: Diagnosis not present

## 2019-01-24 DIAGNOSIS — E876 Hypokalemia: Secondary | ICD-10-CM | POA: Diagnosis not present

## 2019-01-24 DIAGNOSIS — C3491 Malignant neoplasm of unspecified part of right bronchus or lung: Secondary | ICD-10-CM | POA: Diagnosis not present

## 2019-01-24 DIAGNOSIS — T66XXXD Radiation sickness, unspecified, subsequent encounter: Secondary | ICD-10-CM | POA: Diagnosis not present

## 2019-01-24 DIAGNOSIS — Z452 Encounter for adjustment and management of vascular access device: Secondary | ICD-10-CM | POA: Diagnosis not present

## 2019-01-24 DIAGNOSIS — N182 Chronic kidney disease, stage 2 (mild): Secondary | ICD-10-CM | POA: Diagnosis not present

## 2019-01-24 DIAGNOSIS — R339 Retention of urine, unspecified: Secondary | ICD-10-CM | POA: Diagnosis not present

## 2019-01-24 DIAGNOSIS — E7800 Pure hypercholesterolemia, unspecified: Secondary | ICD-10-CM | POA: Diagnosis not present

## 2019-01-24 DIAGNOSIS — R1312 Dysphagia, oropharyngeal phase: Secondary | ICD-10-CM | POA: Diagnosis not present

## 2019-01-24 DIAGNOSIS — N179 Acute kidney failure, unspecified: Secondary | ICD-10-CM | POA: Diagnosis not present

## 2019-01-24 DIAGNOSIS — K2100 Gastro-esophageal reflux disease with esophagitis, without bleeding: Secondary | ICD-10-CM | POA: Diagnosis not present

## 2019-01-24 DIAGNOSIS — E538 Deficiency of other specified B group vitamins: Secondary | ICD-10-CM | POA: Diagnosis not present

## 2019-01-24 DIAGNOSIS — I2510 Atherosclerotic heart disease of native coronary artery without angina pectoris: Secondary | ICD-10-CM | POA: Diagnosis not present

## 2019-01-25 ENCOUNTER — Other Ambulatory Visit: Payer: Self-pay

## 2019-01-25 ENCOUNTER — Ambulatory Visit (HOSPITAL_COMMUNITY)
Admission: RE | Admit: 2019-01-25 | Discharge: 2019-01-25 | Disposition: A | Discharge status: Home / Self Care | Payer: Medicare Other | Source: Ambulatory Visit | Attending: Hematology | Admitting: Hematology

## 2019-01-25 DIAGNOSIS — I2510 Atherosclerotic heart disease of native coronary artery without angina pectoris: Secondary | ICD-10-CM | POA: Diagnosis not present

## 2019-01-25 DIAGNOSIS — C3492 Malignant neoplasm of unspecified part of left bronchus or lung: Secondary | ICD-10-CM | POA: Diagnosis not present

## 2019-01-25 DIAGNOSIS — C771 Secondary and unspecified malignant neoplasm of intrathoracic lymph nodes: Secondary | ICD-10-CM | POA: Diagnosis not present

## 2019-01-25 DIAGNOSIS — I700 Atherosclerosis of aorta: Secondary | ICD-10-CM | POA: Diagnosis not present

## 2019-01-25 DIAGNOSIS — C801 Malignant (primary) neoplasm, unspecified: Secondary | ICD-10-CM | POA: Diagnosis not present

## 2019-01-25 MED ORDER — IOHEXOL 300 MG/ML  SOLN
75.0000 mL | Freq: Once | INTRAMUSCULAR | Status: AC | PRN
  Administered 2019-01-25: 75 mL via INTRAVENOUS

## 2019-01-27 DIAGNOSIS — E7800 Pure hypercholesterolemia, unspecified: Secondary | ICD-10-CM | POA: Diagnosis not present

## 2019-01-27 DIAGNOSIS — E876 Hypokalemia: Secondary | ICD-10-CM | POA: Diagnosis not present

## 2019-01-27 DIAGNOSIS — T66XXXD Radiation sickness, unspecified, subsequent encounter: Secondary | ICD-10-CM | POA: Diagnosis not present

## 2019-01-27 DIAGNOSIS — R1312 Dysphagia, oropharyngeal phase: Secondary | ICD-10-CM | POA: Diagnosis not present

## 2019-01-27 DIAGNOSIS — N179 Acute kidney failure, unspecified: Secondary | ICD-10-CM | POA: Diagnosis not present

## 2019-01-27 DIAGNOSIS — Z452 Encounter for adjustment and management of vascular access device: Secondary | ICD-10-CM | POA: Diagnosis not present

## 2019-01-27 DIAGNOSIS — E559 Vitamin D deficiency, unspecified: Secondary | ICD-10-CM | POA: Diagnosis not present

## 2019-01-27 DIAGNOSIS — Z9981 Dependence on supplemental oxygen: Secondary | ICD-10-CM | POA: Diagnosis not present

## 2019-01-27 DIAGNOSIS — K2100 Gastro-esophageal reflux disease with esophagitis, without bleeding: Secondary | ICD-10-CM | POA: Diagnosis not present

## 2019-01-27 DIAGNOSIS — H409 Unspecified glaucoma: Secondary | ICD-10-CM | POA: Diagnosis not present

## 2019-01-27 DIAGNOSIS — K5900 Constipation, unspecified: Secondary | ICD-10-CM | POA: Diagnosis not present

## 2019-01-27 DIAGNOSIS — R339 Retention of urine, unspecified: Secondary | ICD-10-CM | POA: Diagnosis not present

## 2019-01-27 DIAGNOSIS — E538 Deficiency of other specified B group vitamins: Secondary | ICD-10-CM | POA: Diagnosis not present

## 2019-01-27 DIAGNOSIS — I480 Paroxysmal atrial fibrillation: Secondary | ICD-10-CM | POA: Diagnosis not present

## 2019-01-27 DIAGNOSIS — I2510 Atherosclerotic heart disease of native coronary artery without angina pectoris: Secondary | ICD-10-CM | POA: Diagnosis not present

## 2019-01-27 DIAGNOSIS — Y842 Radiological procedure and radiotherapy as the cause of abnormal reaction of the patient, or of later complication, without mention of misadventure at the time of the procedure: Secondary | ICD-10-CM | POA: Diagnosis not present

## 2019-01-27 DIAGNOSIS — C3491 Malignant neoplasm of unspecified part of right bronchus or lung: Secondary | ICD-10-CM | POA: Diagnosis not present

## 2019-01-27 DIAGNOSIS — N182 Chronic kidney disease, stage 2 (mild): Secondary | ICD-10-CM | POA: Diagnosis not present

## 2019-01-27 DIAGNOSIS — I129 Hypertensive chronic kidney disease with stage 1 through stage 4 chronic kidney disease, or unspecified chronic kidney disease: Secondary | ICD-10-CM | POA: Diagnosis not present

## 2019-01-29 DIAGNOSIS — J189 Pneumonia, unspecified organism: Secondary | ICD-10-CM | POA: Insufficient documentation

## 2019-01-29 DIAGNOSIS — I482 Chronic atrial fibrillation, unspecified: Secondary | ICD-10-CM | POA: Insufficient documentation

## 2019-01-29 DIAGNOSIS — C3490 Malignant neoplasm of unspecified part of unspecified bronchus or lung: Secondary | ICD-10-CM | POA: Insufficient documentation

## 2019-01-29 DIAGNOSIS — H409 Unspecified glaucoma: Secondary | ICD-10-CM | POA: Insufficient documentation

## 2019-01-29 DIAGNOSIS — R001 Bradycardia, unspecified: Secondary | ICD-10-CM | POA: Insufficient documentation

## 2019-01-29 DIAGNOSIS — Z950 Presence of cardiac pacemaker: Secondary | ICD-10-CM | POA: Insufficient documentation

## 2019-01-29 DIAGNOSIS — E669 Obesity, unspecified: Secondary | ICD-10-CM | POA: Insufficient documentation

## 2019-01-29 DIAGNOSIS — Z7901 Long term (current) use of anticoagulants: Secondary | ICD-10-CM | POA: Insufficient documentation

## 2019-01-29 DIAGNOSIS — I5022 Chronic systolic (congestive) heart failure: Secondary | ICD-10-CM | POA: Insufficient documentation

## 2019-01-29 DIAGNOSIS — K208 Other esophagitis without bleeding: Secondary | ICD-10-CM | POA: Insufficient documentation

## 2019-01-29 DIAGNOSIS — I2510 Atherosclerotic heart disease of native coronary artery without angina pectoris: Secondary | ICD-10-CM | POA: Insufficient documentation

## 2019-01-29 DIAGNOSIS — H9313 Tinnitus, bilateral: Secondary | ICD-10-CM | POA: Insufficient documentation

## 2019-01-30 ENCOUNTER — Inpatient Hospital Stay (HOSPITAL_COMMUNITY): Payer: Medicare Other

## 2019-01-30 ENCOUNTER — Other Ambulatory Visit: Payer: Self-pay

## 2019-01-30 ENCOUNTER — Encounter (HOSPITAL_COMMUNITY): Payer: Self-pay | Admitting: Hematology

## 2019-01-30 ENCOUNTER — Inpatient Hospital Stay (HOSPITAL_COMMUNITY): Payer: Medicare Other | Admitting: Hematology

## 2019-01-30 VITALS — BP 131/65 | HR 70 | Temp 97.1°F | Resp 18 | Wt 172.2 lb

## 2019-01-30 DIAGNOSIS — I480 Paroxysmal atrial fibrillation: Secondary | ICD-10-CM | POA: Diagnosis not present

## 2019-01-30 DIAGNOSIS — Z7902 Long term (current) use of antithrombotics/antiplatelets: Secondary | ICD-10-CM | POA: Diagnosis not present

## 2019-01-30 DIAGNOSIS — C3492 Malignant neoplasm of unspecified part of left bronchus or lung: Secondary | ICD-10-CM

## 2019-01-30 DIAGNOSIS — C3490 Malignant neoplasm of unspecified part of unspecified bronchus or lung: Secondary | ICD-10-CM

## 2019-01-30 DIAGNOSIS — Z79899 Other long term (current) drug therapy: Secondary | ICD-10-CM | POA: Diagnosis not present

## 2019-01-30 DIAGNOSIS — Z923 Personal history of irradiation: Secondary | ICD-10-CM | POA: Diagnosis not present

## 2019-01-30 DIAGNOSIS — C3432 Malignant neoplasm of lower lobe, left bronchus or lung: Secondary | ICD-10-CM | POA: Diagnosis not present

## 2019-01-30 LAB — CBC WITH DIFFERENTIAL/PLATELET
Abs Immature Granulocytes: 0.01 10*3/uL (ref 0.00–0.07)
Basophils Absolute: 0 10*3/uL (ref 0.0–0.1)
Basophils Relative: 0 %
Eosinophils Absolute: 0.1 10*3/uL (ref 0.0–0.5)
Eosinophils Relative: 2 %
HCT: 31.2 % — ABNORMAL LOW (ref 39.0–52.0)
Hemoglobin: 9.6 g/dL — ABNORMAL LOW (ref 13.0–17.0)
Immature Granulocytes: 0 %
Lymphocytes Relative: 6 %
Lymphs Abs: 0.3 10*3/uL — ABNORMAL LOW (ref 0.7–4.0)
MCH: 35.4 pg — ABNORMAL HIGH (ref 26.0–34.0)
MCHC: 30.8 g/dL (ref 30.0–36.0)
MCV: 115.1 fL — ABNORMAL HIGH (ref 80.0–100.0)
Monocytes Absolute: 0.3 10*3/uL (ref 0.1–1.0)
Monocytes Relative: 7 %
Neutro Abs: 4.2 10*3/uL (ref 1.7–7.7)
Neutrophils Relative %: 85 %
Platelets: 210 10*3/uL (ref 150–400)
RBC: 2.71 MIL/uL — ABNORMAL LOW (ref 4.22–5.81)
RDW: 17.6 % — ABNORMAL HIGH (ref 11.5–15.5)
WBC: 5 10*3/uL (ref 4.0–10.5)
nRBC: 0 % (ref 0.0–0.2)

## 2019-01-30 LAB — COMPREHENSIVE METABOLIC PANEL
ALT: 14 U/L (ref 0–44)
AST: 16 U/L (ref 15–41)
Albumin: 3.4 g/dL — ABNORMAL LOW (ref 3.5–5.0)
Alkaline Phosphatase: 68 U/L (ref 38–126)
Anion gap: 8 (ref 5–15)
BUN: 12 mg/dL (ref 8–23)
CO2: 27 mmol/L (ref 22–32)
Calcium: 8.7 mg/dL — ABNORMAL LOW (ref 8.9–10.3)
Chloride: 97 mmol/L — ABNORMAL LOW (ref 98–111)
Creatinine, Ser: 0.91 mg/dL (ref 0.61–1.24)
GFR calc Af Amer: >60 mL/min (ref >60)
GFR calc non Af Amer: >60 mL/min (ref >60)
Glucose, Bld: 130 mg/dL — ABNORMAL HIGH (ref 70–99)
Potassium: 3.9 mmol/L (ref 3.5–5.1)
Sodium: 132 mmol/L — ABNORMAL LOW (ref 135–145)
Total Bilirubin: 0.9 mg/dL (ref 0.3–1.2)
Total Protein: 6.9 g/dL (ref 6.5–8.1)

## 2019-01-30 LAB — IRON AND TIBC
Iron: 59 ug/dL (ref 45–182)
Saturation Ratios: 24 % (ref 17.9–39.5)
TIBC: 245 ug/dL — ABNORMAL LOW (ref 250–450)
UIBC: 186 ug/dL

## 2019-01-30 LAB — FOLATE: Folate: 6.1 ng/mL (ref >5.9)

## 2019-01-30 LAB — VITAMIN B12: Vitamin B-12: 500 pg/mL (ref 180–914)

## 2019-01-30 LAB — FERRITIN: Ferritin: 184 ng/mL (ref 24–336)

## 2019-01-30 NOTE — Progress Notes (Signed)
DISCONTINUE ON PATHWAY REGIMEN - Non-Small Cell Lung     Administer weekly:     Paclitaxel      Carboplatin   **Always confirm dose/schedule in your pharmacy ordering system**  REASON: Continuation Of Treatment PRIOR TREATMENT: [id number]: Carboplatin AUC=2 + Paclitaxel 45 mg/m2 Weekly During Radiation TREATMENT RESPONSE: Partial Response (PR)  START ON PATHWAY REGIMEN - Non-Small Cell Lung     A cycle is every 14 days:     Durvalumab   **Always confirm dose/schedule in your pharmacy ordering system**  Patient Characteristics: Stage III - Unresectable, PS = 0, 1 AJCC T Category: T1c Current Disease Status: No Distant Mets or Local Recurrence AJCC N Category: N3 AJCC M Category: M0 AJCC 8 Stage Grouping: IIIB ECOG Performance Status: 1 Intent of Therapy: Non-Curative / Palliative Intent, Discussed with Patient

## 2019-01-30 NOTE — Progress Notes (Signed)
Nicholas Sanchez, [place] [address]   CLINIC:  Medical Oncology/Hematology  PCP:  Nicholas Sanchez, [place] [address] [phone]   REASON FOR VISIT:  Follow-up for lung cancer.  CURRENT THERAPY: Recommended combination chemoradiation therapy.   CANCER STAGING: Cancer Staging Squamous cell carcinoma of lung (HCC) Staging form: Lung, AJCC 8th Edition - Clinical stage from 11/09/2018: Stage IIIB (cT1c, cN3, cM0) - Unsigned    INTERVAL HISTORY:  Nicholas Sanchez 85 y.o. male seen for follow-up of lung cancer.  He has been discharged home from rehab facility about a week ago.  He is walking with help of walker.  He is also eating better since he got home.  He does not report any difficulty swallowing or painful swallowing.  He does have some constipation which is chronic.  Headaches on and off is also chronic.  Mild cough is present which is stable.    REVIEW OF SYSTEMS:  Review of Systems  Respiratory: Positive for cough.   Gastrointestinal: Positive for constipation.  Neurological: Positive for headaches.  Psychiatric/Behavioral: Positive for sleep disturbance.  All other systems reviewed and are negative.    PAST MEDICAL/SURGICAL HISTORY:  Past Medical History:  Diagnosis Date  . Atherosclerotic heart disease of native coronary artery without angina pectoris   . Bradycardia, unspecified   . CAD (coronary artery disease)    a. STEMI 04/2008 s/p BMS to prox & distal RCA, staged DES to LAD same admission. b. Nuc 03/2017: scar but no ischemia, EF 45-54%. c. 09/2017: NSTEMI - DES x 2 to proximal and mid-RCA  . Cancer ([place])   . Chronic anticoagulation   . Chronic atrial fibrillation ([place])   . Chronic systolic (congestive) heart failure ([place])   . CKD (chronic kidney disease), stage II   . Cough, persistent   . GERD (gastroesophageal reflux disease)   . History of BPH   . HTN (hypertension)   . Hypercholesteremia   .  LV dysfunction    a. EF 45-50% in 01/2016.  . Malignant neoplasm of unspecified part of unspecified bronchus or lung ([place])   . Mild pulmonary hypertension ([place])   . Obesity, unspecified   . Other esophagitis without bleeding   . PAF (paroxysmal atrial fibrillation) ([place])   . Pneumonia, unspecified organism   . Port-A-Cath in place 11/21/2018  . Presence of cardiac pacemaker   . Presence of permanent cardiac pacemaker   . Sinus node dysfunction ([place])    a. s/p StJude CRT-pacemaker 01/2016.  [name] Tinnitus, bilateral   . Unspecified glaucoma    Past Surgical History:  Procedure Laterality Date  . BIOPSY  12/15/2018   Procedure: BIOPSY;  Surgeon: [name], MD;  [facility];  Service: Endoscopy;;  esophageal  . CARDIAC CATHETERIZATION    . COLONOSCOPY    . COLONOSCOPY N/A 08/27/2017   Dr. [name]: 25 5-25 mm polyps in descending colon, ascending, cecum. 2X3 cm carpet polyp in base of cecum lifted away s/p piecemeal polypectomy. Tubulovillous adenoma and tubular adenomas  . CORONARY STENT INTERVENTION N/A 10/22/2017   Procedure: CORONARY STENT INTERVENTION;  Surgeon: [name], MD;  Location: [facility];  Service: Cardiovascular;  Laterality: N/A;  . CORONARY STENT PLACEMENT  04/2008   RCA and LAD  . EP IMPLANTABLE DEVICE N/A 01/09/2016   Procedure: BiV Pacemaker Insertion CRT-P;  Surgeon: [name], MD;  Location: [facility];  Service: Cardiovascular;  Laterality: N/A;  .  ESOPHAGOGASTRODUODENOSCOPY N/A 08/27/2017   normal esophagus s/p dilation, gastric petechia, small hiatal hernia, normal duodenum  . ESOPHAGOGASTRODUODENOSCOPY (EGD) WITH PROPOFOL N/A 12/15/2018   Procedure: ESOPHAGOGASTRODUODENOSCOPY (EGD) WITH PROPOFOL;  Surgeon: [name], MD;  [facility];  Service: Endoscopy;  Laterality: N/A;  . INSERT / REPLACE / REMOVE PACEMAKER    . MALONEY DILATION N/A 08/27/2017   Procedure: [name] DILATION;  Surgeon: [name],  MD;  [facility];  Service: Endoscopy;  Laterality: N/A;  . PORTACATH PLACEMENT Right 11/18/2018   Procedure: INSERTION PORT-A-CATH;  Surgeon: [name], MD;  Location: [facility];  Service: General;  Laterality: Right;  . RIGHT/LEFT HEART CATH AND CORONARY ANGIOGRAPHY N/A 10/21/2017   Procedure: RIGHT/LEFT HEART CATH AND CORONARY ANGIOGRAPHY;  Surgeon: [name], MD;  Location: [facility];  Service: Cardiovascular;  Laterality: N/A;     SOCIAL HISTORY:  Social History   Socioeconomic History  . Marital status: Married    Spouse name: Air traffic controller  . Number of children: 3  . Years of education: Not on file  . Highest education level: Not on file  Occupational History  . Occupation: retired    Comment: Geographical information systems officer  . Financial resource strain: Not very hard  . Food insecurity    Worry: Never true    Inability: Never true  . Transportation needs    Medical: No    Non-medical: No  Tobacco Use  . Smoking status: Former Smoker    Packs/day: 2.50    Years: 25.00    Pack years: 62.50    Types: Cigarettes    Quit date: 06/14/1973    Years since quitting: 45.6  . Smokeless tobacco: Former Systems developer    Types: Chew    Quit date: 06/14/1973  . Tobacco comment: some/chewed very little for about 2 years  Substance and Sexual Activity  . Alcohol use: No  . Drug use: No  . Sexual activity: Not on file  Lifestyle  . Physical activity    Days per week: 0 days    Minutes per session: 0 min  . Stress: Not at all  Relationships  . Social connections    Talks on phone: More than three times a week    Gets together: More than three times a week    Attends religious service: More than 4 times per year    Active member of club or organization: No    Attends meetings of clubs or organizations: Never    Relationship status: Married  . Intimate partner violence    Fear of current or ex partner: No    Emotionally abused: No    Physically abused: No    Forced sexual  activity: No  Other Topics Concern  . 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     FAMILY HISTORY:  Family History  Problem Relation Age of Onset  . Emphysema Mother        mother died with emphysema and cancer  . Cancer Mother   . Prostate cancer Brother   . Heart disease Brother   . Hypothyroidism Daughter   . Hypothyroidism Daughter   . Colon cancer Neg Hx   . Colon polyps Neg Hx     CURRENT MEDICATIONS:  Outpatient Encounter Medications as of 01/30/2019  Medication Sig Note  . aspirin EC 81 MG tablet Take 81 mg by mouth daily.   . cholecalciferol (  VITAMIN D) 1000 units tablet Take 1,000 Units by mouth 2 (two) times daily.   . dorzolamide-timolol (COSOPT) 22.3-6.8 MG/ML ophthalmic solution Place 1 drop into the left eye 2 (two) times daily.   . hydrochlorothiazide (HYDRODIURIL) 25 MG tablet Take 25 mg by mouth daily.   . metoprolol succinate (TOPROL-XL) 50 MG 24 hr tablet Take 1 tablet (50 mg total) by mouth daily. Take with or immediately following a meal.   . pantoprazole (PROTONIX) 40 MG tablet Take 1 tablet (40 mg total) by mouth daily before breakfast.   . potassium chloride SA (KLOR-CON) 20 MEQ tablet Take 2 tablets (40 mEq total) by mouth 2 (two) times daily. For 2 days   . sucralfate (CARAFATE) 1 GM/10ML suspension Take 10 mLs (1 g total) by mouth 4 (four) times daily -  with meals and at bedtime.   . tamsulosin (FLOMAX) 0.4 MG CAPS capsule Take 0.4 mg by mouth daily.   . vitamin B-12 (CYANOCOBALAMIN) 1000 MCG tablet Take 1,000 mcg by mouth daily.   . [DISCONTINUED] lovastatin (MEVACOR) 10 MG tablet Take 10 mg by mouth at bedtime.   [name] acetaminophen (TYLENOL) 500 MG tablet Take 500 mg by mouth every 6 (six) hours as needed for pain.   [name] lidocaine (XYLOCAINE) 2 % solution Use as directed 15 mLs in the mouth or throat every 3 (three) hours as needed for mouth pain. (Patient not taking: Reported on 01/09/2019)   .  lidocaine-prilocaine (EMLA) cream Apply a dime-sized amount to port a cath site and cover with plastic wrap one hour prior to chemotherapy appointments (Patient not taking: Reported on 01/09/2019) 12/12/2018: Last filled 11/15/18  . magic mouthwash w/lidocaine SOLN Take 10 mLs by mouth 3 (three) times daily. (Patient not taking: Reported on 01/09/2019)   . nitroGLYCERIN (NITROSTAT) 0.4 MG SL tablet Place 0.4 mg under the tongue every 5 (five) minutes as needed for chest pain.   [name] ondansetron (ZOFRAN ODT) 8 MG disintegrating tablet Take 1 tablet (8 mg total) by mouth 2 (two) times daily as needed for nausea or vomiting. (Patient not taking: Reported on 11/28/2018) 12/12/2018: Last filled 11/22/18  . oxyCODONE-acetaminophen (PERCOCET/ROXICET) 5-325 MG tablet Take 1 tablet by mouth every 8 (eight) hours as needed for moderate pain. (Patient not taking: Reported on 01/30/2019)   . polyethylene glycol (MIRALAX / GLYCOLAX) 17 g packet Take 17 g by mouth daily as needed for mild constipation. (Patient not taking: Reported on 01/02/2019)   . prochlorperazine (COMPAZINE) 10 MG tablet Take 1 tablet (10 mg total) by mouth every 6 (six) hours as needed for nausea or vomiting. (Patient not taking: Reported on 11/28/2018) 12/12/2018: Last filled 11/15/18   No facility-administered encounter medications on file as of 01/30/2019.     ALLERGIES:  Allergies  Allergen Reactions  . Ace Inhibitors Nausea Only  . Tape Rash    And Bandaids, too     PHYSICAL EXAM:  ECOG Performance status: 1  Vitals:   01/30/19 1501  BP: 131/65  Pulse: 70  Resp: 18  Temp: (!) 97.1 F (36.2 C)  SpO2: 98%   Filed Weights   01/30/19 1501  Weight: 172 lb 3.2 oz (78.1 kg)    Physical Exam Vitals signs reviewed.  Constitutional:      Appearance: Normal appearance.  Cardiovascular:     Rate and Rhythm: Normal rate and regular rhythm.     Heart sounds: Normal heart sounds.  Pulmonary:     Effort: Pulmonary effort is normal.  Breath sounds: Normal breath sounds.  Abdominal:     General: There is no distension.     Palpations: Abdomen is soft. There is no mass.  Musculoskeletal:        General: No swelling.  Skin:    General: Skin is warm.  Neurological:     General: No focal deficit present.     Mental Status: He is alert and oriented to person, place, and time.  Psychiatric:        Mood and Affect: Mood normal.        Behavior: Behavior normal.      LABORATORY DATA:  I have reviewed the labs as listed.  CBC    Component Value Date/Time   WBC 5.0 01/30/2019 1426   RBC 2.71 (L) 01/30/2019 1426   HGB 9.6 (L) 01/30/2019 1426   HCT 31.2 (L) 01/30/2019 1426   PLT 210 01/30/2019 1426   MCV 115.1 (H) 01/30/2019 1426   MCH 35.4 (H) 01/30/2019 1426   MCHC 30.8 01/30/2019 1426   RDW 17.6 (H) 01/30/2019 1426   LYMPHSABS 0.3 (L) 01/30/2019 1426   MONOABS 0.3 01/30/2019 1426   EOSABS 0.1 01/30/2019 1426   BASOSABS 0.0 01/30/2019 1426   CMP Latest Ref Rng & Units 01/30/2019 01/09/2019 01/02/2019  Glucose 70 - 99 mg/dL 130(H) 109(H) 89  BUN 8 - 23 mg/dL 12 13 14   Creatinine 0.61 - 1.24 mg/dL 0.91 0.96 0.88  Sodium 135 - 145 mmol/L 132(L) 135 137  Potassium 3.5 - 5.1 mmol/L 3.9 3.3(L) 3.2(L)  Chloride 98 - 111 mmol/L 97(L) 99 100  CO2 22 - 32 mmol/L 27 28 24   Calcium 8.9 - 10.3 mg/dL 8.7(L) 8.3(L) 8.3(L)  Total Protein 6.5 - 8.1 g/dL 6.9 5.8(L) 5.7(L)  Total Bilirubin 0.3 - 1.2 mg/dL 0.9 2.2(H) 3.3(H)  Alkaline Phos 38 - 126 U/L 68 60 54  AST 15 - 41 U/L 16 18 23   ALT 0 - 44 U/L 14 20 30        DIAGNOSTIC IMAGING:  I have independently reviewed the scans and discussed with the patient.    ASSESSMENT & PLAN:   Squamous cell carcinoma of lung ([place]) 1.  [name] squamous cell carcinoma left lung: -PET scan on 10/17/2018 showed hypermetabolic left lower lobe nodule, left hilar and contralateral right lower paratracheal lymph node along with right hilar lymph nodes.  Hypermetabolic subcarinal lymph  nodes present.  Hypermetabolic lymph node in the porta hepatis with SUV 4.9, measuring 1.1 cm, favoring reactive adenopathy. -26 pound weight loss in the last 1 year, ex-smoker quit in 1975. -Left lower lobe lung biopsy on 10/31/2018 consistent with squamous cell carcinoma. - Weekly carbotaxol started on 11/21/2018 and radiation on 11/22/2018. -He received 3 weekly doses of carboplatin and paclitaxel, last dose on 12/05/2018.  Last XRT was on 12/09/2018. -He had prolonged hospitalization from 12/12/2018 through 12/26/2018 with severe radiation esophagitis and weakness.  He was subsequently discharged to a rehab facility.  He is currently been home since last 1 week. -He cannot tolerate any further chemo or radiation. -We discussed results of the CT chest with contrast dated 01/25/2019 which showed posterior left lower lobe cavitary nodule measuring 1.7 x 2.7 cm, previously 2.8 x 3.0 cm.  Improvement in size of the nodule meta stasis.  No new lesions were seen. -He has gotten partial response.  At this time I have recommended starting him on consolidation immunotherapy with durvalumab.  We discussed the side effects including less than 5% chance of  pneumonitis, diarrhea and skin rash. -His appetite has improved.  He does not have any difficulty swallowing at this time.  His weight is also picking up. -We will likely start him on immunotherapy consolidation next week. -We will closely monitor his weight.  2.  Paroxysmal atrial fibrillation: -Xarelto is on hold.  He is taking aspirin 81 mg daily.  No hemoptysis reported.  3.  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  He had a pacemaker in November 2018. - Plavix is on hold since hemoptysis.  He will continue aspirin 81 mg daily.    Total time spent is 25 minutes with more than 50% of the time spent face-to-face discussing scan results, treatment plan, counseling and coordination of care.  Orders placed this encounter:  Orders Placed This  Encounter  Procedures  . CBC with Differential/Platelet  . Comprehensive metabolic panel      Derek Jack, MD [place] [phone]

## 2019-01-30 NOTE — Patient Instructions (Addendum)
[place] at [facility] Discharge Instructions  You were seen today by Dr. Delton Coombes. He went over your recent lab results. He would like to start you on immunotherapy, it would be given every 2 weeks for a year. We will reevaluate your cancer through scans every few months. He will see you back soon for labs, treatment and follow up.   Thank you for choosing [place] at [facility] to provide your oncology and hematology care.  To afford each patient quality time with our provider, please arrive at least 15 minutes before your scheduled appointment time.   If you have a lab appointment with the [facility] please come in thru the  Main Entrance and check in at the main information desk  You need to re-schedule your appointment should you arrive 10 or more minutes late.  We strive to give you quality time with our providers, and arriving late affects you and other patients whose appointments are after yours.  Also, if you no show three or more times for appointments you may be dismissed from the clinic at the providers discretion.     Again, thank you for choosing [facility].  Our hope is that these requests will decrease the amount of time that you wait before being seen by our physicians.       _____________________________________________________________  Should you have questions after your visit to [facility], please contact our office at [phone] between the hours of 8:00 a.m. and 4:30 p.m.  Voicemails left after 4:00 p.m. will not be returned until the following business day.  For prescription refill requests, have your pharmacy contact our office and allow 72 hours.    Cancer Center Support Programs:   > Cancer Support Group  2nd Tuesday of the month 1pm-2pm, [facility]

## 2019-01-30 NOTE — Assessment & Plan Note (Addendum)
1.  [name] squamous cell carcinoma left lung: -PET scan on 10/17/2018 showed hypermetabolic left lower lobe nodule, left hilar and contralateral right lower paratracheal lymph node along with right hilar lymph nodes.  Hypermetabolic subcarinal lymph nodes present.  Hypermetabolic lymph node in the porta hepatis with SUV 4.9, measuring 1.1 cm, favoring reactive adenopathy. -26 pound weight loss in the last 1 year, ex-smoker quit in 1975. -Left lower lobe lung biopsy on 10/31/2018 consistent with squamous cell carcinoma. - Weekly carbotaxol started on 11/21/2018 and radiation on 11/22/2018. -He received 3 weekly doses of carboplatin and paclitaxel, last dose on 12/05/2018.  Last XRT was on 12/09/2018. -He had prolonged hospitalization from 12/12/2018 through 12/26/2018 with severe radiation esophagitis and weakness.  He was subsequently discharged to a rehab facility.  He is currently been home since last 1 week. -He cannot tolerate any further chemo or radiation. -We discussed results of the CT chest with contrast dated 01/25/2019 which showed posterior left lower lobe cavitary nodule measuring 1.7 x 2.7 cm, previously 2.8 x 3.0 cm.  Improvement in size of the nodule meta stasis.  No new lesions were seen. -He has gotten partial response.  At this time I have recommended starting him on consolidation immunotherapy with durvalumab.  We discussed the side effects including less than 5% chance of pneumonitis, diarrhea and skin rash. -His appetite has improved.  He does not have any difficulty swallowing at this time.  His weight is also picking up. -We will likely start him on immunotherapy consolidation next week. -We will closely monitor his weight.  2.  Paroxysmal atrial fibrillation: -Xarelto is on hold.  He is taking aspirin 81 mg daily.  No hemoptysis reported.  3.  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  He had a pacemaker in November 2018. - Plavix is on hold since hemoptysis.  He will continue  aspirin 81 mg daily.

## 2019-01-31 DIAGNOSIS — E559 Vitamin D deficiency, unspecified: Secondary | ICD-10-CM | POA: Diagnosis not present

## 2019-01-31 DIAGNOSIS — K2100 Gastro-esophageal reflux disease with esophagitis, without bleeding: Secondary | ICD-10-CM | POA: Diagnosis not present

## 2019-01-31 DIAGNOSIS — E7800 Pure hypercholesterolemia, unspecified: Secondary | ICD-10-CM | POA: Diagnosis not present

## 2019-01-31 DIAGNOSIS — N182 Chronic kidney disease, stage 2 (mild): Secondary | ICD-10-CM | POA: Diagnosis not present

## 2019-01-31 DIAGNOSIS — I2510 Atherosclerotic heart disease of native coronary artery without angina pectoris: Secondary | ICD-10-CM | POA: Diagnosis not present

## 2019-01-31 DIAGNOSIS — K5900 Constipation, unspecified: Secondary | ICD-10-CM | POA: Diagnosis not present

## 2019-01-31 DIAGNOSIS — E538 Deficiency of other specified B group vitamins: Secondary | ICD-10-CM | POA: Diagnosis not present

## 2019-01-31 DIAGNOSIS — N179 Acute kidney failure, unspecified: Secondary | ICD-10-CM | POA: Diagnosis not present

## 2019-01-31 DIAGNOSIS — Z452 Encounter for adjustment and management of vascular access device: Secondary | ICD-10-CM | POA: Diagnosis not present

## 2019-01-31 DIAGNOSIS — R339 Retention of urine, unspecified: Secondary | ICD-10-CM | POA: Diagnosis not present

## 2019-01-31 DIAGNOSIS — C3491 Malignant neoplasm of unspecified part of right bronchus or lung: Secondary | ICD-10-CM | POA: Diagnosis not present

## 2019-01-31 DIAGNOSIS — T66XXXD Radiation sickness, unspecified, subsequent encounter: Secondary | ICD-10-CM | POA: Diagnosis not present

## 2019-01-31 DIAGNOSIS — Z9981 Dependence on supplemental oxygen: Secondary | ICD-10-CM | POA: Diagnosis not present

## 2019-01-31 DIAGNOSIS — H409 Unspecified glaucoma: Secondary | ICD-10-CM | POA: Diagnosis not present

## 2019-01-31 DIAGNOSIS — R1312 Dysphagia, oropharyngeal phase: Secondary | ICD-10-CM | POA: Diagnosis not present

## 2019-01-31 DIAGNOSIS — I480 Paroxysmal atrial fibrillation: Secondary | ICD-10-CM | POA: Diagnosis not present

## 2019-01-31 DIAGNOSIS — I129 Hypertensive chronic kidney disease with stage 1 through stage 4 chronic kidney disease, or unspecified chronic kidney disease: Secondary | ICD-10-CM | POA: Diagnosis not present

## 2019-01-31 DIAGNOSIS — E876 Hypokalemia: Secondary | ICD-10-CM | POA: Diagnosis not present

## 2019-01-31 DIAGNOSIS — Y842 Radiological procedure and radiotherapy as the cause of abnormal reaction of the patient, or of later complication, without mention of misadventure at the time of the procedure: Secondary | ICD-10-CM | POA: Diagnosis not present

## 2019-02-01 DIAGNOSIS — Z9981 Dependence on supplemental oxygen: Secondary | ICD-10-CM | POA: Diagnosis not present

## 2019-02-01 DIAGNOSIS — E538 Deficiency of other specified B group vitamins: Secondary | ICD-10-CM | POA: Diagnosis not present

## 2019-02-01 DIAGNOSIS — I2510 Atherosclerotic heart disease of native coronary artery without angina pectoris: Secondary | ICD-10-CM | POA: Diagnosis not present

## 2019-02-01 DIAGNOSIS — I480 Paroxysmal atrial fibrillation: Secondary | ICD-10-CM | POA: Diagnosis not present

## 2019-02-01 DIAGNOSIS — K2100 Gastro-esophageal reflux disease with esophagitis, without bleeding: Secondary | ICD-10-CM | POA: Diagnosis not present

## 2019-02-01 DIAGNOSIS — Z452 Encounter for adjustment and management of vascular access device: Secondary | ICD-10-CM | POA: Diagnosis not present

## 2019-02-01 DIAGNOSIS — T66XXXD Radiation sickness, unspecified, subsequent encounter: Secondary | ICD-10-CM | POA: Diagnosis not present

## 2019-02-01 DIAGNOSIS — E876 Hypokalemia: Secondary | ICD-10-CM | POA: Diagnosis not present

## 2019-02-01 DIAGNOSIS — Y842 Radiological procedure and radiotherapy as the cause of abnormal reaction of the patient, or of later complication, without mention of misadventure at the time of the procedure: Secondary | ICD-10-CM | POA: Diagnosis not present

## 2019-02-01 DIAGNOSIS — R1312 Dysphagia, oropharyngeal phase: Secondary | ICD-10-CM | POA: Diagnosis not present

## 2019-02-01 DIAGNOSIS — N179 Acute kidney failure, unspecified: Secondary | ICD-10-CM | POA: Diagnosis not present

## 2019-02-01 DIAGNOSIS — C3491 Malignant neoplasm of unspecified part of right bronchus or lung: Secondary | ICD-10-CM | POA: Diagnosis not present

## 2019-02-01 DIAGNOSIS — K5900 Constipation, unspecified: Secondary | ICD-10-CM | POA: Diagnosis not present

## 2019-02-01 DIAGNOSIS — R339 Retention of urine, unspecified: Secondary | ICD-10-CM | POA: Diagnosis not present

## 2019-02-01 DIAGNOSIS — I129 Hypertensive chronic kidney disease with stage 1 through stage 4 chronic kidney disease, or unspecified chronic kidney disease: Secondary | ICD-10-CM | POA: Diagnosis not present

## 2019-02-01 DIAGNOSIS — N182 Chronic kidney disease, stage 2 (mild): Secondary | ICD-10-CM | POA: Diagnosis not present

## 2019-02-01 DIAGNOSIS — H409 Unspecified glaucoma: Secondary | ICD-10-CM | POA: Diagnosis not present

## 2019-02-01 DIAGNOSIS — E559 Vitamin D deficiency, unspecified: Secondary | ICD-10-CM | POA: Diagnosis not present

## 2019-02-01 DIAGNOSIS — E7800 Pure hypercholesterolemia, unspecified: Secondary | ICD-10-CM | POA: Diagnosis not present

## 2019-02-02 ENCOUNTER — Inpatient Hospital Stay (HOSPITAL_COMMUNITY): Payer: Medicare Other | Attending: Hematology

## 2019-02-02 ENCOUNTER — Other Ambulatory Visit: Payer: Self-pay

## 2019-02-02 DIAGNOSIS — Z923 Personal history of irradiation: Secondary | ICD-10-CM | POA: Insufficient documentation

## 2019-02-02 DIAGNOSIS — Z79899 Other long term (current) drug therapy: Secondary | ICD-10-CM | POA: Insufficient documentation

## 2019-02-02 DIAGNOSIS — Z5111 Encounter for antineoplastic chemotherapy: Secondary | ICD-10-CM | POA: Insufficient documentation

## 2019-02-02 DIAGNOSIS — Z7902 Long term (current) use of antithrombotics/antiplatelets: Secondary | ICD-10-CM | POA: Insufficient documentation

## 2019-02-02 DIAGNOSIS — I480 Paroxysmal atrial fibrillation: Secondary | ICD-10-CM | POA: Insufficient documentation

## 2019-02-02 DIAGNOSIS — C3432 Malignant neoplasm of lower lobe, left bronchus or lung: Secondary | ICD-10-CM | POA: Insufficient documentation

## 2019-02-02 DIAGNOSIS — Z95828 Presence of other vascular implants and grafts: Secondary | ICD-10-CM

## 2019-02-02 DIAGNOSIS — C3490 Malignant neoplasm of unspecified part of unspecified bronchus or lung: Secondary | ICD-10-CM

## 2019-02-02 NOTE — Patient Instructions (Signed)
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  We are going to be treating you with palliative intent. This means that your cancer is not curable but it is treatable.  We will be treating you with Durvalumab (Imfinizi) once every 14 days.  This will be given through your port a cath. You will see the doctor regularly throughout treatment.  We will obtain blood work from you prior to every treatment and monitor your results to make sure it is safe to give your treatment. The doctor monitors your response to treatment by the way you are feeling, your blood work, and by obtaining scans periodically.  There will be wait times while you are here for treatment.  It will take about 30 minutes to 1 hour for your lab work to result.  Then there will be wait times while pharmacy mixes your medications.    Durvalumab (Imfinzi)  About This Drug Durvalumab is used to treat cancer. This drug is given in the vein (IV).  Possible Side Effects . Nausea . Constipation (not able to move bowels) . Tiredness . Swelling of your legs, ankles and/or feet . Urinary tract infection . Decreased appetite (decreased hunger) . Muscle and bone pain . Inflammation (swelling) of the lungs. You may have a dry cough or trouble breathing. . Cough . Trouble breathing . Upper respiratory tract infection . Rash  Note: Each of the side effects above was reported in 15% or greater of patients treated with durvalumab alone. Your side effects may be different if taking durvalumab in combination with other chemotherapy. Not all possible side effects are included above.  Warnings and Precautions . This drug works with your immune system and can cause inflammation in any of your organs and tissues and can change how they work. This may put you at risk for developing serious medical problems, which can be life-threatening. . Severe inflammation of the lungs,  which can be life-threatening . Colitis which is swelling in the colon. The symptoms are diarrhea, stomach cramping, and sometimes blood in the bowel movements. . Severe changes in your liver function, which can cause liver failure and be life-threatening . Changes in your kidney function, which can cause kidney failure and be life-threatening . This drug may affect some of your hormone glands (especially the thyroid, adrenals, pituitary and pancreas). . Severe allergic skin reaction, which can be life-threatening. You may develop blisters on your skin that are filled with fluid or a severe red rash all over your body that may be painful. . Severe infections which can be life-threatening . While you are getting this drug in your vein (IV), you may have a reaction to the drug which can be life-threatening. Sometimes you may be given medication to stop or lessen these side effects. Your nurse will check you closely for these signs: fever or shaking chills, flushing, facial swelling, feeling dizzy, headache, trouble breathing, rash, itching, chest tightness, or chest pain. These reactions may happen after your infusion. If this happens, call 911 for emergency care. Note: Some of the side effects above are very rare. If you have concerns and/or questions, please discuss them with your medical team.  Important Information . This drug may be present in the saliva, tears, sweat, urine, stool, vomit, semen, and vaginal secretions. Talk to your doctor and/or your nurse about the necessary precautions to take during this time.  Treating Side Effects . To decrease the risk of  infection, wash your hands regularly. . Avoid close contact with people who have a cold, the flu, or other infections. . Take your temperature as your doctor or nurse tells you, and whenever you feel like you may have a fever. . Manage tiredness by pacing your activities for the day. . Be sure to include periods of rest  between energy-draining activities. . Drink plenty of fluids (a minimum of eight glasses per day is recommended). . If you throw up or have loose bowel movements, you should drink more fluids so that you do not become dehydrated (lack of water in the body from losing too much fluid). . To help with nausea, eat small, frequent meals instead of three large meals a day. Choose foods and drinks that are at room temperature. Ask your nurse or doctor about other helpful tips and medicine that is available to help stop or lessen these symptoms. . To help with decreased appetite, eat small, frequent meals. Eat foods high in calories and protein, such as meat, poultry, fish, dry beans, tofu, eggs, nuts, milk, yogurt, cheese, ice cream, pudding, and nutritional supplements. . Consider using sauces and spices to increase taste. Daily exercise, with your doctor's approval, may increase your appetite. . Ask your doctor or nurse about medicines that are available to help stop or lessen constipation or diarrhea. . If you are not able to move your bowels, check with your doctor or nurse before you use enemas, laxatives, or suppositories . If you have diabetes, keep good control of your blood sugar level. Tell your nurse or your doctor if your glucose levels are higher or lower than normal. . Keeping your pain under control is important to your well-being. Please tell your doctor or nurse if you are experiencing pain. . Get regular exercise, with your doctor's approval. If you feel too tired to exercise vigorously, try taking a short walk. . Infusion reactions may happen for after your infusion. If this happens, call 911 for emergency care. . If you get a rash do not put anything on it unless your doctor or nurse says you may. Keep the area around the rash clean and dry. Ask your doctor for medicine if your rash bothers you.  Food and Drug Interactions . There are no known interactions of durvalumab with  food. . This drug may interact with other medicines. Tell your doctor and pharmacist about all the prescription and over-the-counter medicines and dietary supplements (vitamins, minerals, herbs, and others) that you are taking at this time. Also, check with your doctor or pharmacist before starting any new prescription or over-the-counter medicines, or dietary supplements to make sure that there are no interactions.  When to Call the Doctor Call your doctor or nurse if you have any of these symptoms and/or any new or unusual symptoms: . Fever of 100.4 F (38 C) or higher . Chills, flushing . Signs of a local infection such as pain, redness, tenderness, warmth and/or swelling . Headache that does not go away . Changes in mood or behavior . Feeling dizzy or lightheaded . Tiredness or weakness that interferes with your daily activities . Confusion . Feeling that your heart is beating fast or in a not normal way (palpitations) . Wheezing or trouble breathing . Pain in your chest . Dry cough . Coughing up yellow, green, or bloody mucus . Easy bleeding or bruising . Nausea that stops you from eating or drinking and/or is not relieved by prescribed medicine . Lasting loss of appetite  or rapid weight loss of five pounds in a week . No bowel movement for 3 days or you feel uncomfortable . Diarrhea, 4 times in one day or diarrhea with lack of strength or a feeling of being dizzy . Pain that does not go away, especially in your abdomen, or is not relieved by prescribed medicines . Blood in your stool . Pain or burning when you pass urine . Difficulty urinating . Feeling like you have to pass urine often, but not much comes out when you do . Tender or heavy feeling in your lower abdomen . Cloudy urine and/or urine that smells bad . Pain on one side of your back under your ribs. This is where your kidneys are. . Decreased urine or very dark urine . Weight gain of 5 pounds in one week (fluid  retention) . Swelling of your legs, ankles and/or feet . Abnormal blood sugar . Unusual thirst, passing urine often, headache, sweating, shakiness, irritability . New rash and/or itching . Rash that is not relieved by prescribed medicines . Flu-like symptoms: fever, headache, muscle and joint aches, and fatigue (low energy, feeling weak) . Signs of liver problems: dark urine, pale bowel movements, bad stomach pain, feeling very tired and weak, unusual itching, or yellowing of the eyes or skin . Signs of infusion reaction: fever or shaking chills, flushing, facial swelling, feeling dizzy, headache, trouble breathing, rash, itching, chest tightness, or chest pain. If this happens, call 911 for emergency care. . If you think you are pregnant  Reproduction Warnings . Pregnancy warning: This drug can have harmful effects on the unborn baby. Women of childbearing potential should use effective methods of birth control during your cancer treatment and for at least 3 months after treatment. Let your doctor know right away if you think you may be pregnant. . Breastfeeding warning: Women should not breastfeed during treatment and for at least 3 months after treatment because this drug could enter the breast milk and cause harm to a breastfeeding baby. . Fertility warning: Human fertility studies have not been done with this drug. Talk with your doctor or nurse if you plan to have children. Ask for information on sperm or egg banking.  Self-Care Tips  Drink at least two to three quarts of fluid every 24 hours, unless you are instructed otherwise. You may be at risk of infection so try to avoid crowds or people with colds, and report fever or any other signs of infection immediately to your health care provider. Wash your hands often. To reduce nausea, take anti-nausea medication as prescribed by your doctor, and eat small, frequent meals. Follow regimen of anti-diarrhea medication as prescribed  by your health care professional. Eat foods that may help reduce diarrhea (see managing side effects - diarrhea). In general, drinking alcoholic beverages should be kept to a minimum or avoided completely. You should discuss this with your doctor. Get plenty of rest. Maintain good nutrition. Remain active as you are able. Gentle exercise is encouraged such as a daily walk.  If you experience symptoms or side effects, be sure to discuss them with your health care team. They can prescribe medications and/or offer other suggestions that are effective in managing such problems.

## 2019-02-02 NOTE — Progress Notes (Signed)
Chemotherapy education packet given and discussed with pt and his daughter Leighton Ruff in detail.  Discussed diagnosis and staging, tx regimen, and intent of tx.  Reviewed chemotherapy medications and side effects, as well as pre-medications.  Instructed on how to manage side effects at home, and when to call the clinic.  Importance of fever/chills discussed with pt and family. Discussed precautions to implement at home after receiving tx, as well as self care strategies. Phone numbers provided for clinic during regular working hours, also how to reach the clinic after hours and on weekends. Pt and family provided the opportunity to ask questions - all questions answered to pt's and family satisfaction.  Patient requests that he go home and discuss this with his wife.  He is still unsure if he wants to continue with treatments or just enjoy what time he has left.  I encouraged him to discuss it and to let us know what he decides and I let him know that we support his decision no matter which way he decides.  He advised that he will call us next week to cancel if he decides to do so.

## 2019-02-03 ENCOUNTER — Telehealth (HOSPITAL_COMMUNITY): Payer: Self-pay

## 2019-02-03 DIAGNOSIS — I2510 Atherosclerotic heart disease of native coronary artery without angina pectoris: Secondary | ICD-10-CM | POA: Diagnosis not present

## 2019-02-03 DIAGNOSIS — N179 Acute kidney failure, unspecified: Secondary | ICD-10-CM | POA: Diagnosis not present

## 2019-02-03 DIAGNOSIS — E7800 Pure hypercholesterolemia, unspecified: Secondary | ICD-10-CM | POA: Diagnosis not present

## 2019-02-03 DIAGNOSIS — K2100 Gastro-esophageal reflux disease with esophagitis, without bleeding: Secondary | ICD-10-CM | POA: Diagnosis not present

## 2019-02-03 DIAGNOSIS — C3491 Malignant neoplasm of unspecified part of right bronchus or lung: Secondary | ICD-10-CM | POA: Diagnosis not present

## 2019-02-03 DIAGNOSIS — T66XXXD Radiation sickness, unspecified, subsequent encounter: Secondary | ICD-10-CM | POA: Diagnosis not present

## 2019-02-03 DIAGNOSIS — Z452 Encounter for adjustment and management of vascular access device: Secondary | ICD-10-CM | POA: Diagnosis not present

## 2019-02-03 DIAGNOSIS — K5900 Constipation, unspecified: Secondary | ICD-10-CM | POA: Diagnosis not present

## 2019-02-03 DIAGNOSIS — E876 Hypokalemia: Secondary | ICD-10-CM | POA: Diagnosis not present

## 2019-02-03 DIAGNOSIS — E538 Deficiency of other specified B group vitamins: Secondary | ICD-10-CM | POA: Diagnosis not present

## 2019-02-03 DIAGNOSIS — I480 Paroxysmal atrial fibrillation: Secondary | ICD-10-CM | POA: Diagnosis not present

## 2019-02-03 DIAGNOSIS — Y842 Radiological procedure and radiotherapy as the cause of abnormal reaction of the patient, or of later complication, without mention of misadventure at the time of the procedure: Secondary | ICD-10-CM | POA: Diagnosis not present

## 2019-02-03 DIAGNOSIS — R1312 Dysphagia, oropharyngeal phase: Secondary | ICD-10-CM | POA: Diagnosis not present

## 2019-02-03 DIAGNOSIS — H409 Unspecified glaucoma: Secondary | ICD-10-CM | POA: Diagnosis not present

## 2019-02-03 DIAGNOSIS — I129 Hypertensive chronic kidney disease with stage 1 through stage 4 chronic kidney disease, or unspecified chronic kidney disease: Secondary | ICD-10-CM | POA: Diagnosis not present

## 2019-02-03 DIAGNOSIS — R339 Retention of urine, unspecified: Secondary | ICD-10-CM | POA: Diagnosis not present

## 2019-02-03 DIAGNOSIS — N182 Chronic kidney disease, stage 2 (mild): Secondary | ICD-10-CM | POA: Diagnosis not present

## 2019-02-03 DIAGNOSIS — Z9981 Dependence on supplemental oxygen: Secondary | ICD-10-CM | POA: Diagnosis not present

## 2019-02-03 DIAGNOSIS — E559 Vitamin D deficiency, unspecified: Secondary | ICD-10-CM | POA: Diagnosis not present

## 2019-02-03 NOTE — Telephone Encounter (Signed)
Nutrition Assessment   Reason for Assessment: Patient identified on Malnutrition Screening report for poor appetite and weight loss.   ASSESSMENT:  85 year old male with squamous cell carcinoma of left lung.  Patient received chemotherapy (last dose 12/05/2018) and radiation (last dose 12/09/2018).  Noted hospital admission on 10/12-10/26/2020 with severe radiation esophagitis and weakness. Patient was discharged to rehab.  Planning to start immunotherapy.    Spoke with patient via phone to introduce self and service at [facility].  Patient reports that appetite has increased over the last 3 weeks since being home from rehab.  Reports that hospital food and rehab food was "not home cooking."  Also reports that he was being sent foods that he could not eat due to his esophagitis.  He reports esophagitis is better and trying to eat 3 meals per day with snacks or oral nutrition supplements between meals.  Drinking ensure/boost/equate 2 working up to 3 per day.  Breakfast has been eggs, grits or oatmeal, ate pancakes this am.  Reports sausage biscuit this week did not go well.  Lunch is pimento cheese sandwich or something similar.  Supper is soup, mashed potatoes, meat that wife prepares.  Has been trying to eat ice cream and pudding too.      Nutrition Focused Physical Exam: deferred   Medications: Vit D, carafate, zofran, miralax, KCL, Vit B 12, protonix   Labs: reviewed   Anthropometrics:   Height: 73 inches Weight: 172 lb 3.2 UBW: 192 lb in 11/28/2018 BMI: 22  10% weight loss in the last 2 months, significant   Estimated Energy Needs  Kcals: 2300-2700 Protein: 115-135 g Fluid: > 2.3 L   NUTRITION DIAGNOSIS: Inadequate oral intake related to cancer related treatment side effects (radiation esophagitis) as evidenced by 10% weight loss in the last 2 months but this is improving   INTERVENTION:  Discussed strategies to increase calories and protein.  Will mail handout. Encouraged high  calorie shakes between meals (3 ideally).  Will mail coupons Discussed chopping, grinding foods and adding liquids if needed to help with swallowing.   Patient has contact information   MONITORING, EVALUATION, GOAL: Patient will tolerate increased calories and protein to prevent further weight loss   Next Visit: phone f/u Dec 18th  Zahriah Roes B. Zenia Resides, [place], [place] Registered Dietitian [phone] (pager)

## 2019-02-05 ENCOUNTER — Encounter (HOSPITAL_COMMUNITY): Payer: Self-pay | Admitting: Hematology

## 2019-02-05 NOTE — Assessment & Plan Note (Signed)
1.  [name] squamous cell carcinoma left lung: -PET scan on 10/17/2018 showed hypermetabolic left lower lobe nodule, left hilar and contralateral right lower paratracheal lymph node along with right hilar lymph nodes.  Hypermetabolic subcarinal lymph nodes present.  Hypermetabolic lymph node in the porta hepatis with SUV 4.9, measuring 1.1 cm, favoring reactive adenopathy. -26 pound weight loss in the last 1 year, ex-smoker quit in 1975. -Left lower lobe lung biopsy on 10/31/2018 consistent with squamous cell carcinoma. -Weekly carboplatin and paclitaxel from 11/21/2018 through 12/05/2018. -He had a prolonged hospitalization and developed radiation esophagitis.  He went to a nursing home after that. -His energy levels are coming back. -I will plan to repeat CT scan of the chest in 2 weeks.  We will make addition of whether to restart treatment at that time.  He does not want to do any radiation.   2.  Paroxysmal atrial fibrillation: - He was hospitalized for hemoptysis from 11/16/2026 through 11/19/2018. -We are holding Xarelto at this time.  No further hemoptysis noted.  3.  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  He had a pacemaker in November 2018. - Plavix is on hold since hemoptysis.  He will continue aspirin 81 mg daily.  4.  Weight loss: -Weight is stable.  He is drinking Ensure 3 cans/day. -We will closely watch his weight.

## 2019-02-06 DIAGNOSIS — N179 Acute kidney failure, unspecified: Secondary | ICD-10-CM | POA: Diagnosis not present

## 2019-02-06 DIAGNOSIS — Z9981 Dependence on supplemental oxygen: Secondary | ICD-10-CM | POA: Diagnosis not present

## 2019-02-06 DIAGNOSIS — Z452 Encounter for adjustment and management of vascular access device: Secondary | ICD-10-CM | POA: Diagnosis not present

## 2019-02-06 DIAGNOSIS — T66XXXD Radiation sickness, unspecified, subsequent encounter: Secondary | ICD-10-CM | POA: Diagnosis not present

## 2019-02-06 DIAGNOSIS — R339 Retention of urine, unspecified: Secondary | ICD-10-CM | POA: Diagnosis not present

## 2019-02-06 DIAGNOSIS — N182 Chronic kidney disease, stage 2 (mild): Secondary | ICD-10-CM | POA: Diagnosis not present

## 2019-02-06 DIAGNOSIS — Y842 Radiological procedure and radiotherapy as the cause of abnormal reaction of the patient, or of later complication, without mention of misadventure at the time of the procedure: Secondary | ICD-10-CM | POA: Diagnosis not present

## 2019-02-06 DIAGNOSIS — E7800 Pure hypercholesterolemia, unspecified: Secondary | ICD-10-CM | POA: Diagnosis not present

## 2019-02-06 DIAGNOSIS — I480 Paroxysmal atrial fibrillation: Secondary | ICD-10-CM | POA: Diagnosis not present

## 2019-02-06 DIAGNOSIS — E559 Vitamin D deficiency, unspecified: Secondary | ICD-10-CM | POA: Diagnosis not present

## 2019-02-06 DIAGNOSIS — E876 Hypokalemia: Secondary | ICD-10-CM | POA: Diagnosis not present

## 2019-02-06 DIAGNOSIS — I129 Hypertensive chronic kidney disease with stage 1 through stage 4 chronic kidney disease, or unspecified chronic kidney disease: Secondary | ICD-10-CM | POA: Diagnosis not present

## 2019-02-06 DIAGNOSIS — K5900 Constipation, unspecified: Secondary | ICD-10-CM | POA: Diagnosis not present

## 2019-02-06 DIAGNOSIS — H409 Unspecified glaucoma: Secondary | ICD-10-CM | POA: Diagnosis not present

## 2019-02-06 DIAGNOSIS — C3491 Malignant neoplasm of unspecified part of right bronchus or lung: Secondary | ICD-10-CM | POA: Diagnosis not present

## 2019-02-06 DIAGNOSIS — E538 Deficiency of other specified B group vitamins: Secondary | ICD-10-CM | POA: Diagnosis not present

## 2019-02-06 DIAGNOSIS — K2100 Gastro-esophageal reflux disease with esophagitis, without bleeding: Secondary | ICD-10-CM | POA: Diagnosis not present

## 2019-02-06 DIAGNOSIS — I2510 Atherosclerotic heart disease of native coronary artery without angina pectoris: Secondary | ICD-10-CM | POA: Diagnosis not present

## 2019-02-06 DIAGNOSIS — R1312 Dysphagia, oropharyngeal phase: Secondary | ICD-10-CM | POA: Diagnosis not present

## 2019-02-07 ENCOUNTER — Inpatient Hospital Stay (HOSPITAL_COMMUNITY): Payer: Medicare Other

## 2019-02-07 ENCOUNTER — Other Ambulatory Visit: Payer: Self-pay

## 2019-02-07 ENCOUNTER — Encounter (HOSPITAL_COMMUNITY): Payer: Self-pay

## 2019-02-07 VITALS — BP 116/58 | HR 70 | Temp 98.0°F | Resp 18 | Wt 170.8 lb

## 2019-02-07 DIAGNOSIS — Z79899 Other long term (current) drug therapy: Secondary | ICD-10-CM | POA: Diagnosis not present

## 2019-02-07 DIAGNOSIS — Z923 Personal history of irradiation: Secondary | ICD-10-CM | POA: Diagnosis not present

## 2019-02-07 DIAGNOSIS — C3490 Malignant neoplasm of unspecified part of unspecified bronchus or lung: Secondary | ICD-10-CM

## 2019-02-07 DIAGNOSIS — C3492 Malignant neoplasm of unspecified part of left bronchus or lung: Secondary | ICD-10-CM

## 2019-02-07 DIAGNOSIS — Z7902 Long term (current) use of antithrombotics/antiplatelets: Secondary | ICD-10-CM | POA: Diagnosis not present

## 2019-02-07 DIAGNOSIS — C3432 Malignant neoplasm of lower lobe, left bronchus or lung: Secondary | ICD-10-CM | POA: Diagnosis not present

## 2019-02-07 DIAGNOSIS — I480 Paroxysmal atrial fibrillation: Secondary | ICD-10-CM | POA: Diagnosis not present

## 2019-02-07 DIAGNOSIS — Z95828 Presence of other vascular implants and grafts: Secondary | ICD-10-CM

## 2019-02-07 DIAGNOSIS — Z5111 Encounter for antineoplastic chemotherapy: Secondary | ICD-10-CM | POA: Diagnosis not present

## 2019-02-07 LAB — CBC WITH DIFFERENTIAL/PLATELET
Abs Immature Granulocytes: 0.01 10*3/uL (ref 0.00–0.07)
Basophils Absolute: 0 10*3/uL (ref 0.0–0.1)
Basophils Relative: 1 %
Eosinophils Absolute: 0.1 10*3/uL (ref 0.0–0.5)
Eosinophils Relative: 2 %
HCT: 31.4 % — ABNORMAL LOW (ref 39.0–52.0)
Hemoglobin: 9.8 g/dL — ABNORMAL LOW (ref 13.0–17.0)
Immature Granulocytes: 0 %
Lymphocytes Relative: 8 %
Lymphs Abs: 0.4 10*3/uL — ABNORMAL LOW (ref 0.7–4.0)
MCH: 35.3 pg — ABNORMAL HIGH (ref 26.0–34.0)
MCHC: 31.2 g/dL (ref 30.0–36.0)
MCV: 112.9 fL — ABNORMAL HIGH (ref 80.0–100.0)
Monocytes Absolute: 0.3 10*3/uL (ref 0.1–1.0)
Monocytes Relative: 7 %
Neutro Abs: 3.5 10*3/uL (ref 1.7–7.7)
Neutrophils Relative %: 82 %
Platelets: 199 10*3/uL (ref 150–400)
RBC: 2.78 MIL/uL — ABNORMAL LOW (ref 4.22–5.81)
RDW: 15.9 % — ABNORMAL HIGH (ref 11.5–15.5)
WBC: 4.3 10*3/uL (ref 4.0–10.5)
nRBC: 0 % (ref 0.0–0.2)

## 2019-02-07 LAB — COMPREHENSIVE METABOLIC PANEL
ALT: 12 U/L (ref 0–44)
AST: 15 U/L (ref 15–41)
Albumin: 3.4 g/dL — ABNORMAL LOW (ref 3.5–5.0)
Alkaline Phosphatase: 66 U/L (ref 38–126)
Anion gap: 10 (ref 5–15)
BUN: 16 mg/dL (ref 8–23)
CO2: 26 mmol/L (ref 22–32)
Calcium: 8.7 mg/dL — ABNORMAL LOW (ref 8.9–10.3)
Chloride: 99 mmol/L (ref 98–111)
Creatinine, Ser: 0.8 mg/dL (ref 0.61–1.24)
GFR calc Af Amer: >60 mL/min (ref >60)
GFR calc non Af Amer: >60 mL/min (ref >60)
Glucose, Bld: 156 mg/dL — ABNORMAL HIGH (ref 70–99)
Potassium: 3.4 mmol/L — ABNORMAL LOW (ref 3.5–5.1)
Sodium: 135 mmol/L (ref 135–145)
Total Bilirubin: 0.9 mg/dL (ref 0.3–1.2)
Total Protein: 6.7 g/dL (ref 6.5–8.1)

## 2019-02-07 LAB — TSH: TSH: 1.87 u[IU]/mL (ref 0.350–4.500)

## 2019-02-07 MED ORDER — SODIUM CHLORIDE 0.9% FLUSH
10.0000 mL | INTRAVENOUS | Status: DC | PRN
  Administered 2019-02-07: 08:00:00 10 mL
  Filled 2019-02-07: qty 10

## 2019-02-07 MED ORDER — SODIUM CHLORIDE 0.9 % IV SOLN
Freq: Once | INTRAVENOUS | Status: AC
  Administered 2019-02-07: 11:00:00 via INTRAVENOUS

## 2019-02-07 MED ORDER — HEPARIN SOD (PORK) LOCK FLUSH 100 UNIT/ML IV SOLN
500.0000 [IU] | Freq: Once | INTRAVENOUS | Status: AC | PRN
  Administered 2019-02-07: 13:00:00 500 [IU]

## 2019-02-07 MED ORDER — SODIUM CHLORIDE 0.9 % IV SOLN
9.6000 mg/kg | Freq: Once | INTRAVENOUS | Status: AC
  Administered 2019-02-07: 11:00:00 740 mg via INTRAVENOUS
  Filled 2019-02-07: qty 10

## 2019-02-07 NOTE — Patient Instructions (Signed)
[facility] Discharge Instructions for Patients Receiving Chemotherapy  Today you received the following chemotherapy agents   To help prevent nausea and vomiting after your treatment, we encourage you to take your nausea medication   If you develop nausea and vomiting that is not controlled by your nausea medication, call the clinic.   BELOW ARE SYMPTOMS THAT SHOULD BE REPORTED IMMEDIATELY:  *FEVER GREATER THAN 100.5 F  *CHILLS WITH OR WITHOUT FEVER  NAUSEA AND VOMITING THAT IS NOT CONTROLLED WITH YOUR NAUSEA MEDICATION  *UNUSUAL SHORTNESS OF BREATH  *UNUSUAL BRUISING OR BLEEDING  TENDERNESS IN MOUTH AND THROAT WITH OR WITHOUT PRESENCE OF ULCERS  *URINARY PROBLEMS  *BOWEL PROBLEMS  UNUSUAL RASH Items with * indicate a potential emergency and should be followed up as soon as possible.  Feel free to call the clinic should you have any questions or concerns. The clinic phone number is [phone].  Please show the CHEMO ALERT CARD at check-in to the Emergency Department and triage nurse.   

## 2019-02-07 NOTE — Progress Notes (Signed)
Labs meet parameters today for treatment. Will obtain consent today for Imfinzi. Patient wants to try this treatment once or twice and see how he does.   Treatment given per orders. Patient tolerated it well without problems. Vitals stable and discharged home from clinic via wheelchair. Follow up as scheduled.

## 2019-02-08 ENCOUNTER — Telehealth (HOSPITAL_COMMUNITY): Payer: Self-pay

## 2019-02-08 DIAGNOSIS — R339 Retention of urine, unspecified: Secondary | ICD-10-CM | POA: Diagnosis not present

## 2019-02-08 DIAGNOSIS — N182 Chronic kidney disease, stage 2 (mild): Secondary | ICD-10-CM | POA: Diagnosis not present

## 2019-02-08 DIAGNOSIS — I129 Hypertensive chronic kidney disease with stage 1 through stage 4 chronic kidney disease, or unspecified chronic kidney disease: Secondary | ICD-10-CM | POA: Diagnosis not present

## 2019-02-08 DIAGNOSIS — E7800 Pure hypercholesterolemia, unspecified: Secondary | ICD-10-CM | POA: Diagnosis not present

## 2019-02-08 DIAGNOSIS — Z452 Encounter for adjustment and management of vascular access device: Secondary | ICD-10-CM | POA: Diagnosis not present

## 2019-02-08 DIAGNOSIS — E876 Hypokalemia: Secondary | ICD-10-CM | POA: Diagnosis not present

## 2019-02-08 DIAGNOSIS — N179 Acute kidney failure, unspecified: Secondary | ICD-10-CM | POA: Diagnosis not present

## 2019-02-08 DIAGNOSIS — K5900 Constipation, unspecified: Secondary | ICD-10-CM | POA: Diagnosis not present

## 2019-02-08 DIAGNOSIS — I480 Paroxysmal atrial fibrillation: Secondary | ICD-10-CM | POA: Diagnosis not present

## 2019-02-08 DIAGNOSIS — Y842 Radiological procedure and radiotherapy as the cause of abnormal reaction of the patient, or of later complication, without mention of misadventure at the time of the procedure: Secondary | ICD-10-CM | POA: Diagnosis not present

## 2019-02-08 DIAGNOSIS — R1312 Dysphagia, oropharyngeal phase: Secondary | ICD-10-CM | POA: Diagnosis not present

## 2019-02-08 DIAGNOSIS — E538 Deficiency of other specified B group vitamins: Secondary | ICD-10-CM | POA: Diagnosis not present

## 2019-02-08 DIAGNOSIS — T66XXXD Radiation sickness, unspecified, subsequent encounter: Secondary | ICD-10-CM | POA: Diagnosis not present

## 2019-02-08 DIAGNOSIS — Z9981 Dependence on supplemental oxygen: Secondary | ICD-10-CM | POA: Diagnosis not present

## 2019-02-08 DIAGNOSIS — E559 Vitamin D deficiency, unspecified: Secondary | ICD-10-CM | POA: Diagnosis not present

## 2019-02-08 DIAGNOSIS — C3491 Malignant neoplasm of unspecified part of right bronchus or lung: Secondary | ICD-10-CM | POA: Diagnosis not present

## 2019-02-08 DIAGNOSIS — H409 Unspecified glaucoma: Secondary | ICD-10-CM | POA: Diagnosis not present

## 2019-02-08 DIAGNOSIS — K2100 Gastro-esophageal reflux disease with esophagitis, without bleeding: Secondary | ICD-10-CM | POA: Diagnosis not present

## 2019-02-08 DIAGNOSIS — I2510 Atherosclerotic heart disease of native coronary artery without angina pectoris: Secondary | ICD-10-CM | POA: Diagnosis not present

## 2019-02-08 NOTE — Telephone Encounter (Signed)
24 hour follow up call- patient states he is doing well today. Up eating breakfast and feels good. Encouraged him to call if he had any concerns or questions before his next appointment!!

## 2019-02-10 ENCOUNTER — Telehealth: Payer: Self-pay | Admitting: Cardiovascular Disease

## 2019-02-10 NOTE — Telephone Encounter (Signed)

## 2019-02-13 ENCOUNTER — Telehealth (INDEPENDENT_AMBULATORY_CARE_PROVIDER_SITE_OTHER): Payer: Medicare Other | Admitting: Cardiovascular Disease

## 2019-02-13 ENCOUNTER — Telehealth: Payer: Self-pay | Admitting: Licensed Clinical Social Worker

## 2019-02-13 ENCOUNTER — Encounter: Payer: Self-pay | Admitting: Cardiovascular Disease

## 2019-02-13 VITALS — BP 114/60 | HR 72

## 2019-02-13 DIAGNOSIS — I252 Old myocardial infarction: Secondary | ICD-10-CM

## 2019-02-13 DIAGNOSIS — C3491 Malignant neoplasm of unspecified part of right bronchus or lung: Secondary | ICD-10-CM | POA: Diagnosis not present

## 2019-02-13 DIAGNOSIS — Z950 Presence of cardiac pacemaker: Secondary | ICD-10-CM

## 2019-02-13 DIAGNOSIS — E876 Hypokalemia: Secondary | ICD-10-CM | POA: Diagnosis not present

## 2019-02-13 DIAGNOSIS — E7800 Pure hypercholesterolemia, unspecified: Secondary | ICD-10-CM | POA: Diagnosis not present

## 2019-02-13 DIAGNOSIS — K2100 Gastro-esophageal reflux disease with esophagitis, without bleeding: Secondary | ICD-10-CM | POA: Diagnosis not present

## 2019-02-13 DIAGNOSIS — I480 Paroxysmal atrial fibrillation: Secondary | ICD-10-CM | POA: Diagnosis not present

## 2019-02-13 DIAGNOSIS — K5900 Constipation, unspecified: Secondary | ICD-10-CM | POA: Diagnosis not present

## 2019-02-13 DIAGNOSIS — I25118 Atherosclerotic heart disease of native coronary artery with other forms of angina pectoris: Secondary | ICD-10-CM | POA: Diagnosis not present

## 2019-02-13 DIAGNOSIS — Y842 Radiological procedure and radiotherapy as the cause of abnormal reaction of the patient, or of later complication, without mention of misadventure at the time of the procedure: Secondary | ICD-10-CM | POA: Diagnosis not present

## 2019-02-13 DIAGNOSIS — E538 Deficiency of other specified B group vitamins: Secondary | ICD-10-CM | POA: Diagnosis not present

## 2019-02-13 DIAGNOSIS — N182 Chronic kidney disease, stage 2 (mild): Secondary | ICD-10-CM | POA: Diagnosis not present

## 2019-02-13 DIAGNOSIS — Z9981 Dependence on supplemental oxygen: Secondary | ICD-10-CM | POA: Diagnosis not present

## 2019-02-13 DIAGNOSIS — I5022 Chronic systolic (congestive) heart failure: Secondary | ICD-10-CM

## 2019-02-13 DIAGNOSIS — H409 Unspecified glaucoma: Secondary | ICD-10-CM | POA: Diagnosis not present

## 2019-02-13 DIAGNOSIS — R339 Retention of urine, unspecified: Secondary | ICD-10-CM | POA: Diagnosis not present

## 2019-02-13 DIAGNOSIS — E785 Hyperlipidemia, unspecified: Secondary | ICD-10-CM

## 2019-02-13 DIAGNOSIS — N179 Acute kidney failure, unspecified: Secondary | ICD-10-CM | POA: Diagnosis not present

## 2019-02-13 DIAGNOSIS — I10 Essential (primary) hypertension: Secondary | ICD-10-CM

## 2019-02-13 DIAGNOSIS — I129 Hypertensive chronic kidney disease with stage 1 through stage 4 chronic kidney disease, or unspecified chronic kidney disease: Secondary | ICD-10-CM | POA: Diagnosis not present

## 2019-02-13 DIAGNOSIS — R1312 Dysphagia, oropharyngeal phase: Secondary | ICD-10-CM | POA: Diagnosis not present

## 2019-02-13 DIAGNOSIS — Z452 Encounter for adjustment and management of vascular access device: Secondary | ICD-10-CM | POA: Diagnosis not present

## 2019-02-13 DIAGNOSIS — Z955 Presence of coronary angioplasty implant and graft: Secondary | ICD-10-CM

## 2019-02-13 DIAGNOSIS — T66XXXD Radiation sickness, unspecified, subsequent encounter: Secondary | ICD-10-CM | POA: Diagnosis not present

## 2019-02-13 DIAGNOSIS — E559 Vitamin D deficiency, unspecified: Secondary | ICD-10-CM | POA: Diagnosis not present

## 2019-02-13 DIAGNOSIS — I442 Atrioventricular block, complete: Secondary | ICD-10-CM

## 2019-02-13 DIAGNOSIS — I2510 Atherosclerotic heart disease of native coronary artery without angina pectoris: Secondary | ICD-10-CM | POA: Diagnosis not present

## 2019-02-13 NOTE — Telephone Encounter (Signed)
CSW referred to assist patient with obtaining a BP cuff. CSW contacted patient to inform cuff will be delivered to home. Patient grateful for support and assistance. CSW available as needed. Jackie Alyn Riedinger, LCSW, CCSW-MCS [phone]  

## 2019-02-13 NOTE — Progress Notes (Signed)
Virtual Visit via Telephone Note   This visit type was conducted due to national recommendations for restrictions regarding the COVID-19 Pandemic (e.g. social distancing) in an effort to limit this patient's exposure and mitigate transmission in our community.  Due to his co-morbid illnesses, this patient is at least at moderate risk for complications without adequate follow up.  This format is felt to be most appropriate for this patient at this time.  The patient did not have access to video technology/had technical difficulties with video requiring transitioning to audio format only (telephone).  All issues noted in this document were discussed and addressed.  No physical exam could be performed with this format.  Please refer to the patient's chart for his  consent to telehealth for [facility].   Date:  02/13/2019   ID:  Nicholas Sanchez, DOB 1933/12/10, MRN [id number]  Patient Location: Home Provider Location: Office  PCP:  Sinda Du, MD  Cardiologist:  Kate Sable, MD  Electrophysiologist:  Cristopher Peru, MD   Evaluation Performed:  Follow-Up Visit  Chief Complaint:  CAD, PAF, CHF  History of Present Illness:    Nicholas Sanchez is a 85 y.o. male with a history of coronary artery disease with multivessel PCI. Most recently, he had a non-STEMI in August 2019 and ultimately underwent PCI of the proximal and mid RCA with 2 drug-eluting stents. He also has paroxysmal atrial fibrillation.  He has an ischemic cardiomyopathy and sick sinus syndrome with a biventricular pacemaker and follows with Dr. Lovena Le for this.  He is also treated for lung cancer. He is currently on consolidation immunotherapy.  He denies chest pain and palpitations.  Says his appetite is good.   Past Medical History:  Diagnosis Date  . Atherosclerotic heart disease of native coronary artery without angina pectoris   . Bradycardia, unspecified   . CAD (coronary artery disease)    a. STEMI  04/2008 s/p BMS to prox & distal RCA, staged DES to LAD same admission. b. Nuc 03/2017: scar but no ischemia, EF 45-54%. c. 09/2017: NSTEMI - DES x 2 to proximal and mid-RCA  . Cancer ([place])   . Chronic anticoagulation   . Chronic atrial fibrillation ([place])   . Chronic systolic (congestive) heart failure ([place])   . CKD (chronic kidney disease), stage II   . Cough, persistent   . GERD (gastroesophageal reflux disease)   . History of BPH   . HTN (hypertension)   . Hypercholesteremia   . LV dysfunction    a. EF 45-50% in 01/2016.  . Malignant neoplasm of unspecified part of unspecified bronchus or lung ([place])   . Mild pulmonary hypertension ([place])   . Obesity, unspecified   . Other esophagitis without bleeding   . PAF (paroxysmal atrial fibrillation) ([place])   . Pneumonia, unspecified organism   . Port-A-Cath in place 11/21/2018  . Presence of cardiac pacemaker   . Presence of permanent cardiac pacemaker   . Sinus node dysfunction ([place])    a. s/p StJude CRT-pacemaker 01/2016.  [name] Tinnitus, bilateral   . Unspecified glaucoma    Past Surgical History:  Procedure Laterality Date  . BIOPSY  12/15/2018   Procedure: BIOPSY;  Surgeon: [name], MD;  [facility];  Service: Endoscopy;;  esophageal  . CARDIAC CATHETERIZATION    . COLONOSCOPY    . COLONOSCOPY N/A 08/27/2017   Dr. [name]: 25 5-25 mm polyps in descending colon, ascending, cecum. 2X3 cm carpet polyp in base of cecum  lifted away s/p piecemeal polypectomy. Tubulovillous adenoma and tubular adenomas  . CORONARY STENT INTERVENTION N/A 10/22/2017   Procedure: CORONARY STENT INTERVENTION;  Surgeon: [name], MD;  Location: [place] CV LAB;  Service: Cardiovascular;  Laterality: N/A;  . CORONARY STENT PLACEMENT  04/2008   RCA and LAD  . EP IMPLANTABLE DEVICE N/A 01/09/2016   Procedure: BiV Pacemaker Insertion CRT-P;  Surgeon: [name], MD;  Location: [place] [facility];  Service: Cardiovascular;  Laterality: N/A;   . ESOPHAGOGASTRODUODENOSCOPY N/A 08/27/2017   normal esophagus s/p dilation, gastric petechia, small hiatal hernia, normal duodenum  . ESOPHAGOGASTRODUODENOSCOPY (EGD) WITH PROPOFOL N/A 12/15/2018   Procedure: ESOPHAGOGASTRODUODENOSCOPY (EGD) WITH PROPOFOL;  Surgeon: [name], MD;  [facility];  Service: Endoscopy;  Laterality: N/A;  . INSERT / REPLACE / REMOVE PACEMAKER    . MALONEY DILATION N/A 08/27/2017   Procedure: [name] DILATION;  Surgeon: [name], MD;  [facility];  Service: Endoscopy;  Laterality: N/A;  . PORTACATH PLACEMENT Right 11/18/2018   Procedure: INSERTION PORT-A-CATH;  Surgeon: [name], MD;  Location: [facility];  Service: General;  Laterality: Right;  . RIGHT/LEFT HEART CATH AND CORONARY ANGIOGRAPHY N/A 10/21/2017   Procedure: RIGHT/LEFT HEART CATH AND CORONARY ANGIOGRAPHY;  Surgeon: [name], MD;  Location: [place] [facility];  Service: Cardiovascular;  Laterality: N/A;     Current Meds  Medication Sig  . acetaminophen (TYLENOL) 500 MG tablet Take 500 mg by mouth every 6 (six) hours as needed for pain.  [name] aspirin EC 81 MG tablet Take 81 mg by mouth daily.  . cholecalciferol (VITAMIN D) 1000 units tablet Take 1,000 Units by mouth 2 (two) times daily.  . dorzolamide-timolol (COSOPT) 22.3-6.8 MG/ML ophthalmic solution Place 1 drop into the left eye 2 (two) times daily.  . hydrochlorothiazide (HYDRODIURIL) 25 MG tablet Take 25 mg by mouth daily.  . metoprolol succinate (TOPROL-XL) 50 MG 24 hr tablet Take 1 tablet (50 mg total) by mouth daily. Take with or immediately following a meal.  . nitroGLYCERIN (NITROSTAT) 0.4 MG SL tablet Place 0.4 mg under the tongue every 5 (five) minutes as needed for chest pain.  . pantoprazole (PROTONIX) 40 MG tablet Take 1 tablet (40 mg total) by mouth daily before breakfast.  . potassium chloride SA (KLOR-CON) 20 MEQ tablet Take 2 tablets (40 mEq total) by mouth 2 (two) times daily. For 2 days  .  sucralfate (CARAFATE) 1 GM/10ML suspension Take 10 mLs (1 g total) by mouth 4 (four) times daily -  with meals and at bedtime.  . tamsulosin (FLOMAX) 0.4 MG CAPS capsule Take 0.4 mg by mouth daily.  . vitamin B-12 (CYANOCOBALAMIN) 1000 MCG tablet Take 1,000 mcg by mouth daily.     Allergies:   Ace inhibitors and Tape   Social History   Tobacco Use  . Smoking status: Former Smoker    Packs/day: 2.50    Years: 25.00    Pack years: 62.50    Types: Cigarettes    Quit date: 06/14/1973    Years since quitting: 45.6  . Smokeless tobacco: Former Systems developer    Types: Chew    Quit date: 06/14/1973  . Tobacco comment: some/chewed very little for about 2 years  Substance Use Topics  . Alcohol use: No  . Drug use: No     Family Hx: The patient's family history includes Cancer in his mother; Emphysema in his mother; Heart disease in his brother; Hypothyroidism in  his daughter and daughter; Prostate cancer in his brother. There is no history of Colon cancer or Colon polyps.  ROS:   Please see the history of present illness.     All other systems reviewed and are negative.   Prior CV studies:   The following studies were reviewed today:  Diagnostic coronary angiography (10/21/17):   Diffuse in-stent restenosis within the ostial to proximal RCA stent. High-grade mid RCA stenosis within a region of tortuosity. Distal RCA is large.  Normal left main.  Large widely patent LAD including proximal stent.  Large normal ramus  Relatively small normal circumflex.  LVEF estimated 30 to 35%. LVEDP 17 mmHg  RECOMMENDATIONS:   Given reduced LV function, anterior origin of the right coronary from the right sinus of Valsalva, diffuse calcified in-stent restenosis in the ostial to proximal RCA, and high-grade potentially calcified stenosis in the mid vessel within angulated segment, PCI will be high risk. Recommend femoral approach. Discussed with Dr. Burt Knack.  Loaded with Plavix.  Patient  is on chronic anticoagulation therapy.  Cath 10/22/17 (PCI):  Successful orbital atherectomy, noncompliant balloon angioplasty, Cutting Balloon angioplasty, and stenting of severe stenosis in the mid RCA and severe in-stent restenosis in the proximal and ostial RCA using 2 DES platforms (3.5x15 mm Orsiro in the mid vessel and 3.5x18 mm Orsiro in the proximal vessel)  Recommend to resume Rivaroxaban, at currently prescribed dose and frequency, on 10/23/2017. Recommend concurrent antiplatelet therapy of Aspirin 81mg  daily for 1 monthand Clopidogrel 75mg  daily for 6 months.  Labs/Other Tests and Data Reviewed:    EKG:  No ECG reviewed.  Recent Labs: 12/18/2018: Magnesium 2.0 02/07/2019: ALT 12; BUN 16; Creatinine, Ser 0.80; Hemoglobin 9.8; Platelets 199; Potassium 3.4; Sodium 135; TSH 1.870   Recent Lipid Panel Lab Results  Component Value Date/Time   CHOL 104 03/01/2017 10:04 AM   TRIG 54 03/01/2017 10:04 AM   HDL 33 (L) 03/01/2017 10:04 AM   CHOLHDL 3.2 03/01/2017 10:04 AM   LDLCALC 58 03/01/2017 10:04 AM    Wt Readings from Last 3 Encounters:  02/07/19 170 lb 12.8 oz (77.5 kg)  01/30/19 172 lb 3.2 oz (78.1 kg)  01/09/19 188 lb 8 oz (85.5 kg)     Objective:    Vital Signs:  BP 114/60   Pulse 72    VITAL SIGNS:  reviewed  ASSESSMENT & PLAN:    1. Coronary artery disease: Symptomatically stable. He has a history of multivessel PCI with most recent intervention in August 2019 for a non-STEMI with 2 drug-eluting stents placed to the proximal and mid RCA.  Continue ASA 81 mg and beta-blocker.  No longer on statin for unclear reasons.  2. Ischemic cardiomyopathy/chronic systolic heart failure: LVEF 40 to 45% by echocardiogram in August 2019. NYHA class II symptoms. No longer on losartan.  Continue Toprol-XL.  3. Sick sinus syndrome: Biventricular pacemaker in November 2017. Normal device function. Follows with Dr. Lovena Le.  4. Paroxysmal atrial fibrillation:   Xarelto on hold as per oncology.  Currently on aspirin.  Continue Toprol-XL.  5. Hypertension: BP is normal. No changes.  6. Hyperlipidemia: No longer on statin.   COVID-19 Education: The signs and symptoms of COVID-19 were discussed with the patient and how to seek care for testing (follow up with PCP or arrange E-visit).  The importance of social distancing was discussed today.  Time:   Today, I have spent 10 minutes with the patient with telehealth technology discussing the above problems.  Medication Adjustments/Labs and Tests Ordered: Current medicines are reviewed at length with the patient today.  Concerns regarding medicines are outlined above.   Tests Ordered: No orders of the defined types were placed in this encounter.   Medication Changes: No orders of the defined types were placed in this encounter.   Follow Up:  Virtual Visit  in 6 month(s)  Signed, Kate Sable, MD  02/13/2019 10:36 AM    [place]

## 2019-02-13 NOTE — Progress Notes (Signed)
Remote pacemaker transmission.   

## 2019-02-13 NOTE — Patient Instructions (Signed)
Medication Instructions:  Your physician recommends that you continue on your current medications as directed. Please refer to the Current Medication list given to you today.  *If you need a refill on your cardiac medications before your next appointment, please call your pharmacy*  Lab Work: None today If you have labs (blood work) drawn today and your tests are completely normal, you will receive your results only by: [name] MyChart Message (if you have MyChart) OR . A paper copy in the mail If you have any lab test that is abnormal or we need to change your treatment, we will call you to review the results.  Testing/Procedures: None today  Follow-Up: At [facility], you and your health needs are our priority.  As part of our continuing mission to provide you with exceptional heart care, we have created designated Provider Care Teams.  These Care Teams include your primary Cardiologist (physician) and Advanced Practice Providers (APPs -  Physician Assistants and Nurse Practitioners) who all work together to provide you with the care you need, when you need it.  Your next appointment:   6 month(s)  The format for your next appointment:   Virtual Visit   Provider:   Kate Sable, MD  Other Instructions None     Thank you for choosing [facility] !

## 2019-02-14 ENCOUNTER — Other Ambulatory Visit: Payer: Self-pay

## 2019-02-14 ENCOUNTER — Encounter: Payer: Self-pay | Admitting: Internal Medicine

## 2019-02-14 ENCOUNTER — Encounter (HOSPITAL_COMMUNITY): Payer: Self-pay | Admitting: Hematology

## 2019-02-14 ENCOUNTER — Ambulatory Visit (INDEPENDENT_AMBULATORY_CARE_PROVIDER_SITE_OTHER): Payer: Medicare Other | Admitting: Internal Medicine

## 2019-02-14 ENCOUNTER — Inpatient Hospital Stay (HOSPITAL_BASED_OUTPATIENT_CLINIC_OR_DEPARTMENT_OTHER): Payer: Medicare Other | Admitting: Hematology

## 2019-02-14 ENCOUNTER — Inpatient Hospital Stay (HOSPITAL_COMMUNITY): Payer: Medicare Other

## 2019-02-14 VITALS — BP 121/58 | HR 66 | Temp 97.1°F | Ht 73.0 in | Wt 164.0 lb

## 2019-02-14 DIAGNOSIS — I442 Atrioventricular block, complete: Secondary | ICD-10-CM | POA: Diagnosis not present

## 2019-02-14 DIAGNOSIS — C3492 Malignant neoplasm of unspecified part of left bronchus or lung: Secondary | ICD-10-CM

## 2019-02-14 DIAGNOSIS — Z7902 Long term (current) use of antithrombotics/antiplatelets: Secondary | ICD-10-CM | POA: Diagnosis not present

## 2019-02-14 DIAGNOSIS — I480 Paroxysmal atrial fibrillation: Secondary | ICD-10-CM | POA: Diagnosis not present

## 2019-02-14 DIAGNOSIS — C3490 Malignant neoplasm of unspecified part of unspecified bronchus or lung: Secondary | ICD-10-CM

## 2019-02-14 DIAGNOSIS — Z923 Personal history of irradiation: Secondary | ICD-10-CM | POA: Diagnosis not present

## 2019-02-14 DIAGNOSIS — Z79899 Other long term (current) drug therapy: Secondary | ICD-10-CM | POA: Diagnosis not present

## 2019-02-14 DIAGNOSIS — I482 Chronic atrial fibrillation, unspecified: Secondary | ICD-10-CM | POA: Diagnosis not present

## 2019-02-14 DIAGNOSIS — C3432 Malignant neoplasm of lower lobe, left bronchus or lung: Secondary | ICD-10-CM | POA: Diagnosis not present

## 2019-02-14 DIAGNOSIS — Z5111 Encounter for antineoplastic chemotherapy: Secondary | ICD-10-CM | POA: Diagnosis not present

## 2019-02-14 LAB — CBC WITH DIFFERENTIAL/PLATELET
Abs Immature Granulocytes: 0.01 10*3/uL (ref 0.00–0.07)
Basophils Absolute: 0 10*3/uL (ref 0.0–0.1)
Basophils Relative: 1 %
Eosinophils Absolute: 0.1 10*3/uL (ref 0.0–0.5)
Eosinophils Relative: 1 %
HCT: 33.1 % — ABNORMAL LOW (ref 39.0–52.0)
Hemoglobin: 10.3 g/dL — ABNORMAL LOW (ref 13.0–17.0)
Immature Granulocytes: 0 %
Lymphocytes Relative: 7 %
Lymphs Abs: 0.4 10*3/uL — ABNORMAL LOW (ref 0.7–4.0)
MCH: 34.3 pg — ABNORMAL HIGH (ref 26.0–34.0)
MCHC: 31.1 g/dL (ref 30.0–36.0)
MCV: 110.3 fL — ABNORMAL HIGH (ref 80.0–100.0)
Monocytes Absolute: 0.5 10*3/uL (ref 0.1–1.0)
Monocytes Relative: 9 %
Neutro Abs: 4.6 10*3/uL (ref 1.7–7.7)
Neutrophils Relative %: 82 %
Platelets: 231 10*3/uL (ref 150–400)
RBC: 3 MIL/uL — ABNORMAL LOW (ref 4.22–5.81)
RDW: 14.5 % (ref 11.5–15.5)
WBC: 5.6 10*3/uL (ref 4.0–10.5)
nRBC: 0 % (ref 0.0–0.2)

## 2019-02-14 LAB — COMPREHENSIVE METABOLIC PANEL
ALT: 11 U/L (ref 0–44)
AST: 13 U/L — ABNORMAL LOW (ref 15–41)
Albumin: 3.7 g/dL (ref 3.5–5.0)
Alkaline Phosphatase: 60 U/L (ref 38–126)
Anion gap: 8 (ref 5–15)
BUN: 13 mg/dL (ref 8–23)
CO2: 29 mmol/L (ref 22–32)
Calcium: 8.9 mg/dL (ref 8.9–10.3)
Chloride: 96 mmol/L — ABNORMAL LOW (ref 98–111)
Creatinine, Ser: 0.85 mg/dL (ref 0.61–1.24)
GFR calc Af Amer: >60 mL/min (ref >60)
GFR calc non Af Amer: >60 mL/min (ref >60)
Glucose, Bld: 123 mg/dL — ABNORMAL HIGH (ref 70–99)
Potassium: 3.3 mmol/L — ABNORMAL LOW (ref 3.5–5.1)
Sodium: 133 mmol/L — ABNORMAL LOW (ref 135–145)
Total Bilirubin: 1.5 mg/dL — ABNORMAL HIGH (ref 0.3–1.2)
Total Protein: 7.2 g/dL (ref 6.5–8.1)

## 2019-02-14 NOTE — Progress Notes (Signed)
HPI [name] returns today for followup of chronic systolic heart failure, CHB, s/p BIV PPM insertion. In the interim, he has been treated for lung CA with XRT. He has mild edema and admits to sodium indiscretion. Allergies  Allergen Reactions  . Ace Inhibitors Nausea Only  . Tape Rash    And Bandaids, too     Current Outpatient Medications  Medication Sig Dispense Refill  . acetaminophen (TYLENOL) 500 MG tablet Take 500 mg by mouth every 6 (six) hours as needed for pain.    [name] aspirin EC 81 MG tablet Take 81 mg by mouth daily.    . cholecalciferol (VITAMIN D) 1000 units tablet Take 1,000 Units by mouth 2 (two) times daily.    . dorzolamide-timolol (COSOPT) 22.3-6.8 MG/ML ophthalmic solution Place 1 drop into the left eye 2 (two) times daily.    . hydrochlorothiazide (HYDRODIURIL) 25 MG tablet Take 25 mg by mouth daily.    . metoprolol succinate (TOPROL-XL) 50 MG 24 hr tablet Take 1 tablet (50 mg total) by mouth daily. Take with or immediately following a meal.    . nitroGLYCERIN (NITROSTAT) 0.4 MG SL tablet Place 0.4 mg under the tongue every 5 (five) minutes as needed for chest pain.    . pantoprazole (PROTONIX) 40 MG tablet Take 1 tablet (40 mg total) by mouth daily before breakfast.    . potassium chloride SA (KLOR-CON) 20 MEQ tablet Take 2 tablets (40 mEq total) by mouth 2 (two) times daily. For 2 days    . sucralfate (CARAFATE) 1 GM/10ML suspension Take 10 mLs (1 g total) by mouth 4 (four) times daily -  with meals and at bedtime. 420 mL 0  . tamsulosin (FLOMAX) 0.4 MG CAPS capsule Take 0.4 mg by mouth daily.    . vitamin B-12 (CYANOCOBALAMIN) 1000 MCG tablet Take 1,000 mcg by mouth daily.     No current facility-administered medications for this visit.     Past Medical History:  Diagnosis Date  . Atherosclerotic heart disease of native coronary artery without angina pectoris   . Bradycardia, unspecified   . CAD (coronary artery disease)    a. STEMI 04/2008 s/p BMS  to prox & distal RCA, staged DES to LAD same admission. b. Nuc 03/2017: scar but no ischemia, EF 45-54%. c. 09/2017: NSTEMI - DES x 2 to proximal and mid-RCA  . Cancer ([place])   . Chronic anticoagulation   . Chronic atrial fibrillation ([place])   . Chronic systolic (congestive) heart failure ([place])   . CKD (chronic kidney disease), stage II   . Cough, persistent   . GERD (gastroesophageal reflux disease)   . History of BPH   . HTN (hypertension)   . Hypercholesteremia   . LV dysfunction    a. EF 45-50% in 01/2016.  . Malignant neoplasm of unspecified part of unspecified bronchus or lung ([place])   . Mild pulmonary hypertension ([place])   . Obesity, unspecified   . Other esophagitis without bleeding   . PAF (paroxysmal atrial fibrillation) ([place])   . Pneumonia, unspecified organism   . Port-A-Cath in place 11/21/2018  . Presence of cardiac pacemaker   . Presence of permanent cardiac pacemaker   . Sinus node dysfunction ([place])    a. s/p StJude CRT-pacemaker 01/2016.  [name] Tinnitus, bilateral   . Unspecified glaucoma     ROS:   All systems reviewed and negative except as noted in the HPI.   Past Surgical History:  Procedure Laterality  Date  . BIOPSY  12/15/2018   Procedure: BIOPSY;  Surgeon: [name], MD;  [facility];  Service: Endoscopy;;  esophageal  . CARDIAC CATHETERIZATION    . COLONOSCOPY    . COLONOSCOPY N/A 08/27/2017   Dr. [name]: 25 5-25 mm polyps in descending colon, ascending, cecum. 2X3 cm carpet polyp in base of cecum lifted away s/p piecemeal polypectomy. Tubulovillous adenoma and tubular adenomas  . CORONARY STENT INTERVENTION N/A 10/22/2017   Procedure: CORONARY STENT INTERVENTION;  Surgeon: [name], MD;  Location: [place] [facility];  Service: Cardiovascular;  Laterality: N/A;  . CORONARY STENT PLACEMENT  04/2008   RCA and LAD  . EP IMPLANTABLE DEVICE N/A 01/09/2016   Procedure: BiV Pacemaker Insertion CRT-P;  Surgeon: [name], MD;  Location:  [place] CV LAB;  Service: Cardiovascular;  Laterality: N/A;  . ESOPHAGOGASTRODUODENOSCOPY N/A 08/27/2017   normal esophagus s/p dilation, gastric petechia, small hiatal hernia, normal duodenum  . ESOPHAGOGASTRODUODENOSCOPY (EGD) WITH PROPOFOL N/A 12/15/2018   Procedure: ESOPHAGOGASTRODUODENOSCOPY (EGD) WITH PROPOFOL;  Surgeon: [name], MD;  [facility];  Service: Endoscopy;  Laterality: N/A;  . INSERT / REPLACE / REMOVE PACEMAKER    . MALONEY DILATION N/A 08/27/2017   Procedure: [name] DILATION;  Surgeon: [name], MD;  [facility];  Service: Endoscopy;  Laterality: N/A;  . PORTACATH PLACEMENT Right 11/18/2018   Procedure: INSERTION PORT-A-CATH;  Surgeon: [name], MD;  Location[facility];  Service: General;  Laterality: Right;  . RIGHT/LEFT HEART CATH AND CORONARY ANGIOGRAPHY N/A 10/21/2017   Procedure: RIGHT/LEFT HEART CATH AND CORONARY ANGIOGRAPHY;  Surgeon: [name], MD;  Location: [place] [facility];  Service: Cardiovascular;  Laterality: N/A;     Family History  Problem Relation Age of Onset  . Emphysema Mother        mother died with emphysema and cancer  . Cancer Mother   . Prostate cancer Brother   . Heart disease Brother   . Hypothyroidism Daughter   . Hypothyroidism Daughter   . Colon cancer Neg Hx   . Colon polyps Neg Hx      Social History   Socioeconomic History  . Marital status: Married    Spouse name: Nicholas Sanchez  . Number of children: 3  . Years of education: Not on file  . Highest education level: Not on file  Occupational History  . Occupation: retired    Comment: Press photographer  Tobacco Use  . Smoking status: Former Smoker    Packs/day: 2.50    Years: 25.00    Pack years: 62.50    Types: Cigarettes    Quit date: 06/14/1973    Years since quitting: 45.7  . Smokeless tobacco: Former Systems developer    Types: Chew    Quit date: 06/14/1973  . Tobacco comment: some/chewed very little for about 2 years  Substance and Sexual  Activity  . Alcohol use: No  . Drug use: No  . Sexual activity: Not on file  Other Topics Concern  . 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    Social Determinants of Health   Financial Resource Strain: Low Risk   . Difficulty of Paying Living Expenses: Not very hard  Food Insecurity: No Food Insecurity  . Worried About Charity fundraiser in the Last Year: Never true  . Ran Out of Food in the Last Year: Never true  Transportation Needs:  No Transportation Needs  . Lack of Transportation (Medical): No  . Lack of Transportation (Non-Medical): No  Physical Activity: Inactive  . Days of Exercise per Week: 0 days  . Minutes of Exercise per Session: 0 min  Stress: No Stress Concern Present  . Feeling of Stress : Not at all  Social Connections: Slightly Isolated  . Frequency of Communication with Friends and Family: More than three times a week  . Frequency of Social Gatherings with Friends and Family: More than three times a week  . Attends Religious Services: More than 4 times per year  . Active Member of Clubs or Organizations: No  . Attends Archivist Meetings: Never  . Marital Status: Married  Human resources officer Violence: Not At Risk  . Fear of Current or Ex-Partner: No  . Emotionally Abused: No  . Physically Abused: No  . Sexually Abused: No     BP (!) 121/58   Pulse 66   Temp (!) 97.1 F (36.2 C) (Temporal)   Ht 6\' 1"  (1.854 m)   Wt 164 lb (74.4 kg)   SpO2 96%   BMI 21.64 kg/m   Physical Exam:  Well appearing NAD HEENT: Unremarkable Neck:  No JVD, no thyromegally Lymphatics:  No adenopathy Back:  No CVA tenderness Lungs:  Clear with no wheezes HEART:  Regular rate rhythm, no murmurs, no rubs, no clicks Abd:  soft, positive bowel sounds, no organomegally, no rebound, no guarding Ext:  2 plus pulses, no edema, no cyanosis, no clubbing Skin:  No rashes no nodules Neuro:  CN II through XII intact, motor  grossly intact  DEVICE  Normal device function.  See PaceArt for details.   Assess/Plan: 1. Chronic systolic heart failure - his symptoms are class 2 and well compensated. 2. BIV PPM - his St. Jude biv device is working normally. 3. Atrial fib - his symptoms are well controlled as he has CHB and no escape.  Nicholas Sanchez.D.

## 2019-02-14 NOTE — Progress Notes (Signed)
[place], [place] [address]   CLINIC:  Medical Oncology/Hematology  PCP:  Sinda Du, [place] [address] [phone]   REASON FOR VISIT:  Follow-up for lung cancer.  CURRENT THERAPY: Recommended combination chemoradiation therapy.   CANCER STAGING: Cancer Staging Squamous cell carcinoma of lung (HCC) Staging form: Lung, AJCC 8th Edition - Clinical stage from 11/09/2018: Stage IIIB (cT1c, cN3, cM0) - Unsigned    INTERVAL HISTORY:  Nicholas Sanchez 85 y.o. male seen for follow-up of lung cancer.  He was started on Imfinzi 1 week ago.  He denied any diarrhea, skin rash or dry cough.  He reported some constipation.  Appetite is 100%.  Energy levels are 75%.  He reports eating 3 meals per day and drinking 3 ensures per day.  He lost about 6 pounds in the last 1 week.  Denies any fevers or night sweats.    REVIEW OF SYSTEMS:  Review of Systems  Gastrointestinal: Positive for constipation.  All other systems reviewed and are negative.    PAST MEDICAL/SURGICAL HISTORY:  Past Medical History:  Diagnosis Date  . Atherosclerotic heart disease of native coronary artery without angina pectoris   . Bradycardia, unspecified   . CAD (coronary artery disease)    a. STEMI 04/2008 s/p BMS to prox & distal RCA, staged DES to LAD same admission. b. Nuc 03/2017: scar but no ischemia, EF 45-54%. c. 09/2017: NSTEMI - DES x 2 to proximal and mid-RCA  . Cancer ([place])   . Chronic anticoagulation   . Chronic atrial fibrillation ([place])   . Chronic systolic (congestive) heart failure ([place])   . CKD (chronic kidney disease), stage II   . Cough, persistent   . GERD (gastroesophageal reflux disease)   . History of BPH   . HTN (hypertension)   . Hypercholesteremia   . LV dysfunction    a. EF 45-50% in 01/2016.  . Malignant neoplasm of unspecified part of unspecified bronchus or lung ([place])   . Mild pulmonary hypertension ([place])   .  Obesity, unspecified   . Other esophagitis without bleeding   . PAF (paroxysmal atrial fibrillation) ([place])   . Pneumonia, unspecified organism   . Port-A-Cath in place 11/21/2018  . Presence of cardiac pacemaker   . Presence of permanent cardiac pacemaker   . Sinus node dysfunction ([place])    a. s/p StJude CRT-pacemaker 01/2016.  [name] Tinnitus, bilateral   . Unspecified glaucoma    Past Surgical History:  Procedure Laterality Date  . BIOPSY  12/15/2018   Procedure: BIOPSY;  Surgeon: [name], MD;  [facility];  Service: Endoscopy;;  esophageal  . CARDIAC CATHETERIZATION    . COLONOSCOPY    . COLONOSCOPY N/A 08/27/2017   Dr. [name]: 25 5-25 mm polyps in descending colon, ascending, cecum. 2X3 cm carpet polyp in base of cecum lifted away s/p piecemeal polypectomy. Tubulovillous adenoma and tubular adenomas  . CORONARY STENT INTERVENTION N/A 10/22/2017   Procedure: CORONARY STENT INTERVENTION;  Surgeon: [name], MD;  Location: [facility];  Service: Cardiovascular;  Laterality: N/A;  . CORONARY STENT PLACEMENT  04/2008   RCA and LAD  . EP IMPLANTABLE DEVICE N/A 01/09/2016   Procedure: BiV Pacemaker Insertion CRT-P;  Surgeon: [name], MD;  Location: [facility];  Service: Cardiovascular;  Laterality: N/A;  . ESOPHAGOGASTRODUODENOSCOPY N/A 08/27/2017   normal esophagus s/p dilation, gastric petechia, small hiatal hernia, normal duodenum  .  ESOPHAGOGASTRODUODENOSCOPY (EGD) WITH PROPOFOL N/A 12/15/2018   Procedure: ESOPHAGOGASTRODUODENOSCOPY (EGD) WITH PROPOFOL;  Surgeon: [name], MD;  [facility];  Service: Endoscopy;  Laterality: N/A;  . INSERT / REPLACE / REMOVE PACEMAKER    . MALONEY DILATION N/A 08/27/2017   Procedure: [name] DILATION;  Surgeon: [name], MD;  [facility];  Service: Endoscopy;  Laterality: N/A;  . PORTACATH PLACEMENT Right 11/18/2018   Procedure: INSERTION PORT-A-CATH;  Surgeon: [name],  MD;  Location: [facility];  Service: General;  Laterality: Right;  . RIGHT/LEFT HEART CATH AND CORONARY ANGIOGRAPHY N/A 10/21/2017   Procedure: RIGHT/LEFT HEART CATH AND CORONARY ANGIOGRAPHY;  Surgeon: [name], MD;  Location: [facility];  Service: Cardiovascular;  Laterality: N/A;     SOCIAL HISTORY:  Social History   Socioeconomic History  . Marital status: Married    Spouse name: Air traffic controller  . Number of children: 3  . Years of education: Not on file  . Highest education level: Not on file  Occupational History  . Occupation: retired    Comment: Press photographer  Tobacco Use  . Smoking status: Former Smoker    Packs/day: 2.50    Years: 25.00    Pack years: 62.50    Types: Cigarettes    Quit date: 06/14/1973    Years since quitting: 45.7  . Smokeless tobacco: Former Systems developer    Types: Chew    Quit date: 06/14/1973  . Tobacco comment: some/chewed very little for about 2 years  Substance and Sexual Activity  . Alcohol use: No  . Drug use: No  . Sexual activity: Not on file  Other Topics Concern  . 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    Social Determinants of Health   Financial Resource Strain: Low Risk   . Difficulty of Paying Living Expenses: Not very hard  Food Insecurity: No Food Insecurity  . Worried About Charity fundraiser in the Last Year: Never true  . Ran Out of Food in the Last Year: Never true  Transportation Needs: No Transportation Needs  . Lack of Transportation (Medical): No  . Lack of Transportation (Non-Medical): No  Physical Activity: Inactive  . Days of Exercise per Week: 0 days  . Minutes of Exercise per Session: 0 min  Stress: No Stress Concern Present  . Feeling of Stress : Not at all  Social Connections: Slightly Isolated  . Frequency of Communication with Friends and Family: More than three times a week  . Frequency of Social Gatherings with Friends and Family: More than three times a week  . Attends  Religious Services: More than 4 times per year  . Active Member of Clubs or Organizations: No  . Attends Archivist Meetings: Never  . Marital Status: Married  Human resources officer Violence: Not At Risk  . Fear of Current or Ex-Partner: No  . Emotionally Abused: No  . Physically Abused: No  . Sexually Abused: No    FAMILY HISTORY:  Family History  Problem Relation Age of Onset  . Emphysema Mother        mother died with emphysema and cancer  . Cancer Mother   . Prostate cancer Brother   . Heart disease Brother   . Hypothyroidism Daughter   . Hypothyroidism Daughter   . Colon cancer Neg Hx   . Colon polyps Neg Hx     CURRENT MEDICATIONS:  Outpatient Encounter Medications  as of 02/14/2019  Medication Sig  . acetaminophen (TYLENOL) 500 MG tablet Take 500 mg by mouth every 6 (six) hours as needed for pain.  [name] aspirin EC 81 MG tablet Take 81 mg by mouth daily.  . cholecalciferol (VITAMIN D) 1000 units tablet Take 1,000 Units by mouth 2 (two) times daily.  . dorzolamide-timolol (COSOPT) 22.3-6.8 MG/ML ophthalmic solution Place 1 drop into the left eye 2 (two) times daily.  . hydrochlorothiazide (HYDRODIURIL) 25 MG tablet Take 25 mg by mouth daily.  . metoprolol succinate (TOPROL-XL) 50 MG 24 hr tablet Take 1 tablet (50 mg total) by mouth daily. Take with or immediately following a meal.  . nitroGLYCERIN (NITROSTAT) 0.4 MG SL tablet Place 0.4 mg under the tongue every 5 (five) minutes as needed for chest pain.  . pantoprazole (PROTONIX) 40 MG tablet Take 1 tablet (40 mg total) by mouth daily before breakfast.  . potassium chloride SA (KLOR-CON) 20 MEQ tablet Take 2 tablets (40 mEq total) by mouth 2 (two) times daily. For 2 days  . sucralfate (CARAFATE) 1 GM/10ML suspension Take 10 mLs (1 g total) by mouth 4 (four) times daily -  with meals and at bedtime.  . tamsulosin (FLOMAX) 0.4 MG CAPS capsule Take 0.4 mg by mouth daily.  . vitamin B-12 (CYANOCOBALAMIN) 1000 MCG tablet Take  1,000 mcg by mouth daily.   No facility-administered encounter medications on file as of 02/14/2019.    ALLERGIES:  Allergies  Allergen Reactions  . Ace Inhibitors Nausea Only  . Tape Rash    And Bandaids, too     PHYSICAL EXAM:  ECOG Performance status: 1  Vitals:   02/14/19 1000  BP: (!) 121/58  Pulse: 66  Resp: 16  Temp: (!) 97.1 F (36.2 C)  SpO2: 96%   Filed Weights   02/14/19 1000  Weight: 164 lb 7 oz (74.6 kg)    Physical Exam Vitals reviewed.  Constitutional:      Appearance: Normal appearance.  Cardiovascular:     Rate and Rhythm: Normal rate and regular rhythm.     Heart sounds: Normal heart sounds.  Pulmonary:     Effort: Pulmonary effort is normal.     Breath sounds: Normal breath sounds.  Abdominal:     General: There is no distension.     Palpations: Abdomen is soft. There is no mass.  Musculoskeletal:        General: No swelling.  Skin:    General: Skin is warm.  Neurological:     General: No focal deficit present.     Mental Status: He is alert and oriented to person, place, and time.  Psychiatric:        Mood and Affect: Mood normal.        Behavior: Behavior normal.      LABORATORY DATA:  I have reviewed the labs as listed.  CBC    Component Value Date/Time   WBC 5.6 02/14/2019 1010   RBC 3.00 (L) 02/14/2019 1010   HGB 10.3 (L) 02/14/2019 1010   HCT 33.1 (L) 02/14/2019 1010   PLT 231 02/14/2019 1010   MCV 110.3 (H) 02/14/2019 1010   MCH 34.3 (H) 02/14/2019 1010   MCHC 31.1 02/14/2019 1010   RDW 14.5 02/14/2019 1010   LYMPHSABS 0.4 (L) 02/14/2019 1010   MONOABS 0.5 02/14/2019 1010   EOSABS 0.1 02/14/2019 1010   BASOSABS 0.0 02/14/2019 1010   CMP Latest Ref Rng & Units 02/14/2019 02/07/2019 01/30/2019  Glucose 70 -  99 mg/dL 123(H) 156(H) 130(H)  BUN 8 - 23 mg/dL 13 16 12   Creatinine 0.61 - 1.24 mg/dL 0.85 0.80 0.91  Sodium 135 - 145 mmol/L 133(L) 135 132(L)  Potassium 3.5 - 5.1 mmol/L 3.3(L) 3.4(L) 3.9  Chloride 98 -  111 mmol/L 96(L) 99 97(L)  CO2 22 - 32 mmol/L 29 26 27   Calcium 8.9 - 10.3 mg/dL 8.9 8.7(L) 8.7(L)  Total Protein 6.5 - 8.1 g/dL 7.2 6.7 6.9  Total Bilirubin 0.3 - 1.2 mg/dL 1.5(H) 0.9 0.9  Alkaline Phos 38 - 126 U/L 60 66 68  AST 15 - 41 U/L 13(L) 15 16  ALT 0 - 44 U/L 11 12 14        DIAGNOSTIC IMAGING:  I have independently reviewed the scans and discussed with the patient.    ASSESSMENT & PLAN:   Squamous cell carcinoma of lung ([place]) 1.  [name] squamous cell carcinoma left lung: -PET scan on 10/17/2018 showed hypermetabolic left lower lobe nodule, left hilar and contralateral right lower paratracheal lymph node along with right hilar lymph nodes.  Hypermetabolic subcarinal lymph nodes present.  Hypermetabolic lymph node in the porta hepatis with SUV 4.9, measuring 1.1 cm, favoring reactive adenopathy. -26 pound weight loss in the last 1 year, ex-smoker quit in 1975. -Left lower lobe lung biopsy on 10/31/2018 consistent with squamous cell carcinoma. - Weekly carbotaxol started on 11/21/2018 and radiation on 11/22/2018. -He received 3 weekly doses of carboplatin and paclitaxel, last dose on 12/05/2018.  Last XRT was on 12/09/2018. -He had prolonged hospitalization from 12/12/2018 through 12/26/2018 with severe radiation esophagitis and weakness.  He was subsequently discharged to a rehab facility.  He is currently been home since last 1 week.  He could not tolerate any further chemo or radiation. -CT chest with contrast on 01/25/2019 showed posterior left lower lobe cavitary nodule measuring 1.7 x 2.7 cm, previously 2.8 x 3.0 cm.  Improvement in size of the nodule meta stasis.  No new lesions were seen. -Durvalumab consolidation started on 02/07/2019. -he reports that he has been eating well 3 meals a day and drinking 3 Ensure per day.  However he lost 6 pounds in the last 1 week. -We will keep a close eye on his weight.  We will reevaluate him next week.  2.  Paroxysmal atrial  fibrillation: -Xarelto is on hold.  He is taking aspirin 81 mg daily.  No hemoptysis reported.  3.  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  He had a pacemaker in November 2018. - Plavix is on hold since hemoptysis.  He will continue aspirin 81 mg daily.  4.  Hypokalemia: -His potassium is 3.3 today.  He is on hydrochlorothiazide.  We will monitor it closely.     Orders placed this encounter:  No orders of the defined types were placed in this encounter.     Derek Jack, MD [facility] [phone]

## 2019-02-14 NOTE — Patient Instructions (Addendum)
[place] at [facility] Discharge Instructions  You were seen today by Dr. Delton Coombes. He went over your recent lab results. Continue eating three meals a day and drinking the ensure and boost. He will see you back in 1 week for labs, treatment and follow up.   Thank you for choosing [place] at [facility] to provide your oncology and hematology care.  To afford each patient quality time with our provider, please arrive at least 15 minutes before your scheduled appointment time.   If you have a lab appointment with the [facility] please come in thru the  Main Entrance and check in at the main information desk  You need to re-schedule your appointment should you arrive 10 or more minutes late.  We strive to give you quality time with our providers, and arriving late affects you and other patients whose appointments are after yours.  Also, if you no show three or more times for appointments you may be dismissed from the clinic at the providers discretion.     Again, thank you for choosing [facility].  Our hope is that these requests will decrease the amount of time that you wait before being seen by our physicians.       _____________________________________________________________  Should you have questions after your visit to [facility], please contact our office at [phone] between the hours of 8:00 a.m. and 4:30 p.m.  Voicemails left after 4:00 p.m. will not be returned until the following business day.  For prescription refill requests, have your pharmacy contact our office and allow 72 hours.    Cancer Center Support Programs:   > Cancer Support Group  2nd Tuesday of the month 1pm-2pm, Journey Room

## 2019-02-14 NOTE — Patient Instructions (Signed)
Medication Instructions:  Your physician recommends that you continue on your current medications as directed. Please refer to the Current Medication list given to you today.  *If you need a refill on your cardiac medications before your next appointment, please call your pharmacy*  Lab Work: NONE  If you have labs (blood work) drawn today and your tests are completely normal, you will receive your results only by: . MyChart Message (if you have MyChart) OR . A paper copy in the mail If you have any lab test that is abnormal or we need to change your treatment, we will call you to review the results.  Testing/Procedures: NONE   Follow-Up: At [facility], you and your health needs are our priority.  As part of our continuing mission to provide you with exceptional heart care, we have created designated Provider Care Teams.  These Care Teams include your primary Cardiologist (physician) and Advanced Practice Providers (APPs -  Physician Assistants and Nurse Practitioners) who all work together to provide you with the care you need, when you need it.  Your next appointment:   1 year(s)  The format for your next appointment:   In Person  Provider:   Gregg Taylor, MD  Other Instructions Thank you for choosing [facility]!    

## 2019-02-15 DIAGNOSIS — I2510 Atherosclerotic heart disease of native coronary artery without angina pectoris: Secondary | ICD-10-CM | POA: Diagnosis not present

## 2019-02-15 DIAGNOSIS — K5900 Constipation, unspecified: Secondary | ICD-10-CM | POA: Diagnosis not present

## 2019-02-15 DIAGNOSIS — E7800 Pure hypercholesterolemia, unspecified: Secondary | ICD-10-CM | POA: Diagnosis not present

## 2019-02-15 DIAGNOSIS — Z9981 Dependence on supplemental oxygen: Secondary | ICD-10-CM | POA: Diagnosis not present

## 2019-02-15 DIAGNOSIS — T66XXXD Radiation sickness, unspecified, subsequent encounter: Secondary | ICD-10-CM | POA: Diagnosis not present

## 2019-02-15 DIAGNOSIS — R339 Retention of urine, unspecified: Secondary | ICD-10-CM | POA: Diagnosis not present

## 2019-02-15 DIAGNOSIS — K2100 Gastro-esophageal reflux disease with esophagitis, without bleeding: Secondary | ICD-10-CM | POA: Diagnosis not present

## 2019-02-15 DIAGNOSIS — C3491 Malignant neoplasm of unspecified part of right bronchus or lung: Secondary | ICD-10-CM | POA: Diagnosis not present

## 2019-02-15 DIAGNOSIS — H409 Unspecified glaucoma: Secondary | ICD-10-CM | POA: Diagnosis not present

## 2019-02-15 DIAGNOSIS — E876 Hypokalemia: Secondary | ICD-10-CM | POA: Diagnosis not present

## 2019-02-15 DIAGNOSIS — Y842 Radiological procedure and radiotherapy as the cause of abnormal reaction of the patient, or of later complication, without mention of misadventure at the time of the procedure: Secondary | ICD-10-CM | POA: Diagnosis not present

## 2019-02-15 DIAGNOSIS — N179 Acute kidney failure, unspecified: Secondary | ICD-10-CM | POA: Diagnosis not present

## 2019-02-15 DIAGNOSIS — Z452 Encounter for adjustment and management of vascular access device: Secondary | ICD-10-CM | POA: Diagnosis not present

## 2019-02-15 DIAGNOSIS — I129 Hypertensive chronic kidney disease with stage 1 through stage 4 chronic kidney disease, or unspecified chronic kidney disease: Secondary | ICD-10-CM | POA: Diagnosis not present

## 2019-02-15 DIAGNOSIS — E559 Vitamin D deficiency, unspecified: Secondary | ICD-10-CM | POA: Diagnosis not present

## 2019-02-15 DIAGNOSIS — I480 Paroxysmal atrial fibrillation: Secondary | ICD-10-CM | POA: Diagnosis not present

## 2019-02-15 DIAGNOSIS — R1312 Dysphagia, oropharyngeal phase: Secondary | ICD-10-CM | POA: Diagnosis not present

## 2019-02-15 DIAGNOSIS — E538 Deficiency of other specified B group vitamins: Secondary | ICD-10-CM | POA: Diagnosis not present

## 2019-02-15 DIAGNOSIS — N182 Chronic kidney disease, stage 2 (mild): Secondary | ICD-10-CM | POA: Diagnosis not present

## 2019-02-16 ENCOUNTER — Encounter (HOSPITAL_COMMUNITY): Payer: Self-pay | Admitting: Hematology

## 2019-02-16 DIAGNOSIS — R339 Retention of urine, unspecified: Secondary | ICD-10-CM | POA: Diagnosis not present

## 2019-02-16 DIAGNOSIS — E559 Vitamin D deficiency, unspecified: Secondary | ICD-10-CM | POA: Diagnosis not present

## 2019-02-16 DIAGNOSIS — I129 Hypertensive chronic kidney disease with stage 1 through stage 4 chronic kidney disease, or unspecified chronic kidney disease: Secondary | ICD-10-CM | POA: Diagnosis not present

## 2019-02-16 DIAGNOSIS — I480 Paroxysmal atrial fibrillation: Secondary | ICD-10-CM | POA: Diagnosis not present

## 2019-02-16 DIAGNOSIS — E538 Deficiency of other specified B group vitamins: Secondary | ICD-10-CM | POA: Diagnosis not present

## 2019-02-16 DIAGNOSIS — Z452 Encounter for adjustment and management of vascular access device: Secondary | ICD-10-CM | POA: Diagnosis not present

## 2019-02-16 DIAGNOSIS — H409 Unspecified glaucoma: Secondary | ICD-10-CM | POA: Diagnosis not present

## 2019-02-16 DIAGNOSIS — K2100 Gastro-esophageal reflux disease with esophagitis, without bleeding: Secondary | ICD-10-CM | POA: Diagnosis not present

## 2019-02-16 DIAGNOSIS — N182 Chronic kidney disease, stage 2 (mild): Secondary | ICD-10-CM | POA: Diagnosis not present

## 2019-02-16 DIAGNOSIS — T66XXXD Radiation sickness, unspecified, subsequent encounter: Secondary | ICD-10-CM | POA: Diagnosis not present

## 2019-02-16 DIAGNOSIS — I2510 Atherosclerotic heart disease of native coronary artery without angina pectoris: Secondary | ICD-10-CM | POA: Diagnosis not present

## 2019-02-16 DIAGNOSIS — R1312 Dysphagia, oropharyngeal phase: Secondary | ICD-10-CM | POA: Diagnosis not present

## 2019-02-16 DIAGNOSIS — Y842 Radiological procedure and radiotherapy as the cause of abnormal reaction of the patient, or of later complication, without mention of misadventure at the time of the procedure: Secondary | ICD-10-CM | POA: Diagnosis not present

## 2019-02-16 DIAGNOSIS — K5900 Constipation, unspecified: Secondary | ICD-10-CM | POA: Diagnosis not present

## 2019-02-16 DIAGNOSIS — N179 Acute kidney failure, unspecified: Secondary | ICD-10-CM | POA: Diagnosis not present

## 2019-02-16 DIAGNOSIS — E876 Hypokalemia: Secondary | ICD-10-CM | POA: Diagnosis not present

## 2019-02-16 DIAGNOSIS — Z9981 Dependence on supplemental oxygen: Secondary | ICD-10-CM | POA: Diagnosis not present

## 2019-02-16 DIAGNOSIS — E7800 Pure hypercholesterolemia, unspecified: Secondary | ICD-10-CM | POA: Diagnosis not present

## 2019-02-16 DIAGNOSIS — C3491 Malignant neoplasm of unspecified part of right bronchus or lung: Secondary | ICD-10-CM | POA: Diagnosis not present

## 2019-02-16 NOTE — Assessment & Plan Note (Signed)
1.  [name] squamous cell carcinoma left lung: -PET scan on 10/17/2018 showed hypermetabolic left lower lobe nodule, left hilar and contralateral right lower paratracheal lymph node along with right hilar lymph nodes.  Hypermetabolic subcarinal lymph nodes present.  Hypermetabolic lymph node in the porta hepatis with SUV 4.9, measuring 1.1 cm, favoring reactive adenopathy. -26 pound weight loss in the last 1 year, ex-smoker quit in 1975. -Left lower lobe lung biopsy on 10/31/2018 consistent with squamous cell carcinoma. - Weekly carbotaxol started on 11/21/2018 and radiation on 11/22/2018. -He received 3 weekly doses of carboplatin and paclitaxel, last dose on 12/05/2018.  Last XRT was on 12/09/2018. -He had prolonged hospitalization from 12/12/2018 through 12/26/2018 with severe radiation esophagitis and weakness.  He was subsequently discharged to a rehab facility.  He is currently been home since last 1 week.  He could not tolerate any further chemo or radiation. -CT chest with contrast on 01/25/2019 showed posterior left lower lobe cavitary nodule measuring 1.7 x 2.7 cm, previously 2.8 x 3.0 cm.  Improvement in size of the nodule meta stasis.  No new lesions were seen. -Durvalumab consolidation started on 02/07/2019. -he reports that he has been eating well 3 meals a day and drinking 3 Ensure per day.  However he lost 6 pounds in the last 1 week. -We will keep a close eye on his weight.  We will reevaluate him next week.  2.  Paroxysmal atrial fibrillation: -Xarelto is on hold.  He is taking aspirin 81 mg daily.  No hemoptysis reported.  3.  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  He had a pacemaker in November 2018. - Plavix is on hold since hemoptysis.  He will continue aspirin 81 mg daily.  4.  Hypokalemia: -His potassium is 3.3 today.  He is on hydrochlorothiazide.  We will monitor it closely.

## 2019-02-17 ENCOUNTER — Ambulatory Visit
Admission: EM | Admit: 2019-02-17 | Discharge: 2019-02-17 | Disposition: A | Discharge status: Home / Self Care | Payer: Medicare Other

## 2019-02-17 ENCOUNTER — Other Ambulatory Visit: Payer: Self-pay

## 2019-02-17 ENCOUNTER — Inpatient Hospital Stay (HOSPITAL_COMMUNITY): Payer: Medicare Other

## 2019-02-17 NOTE — Progress Notes (Signed)
Nutrition Follow-up:  Patient with squamous cell carcinoma of left lung.  Patient started durvalumab on 02/07/2019.    Spoke with patient via phone for nutrition follow-up.  Patient reports that he has a good appetite.  Breakfast is usually grilled cheese sandwich or eggs and grits or pancakes with applesauce.  Lunch is usually soup (chicken noodle soup but sometimes only eats broth), salmon stew.  If having soup will sometimes eat 1/2 sandwich.  Supper is sometimes soup as well or meat and vegetables.  Likes cottage cheese and [employer].   Drinking 2-3 ensure plus/boost plus shakes per day   Medications: reviewed  Labs: reviewed  Anthropometrics:   Weight decreased 164 lb 7 oz on 12/15 from 170 lb 12.8 oz on 12/8.    UBW of 192 lb 11/28/2018   NUTRITION DIAGNOSIS: Inadequate oral intake continues   INTERVENTION:  Soups especially just drinking broth is very low in calories.  Discussed ways to increase calories and protein in soups.  Encouraged continuing to drink high calorie, high protein shakes 2-3 daily.  Will provide complimentary case of ensure enlive for patient to pick up on 12/22.   Patient has contact information.     MONITORING, EVALUATION, GOAL: Patient will tolerate increased calories and protein to prevent further weight loss   NEXT VISIT: phone f/u Jan 22  Jil Penland B. Zenia Resides, [place], [place] Registered Dietitian [phone] (pager)

## 2019-02-17 NOTE — ED Triage Notes (Signed)
Pt presents with pressure sore that developed while he was in rehab

## 2019-02-17 NOTE — ED Notes (Signed)
Pressure sore is healing and skin is WNL , provider made aware, ok to discharge with no further instructions

## 2019-02-20 ENCOUNTER — Other Ambulatory Visit: Payer: Self-pay

## 2019-02-21 ENCOUNTER — Inpatient Hospital Stay (HOSPITAL_BASED_OUTPATIENT_CLINIC_OR_DEPARTMENT_OTHER): Payer: Medicare Other | Admitting: Hematology

## 2019-02-21 ENCOUNTER — Encounter (HOSPITAL_COMMUNITY): Payer: Self-pay | Admitting: Hematology

## 2019-02-21 ENCOUNTER — Inpatient Hospital Stay (HOSPITAL_COMMUNITY): Payer: Medicare Other

## 2019-02-21 ENCOUNTER — Encounter (HOSPITAL_COMMUNITY): Payer: Self-pay

## 2019-02-21 VITALS — BP 130/58 | HR 70 | Temp 97.3°F | Resp 18

## 2019-02-21 DIAGNOSIS — Z5111 Encounter for antineoplastic chemotherapy: Secondary | ICD-10-CM | POA: Diagnosis not present

## 2019-02-21 DIAGNOSIS — I480 Paroxysmal atrial fibrillation: Secondary | ICD-10-CM | POA: Diagnosis not present

## 2019-02-21 DIAGNOSIS — Z95828 Presence of other vascular implants and grafts: Secondary | ICD-10-CM

## 2019-02-21 DIAGNOSIS — C3490 Malignant neoplasm of unspecified part of unspecified bronchus or lung: Secondary | ICD-10-CM

## 2019-02-21 DIAGNOSIS — Z79899 Other long term (current) drug therapy: Secondary | ICD-10-CM | POA: Diagnosis not present

## 2019-02-21 DIAGNOSIS — Z923 Personal history of irradiation: Secondary | ICD-10-CM | POA: Diagnosis not present

## 2019-02-21 DIAGNOSIS — Z7902 Long term (current) use of antithrombotics/antiplatelets: Secondary | ICD-10-CM | POA: Diagnosis not present

## 2019-02-21 DIAGNOSIS — C3432 Malignant neoplasm of lower lobe, left bronchus or lung: Secondary | ICD-10-CM | POA: Diagnosis not present

## 2019-02-21 LAB — CBC WITH DIFFERENTIAL/PLATELET
Abs Immature Granulocytes: 0.02 10*3/uL (ref 0.00–0.07)
Basophils Absolute: 0 10*3/uL (ref 0.0–0.1)
Basophils Relative: 1 %
Eosinophils Absolute: 0.1 10*3/uL (ref 0.0–0.5)
Eosinophils Relative: 2 %
HCT: 33.4 % — ABNORMAL LOW (ref 39.0–52.0)
Hemoglobin: 10.5 g/dL — ABNORMAL LOW (ref 13.0–17.0)
Immature Granulocytes: 0 %
Lymphocytes Relative: 6 %
Lymphs Abs: 0.4 10*3/uL — ABNORMAL LOW (ref 0.7–4.0)
MCH: 34.4 pg — ABNORMAL HIGH (ref 26.0–34.0)
MCHC: 31.4 g/dL (ref 30.0–36.0)
MCV: 109.5 fL — ABNORMAL HIGH (ref 80.0–100.0)
Monocytes Absolute: 0.5 10*3/uL (ref 0.1–1.0)
Monocytes Relative: 8 %
Neutro Abs: 5.3 10*3/uL (ref 1.7–7.7)
Neutrophils Relative %: 83 %
Platelets: 221 10*3/uL (ref 150–400)
RBC: 3.05 MIL/uL — ABNORMAL LOW (ref 4.22–5.81)
RDW: 13.8 % (ref 11.5–15.5)
WBC: 6.3 10*3/uL (ref 4.0–10.5)
nRBC: 0 % (ref 0.0–0.2)

## 2019-02-21 LAB — COMPREHENSIVE METABOLIC PANEL
ALT: 11 U/L (ref 0–44)
AST: 14 U/L — ABNORMAL LOW (ref 15–41)
Albumin: 3.7 g/dL (ref 3.5–5.0)
Alkaline Phosphatase: 57 U/L (ref 38–126)
Anion gap: 11 (ref 5–15)
BUN: 17 mg/dL (ref 8–23)
CO2: 28 mmol/L (ref 22–32)
Calcium: 9.4 mg/dL (ref 8.9–10.3)
Chloride: 97 mmol/L — ABNORMAL LOW (ref 98–111)
Creatinine, Ser: 0.85 mg/dL (ref 0.61–1.24)
GFR calc Af Amer: >60 mL/min (ref >60)
GFR calc non Af Amer: >60 mL/min (ref >60)
Glucose, Bld: 167 mg/dL — ABNORMAL HIGH (ref 70–99)
Potassium: 3.4 mmol/L — ABNORMAL LOW (ref 3.5–5.1)
Sodium: 136 mmol/L (ref 135–145)
Total Bilirubin: 0.8 mg/dL (ref 0.3–1.2)
Total Protein: 7.2 g/dL (ref 6.5–8.1)

## 2019-02-21 MED ORDER — SODIUM CHLORIDE 0.9 % IV SOLN: Freq: Once | INTRAVENOUS | Status: AC

## 2019-02-21 MED ORDER — HEPARIN SOD (PORK) LOCK FLUSH 100 UNIT/ML IV SOLN
500.0000 [IU] | Freq: Once | INTRAVENOUS | Status: AC | PRN
  Administered 2019-02-21: 500 [IU]

## 2019-02-21 MED ORDER — SODIUM CHLORIDE 0.9% FLUSH
10.0000 mL | INTRAVENOUS | Status: DC | PRN
  Administered 2019-02-21: 10:00:00 10 mL

## 2019-02-21 MED ORDER — SODIUM CHLORIDE 0.9 % IV SOLN
9.6000 mg/kg | Freq: Once | INTRAVENOUS | Status: AC
  Administered 2019-02-21: 740 mg via INTRAVENOUS
  Filled 2019-02-21: qty 4.8

## 2019-02-21 NOTE — Progress Notes (Signed)
[place], [place] [address]   CLINIC:  Medical Oncology/Hematology  PCP:  Sinda Du, [place] [address] [phone]   REASON FOR VISIT:  Follow-up for lung cancer.  CURRENT THERAPY: Consolidation immunotherapy.   CANCER STAGING: Cancer Staging Squamous cell carcinoma of lung (HCC) Staging form: Lung, AJCC 8th Edition - Clinical stage from 11/09/2018: Stage IIIB (cT1c, cN3, cM0) - Unsigned    INTERVAL HISTORY:  Mr. Eskew 85 y.o. male seen for follow-up of lung cancer.  He started consolidation immunotherapy with durvalumab on 02/07/2019.  He lost 6 pounds in the first week but gained back about 3 pounds in the last 1 week.  Denied any diarrhea.  Denied any skin rashes.  Denied any worsening of his baseline cough.  Appetite is 75%.  Energy levels are 50%.  Drinking about 3 cans of boost per day.  He also met with our nutritionist.  No fevers, night sweats or weight loss.    REVIEW OF SYSTEMS:  Review of Systems  All other systems reviewed and are negative.    PAST MEDICAL/SURGICAL HISTORY:  Past Medical History:  Diagnosis Date  . Atherosclerotic heart disease of native coronary artery without angina pectoris   . Bradycardia, unspecified   . CAD (coronary artery disease)    a. STEMI 04/2008 s/p BMS to prox & distal RCA, staged DES to LAD same admission. b. Nuc 03/2017: scar but no ischemia, EF 45-54%. c. 09/2017: NSTEMI - DES x 2 to proximal and mid-RCA  . Cancer ([place])   . Chronic anticoagulation   . Chronic atrial fibrillation ([place])   . Chronic systolic (congestive) heart failure ([place])   . CKD (chronic kidney disease), stage II   . Cough, persistent   . GERD (gastroesophageal reflux disease)   . History of BPH   . HTN (hypertension)   . Hypercholesteremia   . LV dysfunction    a. EF 45-50% in 01/2016.  . Malignant neoplasm of unspecified part of unspecified bronchus or lung ([place])   . Mild  pulmonary hypertension ([place])   . Obesity, unspecified   . Other esophagitis without bleeding   . PAF (paroxysmal atrial fibrillation) ([place])   . Pneumonia, unspecified organism   . Port-A-Cath in place 11/21/2018  . Presence of cardiac pacemaker   . Presence of permanent cardiac pacemaker   . Sinus node dysfunction ([place])    a. s/p StJude CRT-pacemaker 01/2016.  [name] Tinnitus, bilateral   . Unspecified glaucoma    Past Surgical History:  Procedure Laterality Date  . BIOPSY  12/15/2018   Procedure: BIOPSY;  Surgeon: [name], MD;  [facility];  Service: Endoscopy;;  esophageal  . CARDIAC CATHETERIZATION    . COLONOSCOPY    . COLONOSCOPY N/A 08/27/2017   Dr. [name]: 25 5-25 mm polyps in descending colon, ascending, cecum. 2X3 cm carpet polyp in base of cecum lifted away s/p piecemeal polypectomy. Tubulovillous adenoma and tubular adenomas  . CORONARY STENT INTERVENTION N/A 10/22/2017   Procedure: CORONARY STENT INTERVENTION;  Surgeon: [name], MD;  Location: [facility];  Service: Cardiovascular;  Laterality: N/A;  . CORONARY STENT PLACEMENT  04/2008   RCA and LAD  . EP IMPLANTABLE DEVICE N/A 01/09/2016   Procedure: BiV Pacemaker Insertion CRT-P;  Surgeon: [name], MD;  Location: [facility];  Service: Cardiovascular;  Laterality: N/A;  . ESOPHAGOGASTRODUODENOSCOPY N/A 08/27/2017   normal esophagus s/p dilation, gastric  petechia, small hiatal hernia, normal duodenum  . ESOPHAGOGASTRODUODENOSCOPY (EGD) WITH PROPOFOL N/A 12/15/2018   Procedure: ESOPHAGOGASTRODUODENOSCOPY (EGD) WITH PROPOFOL;  Surgeon: [name], MD;  [facility];  Service: Endoscopy;  Laterality: N/A;  . INSERT / REPLACE / REMOVE PACEMAKER    . MALONEY DILATION N/A 08/27/2017   Procedure: [name] DILATION;  Surgeon: [name], MD;  [facility];  Service: Endoscopy;  Laterality: N/A;  . PORTACATH PLACEMENT Right 11/18/2018   Procedure: INSERTION  PORT-A-CATH;  Surgeon: [name], MD;  Location: [facility];  Service: General;  Laterality: Right;  . RIGHT/LEFT HEART CATH AND CORONARY ANGIOGRAPHY N/A 10/21/2017   Procedure: RIGHT/LEFT HEART CATH AND CORONARY ANGIOGRAPHY;  Surgeon: [name], MD;  Location: [facility];  Service: Cardiovascular;  Laterality: N/A;     SOCIAL HISTORY:  Social History   Socioeconomic History  . Marital status: Married    Spouse name: Air traffic controller  . Number of children: 3  . Years of education: Not on file  . Highest education level: Not on file  Occupational History  . Occupation: retired    Comment: Press photographer  Tobacco Use  . Smoking status: Former Smoker    Packs/day: 2.50    Years: 25.00    Pack years: 62.50    Types: Cigarettes    Quit date: 06/14/1973    Years since quitting: 45.7  . Smokeless tobacco: Former Systems developer    Types: Chew    Quit date: 06/14/1973  . Tobacco comment: some/chewed very little for about 2 years  Substance and Sexual Activity  . Alcohol use: No  . Drug use: No  . Sexual activity: Not on file  Other Topics Concern  . 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    Social Determinants of Health   Financial Resource Strain: Low Risk   . Difficulty of Paying Living Expenses: Not very hard  Food Insecurity: No Food Insecurity  . Worried About Charity fundraiser in the Last Year: Never true  . Ran Out of Food in the Last Year: Never true  Transportation Needs: No Transportation Needs  . Lack of Transportation (Medical): No  . Lack of Transportation (Non-Medical): No  Physical Activity: Inactive  . Days of Exercise per Week: 0 days  . Minutes of Exercise per Session: 0 min  Stress: No Stress Concern Present  . Feeling of Stress : Not at all  Social Connections: Slightly Isolated  . Frequency of Communication with Friends and Family: More than three times a week  . Frequency of Social Gatherings with Friends and Family:  More than three times a week  . Attends Religious Services: More than 4 times per year  . Active Member of Clubs or Organizations: No  . Attends Archivist Meetings: Never  . Marital Status: Married  Human resources officer Violence: Not At Risk  . Fear of Current or Ex-Partner: No  . Emotionally Abused: No  . Physically Abused: No  . Sexually Abused: No    FAMILY HISTORY:  Family History  Problem Relation Age of Onset  . Emphysema Mother        mother died with emphysema and cancer  . Cancer Mother   . Prostate cancer Brother   . Heart disease Brother   . Hypothyroidism Daughter   . Hypothyroidism Daughter   . Colon cancer Neg Hx   . Colon polyps Neg Hx  CURRENT MEDICATIONS:  Outpatient Encounter Medications as of 02/21/2019  Medication Sig  . acetaminophen (TYLENOL) 500 MG tablet Take 500 mg by mouth every 6 (six) hours as needed for pain.  [name] aspirin EC 81 MG tablet Take 81 mg by mouth daily.  . cholecalciferol (VITAMIN D) 1000 units tablet Take 1,000 Units by mouth 2 (two) times daily.  . dorzolamide-timolol (COSOPT) 22.3-6.8 MG/ML ophthalmic solution Place 1 drop into the left eye 2 (two) times daily.  . hydrochlorothiazide (HYDRODIURIL) 25 MG tablet Take 25 mg by mouth daily.  . metoprolol succinate (TOPROL-XL) 50 MG 24 hr tablet Take 1 tablet (50 mg total) by mouth daily. Take with or immediately following a meal.  . nitroGLYCERIN (NITROSTAT) 0.4 MG SL tablet Place 0.4 mg under the tongue every 5 (five) minutes as needed for chest pain.  . pantoprazole (PROTONIX) 40 MG tablet Take 1 tablet (40 mg total) by mouth daily before breakfast.  . potassium chloride SA (KLOR-CON) 20 MEQ tablet Take 2 tablets (40 mEq total) by mouth 2 (two) times daily. For 2 days  . sucralfate (CARAFATE) 1 GM/10ML suspension Take 10 mLs (1 g total) by mouth 4 (four) times daily -  with meals and at bedtime.  . tamsulosin (FLOMAX) 0.4 MG CAPS capsule Take 0.4 mg by mouth daily.  . vitamin  B-12 (CYANOCOBALAMIN) 1000 MCG tablet Take 1,000 mcg by mouth daily.   No facility-administered encounter medications on file as of 02/21/2019.    ALLERGIES:  Allergies  Allergen Reactions  . Ace Inhibitors Nausea Only  . Tape Rash    And Bandaids, too     PHYSICAL EXAM:  ECOG Performance status: 1  Vitals:   02/21/19 0921  BP: 137/61  Pulse: 70  Resp: 16  Temp: (!) 97.3 F (36.3 C)  SpO2: 98%   Filed Weights   02/21/19 0921  Weight: 167 lb 9.6 oz (76 kg)    Physical Exam Vitals reviewed.  Constitutional:      Appearance: Normal appearance.  Cardiovascular:     Rate and Rhythm: Normal rate and regular rhythm.     Heart sounds: Normal heart sounds.  Pulmonary:     Effort: Pulmonary effort is normal.     Breath sounds: Normal breath sounds.  Abdominal:     General: There is no distension.     Palpations: Abdomen is soft. There is no mass.  Musculoskeletal:        General: No swelling.  Skin:    General: Skin is warm.  Neurological:     General: No focal deficit present.     Mental Status: He is alert and oriented to person, place, and time.  Psychiatric:        Mood and Affect: Mood normal.        Behavior: Behavior normal.      LABORATORY DATA:  I have reviewed the labs as listed.  CBC    Component Value Date/Time   WBC 6.3 02/21/2019 0919   RBC 3.05 (L) 02/21/2019 0919   HGB 10.5 (L) 02/21/2019 0919   HCT 33.4 (L) 02/21/2019 0919   PLT 221 02/21/2019 0919   MCV 109.5 (H) 02/21/2019 0919   MCH 34.4 (H) 02/21/2019 0919   MCHC 31.4 02/21/2019 0919   RDW 13.8 02/21/2019 0919   LYMPHSABS 0.4 (L) 02/21/2019 0919   MONOABS 0.5 02/21/2019 0919   EOSABS 0.1 02/21/2019 0919   BASOSABS 0.0 02/21/2019 0919   CMP Latest Ref Rng & Units 02/21/2019 02/14/2019  02/07/2019  Glucose 70 - 99 mg/dL 167(H) 123(H) 156(H)  BUN 8 - 23 mg/dL _0 Creatinine 0.61 - 1.24 mg/dL 0.85 0.85 0.80  Sodium 135 - 145 mmol/L 136 133(L) 135  Potassium 3.5 - 5.1 mmol/L  3.4(L) 3.3(L) 3.4(L)  Chloride 98 - 111 mmol/L 97(L) 96(L) 99  CO2 22 - 32 mmol/L _1 Calcium 8.9 - 10.3 mg/dL 9.4 8.9 8.7(L)  Total Protein 6.5 - 8.1 g/dL 7.2 7.2 6.7  Total Bilirubin 0.3 - 1.2 mg/dL 0.8 1.5(H) 0.9  Alkaline Phos 38 - 126 U/L 57 60 66  AST 15 - 41 U/L 14(L) 13(L) 15  ALT 0 - 44 U/L _2 DIAGNOSTIC IMAGING:  I have independently reviewed the scans and discussed with the patient.    ASSESSMENT & PLAN:   Squamous cell carcinoma of lung ([place]) 1.  [name] squamous cell carcinoma left lung: -PET scan on 10/17/2018 showed hypermetabolic left lower lobe nodule, left hilar and contralateral right lower paratracheal lymph node along with right hilar lymph nodes.  Hypermetabolic subcarinal lymph nodes present.  Hypermetabolic lymph node in the porta hepatis with SUV 4.9, measuring 1.1 cm, favoring reactive adenopathy. -26 pound weight loss in the last 1 year, ex-smoker quit in 1975. -Left lower lobe lung biopsy on 10/31/2018 consistent with squamous cell carcinoma. - Weekly carbotaxol started on 11/21/2018 and radiation on 11/22/2018. -He received 3 weekly doses of carboplatin and paclitaxel, last dose on 12/05/2018.  Last XRT was on 12/09/2018. -He had prolonged hospitalization from 12/12/2018 through 12/26/2018 with severe radiation esophagitis and weakness.  He was subsequently discharged to a rehab facility.  He is currently been home since last 1 week.  He could not tolerate any further chemo or radiation. -CT chest with contrast on 01/25/2019 showed posterior left lower lobe cavitary nodule measuring 1.7 x 2.7 cm, previously 2.8 x 3.0 cm.  Improvement in size of the nodule meta stasis.  No new lesions were seen. -Durvalumab consolidation every 2 weeks started on 02/07/2019. -He did not experience any immunotherapy related side effects.  I have reviewed his labs. -He will proceed with his cycle 2 of durvalumab today.  If he is tolerating it well after 3 or 4 doses,  we will might consider switching him to once a month regimen.  2.  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  Pacemaker was placed in November 2018. -Plavix on hold since hemoptysis.  He will continue aspirin daily.  3.  Hypokalemia: -Potassium is 3.4 today.  He is on hydrochlorothiazide.  We will closely monitor it.  4.  Weight loss: -He lost about 6 pounds within 1 week of first treatment of durvalumab.  He gained back 3 pounds. -He is drinking 3 cans of boost per day.  I have encouraged him to be active.     Orders placed this encounter:  No orders of the defined types were placed in this encounter.     Derek Jack, MD [place] [phone]

## 2019-02-21 NOTE — Assessment & Plan Note (Signed)
1.  [name] squamous cell carcinoma left lung: -PET scan on 10/17/2018 showed hypermetabolic left lower lobe nodule, left hilar and contralateral right lower paratracheal lymph node along with right hilar lymph nodes.  Hypermetabolic subcarinal lymph nodes present.  Hypermetabolic lymph node in the porta hepatis with SUV 4.9, measuring 1.1 cm, favoring reactive adenopathy. -26 pound weight loss in the last 1 year, ex-smoker quit in 1975. -Left lower lobe lung biopsy on 10/31/2018 consistent with squamous cell carcinoma. - Weekly carbotaxol started on 11/21/2018 and radiation on 11/22/2018. -He received 3 weekly doses of carboplatin and paclitaxel, last dose on 12/05/2018.  Last XRT was on 12/09/2018. -He had prolonged hospitalization from 12/12/2018 through 12/26/2018 with severe radiation esophagitis and weakness.  He was subsequently discharged to a rehab facility.  He is currently been home since last 1 week.  He could not tolerate any further chemo or radiation. -CT chest with contrast on 01/25/2019 showed posterior left lower lobe cavitary nodule measuring 1.7 x 2.7 cm, previously 2.8 x 3.0 cm.  Improvement in size of the nodule meta stasis.  No new lesions were seen. -Durvalumab consolidation every 2 weeks started on 02/07/2019. -He did not experience any immunotherapy related side effects.  I have reviewed his labs. -He will proceed with his cycle 2 of durvalumab today.  If he is tolerating it well after 3 or 4 doses, we will might consider switching him to once a month regimen.  2.  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  Pacemaker was placed in November 2018. -Plavix on hold since hemoptysis.  He will continue aspirin daily.  3.  Hypokalemia: -Potassium is 3.4 today.  He is on hydrochlorothiazide.  We will closely monitor it.  4.  Weight loss: -He lost about 6 pounds within 1 week of first treatment of durvalumab.  He gained back 3 pounds. -He is drinking 3 cans of boost per day.  I have  encouraged him to be active.

## 2019-02-21 NOTE — Patient Instructions (Signed)
[facility] at [facility] Discharge Instructions  You were seen today by Dr. Katragadda. He went over your recent lab results. He will see you back in 2 weeks for labs and follow up.   Thank you for choosing [facility] at [facility] to provide your oncology and hematology care.  To afford each patient quality time with our provider, please arrive at least 15 minutes before your scheduled appointment time.   If you have a lab appointment with the Cancer Center please come in thru the  Main Entrance and check in at the main information desk  You need to re-schedule your appointment should you arrive 10 or more minutes late.  We strive to give you quality time with our providers, and arriving late affects you and other patients whose appointments are after yours.  Also, if you no show three or more times for appointments you may be dismissed from the clinic at the providers discretion.     Again, thank you for choosing [facility].  Our hope is that these requests will decrease the amount of time that you wait before being seen by our physicians.       _____________________________________________________________  Should you have questions after your visit to [facility], please contact our office at [phone] between the hours of 8:00 a.m. and 4:30 p.m.  Voicemails left after 4:00 p.m. will not be returned until the following business day.  For prescription refill requests, have your pharmacy contact our office and allow 72 hours.    Cancer Center Support Programs:   > Cancer Support Group  2nd Tuesday of the month 1pm-2pm, Journey Room    

## 2019-02-21 NOTE — Progress Notes (Signed)
Labs reviewed with MD today. Proceed as planned.  Treatment given per orders. Patient tolerated it well without problems. Vitals stable and discharged home from clinic via wheelchair. Follow up as scheduled.

## 2019-03-06 DIAGNOSIS — E7800 Pure hypercholesterolemia, unspecified: Secondary | ICD-10-CM | POA: Diagnosis not present

## 2019-03-06 DIAGNOSIS — N179 Acute kidney failure, unspecified: Secondary | ICD-10-CM | POA: Diagnosis not present

## 2019-03-06 DIAGNOSIS — K2100 Gastro-esophageal reflux disease with esophagitis, without bleeding: Secondary | ICD-10-CM | POA: Diagnosis not present

## 2019-03-06 DIAGNOSIS — H409 Unspecified glaucoma: Secondary | ICD-10-CM | POA: Diagnosis not present

## 2019-03-06 DIAGNOSIS — Y842 Radiological procedure and radiotherapy as the cause of abnormal reaction of the patient, or of later complication, without mention of misadventure at the time of the procedure: Secondary | ICD-10-CM | POA: Diagnosis not present

## 2019-03-06 DIAGNOSIS — R339 Retention of urine, unspecified: Secondary | ICD-10-CM | POA: Diagnosis not present

## 2019-03-06 DIAGNOSIS — Z452 Encounter for adjustment and management of vascular access device: Secondary | ICD-10-CM | POA: Diagnosis not present

## 2019-03-06 DIAGNOSIS — K5900 Constipation, unspecified: Secondary | ICD-10-CM | POA: Diagnosis not present

## 2019-03-06 DIAGNOSIS — E559 Vitamin D deficiency, unspecified: Secondary | ICD-10-CM | POA: Diagnosis not present

## 2019-03-06 DIAGNOSIS — I129 Hypertensive chronic kidney disease with stage 1 through stage 4 chronic kidney disease, or unspecified chronic kidney disease: Secondary | ICD-10-CM | POA: Diagnosis not present

## 2019-03-06 DIAGNOSIS — I480 Paroxysmal atrial fibrillation: Secondary | ICD-10-CM | POA: Diagnosis not present

## 2019-03-06 DIAGNOSIS — I2510 Atherosclerotic heart disease of native coronary artery without angina pectoris: Secondary | ICD-10-CM | POA: Diagnosis not present

## 2019-03-06 DIAGNOSIS — Z9981 Dependence on supplemental oxygen: Secondary | ICD-10-CM | POA: Diagnosis not present

## 2019-03-06 DIAGNOSIS — R1312 Dysphagia, oropharyngeal phase: Secondary | ICD-10-CM | POA: Diagnosis not present

## 2019-03-06 DIAGNOSIS — N182 Chronic kidney disease, stage 2 (mild): Secondary | ICD-10-CM | POA: Diagnosis not present

## 2019-03-06 DIAGNOSIS — E876 Hypokalemia: Secondary | ICD-10-CM | POA: Diagnosis not present

## 2019-03-06 DIAGNOSIS — E538 Deficiency of other specified B group vitamins: Secondary | ICD-10-CM | POA: Diagnosis not present

## 2019-03-06 DIAGNOSIS — C3491 Malignant neoplasm of unspecified part of right bronchus or lung: Secondary | ICD-10-CM | POA: Diagnosis not present

## 2019-03-06 DIAGNOSIS — T66XXXD Radiation sickness, unspecified, subsequent encounter: Secondary | ICD-10-CM | POA: Diagnosis not present

## 2019-03-08 ENCOUNTER — Inpatient Hospital Stay (HOSPITAL_COMMUNITY): Payer: Medicare Other

## 2019-03-08 ENCOUNTER — Encounter (HOSPITAL_COMMUNITY): Payer: Self-pay

## 2019-03-08 ENCOUNTER — Inpatient Hospital Stay (HOSPITAL_BASED_OUTPATIENT_CLINIC_OR_DEPARTMENT_OTHER): Payer: Medicare Other | Admitting: Hematology

## 2019-03-08 ENCOUNTER — Other Ambulatory Visit: Payer: Self-pay

## 2019-03-08 ENCOUNTER — Inpatient Hospital Stay (HOSPITAL_COMMUNITY): Payer: Medicare Other | Attending: Hematology

## 2019-03-08 VITALS — BP 150/61 | HR 69 | Temp 97.3°F | Resp 18 | Wt 160.6 lb

## 2019-03-08 DIAGNOSIS — C3490 Malignant neoplasm of unspecified part of unspecified bronchus or lung: Secondary | ICD-10-CM

## 2019-03-08 DIAGNOSIS — Z87891 Personal history of nicotine dependence: Secondary | ICD-10-CM | POA: Insufficient documentation

## 2019-03-08 DIAGNOSIS — E860 Dehydration: Secondary | ICD-10-CM

## 2019-03-08 DIAGNOSIS — E876 Hypokalemia: Secondary | ICD-10-CM | POA: Insufficient documentation

## 2019-03-08 DIAGNOSIS — I2510 Atherosclerotic heart disease of native coronary artery without angina pectoris: Secondary | ICD-10-CM | POA: Insufficient documentation

## 2019-03-08 DIAGNOSIS — C3432 Malignant neoplasm of lower lobe, left bronchus or lung: Secondary | ICD-10-CM | POA: Insufficient documentation

## 2019-03-08 DIAGNOSIS — Z79899 Other long term (current) drug therapy: Secondary | ICD-10-CM | POA: Diagnosis not present

## 2019-03-08 DIAGNOSIS — Z950 Presence of cardiac pacemaker: Secondary | ICD-10-CM | POA: Insufficient documentation

## 2019-03-08 DIAGNOSIS — Z95828 Presence of other vascular implants and grafts: Secondary | ICD-10-CM

## 2019-03-08 DIAGNOSIS — I252 Old myocardial infarction: Secondary | ICD-10-CM | POA: Insufficient documentation

## 2019-03-08 DIAGNOSIS — Z7902 Long term (current) use of antithrombotics/antiplatelets: Secondary | ICD-10-CM | POA: Insufficient documentation

## 2019-03-08 DIAGNOSIS — R634 Abnormal weight loss: Secondary | ICD-10-CM | POA: Insufficient documentation

## 2019-03-08 LAB — CBC WITH DIFFERENTIAL/PLATELET
Abs Immature Granulocytes: 0.02 10*3/uL (ref 0.00–0.07)
Basophils Absolute: 0 10*3/uL (ref 0.0–0.1)
Basophils Relative: 0 %
Eosinophils Absolute: 0.1 10*3/uL (ref 0.0–0.5)
Eosinophils Relative: 1 %
HCT: 32.8 % — ABNORMAL LOW (ref 39.0–52.0)
Hemoglobin: 10.4 g/dL — ABNORMAL LOW (ref 13.0–17.0)
Immature Granulocytes: 0 %
Lymphocytes Relative: 7 %
Lymphs Abs: 0.4 10*3/uL — ABNORMAL LOW (ref 0.7–4.0)
MCH: 33.3 pg (ref 26.0–34.0)
MCHC: 31.7 g/dL (ref 30.0–36.0)
MCV: 105.1 fL — ABNORMAL HIGH (ref 80.0–100.0)
Monocytes Absolute: 0.3 10*3/uL (ref 0.1–1.0)
Monocytes Relative: 6 %
Neutro Abs: 4.4 10*3/uL (ref 1.7–7.7)
Neutrophils Relative %: 86 %
Platelets: 209 10*3/uL (ref 150–400)
RBC: 3.12 MIL/uL — ABNORMAL LOW (ref 4.22–5.81)
RDW: 13.7 % (ref 11.5–15.5)
WBC: 5.2 10*3/uL (ref 4.0–10.5)
nRBC: 0 % (ref 0.0–0.2)

## 2019-03-08 LAB — COMPREHENSIVE METABOLIC PANEL
ALT: 11 U/L (ref 0–44)
AST: 16 U/L (ref 15–41)
Albumin: 3.6 g/dL (ref 3.5–5.0)
Alkaline Phosphatase: 57 U/L (ref 38–126)
Anion gap: 12 (ref 5–15)
BUN: 19 mg/dL (ref 8–23)
CO2: 27 mmol/L (ref 22–32)
Calcium: 9.5 mg/dL (ref 8.9–10.3)
Chloride: 97 mmol/L — ABNORMAL LOW (ref 98–111)
Creatinine, Ser: 0.91 mg/dL (ref 0.61–1.24)
GFR calc Af Amer: >60 mL/min (ref >60)
GFR calc non Af Amer: >60 mL/min (ref >60)
Glucose, Bld: 118 mg/dL — ABNORMAL HIGH (ref 70–99)
Potassium: 3.1 mmol/L — ABNORMAL LOW (ref 3.5–5.1)
Sodium: 136 mmol/L (ref 135–145)
Total Bilirubin: 1.4 mg/dL — ABNORMAL HIGH (ref 0.3–1.2)
Total Protein: 7.3 g/dL (ref 6.5–8.1)

## 2019-03-08 MED ORDER — POTASSIUM CHLORIDE CRYS ER 20 MEQ PO TBCR: 20.0000 meq | EXTENDED_RELEASE_TABLET | Freq: Once | ORAL | Status: DC

## 2019-03-08 MED ORDER — POTASSIUM CHLORIDE CRYS ER 10 MEQ PO TBCR
EXTENDED_RELEASE_TABLET | ORAL | Status: AC
  Filled 2019-03-08: qty 2

## 2019-03-08 MED ORDER — POTASSIUM CHLORIDE 20 MEQ/15ML (10%) PO SOLN
20.0000 meq | Freq: Once | ORAL | Status: AC
  Administered 2019-03-08: 10:00:00 20 meq via ORAL
  Filled 2019-03-08: qty 15

## 2019-03-08 MED ORDER — HEPARIN SOD (PORK) LOCK FLUSH 100 UNIT/ML IV SOLN
500.0000 [IU] | Freq: Once | INTRAVENOUS | Status: AC | PRN
  Administered 2019-03-08: 500 [IU]

## 2019-03-08 MED ORDER — SODIUM CHLORIDE 0.9 % IV SOLN
Freq: Once | INTRAVENOUS | Status: AC
  Filled 2019-03-08: qty 1000

## 2019-03-08 MED ORDER — SODIUM CHLORIDE 0.9% FLUSH
10.0000 mL | Freq: Once | INTRAVENOUS | Status: AC | PRN
  Administered 2019-03-08: 10 mL

## 2019-03-08 NOTE — Progress Notes (Signed)
Labs reviewed at office visit today with patient and daughter. Will hold treatment today and give house fluids per orders.   Patient tolerated it well without problems. Vitals stable and discharged home from clinic via wheelchair. Follow up as scheduled.

## 2019-03-08 NOTE — Patient Instructions (Signed)
[facility] at [facility]  Discharge Instructions:   _______________________________________________________________  Thank you for choosing [facility] at [facility] to provide your oncology and hematology care.  To afford each patient quality time with our providers, please arrive at least 15 minutes before your scheduled appointment.  You need to re-schedule your appointment if you arrive 10 or more minutes late.  We strive to give you quality time with our providers, and arriving late affects you and other patients whose appointments are after yours.  Also, if you no show three or more times for appointments you may be dismissed from the clinic.  Again, thank you for choosing [facility] at [facility]. Our hope is that these requests will allow you access to exceptional care and in a timely manner. _______________________________________________________________  If you have questions after your visit, please contact our office at [phone] between the hours of 8:30 a.m. and 5:00 p.m. Voicemails left after 4:30 p.m. will not be returned until the following business day. _______________________________________________________________  For prescription refill requests, have your pharmacy contact our office. _______________________________________________________________  Recommendations made by the consultant and any test results will be sent to your referring physician. _______________________________________________________________ 

## 2019-03-08 NOTE — Progress Notes (Signed)
[facility], [place] [address]   CLINIC:  Medical Oncology/Hematology  PCP:  Sinda Du, [place] [address] [phone]   REASON FOR VISIT:  Follow-up for lung cancer.  CURRENT THERAPY: Consolidation immunotherapy.   CANCER STAGING: Cancer Staging Squamous cell carcinoma of lung (HCC) Staging form: Lung, AJCC 8th Edition - Clinical stage from 11/09/2018: Stage IIIB (cT1c, cN3, cM0) - Unsigned    INTERVAL HISTORY:  Nicholas Sanchez 86 y.o. male seen for follow-up of lung cancer.  He started consolidation durvalumab on 02/07/2019.  He received last treatment 2 weeks ago.  He reported 7 pound weight loss.  His eating has gone down last week.  He reports food getting stuck in the epigastric region which is improving in the last 2 days.  He is drinking boost 2 cans/day.  He complains of feeling tired.    REVIEW OF SYSTEMS:  Review of Systems  Constitutional: Positive for fatigue.  Gastrointestinal: Positive for constipation.  All other systems reviewed and are negative.    PAST MEDICAL/SURGICAL HISTORY:  Past Medical History:  Diagnosis Date  . Atherosclerotic heart disease of native coronary artery without angina pectoris   . Bradycardia, unspecified   . CAD (coronary artery disease)    a. STEMI 04/2008 s/p BMS to prox & distal RCA, staged DES to LAD same admission. b. Nuc 03/2017: scar but no ischemia, EF 45-54%. c. 09/2017: NSTEMI - DES x 2 to proximal and mid-RCA  . Cancer ([place])   . Chronic anticoagulation   . Chronic atrial fibrillation ([place])   . Chronic systolic (congestive) heart failure ([place])   . CKD (chronic kidney disease), stage II   . Cough, persistent   . GERD (gastroesophageal reflux disease)   . History of BPH   . HTN (hypertension)   . Hypercholesteremia   . LV dysfunction    a. EF 45-50% in 01/2016.  . Malignant neoplasm of unspecified part of unspecified bronchus or lung ([place])   . Mild  pulmonary hypertension ([place])   . Obesity, unspecified   . Other esophagitis without bleeding   . PAF (paroxysmal atrial fibrillation) ([place])   . Pneumonia, unspecified organism   . Port-A-Cath in place 11/21/2018  . Presence of cardiac pacemaker   . Presence of permanent cardiac pacemaker   . Sinus node dysfunction ([place])    a. s/p StJude CRT-pacemaker 01/2016.  [name] Tinnitus, bilateral   . Unspecified glaucoma    Past Surgical History:  Procedure Laterality Date  . BIOPSY  12/15/2018   Procedure: BIOPSY;  Surgeon: [name], MD;  [facility];  Service: Endoscopy;;  esophageal  . CARDIAC CATHETERIZATION    . COLONOSCOPY    . COLONOSCOPY N/A 08/27/2017   Dr. [name]: 25 5-25 mm polyps in descending colon, ascending, cecum. 2X3 cm carpet polyp in base of cecum lifted away s/p piecemeal polypectomy. Tubulovillous adenoma and tubular adenomas  . CORONARY STENT INTERVENTION N/A 10/22/2017   Procedure: CORONARY STENT INTERVENTION;  Surgeon: [name], MD;  Location: [facility];  Service: Cardiovascular;  Laterality: N/A;  . CORONARY STENT PLACEMENT  04/2008   RCA and LAD  . EP IMPLANTABLE DEVICE N/A 01/09/2016   Procedure: BiV Pacemaker Insertion CRT-P;  Surgeon: [name], MD;  Location: [facility];  Service: Cardiovascular;  Laterality: N/A;  . ESOPHAGOGASTRODUODENOSCOPY N/A 08/27/2017   normal esophagus s/p dilation, gastric petechia, small hiatal hernia, normal duodenum  .  ESOPHAGOGASTRODUODENOSCOPY (EGD) WITH PROPOFOL N/A 12/15/2018   Procedure: ESOPHAGOGASTRODUODENOSCOPY (EGD) WITH PROPOFOL;  Surgeon: [name], MD;  [facility];  Service: Endoscopy;  Laterality: N/A;  . INSERT / REPLACE / REMOVE PACEMAKER    . MALONEY DILATION N/A 08/27/2017   Procedure: [name] DILATION;  Surgeon: [name], MD;  [facility];  Service: Endoscopy;  Laterality: N/A;  . PORTACATH PLACEMENT Right 11/18/2018   Procedure: INSERTION  PORT-A-CATH;  Surgeon: [name], MD;  Location: [facility];  Service: General;  Laterality: Right;  . RIGHT/LEFT HEART CATH AND CORONARY ANGIOGRAPHY N/A 10/21/2017   Procedure: RIGHT/LEFT HEART CATH AND CORONARY ANGIOGRAPHY;  Surgeon: [name], MD;  Location: [facility];  Service: Cardiovascular;  Laterality: N/A;     SOCIAL HISTORY:  Social History   Socioeconomic History  . Marital status: Married    Spouse name: Air traffic controller  . Number of children: 3  . Years of education: Not on file  . Highest education level: Not on file  Occupational History  . Occupation: retired    Comment: Press photographer  Tobacco Use  . Smoking status: Former Smoker    Packs/day: 2.50    Years: 25.00    Pack years: 62.50    Types: Cigarettes    Quit date: 06/14/1973    Years since quitting: 45.7  . Smokeless tobacco: Former Systems developer    Types: Chew    Quit date: 06/14/1973  . Tobacco comment: some/chewed very little for about 2 years  Substance and Sexual Activity  . Alcohol use: No  . Drug use: No  . Sexual activity: Not on file  Other Topics Concern  . 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    Social Determinants of Health   Financial Resource Strain: Low Risk   . Difficulty of Paying Living Expenses: Not very hard  Food Insecurity: No Food Insecurity  . Worried About Charity fundraiser in the Last Year: Never true  . Ran Out of Food in the Last Year: Never true  Transportation Needs: No Transportation Needs  . Lack of Transportation (Medical): No  . Lack of Transportation (Non-Medical): No  Physical Activity: Inactive  . Days of Exercise per Week: 0 days  . Minutes of Exercise per Session: 0 min  Stress: No Stress Concern Present  . Feeling of Stress : Not at all  Social Connections: Slightly Isolated  . Frequency of Communication with Friends and Family: More than three times a week  . Frequency of Social Gatherings with Friends and Family:  More than three times a week  . Attends Religious Services: More than 4 times per year  . Active Member of Clubs or Organizations: No  . Attends Archivist Meetings: Never  . Marital Status: Married  Human resources officer Violence: Not At Risk  . Fear of Current or Ex-Partner: No  . Emotionally Abused: No  . Physically Abused: No  . Sexually Abused: No    FAMILY HISTORY:  Family History  Problem Relation Age of Onset  . Emphysema Mother        mother died with emphysema and cancer  . Cancer Mother   . Prostate cancer Brother   . Heart disease Brother   . Hypothyroidism Daughter   . Hypothyroidism Daughter   . Colon cancer Neg Hx   . Colon polyps Neg Hx     CURRENT MEDICATIONS:  Outpatient Encounter Medications  as of 03/08/2019  Medication Sig  . acetaminophen (TYLENOL) 500 MG tablet Take 500 mg by mouth every 6 (six) hours as needed for pain.  [name] aspirin EC 81 MG tablet Take 81 mg by mouth daily.  . cholecalciferol (VITAMIN D) 1000 units tablet Take 1,000 Units by mouth 2 (two) times daily.  . dorzolamide-timolol (COSOPT) 22.3-6.8 MG/ML ophthalmic solution Place 1 drop into the left eye 2 (two) times daily.  . hydrochlorothiazide (HYDRODIURIL) 25 MG tablet Take 25 mg by mouth daily.  . metoprolol succinate (TOPROL-XL) 50 MG 24 hr tablet Take 1 tablet (50 mg total) by mouth daily. Take with or immediately following a meal.  . nitroGLYCERIN (NITROSTAT) 0.4 MG SL tablet Place 0.4 mg under the tongue every 5 (five) minutes as needed for chest pain.  . pantoprazole (PROTONIX) 40 MG tablet Take 1 tablet (40 mg total) by mouth daily before breakfast.  . potassium chloride SA (KLOR-CON) 20 MEQ tablet Take 2 tablets (40 mEq total) by mouth 2 (two) times daily. For 2 days  . sucralfate (CARAFATE) 1 GM/10ML suspension Take 10 mLs (1 g total) by mouth 4 (four) times daily -  with meals and at bedtime.  . tamsulosin (FLOMAX) 0.4 MG CAPS capsule Take 0.4 mg by mouth daily.  . vitamin B-12  (CYANOCOBALAMIN) 1000 MCG tablet Take 1,000 mcg by mouth daily.   No facility-administered encounter medications on file as of 03/08/2019.    ALLERGIES:  Allergies  Allergen Reactions  . Ace Inhibitors Nausea Only  . Tape Rash    And Bandaids, too     PHYSICAL EXAM:  ECOG Performance status: 1  Vitals:   03/08/19 0816  BP: (!) 150/61  Pulse: 69  Resp: 18  Temp: (!) 97.3 F (36.3 C)  SpO2: 100%   Filed Weights   03/08/19 0816  Weight: 160 lb 9.6 oz (72.8 kg)    Physical Exam Vitals reviewed.  Constitutional:      Appearance: Normal appearance.  Cardiovascular:     Rate and Rhythm: Normal rate and regular rhythm.     Heart sounds: Normal heart sounds.  Pulmonary:     Effort: Pulmonary effort is normal.     Breath sounds: Normal breath sounds.  Abdominal:     General: There is no distension.     Palpations: Abdomen is soft. There is no mass.  Musculoskeletal:        General: No swelling.  Skin:    General: Skin is warm.  Neurological:     General: No focal deficit present.     Mental Status: He is alert and oriented to person, place, and time.  Psychiatric:        Mood and Affect: Mood normal.        Behavior: Behavior normal.      LABORATORY DATA:  I have reviewed the labs as listed.  CBC    Component Value Date/Time   WBC 5.2 03/08/2019 0826   RBC 3.12 (L) 03/08/2019 0826   HGB 10.4 (L) 03/08/2019 0826   HCT 32.8 (L) 03/08/2019 0826   PLT 209 03/08/2019 0826   MCV 105.1 (H) 03/08/2019 0826   MCH 33.3 03/08/2019 0826   MCHC 31.7 03/08/2019 0826   RDW 13.7 03/08/2019 0826   LYMPHSABS 0.4 (L) 03/08/2019 0826   MONOABS 0.3 03/08/2019 0826   EOSABS 0.1 03/08/2019 0826   BASOSABS 0.0 03/08/2019 0826   CMP Latest Ref Rng & Units 03/08/2019 02/21/2019 02/14/2019  Glucose 70 - 99  mg/dL 118(H) 167(H) 123(H)  BUN 8 - 23 mg/dL 19 17 13   Creatinine 0.61 - 1.24 mg/dL 0.91 0.85 0.85  Sodium 135 - 145 mmol/L 136 136 133(L)  Potassium 3.5 - 5.1 mmol/L  3.1(L) 3.4(L) 3.3(L)  Chloride 98 - 111 mmol/L 97(L) 97(L) 96(L)  CO2 22 - 32 mmol/L 27 28 29   Calcium 8.9 - 10.3 mg/dL 9.5 9.4 8.9  Total Protein 6.5 - 8.1 g/dL 7.3 7.2 7.2  Total Bilirubin 0.3 - 1.2 mg/dL 1.4(H) 0.8 1.5(H)  Alkaline Phos 38 - 126 U/L 57 57 60  AST 15 - 41 U/L 16 14(L) 13(L)  ALT 0 - 44 U/L 11 11 11        DIAGNOSTIC IMAGING:  I have independently reviewed the scans and discussed with the patient.    ASSESSMENT & PLAN:   Squamous cell carcinoma of lung ([place]) 1.  [name] squamous cell carcinoma left lung: -PET scan on 10/17/2018 showed hypermetabolic left lower lobe nodule, left hilar and contralateral right lower paratracheal lymph node along with right hilar lymph nodes.  Hypermetabolic subcarinal lymph nodes present.  Hypermetabolic lymph node in the porta hepatis with SUV 4.9, measuring 1.1 cm, favoring reactive adenopathy. -26 pound weight loss in the last 1 year, ex-smoker quit in 1975. -Left lower lobe lung biopsy on 10/31/2018 consistent with squamous cell carcinoma. - Weekly carbotaxol started on 11/21/2018 and radiation on 11/22/2018. -He received 3 weekly doses of carboplatin and paclitaxel, last dose on 12/05/2018.  Last XRT was on 12/09/2018. -He had prolonged hospitalization from 12/12/2018 through 12/26/2018 with severe radiation esophagitis and weakness.  He was subsequently discharged to a rehab facility.  He is currently been home since last 1 week.  He could not tolerate any further chemo or radiation. -CT chest with contrast on 01/25/2019 showed posterior left lower lobe cavitary nodule measuring 1.7 x 2.7 cm, previously 2.8 x 3.0 cm.  Improvement in size of the nodule meta stasis.  No new lesions were seen. -Durvalumab consolidation every 2 weeks started on 02/07/2019. -He denies any diarrhea, skin rashes or shortness of breath/cough.  However he reports more fatigue. -He lost 7 pounds in the last 2 weeks.  Hence I will hold his treatment today.  He will  receive 1 L of fluid with electrolytes. -I will reevaluate him in 1 week for treatment.  2.  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  Pacemaker was placed in November 2018. -Plavix on hold since hemoptysis.  He will continue aspirin daily.  3.  Hypokalemia: -He is on hydrochlorothiazide.  Potassium today is 3.1.  We will give him 40 mEq of potassium. -We will check potassium next week.  If it continues to be low, will start him on home potassium.  4.  Weight loss: -He lost 7 pounds in the last 2 weeks. -His eating has gone down last week.  He reports food stuck in the epigastric region.  Denies any nausea or vomiting.  Food sticking has gotten better in the last 2 days.  He is drinking about 2 cans of boost per day. -I will hold his chemotherapy today.     Orders placed this encounter:  Orders Placed This Encounter  Procedures  . CBC with Differential/Platelet  . Comprehensive metabolic panel      Derek Jack, MD [place] [phone]

## 2019-03-08 NOTE — Patient Instructions (Addendum)
[place] at [facility] Discharge Instructions  You were seen today by Dr. Delton Coombes. He went over your recent lab results. He will hold your treatment today. He will see you back in 1 week for labs and follow up.   Thank you for choosing [facility] at [facility] to provide your oncology and hematology care.  To afford each patient quality time with our provider, please arrive at least 15 minutes before your scheduled appointment time.   If you have a lab appointment with the [facility] please come in thru the  Main Entrance and check in at the main information desk  You need to re-schedule your appointment should you arrive 10 or more minutes late.  We strive to give you quality time with our providers, and arriving late affects you and other patients whose appointments are after yours.  Also, if you no show three or more times for appointments you may be dismissed from the clinic at the providers discretion.     Again, thank you for choosing [facility].  Our hope is that these requests will decrease the amount of time that you wait before being seen by our physicians.       _____________________________________________________________  Should you have questions after your visit to [facility], please contact our office at [phone] between the hours of 8:00 a.m. and 4:30 p.m.  Voicemails left after 4:00 p.m. will not be returned until the following business day.  For prescription refill requests, have your pharmacy contact our office and allow 72 hours.    Cancer Center Support Programs:   > Cancer Support Group  2nd Tuesday of the month 1pm-2pm, Journey Room

## 2019-03-10 DIAGNOSIS — E559 Vitamin D deficiency, unspecified: Secondary | ICD-10-CM | POA: Diagnosis not present

## 2019-03-10 DIAGNOSIS — H409 Unspecified glaucoma: Secondary | ICD-10-CM | POA: Diagnosis not present

## 2019-03-10 DIAGNOSIS — E538 Deficiency of other specified B group vitamins: Secondary | ICD-10-CM | POA: Diagnosis not present

## 2019-03-10 DIAGNOSIS — I129 Hypertensive chronic kidney disease with stage 1 through stage 4 chronic kidney disease, or unspecified chronic kidney disease: Secondary | ICD-10-CM | POA: Diagnosis not present

## 2019-03-10 DIAGNOSIS — R339 Retention of urine, unspecified: Secondary | ICD-10-CM | POA: Diagnosis not present

## 2019-03-10 DIAGNOSIS — K2100 Gastro-esophageal reflux disease with esophagitis, without bleeding: Secondary | ICD-10-CM | POA: Diagnosis not present

## 2019-03-10 DIAGNOSIS — N182 Chronic kidney disease, stage 2 (mild): Secondary | ICD-10-CM | POA: Diagnosis not present

## 2019-03-10 DIAGNOSIS — E876 Hypokalemia: Secondary | ICD-10-CM | POA: Diagnosis not present

## 2019-03-10 DIAGNOSIS — R1312 Dysphagia, oropharyngeal phase: Secondary | ICD-10-CM | POA: Diagnosis not present

## 2019-03-10 DIAGNOSIS — N179 Acute kidney failure, unspecified: Secondary | ICD-10-CM | POA: Diagnosis not present

## 2019-03-10 DIAGNOSIS — Z452 Encounter for adjustment and management of vascular access device: Secondary | ICD-10-CM | POA: Diagnosis not present

## 2019-03-10 DIAGNOSIS — Y842 Radiological procedure and radiotherapy as the cause of abnormal reaction of the patient, or of later complication, without mention of misadventure at the time of the procedure: Secondary | ICD-10-CM | POA: Diagnosis not present

## 2019-03-10 DIAGNOSIS — I2510 Atherosclerotic heart disease of native coronary artery without angina pectoris: Secondary | ICD-10-CM | POA: Diagnosis not present

## 2019-03-10 DIAGNOSIS — I480 Paroxysmal atrial fibrillation: Secondary | ICD-10-CM | POA: Diagnosis not present

## 2019-03-10 DIAGNOSIS — C3491 Malignant neoplasm of unspecified part of right bronchus or lung: Secondary | ICD-10-CM | POA: Diagnosis not present

## 2019-03-10 DIAGNOSIS — E7800 Pure hypercholesterolemia, unspecified: Secondary | ICD-10-CM | POA: Diagnosis not present

## 2019-03-10 DIAGNOSIS — T66XXXD Radiation sickness, unspecified, subsequent encounter: Secondary | ICD-10-CM | POA: Diagnosis not present

## 2019-03-10 DIAGNOSIS — Z9981 Dependence on supplemental oxygen: Secondary | ICD-10-CM | POA: Diagnosis not present

## 2019-03-10 DIAGNOSIS — K5900 Constipation, unspecified: Secondary | ICD-10-CM | POA: Diagnosis not present

## 2019-03-13 ENCOUNTER — Encounter (HOSPITAL_COMMUNITY): Payer: Self-pay | Admitting: Hematology

## 2019-03-13 DIAGNOSIS — T66XXXD Radiation sickness, unspecified, subsequent encounter: Secondary | ICD-10-CM | POA: Diagnosis not present

## 2019-03-13 DIAGNOSIS — N182 Chronic kidney disease, stage 2 (mild): Secondary | ICD-10-CM | POA: Diagnosis not present

## 2019-03-13 DIAGNOSIS — R1312 Dysphagia, oropharyngeal phase: Secondary | ICD-10-CM | POA: Diagnosis not present

## 2019-03-13 DIAGNOSIS — H409 Unspecified glaucoma: Secondary | ICD-10-CM | POA: Diagnosis not present

## 2019-03-13 DIAGNOSIS — E876 Hypokalemia: Secondary | ICD-10-CM | POA: Diagnosis not present

## 2019-03-13 DIAGNOSIS — Z9981 Dependence on supplemental oxygen: Secondary | ICD-10-CM | POA: Diagnosis not present

## 2019-03-13 DIAGNOSIS — I2510 Atherosclerotic heart disease of native coronary artery without angina pectoris: Secondary | ICD-10-CM | POA: Diagnosis not present

## 2019-03-13 DIAGNOSIS — E7800 Pure hypercholesterolemia, unspecified: Secondary | ICD-10-CM | POA: Diagnosis not present

## 2019-03-13 DIAGNOSIS — I129 Hypertensive chronic kidney disease with stage 1 through stage 4 chronic kidney disease, or unspecified chronic kidney disease: Secondary | ICD-10-CM | POA: Diagnosis not present

## 2019-03-13 DIAGNOSIS — E559 Vitamin D deficiency, unspecified: Secondary | ICD-10-CM | POA: Diagnosis not present

## 2019-03-13 DIAGNOSIS — Y842 Radiological procedure and radiotherapy as the cause of abnormal reaction of the patient, or of later complication, without mention of misadventure at the time of the procedure: Secondary | ICD-10-CM | POA: Diagnosis not present

## 2019-03-13 DIAGNOSIS — N179 Acute kidney failure, unspecified: Secondary | ICD-10-CM | POA: Diagnosis not present

## 2019-03-13 DIAGNOSIS — K2100 Gastro-esophageal reflux disease with esophagitis, without bleeding: Secondary | ICD-10-CM | POA: Diagnosis not present

## 2019-03-13 DIAGNOSIS — E538 Deficiency of other specified B group vitamins: Secondary | ICD-10-CM | POA: Diagnosis not present

## 2019-03-13 DIAGNOSIS — C3491 Malignant neoplasm of unspecified part of right bronchus or lung: Secondary | ICD-10-CM | POA: Diagnosis not present

## 2019-03-13 DIAGNOSIS — I480 Paroxysmal atrial fibrillation: Secondary | ICD-10-CM | POA: Diagnosis not present

## 2019-03-13 DIAGNOSIS — R339 Retention of urine, unspecified: Secondary | ICD-10-CM | POA: Diagnosis not present

## 2019-03-13 DIAGNOSIS — Z452 Encounter for adjustment and management of vascular access device: Secondary | ICD-10-CM | POA: Diagnosis not present

## 2019-03-13 DIAGNOSIS — K5900 Constipation, unspecified: Secondary | ICD-10-CM | POA: Diagnosis not present

## 2019-03-13 NOTE — Assessment & Plan Note (Addendum)
1.  Nicholas Sanchez squamous cell carcinoma left lung: -PET scan on 10/17/2018 showed hypermetabolic left lower lobe nodule, left hilar and contralateral right lower paratracheal lymph node along with right hilar lymph nodes.  Hypermetabolic subcarinal lymph nodes present.  Hypermetabolic lymph node in the porta hepatis with SUV 4.9, measuring 1.1 cm, favoring reactive adenopathy. -26 pound weight loss in the last 1 year, ex-smoker quit in 1975. -Left lower lobe lung biopsy on 10/31/2018 consistent with squamous cell carcinoma. - Weekly carbotaxol started on 11/21/2018 and radiation on 11/22/2018. -He received 3 weekly doses of carboplatin and paclitaxel, last dose on 12/05/2018.  Last XRT was on 12/09/2018. -He had prolonged hospitalization from 12/12/2018 through 12/26/2018 with severe radiation esophagitis and weakness.  He was subsequently discharged to a rehab facility.  He is currently been home since last 1 week.  He could not tolerate any further chemo or radiation. -CT chest with contrast on 01/25/2019 showed posterior left lower lobe cavitary nodule measuring 1.7 x 2.7 cm, previously 2.8 x 3.0 cm.  Improvement in size of the nodule meta stasis.  No new lesions were seen. -Durvalumab consolidation every 2 weeks started on 02/07/2019. -He denies any diarrhea, skin rashes or shortness of breath/cough.  However he reports more fatigue. -He lost 7 pounds in the last 2 weeks.  Hence I will hold his treatment today.  He will receive 1 L of fluid with electrolytes. -I will reevaluate him in 1 week for treatment.  2.  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  Pacemaker was placed in November 2018. -Plavix on hold since hemoptysis.  He will continue aspirin daily.  3.  Hypokalemia: -He is on hydrochlorothiazide.  Potassium today is 3.1.  We will give him 40 mEq of potassium. -We will check potassium next week.  If it continues to be low, will start him on home potassium.  4.  Weight loss: -He lost 7 pounds in the  last 2 weeks. -His eating has gone down last week.  He reports food stuck in the epigastric region.  Denies any nausea or vomiting.  Food sticking has gotten better in the last 2 days.  He is drinking about 2 cans of boost per day. -I will hold his chemotherapy today.

## 2019-03-15 ENCOUNTER — Inpatient Hospital Stay (HOSPITAL_COMMUNITY): Payer: Medicare Other

## 2019-03-15 ENCOUNTER — Encounter (HOSPITAL_COMMUNITY): Payer: Self-pay | Admitting: Hematology

## 2019-03-15 ENCOUNTER — Other Ambulatory Visit: Payer: Self-pay

## 2019-03-15 ENCOUNTER — Inpatient Hospital Stay (HOSPITAL_BASED_OUTPATIENT_CLINIC_OR_DEPARTMENT_OTHER): Payer: Medicare Other | Admitting: Hematology

## 2019-03-15 VITALS — BP 130/56 | HR 70 | Temp 97.3°F | Resp 18 | Wt 158.0 lb

## 2019-03-15 VITALS — BP 130/59 | HR 70 | Temp 97.5°F | Resp 18

## 2019-03-15 DIAGNOSIS — Z79899 Other long term (current) drug therapy: Secondary | ICD-10-CM | POA: Diagnosis not present

## 2019-03-15 DIAGNOSIS — E876 Hypokalemia: Secondary | ICD-10-CM | POA: Diagnosis not present

## 2019-03-15 DIAGNOSIS — I252 Old myocardial infarction: Secondary | ICD-10-CM | POA: Diagnosis not present

## 2019-03-15 DIAGNOSIS — Z950 Presence of cardiac pacemaker: Secondary | ICD-10-CM | POA: Diagnosis not present

## 2019-03-15 DIAGNOSIS — E860 Dehydration: Secondary | ICD-10-CM

## 2019-03-15 DIAGNOSIS — Z87891 Personal history of nicotine dependence: Secondary | ICD-10-CM | POA: Diagnosis not present

## 2019-03-15 DIAGNOSIS — I2510 Atherosclerotic heart disease of native coronary artery without angina pectoris: Secondary | ICD-10-CM | POA: Diagnosis not present

## 2019-03-15 DIAGNOSIS — C3490 Malignant neoplasm of unspecified part of unspecified bronchus or lung: Secondary | ICD-10-CM | POA: Diagnosis not present

## 2019-03-15 DIAGNOSIS — C3432 Malignant neoplasm of lower lobe, left bronchus or lung: Secondary | ICD-10-CM | POA: Diagnosis not present

## 2019-03-15 DIAGNOSIS — Z7902 Long term (current) use of antithrombotics/antiplatelets: Secondary | ICD-10-CM | POA: Diagnosis not present

## 2019-03-15 LAB — CBC WITH DIFFERENTIAL/PLATELET
Abs Immature Granulocytes: 0.02 10*3/uL (ref 0.00–0.07)
Basophils Absolute: 0 10*3/uL (ref 0.0–0.1)
Basophils Relative: 0 %
Eosinophils Absolute: 0.1 10*3/uL (ref 0.0–0.5)
Eosinophils Relative: 1 %
HCT: 34.3 % — ABNORMAL LOW (ref 39.0–52.0)
Hemoglobin: 10.9 g/dL — ABNORMAL LOW (ref 13.0–17.0)
Immature Granulocytes: 0 %
Lymphocytes Relative: 6 %
Lymphs Abs: 0.4 10*3/uL — ABNORMAL LOW (ref 0.7–4.0)
MCH: 33.3 pg (ref 26.0–34.0)
MCHC: 31.8 g/dL (ref 30.0–36.0)
MCV: 104.9 fL — ABNORMAL HIGH (ref 80.0–100.0)
Monocytes Absolute: 0.5 10*3/uL (ref 0.1–1.0)
Monocytes Relative: 8 %
Neutro Abs: 5.1 10*3/uL (ref 1.7–7.7)
Neutrophils Relative %: 85 %
Platelets: 196 10*3/uL (ref 150–400)
RBC: 3.27 MIL/uL — ABNORMAL LOW (ref 4.22–5.81)
RDW: 13.9 % (ref 11.5–15.5)
WBC: 6.1 10*3/uL (ref 4.0–10.5)
nRBC: 0 % (ref 0.0–0.2)

## 2019-03-15 LAB — COMPREHENSIVE METABOLIC PANEL
ALT: 11 U/L (ref 0–44)
AST: 15 U/L (ref 15–41)
Albumin: 3.7 g/dL (ref 3.5–5.0)
Alkaline Phosphatase: 57 U/L (ref 38–126)
Anion gap: 10 (ref 5–15)
BUN: 17 mg/dL (ref 8–23)
CO2: 28 mmol/L (ref 22–32)
Calcium: 9.7 mg/dL (ref 8.9–10.3)
Chloride: 96 mmol/L — ABNORMAL LOW (ref 98–111)
Creatinine, Ser: 0.84 mg/dL (ref 0.61–1.24)
GFR calc Af Amer: >60 mL/min (ref >60)
GFR calc non Af Amer: >60 mL/min (ref >60)
Glucose, Bld: 144 mg/dL — ABNORMAL HIGH (ref 70–99)
Potassium: 3.4 mmol/L — ABNORMAL LOW (ref 3.5–5.1)
Sodium: 134 mmol/L — ABNORMAL LOW (ref 135–145)
Total Bilirubin: 1.2 mg/dL (ref 0.3–1.2)
Total Protein: 7.4 g/dL (ref 6.5–8.1)

## 2019-03-15 MED ORDER — SODIUM CHLORIDE 0.9 % IV SOLN
Freq: Once | INTRAVENOUS | Status: AC
  Filled 2019-03-15: qty 1000

## 2019-03-15 MED ORDER — HEPARIN SOD (PORK) LOCK FLUSH 100 UNIT/ML IV SOLN
500.0000 [IU] | Freq: Once | INTRAVENOUS | Status: AC | PRN
  Administered 2019-03-15: 500 [IU]

## 2019-03-15 MED ORDER — FLUCONAZOLE 100 MG PO TABS: 100.0000 mg | ORAL_TABLET | Freq: Every day | ORAL | 0 refills | Status: DC

## 2019-03-15 MED ORDER — POTASSIUM CHLORIDE 20 MEQ/15ML (10%) PO SOLN: 20.0000 meq | Freq: Every day | ORAL | 0 refills | Status: DC

## 2019-03-15 MED ORDER — SODIUM CHLORIDE 0.9% FLUSH
10.0000 mL | Freq: Once | INTRAVENOUS | Status: AC | PRN
  Administered 2019-03-15: 09:00:00 10 mL

## 2019-03-15 NOTE — Assessment & Plan Note (Addendum)
1.  [name] squamous cell carcinoma left lung: -PET scan on 10/17/2018 showed hypermetabolic left lower lobe nodule, left hilar and contralateral right lower paratracheal lymph node along with right hilar lymph nodes.  Hypermetabolic subcarinal lymph nodes present.  Hypermetabolic lymph node in the porta hepatis with SUV 4.9, measuring 1.1 cm, favoring reactive adenopathy. -26 pound weight loss in the last 1 year, ex-smoker quit in 1975. -Left lower lobe lung biopsy on 10/31/2018 consistent with squamous cell carcinoma. - Weekly carbotaxol started on 11/21/2018 and radiation on 11/22/2018. -He received 3 weekly doses of carboplatin and paclitaxel, last dose on 12/05/2018.  Last XRT was on 12/09/2018. -He had prolonged hospitalization from 12/12/2018 through 12/26/2018 with severe radiation esophagitis and weakness.  He was subsequently discharged to a rehab facility.  He is currently been home since last 1 week.  He could not tolerate any further chemo or radiation. -CT chest with contrast on 01/25/2019 showed posterior left lower lobe cavitary nodule measuring 1.7 x 2.7 cm, previously 2.8 x 3.0 cm.  Improvement in size of the nodule meta stasis.  No new lesions were seen. -Durvalumab consolidation every 2 weeks started on 02/07/2019. -He denied any immunotherapy related side effects.  However he lost 2 pounds since last week. -Hence I will hold his treatment today.  We will give him some fluids. -I will plan to order a CT scan of the chest with contrast.  I will see him back in next week.  2.  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  Pacemaker was placed in November 2018. -Plavix on hold since hemoptysis.  He will continue aspirin daily.  3.  Hypokalemia: -His potassium today is 3.4.  We will give him 20 mEq of potassium today in the liquid form. -I will start him on K-Lor 20 mEq daily.  4.  Weight loss: -He lost 9 pounds in the last 3 weeks. -She reports drinking 2 cans of boost per day. -I will hold  his treatment today.  He reports food getting stuck occasionally behind the sternum. -I will start him on empiric Diflucan 100 mg for 7 days.

## 2019-03-15 NOTE — Progress Notes (Signed)
[place], [place] [address]   CLINIC:  Medical Oncology/Hematology  PCP:  Sinda Du, [place] [id number] [phone]   REASON FOR VISIT:  Follow-up for lung cancer.  CURRENT THERAPY: Consolidation immunotherapy.   CANCER STAGING: Cancer Staging Squamous cell carcinoma of lung (HCC) Staging form: Lung, AJCC 8th Edition - Clinical stage from 11/09/2018: Stage IIIB (cT1c, cN3, cM0) - Unsigned    INTERVAL HISTORY:  [name] 86 y.o. male seen for follow-up of lung cancer.  His durvalumab was held last week secondary to weight loss.  Since last week he lost about 2 pounds.  Appetite and energy levels are 50%.  Chronic cough is stable.  Reports food getting stuck behind sternum.  Drinking 2 cans of boost per day.    REVIEW OF SYSTEMS:  Review of Systems  Constitutional: Positive for fatigue.  Respiratory: Positive for cough.   All other systems reviewed and are negative.    PAST MEDICAL/SURGICAL HISTORY:  Past Medical History:  Diagnosis Date  . Atherosclerotic heart disease of native coronary artery without angina pectoris   . Bradycardia, unspecified   . CAD (coronary artery disease)    a. STEMI 04/2008 s/p BMS to prox & distal RCA, staged DES to LAD same admission. b. Nuc 03/2017: scar but no ischemia, EF 45-54%. c. 09/2017: NSTEMI - DES x 2 to proximal and mid-RCA  . Cancer ([place])   . Chronic anticoagulation   . Chronic atrial fibrillation ([name])   . Chronic systolic (congestive) heart failure ([name])   . CKD (chronic kidney disease), stage II   . Cough, persistent   . GERD (gastroesophageal reflux disease)   . History of BPH   . HTN (hypertension)   . Hypercholesteremia   . LV dysfunction    a. EF 45-50% in 01/2016.  . Malignant neoplasm of unspecified part of unspecified bronchus or lung ([place])   . Mild pulmonary hypertension ([place])   . Obesity, unspecified   . Other esophagitis without  bleeding   . PAF (paroxysmal atrial fibrillation) ([name])   . Pneumonia, unspecified organism   . Port-A-Cath in place 11/21/2018  . Presence of cardiac pacemaker   . Presence of permanent cardiac pacemaker   . Sinus node dysfunction ([name])    a. s/p StJude CRT-pacemaker 01/2016.  [name] Tinnitus, bilateral   . Unspecified glaucoma    Past Surgical History:  Procedure Laterality Date  . BIOPSY  12/15/2018   Procedure: BIOPSY;  Surgeon: [name], MD;  [facility];  Service: Endoscopy;;  esophageal  . CARDIAC CATHETERIZATION    . COLONOSCOPY    . COLONOSCOPY N/A 08/27/2017   Dr. [name]: 25 5-25 mm polyps in descending colon, ascending, cecum. 2X3 cm carpet polyp in base of cecum lifted away s/p piecemeal polypectomy. Tubulovillous adenoma and tubular adenomas  . CORONARY STENT INTERVENTION N/A 10/22/2017   Procedure: CORONARY STENT INTERVENTION;  Surgeon: [name], MD;  Location: [facility];  Service: Cardiovascular;  Laterality: N/A;  . CORONARY STENT PLACEMENT  04/2008   RCA and LAD  . EP IMPLANTABLE DEVICE N/A 01/09/2016   Procedure: BiV Pacemaker Insertion CRT-P;  Surgeon: [name], MD;  Location: [facility];  Service: Cardiovascular;  Laterality: N/A;  . ESOPHAGOGASTRODUODENOSCOPY N/A 08/27/2017   normal esophagus s/p dilation, gastric petechia, small hiatal hernia, normal duodenum  . ESOPHAGOGASTRODUODENOSCOPY (EGD) WITH PROPOFOL N/A 12/15/2018   Procedure: ESOPHAGOGASTRODUODENOSCOPY (EGD) WITH PROPOFOL;  Surgeon: [name], MD;  [facility];  Service: Endoscopy;  Laterality: N/A;  . INSERT / REPLACE / REMOVE PACEMAKER    . MALONEY DILATION N/A 08/27/2017   Procedure: [name] DILATION;  Surgeon: [name], MD;  [facility];  Service: Endoscopy;  Laterality: N/A;  . PORTACATH PLACEMENT Right 11/18/2018   Procedure: INSERTION PORT-A-CATH;  Surgeon: [name], MD;  Location: [facility];  Service: General;   Laterality: Right;  . RIGHT/LEFT HEART CATH AND CORONARY ANGIOGRAPHY N/A 10/21/2017   Procedure: RIGHT/LEFT HEART CATH AND CORONARY ANGIOGRAPHY;  Surgeon: [name], MD;  Location: [facility];  Service: Cardiovascular;  Laterality: N/A;     SOCIAL HISTORY:  Social History   Socioeconomic History  . Marital status: Married    Spouse name: Air traffic controller  . Number of children: 3  . Years of education: Not on file  . Highest education level: Not on file  Occupational History  . Occupation: retired    Comment: Press photographer  Tobacco Use  . Smoking status: Former Smoker    Packs/day: 2.50    Years: 25.00    Pack years: 62.50    Types: Cigarettes    Quit date: 06/14/1973    Years since quitting: 45.7  . Smokeless tobacco: Former Systems developer    Types: Chew    Quit date: 06/14/1973  . Tobacco comment: some/chewed very little for about 2 years  Substance and Sexual Activity  . Alcohol use: No  . Drug use: No  . Sexual activity: Not on file  Other Topics Concern  . 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    Social Determinants of Health   Financial Resource Strain: Low Risk   . Difficulty of Paying Living Expenses: Not very hard  Food Insecurity: No Food Insecurity  . Worried About Charity fundraiser in the Last Year: Never true  . Ran Out of Food in the Last Year: Never true  Transportation Needs: No Transportation Needs  . Lack of Transportation (Medical): No  . Lack of Transportation (Non-Medical): No  Physical Activity: Inactive  . Days of Exercise per Week: 0 days  . Minutes of Exercise per Session: 0 min  Stress: No Stress Concern Present  . Feeling of Stress : Not at all  Social Connections: Slightly Isolated  . Frequency of Communication with Friends and Family: More than three times a week  . Frequency of Social Gatherings with Friends and Family: More than three times a week  . Attends Religious Services: More than 4 times per  year  . Active Member of Clubs or Organizations: No  . Attends Archivist Meetings: Never  . Marital Status: Married  Human resources officer Violence: Not At Risk  . Fear of Current or Ex-Partner: No  . Emotionally Abused: No  . Physically Abused: No  . Sexually Abused: No    FAMILY HISTORY:  Family History  Problem Relation Age of Onset  . Emphysema Mother        mother died with emphysema and cancer  . Cancer Mother   . Prostate cancer Brother   . Heart disease Brother   . Hypothyroidism Daughter   . Hypothyroidism Daughter   . Colon cancer Neg Hx   . Colon polyps Neg Hx     CURRENT MEDICATIONS:  Outpatient Encounter Medications as of 03/15/2019  Medication Sig  . aspirin EC 81 MG tablet Take  81 mg by mouth daily.  . cholecalciferol (VITAMIN D) 1000 units tablet Take 1,000 Units by mouth 2 (two) times daily.  . dorzolamide-timolol (COSOPT) 22.3-6.8 MG/ML ophthalmic solution Place 1 drop into the left eye 2 (two) times daily.  . hydrochlorothiazide (HYDRODIURIL) 25 MG tablet Take 25 mg by mouth daily.  . metoprolol succinate (TOPROL-XL) 50 MG 24 hr tablet Take 1 tablet (50 mg total) by mouth daily. Take with or immediately following a meal.  . pantoprazole (PROTONIX) 40 MG tablet Take 1 tablet (40 mg total) by mouth daily before breakfast.  . potassium chloride SA (KLOR-CON) 20 MEQ tablet Take 2 tablets (40 mEq total) by mouth 2 (two) times daily. For 2 days  . sucralfate (CARAFATE) 1 GM/10ML suspension Take 10 mLs (1 g total) by mouth 4 (four) times daily -  with meals and at bedtime.  . tamsulosin (FLOMAX) 0.4 MG CAPS capsule Take 0.4 mg by mouth daily.  . vitamin B-12 (CYANOCOBALAMIN) 1000 MCG tablet Take 1,000 mcg by mouth daily.  [name] acetaminophen (TYLENOL) 500 MG tablet Take 500 mg by mouth every 6 (six) hours as needed for pain.  . fluconazole (DIFLUCAN) 100 MG tablet Take 1 tablet (100 mg total) by mouth daily.  . nitroGLYCERIN (NITROSTAT) 0.4 MG SL tablet Place 0.4  mg under the tongue every 5 (five) minutes as needed for chest pain.  . potassium chloride 20 MEQ/15ML (10%) SOLN Take 15 mLs (20 mEq total) by mouth daily.   No facility-administered encounter medications on file as of 03/15/2019.    ALLERGIES:  Allergies  Allergen Reactions  . Ace Inhibitors Nausea Only  . Tape Rash    And Bandaids, too     PHYSICAL EXAM:  ECOG Performance status: 1  Vitals:   03/15/19 0836  BP: (!) 130/56  Pulse: 70  Resp: 18  Temp: (!) 97.3 F (36.3 C)  SpO2: 98%   Filed Weights   03/15/19 0835 03/15/19 0836  Weight: 158 lb (71.7 kg) 158 lb (71.7 kg)    Physical Exam Vitals reviewed.  Constitutional:      Appearance: Normal appearance.  Cardiovascular:     Rate and Rhythm: Normal rate and regular rhythm.     Heart sounds: Normal heart sounds.  Pulmonary:     Effort: Pulmonary effort is normal.     Breath sounds: Normal breath sounds.  Abdominal:     General: There is no distension.     Palpations: Abdomen is soft. There is no mass.  Musculoskeletal:        General: No swelling.  Skin:    General: Skin is warm.  Neurological:     General: No focal deficit present.     Mental Status: He is alert and oriented to person, place, and time.  Psychiatric:        Mood and Affect: Mood normal.        Behavior: Behavior normal.      LABORATORY DATA:  I have reviewed the labs as listed.  CBC    Component Value Date/Time   WBC 6.1 03/15/2019 0831   RBC 3.27 (L) 03/15/2019 0831   HGB 10.9 (L) 03/15/2019 0831   HCT 34.3 (L) 03/15/2019 0831   PLT 196 03/15/2019 0831   MCV 104.9 (H) 03/15/2019 0831   MCH 33.3 03/15/2019 0831   MCHC 31.8 03/15/2019 0831   RDW 13.9 03/15/2019 0831   LYMPHSABS 0.4 (L) 03/15/2019 0831   MONOABS 0.5 03/15/2019 0831   EOSABS 0.1 03/15/2019  0831   BASOSABS 0.0 03/15/2019 0831   CMP Latest Ref Rng & Units 03/15/2019 03/08/2019 02/21/2019  Glucose 70 - 99 mg/dL 144(H) 118(H) 167(H)  BUN 8 - 23 mg/dL 17 19 17     Creatinine 0.61 - 1.24 mg/dL 0.84 0.91 0.85  Sodium 135 - 145 mmol/L 134(L) 136 136  Potassium 3.5 - 5.1 mmol/L 3.4(L) 3.1(L) 3.4(L)  Chloride 98 - 111 mmol/L 96(L) 97(L) 97(L)  CO2 22 - 32 mmol/L 28 27 28   Calcium 8.9 - 10.3 mg/dL 9.7 9.5 9.4  Total Protein 6.5 - 8.1 g/dL 7.4 7.3 7.2  Total Bilirubin 0.3 - 1.2 mg/dL 1.2 1.4(H) 0.8  Alkaline Phos 38 - 126 U/L 57 57 57  AST 15 - 41 U/L 15 16 14(L)  ALT 0 - 44 U/L 11 11 11        DIAGNOSTIC IMAGING:  I have independently reviewed the scans and discussed with the patient.    ASSESSMENT & PLAN:   Squamous cell carcinoma of lung ([facility] [facility]) 1.  [name] squamous cell carcinoma left lung: -PET scan on 10/17/2018 showed hypermetabolic left lower lobe nodule, left hilar and contralateral right lower paratracheal lymph node along with right hilar lymph nodes.  Hypermetabolic subcarinal lymph nodes present.  Hypermetabolic lymph node in the porta hepatis with SUV 4.9, measuring 1.1 cm, favoring reactive adenopathy. -26 pound weight loss in the last 1 year, ex-smoker quit in 1975. -Left lower lobe lung biopsy on 10/31/2018 consistent with squamous cell carcinoma. - Weekly carbotaxol started on 11/21/2018 and radiation on 11/22/2018. -He received 3 weekly doses of carboplatin and paclitaxel, last dose on 12/05/2018.  Last XRT was on 12/09/2018. -He had prolonged hospitalization from 12/12/2018 through 12/26/2018 with severe radiation esophagitis and weakness.  He was subsequently discharged to a rehab facility.  He is currently been home since last 1 week.  He could not tolerate any further chemo or radiation. -CT chest with contrast on 01/25/2019 showed posterior left lower lobe cavitary nodule measuring 1.7 x 2.7 cm, previously 2.8 x 3.0 cm.  Improvement in size of the nodule meta stasis.  No new lesions were seen. -Durvalumab consolidation every 2 weeks started on 02/07/2019. -He denied any immunotherapy related side effects.  However he lost 2 pounds since  last week. -Hence I will hold his treatment today.  We will give him some fluids. -I will plan to order a CT scan of the chest with contrast.  I will see him back in next week.  2.  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  Pacemaker was placed in November 2018. -Plavix on hold since hemoptysis.  He will continue aspirin daily.  3.  Hypokalemia: -His potassium today is 3.4.  We will give him 20 mEq of potassium today in the liquid form. -I will start him on K-Lor 20 mEq daily.  4.  Weight loss: -He lost 9 pounds in the last 3 weeks. -She reports drinking 2 cans of boost per day. -I will hold his treatment today.  He reports food getting stuck occasionally behind the sternum. -I will start him on empiric Diflucan 100 mg for 7 days.     Orders placed this encounter:  Orders Placed This Encounter  Procedures  . CT Chest W Contrast  . CBC with Differential/Platelet  . Comprehensive metabolic panel      Derek Jack, MD [place] [phone]

## 2019-03-15 NOTE — Patient Instructions (Signed)
[place] at [facility] Discharge Instructions  Received IV hydration with magnesium and potassium today. Follow-up as scheduled. Call clinic for any questions or concerns   Thank you for choosing [place] at [facility] to provide your oncology and hematology care.  To afford each patient quality time with our provider, please arrive at least 15 minutes before your scheduled appointment time.   If you have a lab appointment with the [facility] please come in thru the Main Entrance and check in at the main information desk.  You need to re-schedule your appointment should you arrive 10 or more minutes late.  We strive to give you quality time with our providers, and arriving late affects you and other patients whose appointments are after yours.  Also, if you no show three or more times for appointments you may be dismissed from the clinic at the providers discretion.     Again, thank you for choosing [facility].  Our hope is that these requests will decrease the amount of time that you wait before being seen by our physicians.       _____________________________________________________________  Should you have questions after your visit to [facility], please contact our office at [phone] between the hours of 8:00 a.m. and 4:30 p.m.  Voicemails left after 4:00 p.m. will not be returned until the following business day.  For prescription refill requests, have your pharmacy contact our office and allow 72 hours.    Due to Covid, you will need to wear a mask upon entering the hospital. If you do not have a mask, a mask will be given to you at the Main Entrance upon arrival. For doctor visits, patients may have 1 support person with them. For treatment visits, patients can not have anyone with them due to social distancing guidelines and our immunocompromised population.

## 2019-03-15 NOTE — Progress Notes (Signed)
0950 Labs reviewed with and pt seen by Dr. Delton Coombes and pt to receive only IV hydration with magnesium and potassium today over 2 hours with Imfinzi infusion to be held per MD             Nicholas Sanchez tolerated hydration well without complaints or incident. VSS upon discharge. Pt discharged via wheelchair in satisfactory condition

## 2019-03-15 NOTE — Patient Instructions (Addendum)
[place] at [facility] Discharge Instructions  You were seen today by Dr. Delton Coombes. He went over your recent lab results. He will hold your treatment today. He will give you fluids today. He will send in a new prescription for liquid potassium and Diflucan. He will repeat a CT of your chest. He will see you back after scan for labs, treatment and follow up.   Thank you for choosing [place] at [facility] to provide your oncology and hematology care.  To afford each patient quality time with our provider, please arrive at least 15 minutes before your scheduled appointment time.   If you have a lab appointment with the [facility] please come in thru the  Main Entrance and check in at the main information desk  You need to re-schedule your appointment should you arrive 10 or more minutes late.  We strive to give you quality time with our providers, and arriving late affects you and other patients whose appointments are after yours.  Also, if you no show three or more times for appointments you may be dismissed from the clinic at the providers discretion.     Again, thank you for choosing [facility].  Our hope is that these requests will decrease the amount of time that you wait before being seen by our physicians.       _____________________________________________________________  Should you have questions after your visit to [facility], please contact our office at [phone] between the hours of 8:00 a.m. and 4:30 p.m.  Voicemails left after 4:00 p.m. will not be returned until the following business day.  For prescription refill requests, have your pharmacy contact our office and allow 72 hours.    Cancer Center Support Programs:   > Cancer Support Group  2nd Tuesday of the month 1pm-2pm, Journey Room

## 2019-03-16 DIAGNOSIS — I129 Hypertensive chronic kidney disease with stage 1 through stage 4 chronic kidney disease, or unspecified chronic kidney disease: Secondary | ICD-10-CM | POA: Diagnosis not present

## 2019-03-16 DIAGNOSIS — E559 Vitamin D deficiency, unspecified: Secondary | ICD-10-CM | POA: Diagnosis not present

## 2019-03-16 DIAGNOSIS — E876 Hypokalemia: Secondary | ICD-10-CM | POA: Diagnosis not present

## 2019-03-16 DIAGNOSIS — E538 Deficiency of other specified B group vitamins: Secondary | ICD-10-CM | POA: Diagnosis not present

## 2019-03-16 DIAGNOSIS — E7800 Pure hypercholesterolemia, unspecified: Secondary | ICD-10-CM | POA: Diagnosis not present

## 2019-03-16 DIAGNOSIS — K5900 Constipation, unspecified: Secondary | ICD-10-CM | POA: Diagnosis not present

## 2019-03-16 DIAGNOSIS — R1312 Dysphagia, oropharyngeal phase: Secondary | ICD-10-CM | POA: Diagnosis not present

## 2019-03-16 DIAGNOSIS — K2100 Gastro-esophageal reflux disease with esophagitis, without bleeding: Secondary | ICD-10-CM | POA: Diagnosis not present

## 2019-03-16 DIAGNOSIS — I2510 Atherosclerotic heart disease of native coronary artery without angina pectoris: Secondary | ICD-10-CM | POA: Diagnosis not present

## 2019-03-16 DIAGNOSIS — C3491 Malignant neoplasm of unspecified part of right bronchus or lung: Secondary | ICD-10-CM | POA: Diagnosis not present

## 2019-03-16 DIAGNOSIS — I480 Paroxysmal atrial fibrillation: Secondary | ICD-10-CM | POA: Diagnosis not present

## 2019-03-16 DIAGNOSIS — Z9981 Dependence on supplemental oxygen: Secondary | ICD-10-CM | POA: Diagnosis not present

## 2019-03-16 DIAGNOSIS — N179 Acute kidney failure, unspecified: Secondary | ICD-10-CM | POA: Diagnosis not present

## 2019-03-16 DIAGNOSIS — N182 Chronic kidney disease, stage 2 (mild): Secondary | ICD-10-CM | POA: Diagnosis not present

## 2019-03-16 DIAGNOSIS — Y842 Radiological procedure and radiotherapy as the cause of abnormal reaction of the patient, or of later complication, without mention of misadventure at the time of the procedure: Secondary | ICD-10-CM | POA: Diagnosis not present

## 2019-03-16 DIAGNOSIS — R339 Retention of urine, unspecified: Secondary | ICD-10-CM | POA: Diagnosis not present

## 2019-03-16 DIAGNOSIS — H409 Unspecified glaucoma: Secondary | ICD-10-CM | POA: Diagnosis not present

## 2019-03-16 DIAGNOSIS — T66XXXD Radiation sickness, unspecified, subsequent encounter: Secondary | ICD-10-CM | POA: Diagnosis not present

## 2019-03-16 DIAGNOSIS — Z452 Encounter for adjustment and management of vascular access device: Secondary | ICD-10-CM | POA: Diagnosis not present

## 2019-03-20 NOTE — Progress Notes (Signed)
Referring Provider: Sinda Du, MD Primary Care Physician:  Sinda Du, MD Primary GI: Dr. [name]  Chief Complaint  Patient presents with  . Follow-up    wt loss,gerd better    HPI:   Nicholas Sanchez is an 86 y.o. male presenting today with a history of squamous cell carcinoma of left lung, inpatient Oct 2020 with radiation-induced esophagitis. Recently had follow-up with Oncology and prescribed Diflucan empirically. He has lost about 9 lbs in 3 weeks at Oncologist visit on 1/13, weighing 158. By our scales, weighs 148. CT chest scheduled for tomorrow by Oncology. History of multiple polyps, and large polyp in base of cecum removed piecemeal, with 6 month surveillance recommending in Dec 2019. Due to health issues, he was unable to complete this and in interim had been diagnosed with lung cancer. He has wanted to hold off on colonoscopy at prior visits due to other pressing health concerns. This is the first follow-up in office since hospitalization in Oct 2020.   [name], daughter, is present. Patient has good appetite. No dysphagia. Drinking boost three times a day. Enjoys ice cream, potato soup. Eating at least 3 meals per day. No N/V. No diarrhea. Rare constipation. Will use dulcolax suppository or stool softeners. No rectal bleeding. No odynophagia. Finishing out course of Diflucan. Protonix once daily.     Past Medical History:  Diagnosis Date  . Atherosclerotic heart disease of native coronary artery without angina pectoris   . Bradycardia, unspecified   . CAD (coronary artery disease)    a. STEMI 04/2008 s/p BMS to prox & distal RCA, staged DES to LAD same admission. b. Nuc 03/2017: scar but no ischemia, EF 45-54%. c. 09/2017: NSTEMI - DES x 2 to proximal and mid-RCA  . Cancer ([place])   . Chronic anticoagulation   . Chronic atrial fibrillation ([place])   . Chronic systolic (congestive) heart failure ([place])   . CKD (chronic kidney disease), stage II   . Cough,  persistent   . GERD (gastroesophageal reflux disease)   . History of BPH   . HTN (hypertension)   . Hypercholesteremia   . LV dysfunction    a. EF 45-50% in 01/2016.  . Malignant neoplasm of unspecified part of unspecified bronchus or lung ([place])   . Mild pulmonary hypertension ([place])   . Obesity, unspecified   . Other esophagitis without bleeding   . PAF (paroxysmal atrial fibrillation) ([place])   . Pneumonia, unspecified organism   . Port-A-Cath in place 11/21/2018  . Presence of cardiac pacemaker   . Presence of permanent cardiac pacemaker   . Sinus node dysfunction ([place])    a. s/p StJude CRT-pacemaker 01/2016.  [name] Tinnitus, bilateral   . Unspecified glaucoma     Past Surgical History:  Procedure Laterality Date  . BIOPSY  12/15/2018   Procedure: BIOPSY;  Surgeon: [name], MD;  [facility];  Service: Endoscopy;;  esophageal  . CARDIAC CATHETERIZATION    . COLONOSCOPY    . COLONOSCOPY N/A 08/27/2017   Dr. [name]: 25 5-25 mm polyps in descending colon, ascending, cecum. 2X3 cm carpet polyp in base of cecum lifted away s/p piecemeal polypectomy. Tubulovillous adenoma and tubular adenomas  . CORONARY STENT INTERVENTION N/A 10/22/2017   Procedure: CORONARY STENT INTERVENTION;  Surgeon: [name], MD;  Location: [facility];  Service: Cardiovascular;  Laterality: N/A;  . CORONARY STENT PLACEMENT  04/2008   RCA and LAD  . EP IMPLANTABLE DEVICE N/A  01/09/2016   Procedure: BiV Pacemaker Insertion CRT-P;  Surgeon: [name], MD;  Location: [facility];  Service: Cardiovascular;  Laterality: N/A;  . ESOPHAGOGASTRODUODENOSCOPY N/A 08/27/2017   normal esophagus s/p dilation, gastric petechia, small hiatal hernia, normal duodenum  . ESOPHAGOGASTRODUODENOSCOPY (EGD) WITH PROPOFOL N/A 12/15/2018   severe esophagitis likely radiation-induced, medium-sized hiatal hernia, normal duodenum  . INSERT / REPLACE / REMOVE PACEMAKER    . MALONEY DILATION N/A  08/27/2017   Procedure: [name] DILATION;  Surgeon: [name], MD;  [facility];  Service: Endoscopy;  Laterality: N/A;  . PORTACATH PLACEMENT Right 11/18/2018   Procedure: INSERTION PORT-A-CATH;  Surgeon: [name], MD;  Location: [facility];  Service: General;  Laterality: Right;  . RIGHT/LEFT HEART CATH AND CORONARY ANGIOGRAPHY N/A 10/21/2017   Procedure: RIGHT/LEFT HEART CATH AND CORONARY ANGIOGRAPHY;  Surgeon: [name], MD;  Location: [facility];  Service: Cardiovascular;  Laterality: N/A;    Current Outpatient Medications  Medication Sig Dispense Refill  . acetaminophen (TYLENOL) 500 MG tablet Take 500 mg by mouth every 6 (six) hours as needed for pain.    [name] aspirin EC 81 MG tablet Take 81 mg by mouth daily.    . cholecalciferol (VITAMIN D) 1000 units tablet Take 1,000 Units by mouth 2 (two) times daily.    . dorzolamide-timolol (COSOPT) 22.3-6.8 MG/ML ophthalmic solution Place 1 drop into the left eye 2 (two) times daily.    . fluconazole (DIFLUCAN) 100 MG tablet Take 1 tablet (100 mg total) by mouth daily. 7 tablet 0  . hydrochlorothiazide (HYDRODIURIL) 25 MG tablet Take 25 mg by mouth daily.    . metoprolol succinate (TOPROL-XL) 50 MG 24 hr tablet Take 1 tablet (50 mg total) by mouth daily. Take with or immediately following a meal.    . nitroGLYCERIN (NITROSTAT) 0.4 MG SL tablet Place 0.4 mg under the tongue every 5 (five) minutes as needed for chest pain.    . pantoprazole (PROTONIX) 40 MG tablet Take 1 tablet (40 mg total) by mouth daily before breakfast. 90 tablet 1  . potassium chloride 20 MEQ/15ML (10%) SOLN Take 15 mLs (20 mEq total) by mouth daily. 473 mL 0  . potassium chloride SA (KLOR-CON) 20 MEQ tablet Take 2 tablets (40 mEq total) by mouth 2 (two) times daily. For 2 days    . sucralfate (CARAFATE) 1 GM/10ML suspension Take 10 mLs (1 g total) by mouth 4 (four) times daily -  with meals and at bedtime. (Patient taking differently: Take 1 g by  mouth 2 (two) times daily. ) 420 mL 0  . tamsulosin (FLOMAX) 0.4 MG CAPS capsule Take 0.4 mg by mouth daily.    . vitamin B-12 (CYANOCOBALAMIN) 1000 MCG tablet Take 1,000 mcg by mouth daily.     No current facility-administered medications for this visit.    Allergies as of 03/21/2019 - Review Complete 03/21/2019  Allergen Reaction Noted  . Ace inhibitors Nausea Only 07/18/2010  . Tape Rash 01/08/2016    Family History  Problem Relation Age of Onset  . Emphysema Mother        mother died with emphysema and cancer  . Cancer Mother   . Prostate cancer Brother   . Heart disease Brother   . Hypothyroidism Daughter   . Hypothyroidism Daughter   . Colon cancer Neg Hx   . Colon polyps Neg Hx     Social History   Socioeconomic History  . Marital status:  Married    Spouse name: Air traffic controller  . Number of children: 3  . Years of education: Not on file  . Highest education level: Not on file  Occupational History  . Occupation: retired    Comment: Press photographer  Tobacco Use  . Smoking status: Former Smoker    Packs/day: 2.50    Years: 25.00    Pack years: 62.50    Types: Cigarettes    Quit date: 06/14/1973    Years since quitting: 45.7  . Smokeless tobacco: Former Systems developer    Types: Chew    Quit date: 06/14/1973  . Tobacco comment: some/chewed very little for about 2 years  Substance and Sexual Activity  . Alcohol use: No  . Drug use: No  . Sexual activity: Not on file  Other Topics Concern  . 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    Social Determinants of Health   Financial Resource Strain: Low Risk   . Difficulty of Paying Living Expenses: Not very hard  Food Insecurity: No Food Insecurity  . Worried About Charity fundraiser in the Last Year: Never true  . Ran Out of Food in the Last Year: Never true  Transportation Needs: No Transportation Needs  . Lack of Transportation (Medical): No  . Lack of Transportation (Non-Medical): No    Physical Activity: Inactive  . Days of Exercise per Week: 0 days  . Minutes of Exercise per Session: 0 min  Stress: No Stress Concern Present  . Feeling of Stress : Not at all  Social Connections: Slightly Isolated  . Frequency of Communication with Friends and Family: More than three times a week  . Frequency of Social Gatherings with Friends and Family: More than three times a week  . Attends Religious Services: More than 4 times per year  . Active Member of Clubs or Organizations: No  . Attends Archivist Meetings: Never  . Marital Status: Married    Review of Systems: Gen: see HPI CV: Denies chest pain, palpitations, syncope, peripheral edema, and claudication. Resp: Denies dyspnea at rest, cough, wheezing, coughing up blood, and pleurisy. GI: see HPI Derm: Denies rash, itching, dry skin Psych: Denies depression, anxiety, memory loss, confusion. No homicidal or suicidal ideation.  Heme: Denies bruising, bleeding, and enlarged lymph nodes.  Physical Exam: BP 113/62   Pulse 70   Temp (!) 97.1 F (36.2 C)   Ht 6\' 1"  (1.854 m)   Wt 148 lb (67.1 kg)   BMI 19.53 kg/m  General:   Alert and oriented. No distress noted. Thin, chronically ill-appearing Head:  Normocephalic and atraumatic. Eyes:  Conjuctiva clear without scleral icterus. Abdomen:  +BS, soft, non-tender and non-distended. Limited exam with patient sitting in wheelchair Msk:  With kyphosis  Extremities:  Without edema. Neurologic:  Alert and  oriented x4 Psych:  Alert and cooperative. Normal mood and affect.  ASSESSMENT: Nicholas Sanchez is an 86 y.o. male presenting today with a history of squamous cell carcinoma of left lung, inpatient Oct 2020 with radiation-induced esophagitis, presenting today in follow-up. He is tolerating his diet without dysphagia or odynophagia. Concerning weight loss continues, with an additional 10 lbs in the past week. He does have a history of multiple polyps and large polyp  removed piecemeal in June 2019; however, he has not had surveillance due to multiple health issues and diagnosis of lung cancer. At this time, he would not be able to tolerate prep  for colonoscopy, nor would it likely change the overall prognosis and trajectory.   I will reach out to Dr. Delton Coombes regarding persistent weight loss. Other than CT abd/pelvis, limited options from a GI standpoint. Encouragingly, he is tolerating his diet and remains on PPI therapy.    PLAN:   Continue with close Oncology follow-up  Continue PPI once daily. May increase to BID in future if needed.   Call if further dysphagia  Return in 4 months if health permits  Annitta Needs, PhD, ANP-BC [facility] Gastroenterology

## 2019-03-21 ENCOUNTER — Encounter: Payer: Self-pay | Admitting: Gastroenterology

## 2019-03-21 ENCOUNTER — Other Ambulatory Visit: Payer: Self-pay

## 2019-03-21 ENCOUNTER — Ambulatory Visit (INDEPENDENT_AMBULATORY_CARE_PROVIDER_SITE_OTHER): Payer: No Typology Code available for payment source | Admitting: Gastroenterology

## 2019-03-21 VITALS — BP 113/62 | HR 70 | Temp 97.1°F | Ht 73.0 in | Wt 148.0 lb

## 2019-03-21 DIAGNOSIS — T66XXXA Radiation sickness, unspecified, initial encounter: Secondary | ICD-10-CM | POA: Diagnosis not present

## 2019-03-21 DIAGNOSIS — R1312 Dysphagia, oropharyngeal phase: Secondary | ICD-10-CM | POA: Diagnosis not present

## 2019-03-21 DIAGNOSIS — K2100 Gastro-esophageal reflux disease with esophagitis, without bleeding: Secondary | ICD-10-CM | POA: Diagnosis not present

## 2019-03-21 DIAGNOSIS — K208 Other esophagitis without bleeding: Secondary | ICD-10-CM

## 2019-03-21 DIAGNOSIS — R339 Retention of urine, unspecified: Secondary | ICD-10-CM | POA: Diagnosis not present

## 2019-03-21 DIAGNOSIS — N179 Acute kidney failure, unspecified: Secondary | ICD-10-CM | POA: Diagnosis not present

## 2019-03-21 DIAGNOSIS — I2510 Atherosclerotic heart disease of native coronary artery without angina pectoris: Secondary | ICD-10-CM | POA: Diagnosis not present

## 2019-03-21 DIAGNOSIS — R634 Abnormal weight loss: Secondary | ICD-10-CM | POA: Diagnosis not present

## 2019-03-21 DIAGNOSIS — H409 Unspecified glaucoma: Secondary | ICD-10-CM | POA: Diagnosis not present

## 2019-03-21 DIAGNOSIS — I129 Hypertensive chronic kidney disease with stage 1 through stage 4 chronic kidney disease, or unspecified chronic kidney disease: Secondary | ICD-10-CM | POA: Diagnosis not present

## 2019-03-21 DIAGNOSIS — Z86010 Personal history of colonic polyps: Secondary | ICD-10-CM | POA: Diagnosis not present

## 2019-03-21 DIAGNOSIS — T66XXXD Radiation sickness, unspecified, subsequent encounter: Secondary | ICD-10-CM | POA: Diagnosis not present

## 2019-03-21 DIAGNOSIS — E559 Vitamin D deficiency, unspecified: Secondary | ICD-10-CM | POA: Diagnosis not present

## 2019-03-21 DIAGNOSIS — N182 Chronic kidney disease, stage 2 (mild): Secondary | ICD-10-CM | POA: Diagnosis not present

## 2019-03-21 DIAGNOSIS — Z9981 Dependence on supplemental oxygen: Secondary | ICD-10-CM | POA: Diagnosis not present

## 2019-03-21 DIAGNOSIS — E876 Hypokalemia: Secondary | ICD-10-CM | POA: Diagnosis not present

## 2019-03-21 DIAGNOSIS — E538 Deficiency of other specified B group vitamins: Secondary | ICD-10-CM | POA: Diagnosis not present

## 2019-03-21 DIAGNOSIS — C3491 Malignant neoplasm of unspecified part of right bronchus or lung: Secondary | ICD-10-CM | POA: Diagnosis not present

## 2019-03-21 DIAGNOSIS — K5900 Constipation, unspecified: Secondary | ICD-10-CM | POA: Diagnosis not present

## 2019-03-21 DIAGNOSIS — Z452 Encounter for adjustment and management of vascular access device: Secondary | ICD-10-CM | POA: Diagnosis not present

## 2019-03-21 DIAGNOSIS — Y842 Radiological procedure and radiotherapy as the cause of abnormal reaction of the patient, or of later complication, without mention of misadventure at the time of the procedure: Secondary | ICD-10-CM | POA: Diagnosis not present

## 2019-03-21 DIAGNOSIS — I480 Paroxysmal atrial fibrillation: Secondary | ICD-10-CM | POA: Diagnosis not present

## 2019-03-21 DIAGNOSIS — E7800 Pure hypercholesterolemia, unspecified: Secondary | ICD-10-CM | POA: Diagnosis not present

## 2019-03-21 MED ORDER — PANTOPRAZOLE SODIUM 40 MG PO TBEC: 40.0000 mg | DELAYED_RELEASE_TABLET | Freq: Every day | ORAL | 1 refills | Status: AC

## 2019-03-21 NOTE — Progress Notes (Signed)
Cc'ed to pcp °

## 2019-03-21 NOTE — Patient Instructions (Signed)
Continue Protonix once each morning, 30 minutes before breakfast.   I will let Dr. Raliegh Ip know you were seen here today.  Please call if any concerns!  We can see you in 4 months!  Thank you so much for the book! I will always treasure it!  I enjoyed seeing you again today! As you know, I value our relationship and want to provide genuine, compassionate, and quality care. I welcome your feedback. If you receive a survey regarding your visit,  I greatly appreciate you taking time to fill this out. See you next time!  Annitta Needs, PhD, ANP-BC [facility] Gastroenterology

## 2019-03-22 ENCOUNTER — Ambulatory Visit (HOSPITAL_COMMUNITY)
Admission: RE | Admit: 2019-03-22 | Discharge: 2019-03-22 | Disposition: A | Discharge status: Home / Self Care | Payer: Medicare Other | Source: Ambulatory Visit | Attending: Hematology | Admitting: Hematology

## 2019-03-22 DIAGNOSIS — C3490 Malignant neoplasm of unspecified part of unspecified bronchus or lung: Secondary | ICD-10-CM | POA: Diagnosis not present

## 2019-03-22 DIAGNOSIS — C3492 Malignant neoplasm of unspecified part of left bronchus or lung: Secondary | ICD-10-CM | POA: Diagnosis not present

## 2019-03-22 MED ORDER — IOHEXOL 300 MG/ML  SOLN
75.0000 mL | Freq: Once | INTRAMUSCULAR | Status: AC | PRN
  Administered 2019-03-22: 75 mL via INTRAVENOUS

## 2019-03-23 ENCOUNTER — Encounter (HOSPITAL_COMMUNITY): Payer: Self-pay | Admitting: Hematology

## 2019-03-23 ENCOUNTER — Inpatient Hospital Stay (HOSPITAL_COMMUNITY): Payer: Medicare Other

## 2019-03-23 ENCOUNTER — Inpatient Hospital Stay (HOSPITAL_BASED_OUTPATIENT_CLINIC_OR_DEPARTMENT_OTHER): Payer: Medicare Other | Admitting: Hematology

## 2019-03-23 ENCOUNTER — Other Ambulatory Visit: Payer: Self-pay

## 2019-03-23 DIAGNOSIS — I252 Old myocardial infarction: Secondary | ICD-10-CM | POA: Diagnosis not present

## 2019-03-23 DIAGNOSIS — C3432 Malignant neoplasm of lower lobe, left bronchus or lung: Secondary | ICD-10-CM | POA: Diagnosis not present

## 2019-03-23 DIAGNOSIS — E876 Hypokalemia: Secondary | ICD-10-CM | POA: Diagnosis not present

## 2019-03-23 DIAGNOSIS — Z79899 Other long term (current) drug therapy: Secondary | ICD-10-CM | POA: Diagnosis not present

## 2019-03-23 DIAGNOSIS — C3490 Malignant neoplasm of unspecified part of unspecified bronchus or lung: Secondary | ICD-10-CM

## 2019-03-23 DIAGNOSIS — Z7902 Long term (current) use of antithrombotics/antiplatelets: Secondary | ICD-10-CM | POA: Diagnosis not present

## 2019-03-23 DIAGNOSIS — Z87891 Personal history of nicotine dependence: Secondary | ICD-10-CM | POA: Diagnosis not present

## 2019-03-23 DIAGNOSIS — Z950 Presence of cardiac pacemaker: Secondary | ICD-10-CM | POA: Diagnosis not present

## 2019-03-23 DIAGNOSIS — I2510 Atherosclerotic heart disease of native coronary artery without angina pectoris: Secondary | ICD-10-CM | POA: Diagnosis not present

## 2019-03-23 LAB — CBC WITH DIFFERENTIAL/PLATELET
Abs Immature Granulocytes: 0.01 10*3/uL (ref 0.00–0.07)
Basophils Absolute: 0 10*3/uL (ref 0.0–0.1)
Basophils Relative: 0 %
Eosinophils Absolute: 0.1 10*3/uL (ref 0.0–0.5)
Eosinophils Relative: 2 %
HCT: 32.9 % — ABNORMAL LOW (ref 39.0–52.0)
Hemoglobin: 10.2 g/dL — ABNORMAL LOW (ref 13.0–17.0)
Immature Granulocytes: 0 %
Lymphocytes Relative: 5 %
Lymphs Abs: 0.3 10*3/uL — ABNORMAL LOW (ref 0.7–4.0)
MCH: 33.1 pg (ref 26.0–34.0)
MCHC: 31 g/dL (ref 30.0–36.0)
MCV: 106.8 fL — ABNORMAL HIGH (ref 80.0–100.0)
Monocytes Absolute: 0.4 10*3/uL (ref 0.1–1.0)
Monocytes Relative: 8 %
Neutro Abs: 4.7 10*3/uL (ref 1.7–7.7)
Neutrophils Relative %: 85 %
Platelets: 176 10*3/uL (ref 150–400)
RBC: 3.08 MIL/uL — ABNORMAL LOW (ref 4.22–5.81)
RDW: 14.5 % (ref 11.5–15.5)
WBC: 5.5 10*3/uL (ref 4.0–10.5)
nRBC: 0 % (ref 0.0–0.2)

## 2019-03-23 LAB — COMPREHENSIVE METABOLIC PANEL
ALT: 11 U/L (ref 0–44)
AST: 15 U/L (ref 15–41)
Albumin: 3.6 g/dL (ref 3.5–5.0)
Alkaline Phosphatase: 55 U/L (ref 38–126)
Anion gap: 8 (ref 5–15)
BUN: 15 mg/dL (ref 8–23)
CO2: 27 mmol/L (ref 22–32)
Calcium: 9.2 mg/dL (ref 8.9–10.3)
Chloride: 99 mmol/L (ref 98–111)
Creatinine, Ser: 0.88 mg/dL (ref 0.61–1.24)
GFR calc Af Amer: >60 mL/min (ref >60)
GFR calc non Af Amer: >60 mL/min (ref >60)
Glucose, Bld: 139 mg/dL — ABNORMAL HIGH (ref 70–99)
Potassium: 3.8 mmol/L (ref 3.5–5.1)
Sodium: 134 mmol/L — ABNORMAL LOW (ref 135–145)
Total Bilirubin: 0.8 mg/dL (ref 0.3–1.2)
Total Protein: 7.1 g/dL (ref 6.5–8.1)

## 2019-03-23 MED ORDER — FLUCONAZOLE 100 MG PO TABS: 100.0000 mg | ORAL_TABLET | Freq: Every day | ORAL | 0 refills | Status: DC

## 2019-03-23 MED ORDER — HEPARIN SOD (PORK) LOCK FLUSH 100 UNIT/ML IV SOLN
500.0000 [IU] | Freq: Once | INTRAVENOUS | Status: AC
  Administered 2019-03-23: 10:00:00 500 [IU] via INTRAVENOUS

## 2019-03-23 MED ORDER — PREDNISONE 20 MG PO TABS: ORAL_TABLET | ORAL | 0 refills | Status: DC

## 2019-03-23 MED ORDER — SODIUM CHLORIDE 0.9% FLUSH
10.0000 mL | Freq: Once | INTRAVENOUS | Status: AC
  Administered 2019-03-23: 10:00:00 10 mL

## 2019-03-23 NOTE — Patient Instructions (Addendum)
[place] at [facility] Discharge Instructions  You were seen today by Dr. Delton Coombes. He went over your recent lab and scan results. He will send a prescription to your pharmacy for Prednisone as well diflucan. He will hold your treatment today. He will see you back in 1 week for labs, treatment and follow up.   Thank you for choosing [place] at [facility] to provide your oncology and hematology care.  To afford each patient quality time with our provider, please arrive at least 15 minutes before your scheduled appointment time.   If you have a lab appointment with the [facility] please come in thru the  Main Entrance and check in at the main information desk  You need to re-schedule your appointment should you arrive 10 or more minutes late.  We strive to give you quality time with our providers, and arriving late affects you and other patients whose appointments are after yours.  Also, if you no show three or more times for appointments you may be dismissed from the clinic at the providers discretion.     Again, thank you for choosing [facility] - .  Our hope is that these requests will decrease the amount of time that you wait before being seen by our physicians.       _____________________________________________________________  Should you have questions after your visit to [facility], please contact our office at [phone] between the hours of 8:00 a.m. and 4:30 p.m.  Voicemails left after 4:00 p.m. will not be returned until the following business day.  For prescription refill requests, have your pharmacy contact our office and allow 72 hours.    Cancer Center Support Programs:   > Cancer Support Group  2nd Tuesday of the month 1pm-2pm, Journey Room

## 2019-03-23 NOTE — Progress Notes (Signed)
Nicholas Sanchez, [place] [id number]   CLINIC:  Medical Oncology/Hematology  PCP:  Sinda Du, MD No address on file None   REASON FOR VISIT:  Follow-up for lung cancer.  CURRENT THERAPY: Consolidation immunotherapy.   CANCER STAGING: Cancer Staging Squamous cell carcinoma of lung (HCC) Staging form: Lung, AJCC 8th Edition - Clinical stage from 11/09/2018: Stage IIIB (cT1c, cN3, cM0) - Unsigned    INTERVAL HISTORY:  [name] 86 y.o. male seen for follow-up of lung cancer.  He gained about 3 pounds.  He is drinking 2 to 3 cans of boost per day.  He is eating better.  Reports some cough with clear sputum.  Still continues to have shortness of breath on minimal exertion.  Appetite and energy levels are 50%.  Chronic low back pain is rated as 5 out of 10.  Mild degree of constipation is stable.    REVIEW OF SYSTEMS:  Review of Systems  Constitutional: Positive for fatigue.  Respiratory: Positive for cough and shortness of breath.   Gastrointestinal: Positive for constipation.  All other systems reviewed and are negative.    PAST MEDICAL/SURGICAL HISTORY:  Past Medical History:  Diagnosis Date  . Atherosclerotic heart disease of native coronary artery without angina pectoris   . Bradycardia, unspecified   . CAD (coronary artery disease)    a. STEMI 04/2008 s/p BMS to prox & distal RCA, staged DES to LAD same admission. b. Nuc 03/2017: scar but no ischemia, EF 45-54%. c. 09/2017: NSTEMI - DES x 2 to proximal and mid-RCA  . Cancer ([place])   . Chronic anticoagulation   . Chronic atrial fibrillation ([place])   . Chronic systolic (congestive) heart failure ([place])   . CKD (chronic kidney disease), stage II   . Cough, persistent   . GERD (gastroesophageal reflux disease)   . History of BPH   . HTN (hypertension)   . Hypercholesteremia   . LV dysfunction    a. EF 45-50% in 01/2016.  . Malignant neoplasm of unspecified part of unspecified  bronchus or lung ([place])   . Mild pulmonary hypertension ([place])   . Obesity, unspecified   . Other esophagitis without bleeding   . PAF (paroxysmal atrial fibrillation) ([place])   . Pneumonia, unspecified organism   . Port-A-Cath in place 11/21/2018  . Presence of cardiac pacemaker   . Presence of permanent cardiac pacemaker   . Sinus node dysfunction ([place])    a. s/p StJude CRT-pacemaker 01/2016.  [name] Tinnitus, bilateral   . Unspecified glaucoma    Past Surgical History:  Procedure Laterality Date  . BIOPSY  12/15/2018   Procedure: BIOPSY;  Surgeon: [name], MD;  [facility];  Service: Endoscopy;;  esophageal  . CARDIAC CATHETERIZATION    . COLONOSCOPY    . COLONOSCOPY N/A 08/27/2017   Dr. [name]: 25 5-25 mm polyps in descending colon, ascending, cecum. 2X3 cm carpet polyp in base of cecum lifted away s/p piecemeal polypectomy. Tubulovillous adenoma and tubular adenomas  . CORONARY STENT INTERVENTION N/A 10/22/2017   Procedure: CORONARY STENT INTERVENTION;  Surgeon: [name], MD;  Location: [facility];  Service: Cardiovascular;  Laterality: N/A;  . CORONARY STENT PLACEMENT  04/2008   RCA and LAD  . EP IMPLANTABLE DEVICE N/A 01/09/2016   Procedure: BiV Pacemaker Insertion CRT-P;  Surgeon: [name], MD;  Location: [facility];  Service: Cardiovascular;  Laterality: N/A;  . ESOPHAGOGASTRODUODENOSCOPY N/A 08/27/2017  normal esophagus s/p dilation, gastric petechia, small hiatal hernia, normal duodenum  . ESOPHAGOGASTRODUODENOSCOPY (EGD) WITH PROPOFOL N/A 12/15/2018   severe esophagitis likely radiation-induced, medium-sized hiatal hernia, normal duodenum  . INSERT / REPLACE / REMOVE PACEMAKER    . MALONEY DILATION N/A 08/27/2017   Procedure: [name] DILATION;  Surgeon: [name], MD;  [facility];  Service: Endoscopy;  Laterality: N/A;  . PORTACATH PLACEMENT Right 11/18/2018   Procedure: INSERTION PORT-A-CATH;  Surgeon: [name],  MD;  Location: [facility];  Service: General;  Laterality: Right;  . RIGHT/LEFT HEART CATH AND CORONARY ANGIOGRAPHY N/A 10/21/2017   Procedure: RIGHT/LEFT HEART CATH AND CORONARY ANGIOGRAPHY;  Surgeon: [name], MD;  Location: [facility];  Service: Cardiovascular;  Laterality: N/A;     SOCIAL HISTORY:  Social History   Socioeconomic History  . Marital status: Married    Spouse name: Air traffic controller  . Number of children: 3  . Years of education: Not on file  . Highest education level: Not on file  Occupational History  . Occupation: retired    Comment: Press photographer  Tobacco Use  . Smoking status: Former Smoker    Packs/day: 2.50    Years: 25.00    Pack years: 62.50    Types: Cigarettes    Quit date: 06/14/1973    Years since quitting: 45.8  . Smokeless tobacco: Former Systems developer    Types: Chew    Quit date: 06/14/1973  . Tobacco comment: some/chewed very little for about 2 years  Substance and Sexual Activity  . Alcohol use: No  . Drug use: No  . Sexual activity: Not on file  Other Topics Concern  . 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    Social Determinants of Health   Financial Resource Strain: Low Risk   . Difficulty of Paying Living Expenses: Not very hard  Food Insecurity: No Food Insecurity  . Worried About Charity fundraiser in the Last Year: Never true  . Ran Out of Food in the Last Year: Never true  Transportation Needs: No Transportation Needs  . Lack of Transportation (Medical): No  . Lack of Transportation (Non-Medical): No  Physical Activity: Inactive  . Days of Exercise per Week: 0 days  . Minutes of Exercise per Session: 0 min  Stress: No Stress Concern Present  . Feeling of Stress : Not at all  Social Connections: Slightly Isolated  . Frequency of Communication with Friends and Family: More than three times a week  . Frequency of Social Gatherings with Friends and Family: More than three times a week  . Attends  Religious Services: More than 4 times per year  . Active Member of Clubs or Organizations: No  . Attends Archivist Meetings: Never  . Marital Status: Married  Human resources officer Violence: Not At Risk  . Fear of Current or Ex-Partner: No  . Emotionally Abused: No  . Physically Abused: No  . Sexually Abused: No    FAMILY HISTORY:  Family History  Problem Relation Age of Onset  . Emphysema Mother        mother died with emphysema and cancer  . Cancer Mother   . Prostate cancer Brother   . Heart disease Brother   . Hypothyroidism Daughter   . Hypothyroidism Daughter   . Colon cancer Neg Hx   . Colon polyps Neg Hx     CURRENT MEDICATIONS:  Outpatient Encounter Medications  as of 03/23/2019  Medication Sig  . acetaminophen (TYLENOL) 500 MG tablet Take 500 mg by mouth every 6 (six) hours as needed for pain.  [name] aspirin EC 81 MG tablet Take 81 mg by mouth daily.  . cholecalciferol (VITAMIN D) 1000 units tablet Take 1,000 Units by mouth 2 (two) times daily.  . dorzolamide-timolol (COSOPT) 22.3-6.8 MG/ML ophthalmic solution Place 1 drop into the left eye 2 (two) times daily.  . fluconazole (DIFLUCAN) 100 MG tablet Take 1 tablet (100 mg total) by mouth daily.  . hydrochlorothiazide (HYDRODIURIL) 25 MG tablet Take 25 mg by mouth daily.  . metoprolol succinate (TOPROL-XL) 50 MG 24 hr tablet Take 1 tablet (50 mg total) by mouth daily. Take with or immediately following a meal.  . nitroGLYCERIN (NITROSTAT) 0.4 MG SL tablet Place 0.4 mg under the tongue every 5 (five) minutes as needed for chest pain.  . pantoprazole (PROTONIX) 40 MG tablet Take 1 tablet (40 mg total) by mouth daily before breakfast.  . potassium chloride 20 MEQ/15ML (10%) SOLN Take 15 mLs (20 mEq total) by mouth daily.  . potassium chloride SA (KLOR-CON) 20 MEQ tablet Take 2 tablets (40 mEq total) by mouth 2 (two) times daily. For 2 days  . predniSONE (DELTASONE) 20 MG tablet Take 3 tablets (60mg ) a day for 5 days  .  sucralfate (CARAFATE) 1 GM/10ML suspension Take 10 mLs (1 g total) by mouth 4 (four) times daily -  with meals and at bedtime. (Patient taking differently: Take 1 g by mouth 2 (two) times daily. )  . tamsulosin (FLOMAX) 0.4 MG CAPS capsule Take 0.4 mg by mouth daily.  . vitamin B-12 (CYANOCOBALAMIN) 1000 MCG tablet Take 1,000 mcg by mouth daily.  . [DISCONTINUED] fluconazole (DIFLUCAN) 100 MG tablet Take 1 tablet (100 mg total) by mouth daily.   No facility-administered encounter medications on file as of 03/23/2019.    ALLERGIES:  Allergies  Allergen Reactions  . Ace Inhibitors Nausea Only  . Tape Rash    And Bandaids, too     PHYSICAL EXAM:  ECOG Performance status: 1  Vitals:   03/23/19 0820  BP: (!) 124/54  Pulse: 70  Resp: 18  Temp: (!) 97 F (36.1 C)  SpO2: 98%   Filed Weights   03/23/19 0820  Weight: 161 lb 3.2 oz (73.1 kg)    Physical Exam Vitals reviewed.  Constitutional:      Appearance: Normal appearance.  Cardiovascular:     Rate and Rhythm: Normal rate and regular rhythm.     Heart sounds: Normal heart sounds.  Pulmonary:     Effort: Pulmonary effort is normal.     Breath sounds: Normal breath sounds.  Abdominal:     General: There is no distension.     Palpations: Abdomen is soft. There is no mass.  Musculoskeletal:        General: No swelling.  Skin:    General: Skin is warm.  Neurological:     General: No focal deficit present.     Mental Status: He is alert and oriented to person, place, and time.  Psychiatric:        Mood and Affect: Mood normal.        Behavior: Behavior normal.      LABORATORY DATA:  I have reviewed the labs as listed.  CBC    Component Value Date/Time   WBC 5.5 03/23/2019 0815   RBC 3.08 (L) 03/23/2019 0815   HGB 10.2 (L) 03/23/2019 0815  HCT 32.9 (L) 03/23/2019 0815   PLT 176 03/23/2019 0815   MCV 106.8 (H) 03/23/2019 0815   MCH 33.1 03/23/2019 0815   MCHC 31.0 03/23/2019 0815   RDW 14.5 03/23/2019 0815    LYMPHSABS 0.3 (L) 03/23/2019 0815   MONOABS 0.4 03/23/2019 0815   EOSABS 0.1 03/23/2019 0815   BASOSABS 0.0 03/23/2019 0815   CMP Latest Ref Rng & Units 03/23/2019 03/15/2019 03/08/2019  Glucose 70 - 99 mg/dL 139(H) 144(H) 118(H)  BUN 8 - 23 mg/dL 15 17 19   Creatinine 0.61 - 1.24 mg/dL 0.88 0.84 0.91  Sodium 135 - 145 mmol/L 134(L) 134(L) 136  Potassium 3.5 - 5.1 mmol/L 3.8 3.4(L) 3.1(L)  Chloride 98 - 111 mmol/L 99 96(L) 97(L)  CO2 22 - 32 mmol/L 27 28 27   Calcium 8.9 - 10.3 mg/dL 9.2 9.7 9.5  Total Protein 6.5 - 8.1 g/dL 7.1 7.4 7.3  Total Bilirubin 0.3 - 1.2 mg/dL 0.8 1.2 1.4(H)  Alkaline Phos 38 - 126 U/L 55 57 57  AST 15 - 41 U/L 15 15 16   ALT 0 - 44 U/L 11 11 11        DIAGNOSTIC IMAGING:  I have independently reviewed the scans and discussed with the patient.    ASSESSMENT & PLAN:   Squamous cell carcinoma of lung ([place]) 1.  [name] squamous cell carcinoma left lung: -PET scan on 10/17/2018 showed hypermetabolic left lower lobe nodule, left hilar and contralateral right lower paratracheal lymph node along with right hilar lymph nodes.  Hypermetabolic subcarinal lymph nodes present.  Hypermetabolic lymph node in the porta hepatis with SUV 4.9, measuring 1.1 cm, favoring reactive adenopathy. -26 pound weight loss in the last 1 year, ex-smoker quit in 1975. -Left lower lobe lung biopsy on 10/31/2018 consistent with squamous cell carcinoma. - Weekly carbotaxol started on 11/21/2018 and radiation on 11/22/2018. -He received 3 weekly doses of carboplatin and paclitaxel, last dose on 12/05/2018.  Last XRT was on 12/09/2018. -He had prolonged hospitalization from 12/12/2018 through 12/26/2018 with severe radiation esophagitis and weakness.  He was subsequently discharged to a rehab facility.  He is currently been home since last 1 week.  He could not tolerate any further chemo or radiation. -CT chest with contrast on 01/25/2019 showed posterior left lower lobe cavitary nodule measuring 1.7  x 2.7 cm, previously 2.8 x 3.0 cm.  Improvement in size of the nodule meta stasis.  No new lesions were seen. -Durvalumab consolidation every 2 weeks started on 02/07/2019. -We reviewed results of the CT chest with contrast from 03/22/2019.  There is increased consolidation in the left lower lobe at the treatment site.  There is no clear progression of the tumor.  Increased consolidation was thought to be radiation effect. -I think the increased consolidation is highly suspicious for pneumonitis.  He does report some cough with clear sputum. -I will hold his durvalumab.  I will give a trial of prednisone 60 mg daily for 5 days.  I will also give empiric Diflucan 100 mg daily for 7 days. -I will reevaluate him in 1 week.  2.  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  Pacemaker was placed in November 2018. -Plavix on hold since hemoptysis.  He will continue aspirin daily.  3.  Hypokalemia: -He will continue K-Lor 20 mEq daily.  His potassium is normal today.  4.  Weight loss: -He initially lost 9 pounds.  He gained about 3 pounds back. -He is drinking about 2 to 3 cans  of boost per day.  He reports eating better.  Diflucan helped with swallowing.     Orders placed this encounter:  No orders of the defined types were placed in this encounter. Total time spent is 30 minutes with more than 50% of the time spent face-to-face setting new findings on imaging, further management, counseling and coordination of care.    Derek Jack, MD [place] [phone]

## 2019-03-23 NOTE — Patient Instructions (Signed)
[facility] at [facility] Discharge Instructions  Labs drawn from portacath today   Thank you for choosing [facility] at [facility] to provide your oncology and hematology care.  To afford each patient quality time with our provider, please arrive at least 15 minutes before your scheduled appointment time.   If you have a lab appointment with the Cancer Center please come in thru the Main Entrance and check in at the main information desk.  You need to re-schedule your appointment should you arrive 10 or more minutes late.  We strive to give you quality time with our providers, and arriving late affects you and other patients whose appointments are after yours.  Also, if you no show three or more times for appointments you may be dismissed from the clinic at the providers discretion.     Again, thank you for choosing [facility].  Our hope is that these requests will decrease the amount of time that you wait before being seen by our physicians.       _____________________________________________________________  Should you have questions after your visit to  Cancer Center, please contact our office at [phone] between the hours of 8:00 a.m. and 4:30 p.m.  Voicemails left after 4:00 p.m. will not be returned until the following business day.  For prescription refill requests, have your pharmacy contact our office and allow 72 hours.    Due to Covid, you will need to wear a mask upon entering the hospital. If you do not have a mask, a mask will be given to you at the Main Entrance upon arrival. For doctor visits, patients may have 1 support person with them. For treatment visits, patients can not have anyone with them due to social distancing guidelines and our immunocompromised population.     

## 2019-03-23 NOTE — Progress Notes (Signed)
HOLD Tx today per Dr. Delton Coombes, meds sent in to pharmacy. He will return next week for labs and Tx.

## 2019-03-23 NOTE — Progress Notes (Signed)
Patient presents today for treatment and follow up visit with Dr. Delton Coombes. Vital signs within parameters for treatment. Labs pending. Patient has no complaints of any significant changes since the last visit. Patient denies any diarrhea and states appetite is much better and oral intake improved.   Message received today from [facility] LPN/ Dr. Delton Coombes. HOLD treatment today. Patient to return next week for labs and treatment. Diflucan 1 tab per day for 7 days and Prednisone 60mg  a day PO for 5 days sent to pharmacy per [facility] LPN. Treatment held today due to Pneumonitis per [facility] LPN/ Dr. Delton Coombes.

## 2019-03-24 ENCOUNTER — Inpatient Hospital Stay (HOSPITAL_COMMUNITY): Payer: Medicare Other

## 2019-03-24 DIAGNOSIS — E559 Vitamin D deficiency, unspecified: Secondary | ICD-10-CM | POA: Diagnosis not present

## 2019-03-24 DIAGNOSIS — H409 Unspecified glaucoma: Secondary | ICD-10-CM | POA: Diagnosis not present

## 2019-03-24 DIAGNOSIS — I129 Hypertensive chronic kidney disease with stage 1 through stage 4 chronic kidney disease, or unspecified chronic kidney disease: Secondary | ICD-10-CM | POA: Diagnosis not present

## 2019-03-24 DIAGNOSIS — K2100 Gastro-esophageal reflux disease with esophagitis, without bleeding: Secondary | ICD-10-CM | POA: Diagnosis not present

## 2019-03-24 DIAGNOSIS — R339 Retention of urine, unspecified: Secondary | ICD-10-CM | POA: Diagnosis not present

## 2019-03-24 DIAGNOSIS — Y842 Radiological procedure and radiotherapy as the cause of abnormal reaction of the patient, or of later complication, without mention of misadventure at the time of the procedure: Secondary | ICD-10-CM | POA: Diagnosis not present

## 2019-03-24 DIAGNOSIS — T66XXXD Radiation sickness, unspecified, subsequent encounter: Secondary | ICD-10-CM | POA: Diagnosis not present

## 2019-03-24 DIAGNOSIS — I2510 Atherosclerotic heart disease of native coronary artery without angina pectoris: Secondary | ICD-10-CM | POA: Diagnosis not present

## 2019-03-24 DIAGNOSIS — Z452 Encounter for adjustment and management of vascular access device: Secondary | ICD-10-CM | POA: Diagnosis not present

## 2019-03-24 DIAGNOSIS — N179 Acute kidney failure, unspecified: Secondary | ICD-10-CM | POA: Diagnosis not present

## 2019-03-24 DIAGNOSIS — K5900 Constipation, unspecified: Secondary | ICD-10-CM | POA: Diagnosis not present

## 2019-03-24 DIAGNOSIS — C3491 Malignant neoplasm of unspecified part of right bronchus or lung: Secondary | ICD-10-CM | POA: Diagnosis not present

## 2019-03-24 DIAGNOSIS — E7800 Pure hypercholesterolemia, unspecified: Secondary | ICD-10-CM | POA: Diagnosis not present

## 2019-03-24 DIAGNOSIS — E876 Hypokalemia: Secondary | ICD-10-CM | POA: Diagnosis not present

## 2019-03-24 DIAGNOSIS — I480 Paroxysmal atrial fibrillation: Secondary | ICD-10-CM | POA: Diagnosis not present

## 2019-03-24 DIAGNOSIS — N182 Chronic kidney disease, stage 2 (mild): Secondary | ICD-10-CM | POA: Diagnosis not present

## 2019-03-24 DIAGNOSIS — Z9981 Dependence on supplemental oxygen: Secondary | ICD-10-CM | POA: Diagnosis not present

## 2019-03-24 DIAGNOSIS — R1312 Dysphagia, oropharyngeal phase: Secondary | ICD-10-CM | POA: Diagnosis not present

## 2019-03-24 DIAGNOSIS — E538 Deficiency of other specified B group vitamins: Secondary | ICD-10-CM | POA: Diagnosis not present

## 2019-03-24 NOTE — Progress Notes (Signed)
Nutrition Follow-up:  Patient with squamous cell carcinoma of left lung.  Patient receiving durvalumab currently on hold due to weight loss  Spoke with patient via phone for nutrition follow-up.  Patient reports that his appetite has improved from last week.  Was having issues with food feeling like it was getting stuck in mid chest area.  Reports diflucan helped symptoms.  Reports that he has received IV fluids as well.  Reports that he ate pancakes with butter and syrup this am.  Coughing frequently during conversation and could her shortness of breath.  Has been eating broccoli and cheese soup, potato soup, chicken noodle soup, sandwiches for lunch mostly.  Recently had flounder and baked potato with butter.  Drinking 2 ensure/boost shakes daily hasn't gotten to 3.      Medications: reviewed  Labs: reviewed  Anthropometrics:   Weight is 161 lb on 1/21 158 lb on 1/13 167 lb on 12/22   NUTRITION DIAGNOSIS: Inadequate oral intake continues   INTERVENTION:  Reviewed with patient ways to increase calories and protein in meals (adding whole milk/half and half to soups and extra meat chopped well, etc) Encouraged patient to eat at least 2 different foods at time (pancakes and eggs, or soup and sandwich, etc) Encouraged small frequent meals due to shortness of breath Encouraged patient to drink oral nutrition supplements between meals for added calories. Patient denied needing ensure at this time.     MONITORING, EVALUATION, GOAL: Patient will tolerate increased calories and protein to prevent further weight loss   NEXT VISIT: phone f/u Feb 19  Chaley Castellanos B. Zenia Resides, [place], [place] Registered Dietitian [phone] (pager)

## 2019-03-26 NOTE — Assessment & Plan Note (Signed)
1.  [name] squamous cell carcinoma left lung: -PET scan on 10/17/2018 showed hypermetabolic left lower lobe nodule, left hilar and contralateral right lower paratracheal lymph node along with right hilar lymph nodes.  Hypermetabolic subcarinal lymph nodes present.  Hypermetabolic lymph node in the porta hepatis with SUV 4.9, measuring 1.1 cm, favoring reactive adenopathy. -26 pound weight loss in the last 1 year, ex-smoker quit in 1975. -Left lower lobe lung biopsy on 10/31/2018 consistent with squamous cell carcinoma. - Weekly carbotaxol started on 11/21/2018 and radiation on 11/22/2018. -He received 3 weekly doses of carboplatin and paclitaxel, last dose on 12/05/2018.  Last XRT was on 12/09/2018. -He had prolonged hospitalization from 12/12/2018 through 12/26/2018 with severe radiation esophagitis and weakness.  He was subsequently discharged to a rehab facility.  He is currently been home since last 1 week.  He could not tolerate any further chemo or radiation. -CT chest with contrast on 01/25/2019 showed posterior left lower lobe cavitary nodule measuring 1.7 x 2.7 cm, previously 2.8 x 3.0 cm.  Improvement in size of the nodule meta stasis.  No new lesions were seen. -Durvalumab consolidation every 2 weeks started on 02/07/2019. -We reviewed results of the CT chest with contrast from 03/22/2019.  There is increased consolidation in the left lower lobe at the treatment site.  There is no clear progression of the tumor.  Increased consolidation was thought to be radiation effect. -I think the increased consolidation is highly suspicious for pneumonitis.  He does report some cough with clear sputum. -I will hold his durvalumab.  I will give a trial of prednisone 60 mg daily for 5 days.  I will also give empiric Diflucan 100 mg daily for 7 days. -I will reevaluate him in 1 week.  2.  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  Pacemaker was placed in November 2018. -Plavix on hold since hemoptysis.  He will  continue aspirin daily.  3.  Hypokalemia: -He will continue K-Lor 20 mEq daily.  His potassium is normal today.  4.  Weight loss: -He initially lost 9 pounds.  He gained about 3 pounds back. -He is drinking about 2 to 3 cans of boost per day.  He reports eating better.  Diflucan helped with swallowing.

## 2019-03-28 DIAGNOSIS — N182 Chronic kidney disease, stage 2 (mild): Secondary | ICD-10-CM | POA: Diagnosis not present

## 2019-03-28 DIAGNOSIS — E7800 Pure hypercholesterolemia, unspecified: Secondary | ICD-10-CM | POA: Diagnosis not present

## 2019-03-28 DIAGNOSIS — I129 Hypertensive chronic kidney disease with stage 1 through stage 4 chronic kidney disease, or unspecified chronic kidney disease: Secondary | ICD-10-CM | POA: Diagnosis not present

## 2019-03-28 DIAGNOSIS — Z9981 Dependence on supplemental oxygen: Secondary | ICD-10-CM | POA: Diagnosis not present

## 2019-03-28 DIAGNOSIS — Z452 Encounter for adjustment and management of vascular access device: Secondary | ICD-10-CM | POA: Diagnosis not present

## 2019-03-28 DIAGNOSIS — H409 Unspecified glaucoma: Secondary | ICD-10-CM | POA: Diagnosis not present

## 2019-03-28 DIAGNOSIS — E559 Vitamin D deficiency, unspecified: Secondary | ICD-10-CM | POA: Diagnosis not present

## 2019-03-28 DIAGNOSIS — N179 Acute kidney failure, unspecified: Secondary | ICD-10-CM | POA: Diagnosis not present

## 2019-03-28 DIAGNOSIS — R1312 Dysphagia, oropharyngeal phase: Secondary | ICD-10-CM | POA: Diagnosis not present

## 2019-03-28 DIAGNOSIS — Y842 Radiological procedure and radiotherapy as the cause of abnormal reaction of the patient, or of later complication, without mention of misadventure at the time of the procedure: Secondary | ICD-10-CM | POA: Diagnosis not present

## 2019-03-28 DIAGNOSIS — T66XXXD Radiation sickness, unspecified, subsequent encounter: Secondary | ICD-10-CM | POA: Diagnosis not present

## 2019-03-28 DIAGNOSIS — I480 Paroxysmal atrial fibrillation: Secondary | ICD-10-CM | POA: Diagnosis not present

## 2019-03-28 DIAGNOSIS — E538 Deficiency of other specified B group vitamins: Secondary | ICD-10-CM | POA: Diagnosis not present

## 2019-03-28 DIAGNOSIS — K2100 Gastro-esophageal reflux disease with esophagitis, without bleeding: Secondary | ICD-10-CM | POA: Diagnosis not present

## 2019-03-28 DIAGNOSIS — C3491 Malignant neoplasm of unspecified part of right bronchus or lung: Secondary | ICD-10-CM | POA: Diagnosis not present

## 2019-03-28 DIAGNOSIS — E876 Hypokalemia: Secondary | ICD-10-CM | POA: Diagnosis not present

## 2019-03-28 DIAGNOSIS — I2510 Atherosclerotic heart disease of native coronary artery without angina pectoris: Secondary | ICD-10-CM | POA: Diagnosis not present

## 2019-03-28 DIAGNOSIS — K5900 Constipation, unspecified: Secondary | ICD-10-CM | POA: Diagnosis not present

## 2019-03-28 DIAGNOSIS — R339 Retention of urine, unspecified: Secondary | ICD-10-CM | POA: Diagnosis not present

## 2019-03-29 DIAGNOSIS — I129 Hypertensive chronic kidney disease with stage 1 through stage 4 chronic kidney disease, or unspecified chronic kidney disease: Secondary | ICD-10-CM | POA: Diagnosis not present

## 2019-03-29 DIAGNOSIS — Z452 Encounter for adjustment and management of vascular access device: Secondary | ICD-10-CM | POA: Diagnosis not present

## 2019-03-29 DIAGNOSIS — R339 Retention of urine, unspecified: Secondary | ICD-10-CM | POA: Diagnosis not present

## 2019-03-29 DIAGNOSIS — I480 Paroxysmal atrial fibrillation: Secondary | ICD-10-CM | POA: Diagnosis not present

## 2019-03-29 DIAGNOSIS — R1312 Dysphagia, oropharyngeal phase: Secondary | ICD-10-CM | POA: Diagnosis not present

## 2019-03-29 DIAGNOSIS — N182 Chronic kidney disease, stage 2 (mild): Secondary | ICD-10-CM | POA: Diagnosis not present

## 2019-03-29 DIAGNOSIS — Z9981 Dependence on supplemental oxygen: Secondary | ICD-10-CM | POA: Diagnosis not present

## 2019-03-29 DIAGNOSIS — E876 Hypokalemia: Secondary | ICD-10-CM | POA: Diagnosis not present

## 2019-03-29 DIAGNOSIS — I2510 Atherosclerotic heart disease of native coronary artery without angina pectoris: Secondary | ICD-10-CM | POA: Diagnosis not present

## 2019-03-29 DIAGNOSIS — Y842 Radiological procedure and radiotherapy as the cause of abnormal reaction of the patient, or of later complication, without mention of misadventure at the time of the procedure: Secondary | ICD-10-CM | POA: Diagnosis not present

## 2019-03-29 DIAGNOSIS — N179 Acute kidney failure, unspecified: Secondary | ICD-10-CM | POA: Diagnosis not present

## 2019-03-29 DIAGNOSIS — H409 Unspecified glaucoma: Secondary | ICD-10-CM | POA: Diagnosis not present

## 2019-03-29 DIAGNOSIS — K2100 Gastro-esophageal reflux disease with esophagitis, without bleeding: Secondary | ICD-10-CM | POA: Diagnosis not present

## 2019-03-29 DIAGNOSIS — E559 Vitamin D deficiency, unspecified: Secondary | ICD-10-CM | POA: Diagnosis not present

## 2019-03-29 DIAGNOSIS — E7800 Pure hypercholesterolemia, unspecified: Secondary | ICD-10-CM | POA: Diagnosis not present

## 2019-03-29 DIAGNOSIS — C3491 Malignant neoplasm of unspecified part of right bronchus or lung: Secondary | ICD-10-CM | POA: Diagnosis not present

## 2019-03-29 DIAGNOSIS — K5900 Constipation, unspecified: Secondary | ICD-10-CM | POA: Diagnosis not present

## 2019-03-29 DIAGNOSIS — E538 Deficiency of other specified B group vitamins: Secondary | ICD-10-CM | POA: Diagnosis not present

## 2019-03-29 DIAGNOSIS — T66XXXD Radiation sickness, unspecified, subsequent encounter: Secondary | ICD-10-CM | POA: Diagnosis not present

## 2019-03-30 ENCOUNTER — Ambulatory Visit (HOSPITAL_COMMUNITY): Payer: Non-veteran care | Admitting: Hematology

## 2019-03-30 ENCOUNTER — Other Ambulatory Visit (HOSPITAL_COMMUNITY): Payer: Non-veteran care

## 2019-03-30 ENCOUNTER — Ambulatory Visit (HOSPITAL_COMMUNITY): Payer: Non-veteran care

## 2019-04-03 ENCOUNTER — Ambulatory Visit (HOSPITAL_COMMUNITY): Payer: Non-veteran care

## 2019-04-03 ENCOUNTER — Ambulatory Visit (HOSPITAL_COMMUNITY): Payer: Non-veteran care | Admitting: Hematology

## 2019-04-03 ENCOUNTER — Other Ambulatory Visit: Payer: Self-pay | Admitting: Cardiovascular Disease

## 2019-04-03 ENCOUNTER — Other Ambulatory Visit (HOSPITAL_COMMUNITY): Payer: Non-veteran care

## 2019-04-05 ENCOUNTER — Other Ambulatory Visit: Payer: Self-pay

## 2019-04-05 ENCOUNTER — Inpatient Hospital Stay (HOSPITAL_COMMUNITY): Payer: Medicare Other

## 2019-04-05 ENCOUNTER — Inpatient Hospital Stay (HOSPITAL_BASED_OUTPATIENT_CLINIC_OR_DEPARTMENT_OTHER): Payer: Medicare Other | Admitting: Hematology

## 2019-04-05 ENCOUNTER — Encounter (HOSPITAL_COMMUNITY): Payer: Self-pay | Admitting: Hematology

## 2019-04-05 ENCOUNTER — Inpatient Hospital Stay (HOSPITAL_COMMUNITY): Payer: Medicare Other | Attending: Hematology

## 2019-04-05 VITALS — BP 139/70 | HR 71 | Temp 97.7°F | Resp 20 | Wt 162.0 lb

## 2019-04-05 DIAGNOSIS — Z87891 Personal history of nicotine dependence: Secondary | ICD-10-CM | POA: Diagnosis not present

## 2019-04-05 DIAGNOSIS — Z79899 Other long term (current) drug therapy: Secondary | ICD-10-CM | POA: Insufficient documentation

## 2019-04-05 DIAGNOSIS — C3432 Malignant neoplasm of lower lobe, left bronchus or lung: Secondary | ICD-10-CM | POA: Diagnosis not present

## 2019-04-05 DIAGNOSIS — J189 Pneumonia, unspecified organism: Secondary | ICD-10-CM | POA: Insufficient documentation

## 2019-04-05 DIAGNOSIS — R634 Abnormal weight loss: Secondary | ICD-10-CM | POA: Diagnosis not present

## 2019-04-05 DIAGNOSIS — Z7952 Long term (current) use of systemic steroids: Secondary | ICD-10-CM | POA: Insufficient documentation

## 2019-04-05 DIAGNOSIS — Z950 Presence of cardiac pacemaker: Secondary | ICD-10-CM | POA: Insufficient documentation

## 2019-04-05 DIAGNOSIS — C3490 Malignant neoplasm of unspecified part of unspecified bronchus or lung: Secondary | ICD-10-CM | POA: Diagnosis not present

## 2019-04-05 DIAGNOSIS — I252 Old myocardial infarction: Secondary | ICD-10-CM | POA: Diagnosis not present

## 2019-04-05 DIAGNOSIS — Z7982 Long term (current) use of aspirin: Secondary | ICD-10-CM | POA: Insufficient documentation

## 2019-04-05 DIAGNOSIS — Z7902 Long term (current) use of antithrombotics/antiplatelets: Secondary | ICD-10-CM | POA: Insufficient documentation

## 2019-04-05 DIAGNOSIS — I2510 Atherosclerotic heart disease of native coronary artery without angina pectoris: Secondary | ICD-10-CM | POA: Insufficient documentation

## 2019-04-05 DIAGNOSIS — Z9221 Personal history of antineoplastic chemotherapy: Secondary | ICD-10-CM | POA: Insufficient documentation

## 2019-04-05 DIAGNOSIS — Z923 Personal history of irradiation: Secondary | ICD-10-CM | POA: Diagnosis not present

## 2019-04-05 DIAGNOSIS — E876 Hypokalemia: Secondary | ICD-10-CM | POA: Diagnosis not present

## 2019-04-05 LAB — COMPREHENSIVE METABOLIC PANEL
ALT: 14 U/L (ref 0–44)
AST: 15 U/L (ref 15–41)
Albumin: 3.4 g/dL — ABNORMAL LOW (ref 3.5–5.0)
Alkaline Phosphatase: 49 U/L (ref 38–126)
Anion gap: 6 (ref 5–15)
BUN: 24 mg/dL — ABNORMAL HIGH (ref 8–23)
CO2: 28 mmol/L (ref 22–32)
Calcium: 8.8 mg/dL — ABNORMAL LOW (ref 8.9–10.3)
Chloride: 101 mmol/L (ref 98–111)
Creatinine, Ser: 0.8 mg/dL (ref 0.61–1.24)
GFR calc Af Amer: >60 mL/min (ref >60)
GFR calc non Af Amer: >60 mL/min (ref >60)
Glucose, Bld: 144 mg/dL — ABNORMAL HIGH (ref 70–99)
Potassium: 3.8 mmol/L (ref 3.5–5.1)
Sodium: 135 mmol/L (ref 135–145)
Total Bilirubin: 0.8 mg/dL (ref 0.3–1.2)
Total Protein: 6.6 g/dL (ref 6.5–8.1)

## 2019-04-05 LAB — CBC WITH DIFFERENTIAL/PLATELET
Abs Immature Granulocytes: 0.02 10*3/uL (ref 0.00–0.07)
Basophils Absolute: 0 10*3/uL (ref 0.0–0.1)
Basophils Relative: 0 %
Eosinophils Absolute: 0.1 10*3/uL (ref 0.0–0.5)
Eosinophils Relative: 1 %
HCT: 37.4 % — ABNORMAL LOW (ref 39.0–52.0)
Hemoglobin: 11.5 g/dL — ABNORMAL LOW (ref 13.0–17.0)
Immature Granulocytes: 0 %
Lymphocytes Relative: 6 %
Lymphs Abs: 0.4 10*3/uL — ABNORMAL LOW (ref 0.7–4.0)
MCH: 32.6 pg (ref 26.0–34.0)
MCHC: 30.7 g/dL (ref 30.0–36.0)
MCV: 105.9 fL — ABNORMAL HIGH (ref 80.0–100.0)
Monocytes Absolute: 0.5 10*3/uL (ref 0.1–1.0)
Monocytes Relative: 7 %
Neutro Abs: 5.7 10*3/uL (ref 1.7–7.7)
Neutrophils Relative %: 86 %
Platelets: 183 10*3/uL (ref 150–400)
RBC: 3.53 MIL/uL — ABNORMAL LOW (ref 4.22–5.81)
RDW: 14.5 % (ref 11.5–15.5)
WBC: 6.7 10*3/uL (ref 4.0–10.5)
nRBC: 0 % (ref 0.0–0.2)

## 2019-04-05 MED ORDER — SODIUM CHLORIDE 0.9% FLUSH
10.0000 mL | Freq: Once | INTRAVENOUS | Status: AC
  Administered 2019-04-05: 10 mL

## 2019-04-05 MED ORDER — HEPARIN SOD (PORK) LOCK FLUSH 100 UNIT/ML IV SOLN
500.0000 [IU] | Freq: Once | INTRAVENOUS | Status: AC
  Administered 2019-04-05: 09:00:00 500 [IU] via INTRAVENOUS

## 2019-04-05 MED ORDER — PREDNISONE 20 MG PO TABS: 20.0000 mg | ORAL_TABLET | Freq: Every day | ORAL | 0 refills | Status: DC

## 2019-04-05 NOTE — Progress Notes (Signed)
HOLD Tx today. He will repeat another round of prednisone as well as a CT Chest. He will come back in 2 weeks for follow up and possible Tx.

## 2019-04-05 NOTE — Progress Notes (Signed)
Nicholas Sanchez, [place] [address]   CLINIC:  Medical Oncology/Hematology  PCP:  Sinda Du, MD No address on file None   REASON FOR VISIT:  Follow-up for lung cancer.  CURRENT THERAPY: Consolidation immunotherapy.   CANCER STAGING: Cancer Staging Squamous cell carcinoma of lung (HCC) Staging form: Lung, AJCC 8th Edition - Clinical stage from 11/09/2018: Stage IIIB (cT1c, cN3, cM0) - Unsigned    INTERVAL HISTORY:  Nicholas Sanchez 86 y.o. male seen for follow-up of lung cancer.  Immunotherapy is on hold secondary to pneumonitis.  He took 5 days of prednisone and felt well.  He gained about 1 pound.  He is eating better.  He still has some pain in the left posterior rib area.  Appetite is 75%.  Energy levels are 25%.  Shortness of breath on exertion present.    REVIEW OF SYSTEMS:  Review of Systems  Constitutional: Positive for fatigue.  Respiratory: Positive for cough and shortness of breath.   Gastrointestinal: Positive for constipation.  All other systems reviewed and are negative.    PAST MEDICAL/SURGICAL HISTORY:  Past Medical History:  Diagnosis Date  . Atherosclerotic heart disease of native coronary artery without angina pectoris   . Bradycardia, unspecified   . CAD (coronary artery disease)    a. STEMI 04/2008 s/p BMS to prox & distal RCA, staged DES to LAD same admission. b. Nuc 03/2017: scar but no ischemia, EF 45-54%. c. 09/2017: NSTEMI - DES x 2 to proximal and mid-RCA  . Cancer ([place])   . Chronic anticoagulation   . Chronic atrial fibrillation ([place])   . Chronic systolic (congestive) heart failure ([place])   . CKD (chronic kidney disease), stage II   . Cough, persistent   . GERD (gastroesophageal reflux disease)   . History of BPH   . HTN (hypertension)   . Hypercholesteremia   . LV dysfunction    a. EF 45-50% in 01/2016.  . Malignant neoplasm of unspecified part of unspecified bronchus or lung ([place])   . Mild pulmonary  hypertension ([place])   . Obesity, unspecified   . Other esophagitis without bleeding   . PAF (paroxysmal atrial fibrillation) ([place])   . Pneumonia, unspecified organism   . Port-A-Cath in place 11/21/2018  . Presence of cardiac pacemaker   . Presence of permanent cardiac pacemaker   . Sinus node dysfunction ([place])    a. s/p StJude CRT-pacemaker 01/2016.  [name] Tinnitus, bilateral   . Unspecified glaucoma    Past Surgical History:  Procedure Laterality Date  . BIOPSY  12/15/2018   Procedure: BIOPSY;  Surgeon: [name], MD;  [facility];  Service: Endoscopy;;  esophageal  . CARDIAC CATHETERIZATION    . COLONOSCOPY    . COLONOSCOPY N/A 08/27/2017   Dr. [name]: 25 5-25 mm polyps in descending colon, ascending, cecum. 2X3 cm carpet polyp in base of cecum lifted away s/p piecemeal polypectomy. Tubulovillous adenoma and tubular adenomas  . CORONARY STENT INTERVENTION N/A 10/22/2017   Procedure: CORONARY STENT INTERVENTION;  Surgeon: [name], MD;  Location: [employer] [facility];  Service: Cardiovascular;  Laterality: N/A;  . CORONARY STENT PLACEMENT  04/2008   RCA and LAD  . EP IMPLANTABLE DEVICE N/A 01/09/2016   Procedure: BiV Pacemaker Insertion CRT-P;  Surgeon: [name], MD;  Location: [facility];  Service: Cardiovascular;  Laterality: N/A;  . ESOPHAGOGASTRODUODENOSCOPY N/A 08/27/2017   normal esophagus s/p dilation, gastric petechia, small hiatal hernia,  normal duodenum  . ESOPHAGOGASTRODUODENOSCOPY (EGD) WITH PROPOFOL N/A 12/15/2018   severe esophagitis likely radiation-induced, medium-sized hiatal hernia, normal duodenum  . INSERT / REPLACE / REMOVE PACEMAKER    . MALONEY DILATION N/A 08/27/2017   Procedure: [name] DILATION;  Surgeon: [name], MD;  [facility];  Service: Endoscopy;  Laterality: N/A;  . PORTACATH PLACEMENT Right 11/18/2018   Procedure: INSERTION PORT-A-CATH;  Surgeon: [name], MD;  Location: [facility];  Service: General;   Laterality: Right;  . RIGHT/LEFT HEART CATH AND CORONARY ANGIOGRAPHY N/A 10/21/2017   Procedure: RIGHT/LEFT HEART CATH AND CORONARY ANGIOGRAPHY;  Surgeon: [name], MD;  Location: [facility];  Service: Cardiovascular;  Laterality: N/A;     SOCIAL HISTORY:  Social History   Socioeconomic History  . Marital status: Married    Spouse name: Air traffic controller  . Number of children: 3  . Years of education: Not on file  . Highest education level: Not on file  Occupational History  . Occupation: retired    Comment: Press photographer  Tobacco Use  . Smoking status: Former Smoker    Packs/day: 2.50    Years: 25.00    Pack years: 62.50    Types: Cigarettes    Quit date: 06/14/1973    Years since quitting: 45.8  . Smokeless tobacco: Former Systems developer    Types: Chew    Quit date: 06/14/1973  . Tobacco comment: some/chewed very little for about 2 years  Substance and Sexual Activity  . Alcohol use: No  . Drug use: No  . Sexual activity: Not on file  Other Topics Concern  . 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    Social Determinants of Health   Financial Resource Strain: Low Risk   . Difficulty of Paying Living Expenses: Not very hard  Food Insecurity: No Food Insecurity  . Worried About Charity fundraiser in the Last Year: Never true  . Ran Out of Food in the Last Year: Never true  Transportation Needs: No Transportation Needs  . Lack of Transportation (Medical): No  . Lack of Transportation (Non-Medical): No  Physical Activity: Inactive  . Days of Exercise per Week: 0 days  . Minutes of Exercise per Session: 0 min  Stress: No Stress Concern Present  . Feeling of Stress : Not at all  Social Connections: Slightly Isolated  . Frequency of Communication with Friends and Family: More than three times a week  . Frequency of Social Gatherings with Friends and Family: More than three times a week  . Attends Religious Services: More than 4 times per  year  . Active Member of Clubs or Organizations: No  . Attends Archivist Meetings: Never  . Marital Status: Married  Human resources officer Violence: Not At Risk  . Fear of Current or Ex-Partner: No  . Emotionally Abused: No  . Physically Abused: No  . Sexually Abused: No    FAMILY HISTORY:  Family History  Problem Relation Age of Onset  . Emphysema Mother        mother died with emphysema and cancer  . Cancer Mother   . Prostate cancer Brother   . Heart disease Brother   . Hypothyroidism Daughter   . Hypothyroidism Daughter   . Colon cancer Neg Hx   . Colon polyps Neg Hx     CURRENT MEDICATIONS:  Outpatient Encounter Medications as of 04/05/2019  Medication Sig  . acetaminophen (  TYLENOL) 500 MG tablet Take 500 mg by mouth every 6 (six) hours as needed for pain.  [name] aspirin EC 81 MG tablet Take 81 mg by mouth daily.  . cholecalciferol (VITAMIN D) 1000 units tablet Take 1,000 Units by mouth 2 (two) times daily.  . dorzolamide-timolol (COSOPT) 22.3-6.8 MG/ML ophthalmic solution Place 1 drop into the left eye 2 (two) times daily.  . fluconazole (DIFLUCAN) 100 MG tablet Take 1 tablet (100 mg total) by mouth daily.  . hydrochlorothiazide (HYDRODIURIL) 25 MG tablet TAKE ONE (1) TABLET BY MOUTH EVERY DAY  . metoprolol succinate (TOPROL-XL) 50 MG 24 hr tablet TAKE ONE (1) TABLET BY MOUTH EVERY DAY  . nitroGLYCERIN (NITROSTAT) 0.4 MG SL tablet Place 0.4 mg under the tongue every 5 (five) minutes as needed for chest pain.  . pantoprazole (PROTONIX) 40 MG tablet Take 1 tablet (40 mg total) by mouth daily before breakfast.  . potassium chloride 20 MEQ/15ML (10%) SOLN Take 15 mLs (20 mEq total) by mouth daily.  . potassium chloride SA (KLOR-CON) 20 MEQ tablet Take 2 tablets (40 mEq total) by mouth 2 (two) times daily. For 2 days  . predniSONE (DELTASONE) 20 MG tablet Take 1 tablet (20 mg total) by mouth daily with breakfast.  . sucralfate (CARAFATE) 1 GM/10ML suspension Take 10 mLs (1 g  total) by mouth 4 (four) times daily -  with meals and at bedtime. (Patient taking differently: Take 1 g by mouth 2 (two) times daily. )  . tamsulosin (FLOMAX) 0.4 MG CAPS capsule Take 0.4 mg by mouth daily.  . vitamin B-12 (CYANOCOBALAMIN) 1000 MCG tablet Take 1,000 mcg by mouth daily.  . [DISCONTINUED] predniSONE (DELTASONE) 20 MG tablet Take 3 tablets (60mg ) a day for 5 days   No facility-administered encounter medications on file as of 04/05/2019.    ALLERGIES:  Allergies  Allergen Reactions  . Ace Inhibitors Nausea Only  . Tape Rash    And Bandaids, too     PHYSICAL EXAM:  ECOG Performance status: 1  Vitals:   04/05/19 0818  BP: 139/70  Pulse: 71  Resp: 20  Temp: 97.7 F (36.5 C)  SpO2: 100%   Filed Weights   04/05/19 0818  Weight: 162 lb (73.5 kg)    Physical Exam Vitals reviewed.  Constitutional:      Appearance: Normal appearance.  Cardiovascular:     Rate and Rhythm: Normal rate and regular rhythm.     Heart sounds: Normal heart sounds.  Pulmonary:     Effort: Pulmonary effort is normal.     Breath sounds: Normal breath sounds.  Abdominal:     General: There is no distension.     Palpations: Abdomen is soft. There is no mass.  Musculoskeletal:        General: No swelling.  Skin:    General: Skin is warm.  Neurological:     General: No focal deficit present.     Mental Status: He is alert and oriented to person, place, and time.  Psychiatric:        Mood and Affect: Mood normal.        Behavior: Behavior normal.      LABORATORY DATA:  I have reviewed the labs as listed.  CBC    Component Value Date/Time   WBC 6.7 04/05/2019 0817   RBC 3.53 (L) 04/05/2019 0817   HGB 11.5 (L) 04/05/2019 0817   HCT 37.4 (L) 04/05/2019 0817   PLT 183 04/05/2019 0817   MCV 105.9 (  H) 04/05/2019 0817   MCH 32.6 04/05/2019 0817   MCHC 30.7 04/05/2019 0817   RDW 14.5 04/05/2019 0817   LYMPHSABS 0.4 (L) 04/05/2019 0817   MONOABS 0.5 04/05/2019 0817   EOSABS 0.1  04/05/2019 0817   BASOSABS 0.0 04/05/2019 0817   CMP Latest Ref Rng & Units 04/05/2019 03/23/2019 03/15/2019  Glucose 70 - 99 mg/dL 144(H) 139(H) 144(H)  BUN 8 - 23 mg/dL 24(H) 15 17  Creatinine 0.61 - 1.24 mg/dL 0.80 0.88 0.84  Sodium 135 - 145 mmol/L 135 134(L) 134(L)  Potassium 3.5 - 5.1 mmol/L 3.8 3.8 3.4(L)  Chloride 98 - 111 mmol/L 101 99 96(L)  CO2 22 - 32 mmol/L 28 27 28   Calcium 8.9 - 10.3 mg/dL 8.8(L) 9.2 9.7  Total Protein 6.5 - 8.1 g/dL 6.6 7.1 7.4  Total Bilirubin 0.3 - 1.2 mg/dL 0.8 0.8 1.2  Alkaline Phos 38 - 126 U/L 49 55 57  AST 15 - 41 U/L 15 15 15   ALT 0 - 44 U/L 14 11 11        DIAGNOSTIC IMAGING:  I have independently reviewed the scans and discussed with the patient.    ASSESSMENT & PLAN:   Squamous cell carcinoma of lung ([place]) 1.  [name] squamous cell carcinoma left lung: -PET scan on 10/17/2018 showed hypermetabolic left lower lobe nodule, left hilar and contralateral right lower paratracheal lymph node along with right hilar lymph nodes.  Hypermetabolic subcarinal lymph nodes present.  Hypermetabolic lymph node in the porta hepatis with SUV 4.9, measuring 1.1 cm, favoring reactive adenopathy. -26 pound weight loss in the last 1 year, ex-smoker quit in 1975. -Left lower lobe lung biopsy on 10/31/2018 consistent with squamous cell carcinoma. - Weekly carbotaxol started on 11/21/2018 and radiation on 11/22/2018. -He received 3 weekly doses of carboplatin and paclitaxel, last dose on 12/05/2018.  Last XRT was on 12/09/2018. -He had prolonged hospitalization from 12/12/2018 through 12/26/2018 with severe radiation esophagitis and weakness.  He was subsequently discharged to a rehab facility.  He is currently been home since last 1 week.  He could not tolerate any further chemo or radiation. -CT chest with contrast on 01/25/2019 showed posterior left lower lobe cavitary nodule measuring 1.7 x 2.7 cm, previously 2.8 x 3.0 cm.  Improvement in size of the nodule meta stasis.   No new lesions were seen. -Durvalumab consolidation every 2 weeks started on 02/07/2019. -CT scan on 03/22/2019 showed increased consolidation in the left lower lobe consistent with pneumonitis. -I have given prednisone 60 mg daily for 5 days.  He reported improvement in breathing.  He still has some pain in the left lower ribs posteriorly. -On auscultation today there is still crackles in the left base.  I will give him prednisone 20 mg for 7 days. -I plan to repeat CT of the chest to follow-up on the pneumonitis in 2 weeks.  We will hold his durvalumab today. -We will check his B12 and vitamin D levels.  2.  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  Pacemaker was placed in November 2018. -Plavix on hold since hemoptysis.  He will continue aspirin daily.  3.  Hypokalemia: -He will continue K-Lor 20 mEq daily.  His potassium is normal today.  4.  Weight loss: -He gained 1 pound from last visit. -He is eating well at this time.     Orders placed this encounter:  Orders Placed This Encounter  Procedures  . CT Chest W Contrast  . CBC with  Differential/Platelet  . Comprehensive metabolic panel  . Magnesium  . Vitamin D 25 hydroxy  . Vitamin B12     Derek Jack, [place] [phone]

## 2019-04-05 NOTE — Patient Instructions (Addendum)
[place] at [facility] Discharge Instructions  You were seen today by Dr. Delton Coombes. He went over your recent lab results and how you've been feeling since taking the prednisone. He will give you another round of prednisone to help with the cough and congestion. He will repeat your CT scan prior to you next visit. He will see you back in2 weeks for labs, treatement and follow up.   Thank you for choosing [place] at [facility] to provide your oncology and hematology care.  To afford each patient quality time with our provider, please arrive at least 15 minutes before your scheduled appointment time.   If you have a lab appointment with the [facility] please come in thru the  Main Entrance and check in at the main information desk  You need to re-schedule your appointment should you arrive 10 or more minutes late.  We strive to give you quality time with our providers, and arriving late affects you and other patients whose appointments are after yours.  Also, if you no show three or more times for appointments you may be dismissed from the clinic at the providers discretion.     Again, thank you for choosing [facility].  Our hope is that these requests will decrease the amount of time that you wait before being seen by our physicians.       _____________________________________________________________  Should you have questions after your visit to [facility], please contact our office at [phone] between the hours of 8:00 a.m. and 4:30 p.m.  Voicemails left after 4:00 p.m. will not be returned until the following business day.  For prescription refill requests, have your pharmacy contact our office and allow 72 hours.    Cancer Center Support Programs:   > Cancer Support Group  2nd Tuesday of the month 1pm-2pm, Journey Room

## 2019-04-05 NOTE — Progress Notes (Signed)
Patient presents today for treatment and follow up visit with Dr. Delton Coombes. LABS drawn and pending. Vital signs within parameters for treatment. Patient has no complaints of any changes since his last visit. Patient complains of pain in his back which he rates 5/10 on the left side.   Labs reviewed and within parameters for today's treatment.   Message received from [facility] LPN/ Dr. Delton Coombes. Hold treatment today.  Patient to return in two week for follow up and possible treatment. Repeat another Prednisone round and repeat CT of the chest.

## 2019-04-05 NOTE — Assessment & Plan Note (Signed)
1.  Nicholas Sanchez squamous cell carcinoma left lung: -PET scan on 10/17/2018 showed hypermetabolic left lower lobe nodule, left hilar and contralateral right lower paratracheal lymph node along with right hilar lymph nodes.  Hypermetabolic subcarinal lymph nodes present.  Hypermetabolic lymph node in the porta hepatis with SUV 4.9, measuring 1.1 cm, favoring reactive adenopathy. -26 pound weight loss in the last 1 year, ex-smoker quit in 1975. -Left lower lobe lung biopsy on 10/31/2018 consistent with squamous cell carcinoma. - Weekly carbotaxol started on 11/21/2018 and radiation on 11/22/2018. -He received 3 weekly doses of carboplatin and paclitaxel, last dose on 12/05/2018.  Last XRT was on 12/09/2018. -He had prolonged hospitalization from 12/12/2018 through 12/26/2018 with severe radiation esophagitis and weakness.  He was subsequently discharged to a rehab facility.  He is currently been home since last 1 week.  He could not tolerate any further chemo or radiation. -CT chest with contrast on 01/25/2019 showed posterior left lower lobe cavitary nodule measuring 1.7 x 2.7 cm, previously 2.8 x 3.0 cm.  Improvement in size of the nodule meta stasis.  No new lesions were seen. -Durvalumab consolidation every 2 weeks started on 02/07/2019. -CT scan on 03/22/2019 showed increased consolidation in the left lower lobe consistent with pneumonitis. -I have given prednisone 60 mg daily for 5 days.  He reported improvement in breathing.  He still has some pain in the left lower ribs posteriorly. -On auscultation today there is still crackles in the left base.  I will give him prednisone 20 mg for 7 days. -I plan to repeat CT of the chest to follow-up on the pneumonitis in 2 weeks.  We will hold his durvalumab today. -We will check his B12 and vitamin D levels.  2.  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  Pacemaker was placed in November 2018. -Plavix on hold since hemoptysis.  He will continue aspirin daily.  3.   Hypokalemia: -He will continue K-Lor 20 mEq daily.  His potassium is normal today.  4.  Weight loss: -He gained 1 pound from last visit. -He is eating well at this time.

## 2019-04-06 DIAGNOSIS — K5900 Constipation, unspecified: Secondary | ICD-10-CM | POA: Diagnosis not present

## 2019-04-06 DIAGNOSIS — Y842 Radiological procedure and radiotherapy as the cause of abnormal reaction of the patient, or of later complication, without mention of misadventure at the time of the procedure: Secondary | ICD-10-CM | POA: Diagnosis not present

## 2019-04-06 DIAGNOSIS — I480 Paroxysmal atrial fibrillation: Secondary | ICD-10-CM | POA: Diagnosis not present

## 2019-04-06 DIAGNOSIS — Z452 Encounter for adjustment and management of vascular access device: Secondary | ICD-10-CM | POA: Diagnosis not present

## 2019-04-06 DIAGNOSIS — T66XXXD Radiation sickness, unspecified, subsequent encounter: Secondary | ICD-10-CM | POA: Diagnosis not present

## 2019-04-06 DIAGNOSIS — H409 Unspecified glaucoma: Secondary | ICD-10-CM | POA: Diagnosis not present

## 2019-04-06 DIAGNOSIS — R1312 Dysphagia, oropharyngeal phase: Secondary | ICD-10-CM | POA: Diagnosis not present

## 2019-04-06 DIAGNOSIS — E538 Deficiency of other specified B group vitamins: Secondary | ICD-10-CM | POA: Diagnosis not present

## 2019-04-06 DIAGNOSIS — N179 Acute kidney failure, unspecified: Secondary | ICD-10-CM | POA: Diagnosis not present

## 2019-04-06 DIAGNOSIS — Z9981 Dependence on supplemental oxygen: Secondary | ICD-10-CM | POA: Diagnosis not present

## 2019-04-06 DIAGNOSIS — C3491 Malignant neoplasm of unspecified part of right bronchus or lung: Secondary | ICD-10-CM | POA: Diagnosis not present

## 2019-04-06 DIAGNOSIS — E7800 Pure hypercholesterolemia, unspecified: Secondary | ICD-10-CM | POA: Diagnosis not present

## 2019-04-06 DIAGNOSIS — N182 Chronic kidney disease, stage 2 (mild): Secondary | ICD-10-CM | POA: Diagnosis not present

## 2019-04-06 DIAGNOSIS — I129 Hypertensive chronic kidney disease with stage 1 through stage 4 chronic kidney disease, or unspecified chronic kidney disease: Secondary | ICD-10-CM | POA: Diagnosis not present

## 2019-04-06 DIAGNOSIS — E559 Vitamin D deficiency, unspecified: Secondary | ICD-10-CM | POA: Diagnosis not present

## 2019-04-06 DIAGNOSIS — R339 Retention of urine, unspecified: Secondary | ICD-10-CM | POA: Diagnosis not present

## 2019-04-06 DIAGNOSIS — K2100 Gastro-esophageal reflux disease with esophagitis, without bleeding: Secondary | ICD-10-CM | POA: Diagnosis not present

## 2019-04-06 DIAGNOSIS — I2510 Atherosclerotic heart disease of native coronary artery without angina pectoris: Secondary | ICD-10-CM | POA: Diagnosis not present

## 2019-04-06 DIAGNOSIS — E876 Hypokalemia: Secondary | ICD-10-CM | POA: Diagnosis not present

## 2019-04-07 DIAGNOSIS — I2510 Atherosclerotic heart disease of native coronary artery without angina pectoris: Secondary | ICD-10-CM | POA: Diagnosis not present

## 2019-04-07 DIAGNOSIS — N179 Acute kidney failure, unspecified: Secondary | ICD-10-CM | POA: Diagnosis not present

## 2019-04-07 DIAGNOSIS — N182 Chronic kidney disease, stage 2 (mild): Secondary | ICD-10-CM | POA: Diagnosis not present

## 2019-04-07 DIAGNOSIS — C3491 Malignant neoplasm of unspecified part of right bronchus or lung: Secondary | ICD-10-CM | POA: Diagnosis not present

## 2019-04-07 DIAGNOSIS — Z452 Encounter for adjustment and management of vascular access device: Secondary | ICD-10-CM | POA: Diagnosis not present

## 2019-04-07 DIAGNOSIS — R1312 Dysphagia, oropharyngeal phase: Secondary | ICD-10-CM | POA: Diagnosis not present

## 2019-04-07 DIAGNOSIS — K2100 Gastro-esophageal reflux disease with esophagitis, without bleeding: Secondary | ICD-10-CM | POA: Diagnosis not present

## 2019-04-07 DIAGNOSIS — H409 Unspecified glaucoma: Secondary | ICD-10-CM | POA: Diagnosis not present

## 2019-04-07 DIAGNOSIS — E538 Deficiency of other specified B group vitamins: Secondary | ICD-10-CM | POA: Diagnosis not present

## 2019-04-07 DIAGNOSIS — T66XXXD Radiation sickness, unspecified, subsequent encounter: Secondary | ICD-10-CM | POA: Diagnosis not present

## 2019-04-07 DIAGNOSIS — E7800 Pure hypercholesterolemia, unspecified: Secondary | ICD-10-CM | POA: Diagnosis not present

## 2019-04-07 DIAGNOSIS — R339 Retention of urine, unspecified: Secondary | ICD-10-CM | POA: Diagnosis not present

## 2019-04-07 DIAGNOSIS — E559 Vitamin D deficiency, unspecified: Secondary | ICD-10-CM | POA: Diagnosis not present

## 2019-04-07 DIAGNOSIS — Z9981 Dependence on supplemental oxygen: Secondary | ICD-10-CM | POA: Diagnosis not present

## 2019-04-07 DIAGNOSIS — K5900 Constipation, unspecified: Secondary | ICD-10-CM | POA: Diagnosis not present

## 2019-04-07 DIAGNOSIS — Y842 Radiological procedure and radiotherapy as the cause of abnormal reaction of the patient, or of later complication, without mention of misadventure at the time of the procedure: Secondary | ICD-10-CM | POA: Diagnosis not present

## 2019-04-07 DIAGNOSIS — I480 Paroxysmal atrial fibrillation: Secondary | ICD-10-CM | POA: Diagnosis not present

## 2019-04-07 DIAGNOSIS — I129 Hypertensive chronic kidney disease with stage 1 through stage 4 chronic kidney disease, or unspecified chronic kidney disease: Secondary | ICD-10-CM | POA: Diagnosis not present

## 2019-04-07 DIAGNOSIS — E876 Hypokalemia: Secondary | ICD-10-CM | POA: Diagnosis not present

## 2019-04-11 DIAGNOSIS — Z452 Encounter for adjustment and management of vascular access device: Secondary | ICD-10-CM | POA: Diagnosis not present

## 2019-04-11 DIAGNOSIS — I129 Hypertensive chronic kidney disease with stage 1 through stage 4 chronic kidney disease, or unspecified chronic kidney disease: Secondary | ICD-10-CM | POA: Diagnosis not present

## 2019-04-11 DIAGNOSIS — K5900 Constipation, unspecified: Secondary | ICD-10-CM | POA: Diagnosis not present

## 2019-04-11 DIAGNOSIS — E7800 Pure hypercholesterolemia, unspecified: Secondary | ICD-10-CM | POA: Diagnosis not present

## 2019-04-11 DIAGNOSIS — I2510 Atherosclerotic heart disease of native coronary artery without angina pectoris: Secondary | ICD-10-CM | POA: Diagnosis not present

## 2019-04-11 DIAGNOSIS — N182 Chronic kidney disease, stage 2 (mild): Secondary | ICD-10-CM | POA: Diagnosis not present

## 2019-04-11 DIAGNOSIS — H409 Unspecified glaucoma: Secondary | ICD-10-CM | POA: Diagnosis not present

## 2019-04-11 DIAGNOSIS — I480 Paroxysmal atrial fibrillation: Secondary | ICD-10-CM | POA: Diagnosis not present

## 2019-04-11 DIAGNOSIS — E876 Hypokalemia: Secondary | ICD-10-CM | POA: Diagnosis not present

## 2019-04-11 DIAGNOSIS — K2100 Gastro-esophageal reflux disease with esophagitis, without bleeding: Secondary | ICD-10-CM | POA: Diagnosis not present

## 2019-04-11 DIAGNOSIS — C3491 Malignant neoplasm of unspecified part of right bronchus or lung: Secondary | ICD-10-CM | POA: Diagnosis not present

## 2019-04-11 DIAGNOSIS — N179 Acute kidney failure, unspecified: Secondary | ICD-10-CM | POA: Diagnosis not present

## 2019-04-11 DIAGNOSIS — Z9981 Dependence on supplemental oxygen: Secondary | ICD-10-CM | POA: Diagnosis not present

## 2019-04-11 DIAGNOSIS — Y842 Radiological procedure and radiotherapy as the cause of abnormal reaction of the patient, or of later complication, without mention of misadventure at the time of the procedure: Secondary | ICD-10-CM | POA: Diagnosis not present

## 2019-04-11 DIAGNOSIS — E538 Deficiency of other specified B group vitamins: Secondary | ICD-10-CM | POA: Diagnosis not present

## 2019-04-11 DIAGNOSIS — R1312 Dysphagia, oropharyngeal phase: Secondary | ICD-10-CM | POA: Diagnosis not present

## 2019-04-11 DIAGNOSIS — R339 Retention of urine, unspecified: Secondary | ICD-10-CM | POA: Diagnosis not present

## 2019-04-11 DIAGNOSIS — E559 Vitamin D deficiency, unspecified: Secondary | ICD-10-CM | POA: Diagnosis not present

## 2019-04-11 DIAGNOSIS — T66XXXD Radiation sickness, unspecified, subsequent encounter: Secondary | ICD-10-CM | POA: Diagnosis not present

## 2019-04-17 DIAGNOSIS — R339 Retention of urine, unspecified: Secondary | ICD-10-CM | POA: Diagnosis not present

## 2019-04-17 DIAGNOSIS — E7800 Pure hypercholesterolemia, unspecified: Secondary | ICD-10-CM | POA: Diagnosis not present

## 2019-04-17 DIAGNOSIS — E559 Vitamin D deficiency, unspecified: Secondary | ICD-10-CM | POA: Diagnosis not present

## 2019-04-17 DIAGNOSIS — R1312 Dysphagia, oropharyngeal phase: Secondary | ICD-10-CM | POA: Diagnosis not present

## 2019-04-17 DIAGNOSIS — Z452 Encounter for adjustment and management of vascular access device: Secondary | ICD-10-CM | POA: Diagnosis not present

## 2019-04-17 DIAGNOSIS — N179 Acute kidney failure, unspecified: Secondary | ICD-10-CM | POA: Diagnosis not present

## 2019-04-17 DIAGNOSIS — E538 Deficiency of other specified B group vitamins: Secondary | ICD-10-CM | POA: Diagnosis not present

## 2019-04-17 DIAGNOSIS — I129 Hypertensive chronic kidney disease with stage 1 through stage 4 chronic kidney disease, or unspecified chronic kidney disease: Secondary | ICD-10-CM | POA: Diagnosis not present

## 2019-04-17 DIAGNOSIS — H409 Unspecified glaucoma: Secondary | ICD-10-CM | POA: Diagnosis not present

## 2019-04-17 DIAGNOSIS — Z9981 Dependence on supplemental oxygen: Secondary | ICD-10-CM | POA: Diagnosis not present

## 2019-04-17 DIAGNOSIS — K2100 Gastro-esophageal reflux disease with esophagitis, without bleeding: Secondary | ICD-10-CM | POA: Diagnosis not present

## 2019-04-17 DIAGNOSIS — I480 Paroxysmal atrial fibrillation: Secondary | ICD-10-CM | POA: Diagnosis not present

## 2019-04-17 DIAGNOSIS — T66XXXD Radiation sickness, unspecified, subsequent encounter: Secondary | ICD-10-CM | POA: Diagnosis not present

## 2019-04-17 DIAGNOSIS — K5900 Constipation, unspecified: Secondary | ICD-10-CM | POA: Diagnosis not present

## 2019-04-17 DIAGNOSIS — N182 Chronic kidney disease, stage 2 (mild): Secondary | ICD-10-CM | POA: Diagnosis not present

## 2019-04-17 DIAGNOSIS — E876 Hypokalemia: Secondary | ICD-10-CM | POA: Diagnosis not present

## 2019-04-17 DIAGNOSIS — Y842 Radiological procedure and radiotherapy as the cause of abnormal reaction of the patient, or of later complication, without mention of misadventure at the time of the procedure: Secondary | ICD-10-CM | POA: Diagnosis not present

## 2019-04-17 DIAGNOSIS — I2510 Atherosclerotic heart disease of native coronary artery without angina pectoris: Secondary | ICD-10-CM | POA: Diagnosis not present

## 2019-04-17 DIAGNOSIS — C3491 Malignant neoplasm of unspecified part of right bronchus or lung: Secondary | ICD-10-CM | POA: Diagnosis not present

## 2019-04-18 ENCOUNTER — Ambulatory Visit (INDEPENDENT_AMBULATORY_CARE_PROVIDER_SITE_OTHER): Payer: No Typology Code available for payment source | Admitting: *Deleted

## 2019-04-18 DIAGNOSIS — I482 Chronic atrial fibrillation, unspecified: Secondary | ICD-10-CM | POA: Diagnosis not present

## 2019-04-18 LAB — CUP PACEART REMOTE DEVICE CHECK
Battery Remaining Longevity: 80 mo
Battery Remaining Percentage: 95.5 %
Battery Voltage: 2.96 V
Brady Statistic AP VP Percent: 82 %
Brady Statistic AP VS Percent: 1 %
Brady Statistic AS VP Percent: 16 %
Brady Statistic AS VS Percent: 1 %
Brady Statistic RA Percent Paced: 18 %
Date Time Interrogation Session: 20210216112036
Implantable Lead Implant Date: 20171109
Implantable Lead Implant Date: 20171109
Implantable Lead Implant Date: 20171109
Implantable Lead Location: 753858
Implantable Lead Location: 753859
Implantable Lead Location: 753860
Implantable Pulse Generator Implant Date: 20171109
Lead Channel Impedance Value: 430 Ohm
Lead Channel Impedance Value: 540 Ohm
Lead Channel Impedance Value: 590 Ohm
Lead Channel Pacing Threshold Amplitude: 0.5 V
Lead Channel Pacing Threshold Amplitude: 0.5 V
Lead Channel Pacing Threshold Amplitude: 1.5 V
Lead Channel Pacing Threshold Pulse Width: 0.5 ms
Lead Channel Pacing Threshold Pulse Width: 0.5 ms
Lead Channel Pacing Threshold Pulse Width: 0.8 ms
Lead Channel Sensing Intrinsic Amplitude: 1.6 mV
Lead Channel Sensing Intrinsic Amplitude: 4.6 mV
Lead Channel Setting Pacing Amplitude: 2 V
Lead Channel Setting Pacing Amplitude: 2 V
Lead Channel Setting Pacing Amplitude: 2.5 V
Lead Channel Setting Pacing Pulse Width: 0.5 ms
Lead Channel Setting Pacing Pulse Width: 0.8 ms
Lead Channel Setting Sensing Sensitivity: 4 mV
Pulse Gen Model: 3262
Pulse Gen Serial Number: 7949810

## 2019-04-19 ENCOUNTER — Ambulatory Visit (HOSPITAL_COMMUNITY)
Admission: RE | Admit: 2019-04-19 | Discharge: 2019-04-19 | Disposition: A | Discharge status: Home / Self Care | Payer: Medicare Other | Source: Ambulatory Visit | Attending: Hematology | Admitting: Hematology

## 2019-04-19 ENCOUNTER — Other Ambulatory Visit: Payer: Self-pay

## 2019-04-19 ENCOUNTER — Inpatient Hospital Stay (HOSPITAL_COMMUNITY): Payer: Medicare Other

## 2019-04-19 DIAGNOSIS — J189 Pneumonia, unspecified organism: Secondary | ICD-10-CM | POA: Diagnosis not present

## 2019-04-19 DIAGNOSIS — Z79899 Other long term (current) drug therapy: Secondary | ICD-10-CM | POA: Diagnosis not present

## 2019-04-19 DIAGNOSIS — Z7982 Long term (current) use of aspirin: Secondary | ICD-10-CM | POA: Diagnosis not present

## 2019-04-19 DIAGNOSIS — E876 Hypokalemia: Secondary | ICD-10-CM | POA: Diagnosis not present

## 2019-04-19 DIAGNOSIS — Z9221 Personal history of antineoplastic chemotherapy: Secondary | ICD-10-CM | POA: Diagnosis not present

## 2019-04-19 DIAGNOSIS — C3490 Malignant neoplasm of unspecified part of unspecified bronchus or lung: Secondary | ICD-10-CM | POA: Diagnosis not present

## 2019-04-19 DIAGNOSIS — Z7902 Long term (current) use of antithrombotics/antiplatelets: Secondary | ICD-10-CM | POA: Diagnosis not present

## 2019-04-19 DIAGNOSIS — Z923 Personal history of irradiation: Secondary | ICD-10-CM | POA: Diagnosis not present

## 2019-04-19 DIAGNOSIS — I252 Old myocardial infarction: Secondary | ICD-10-CM | POA: Diagnosis not present

## 2019-04-19 DIAGNOSIS — J181 Lobar pneumonia, unspecified organism: Secondary | ICD-10-CM | POA: Diagnosis not present

## 2019-04-19 DIAGNOSIS — Z950 Presence of cardiac pacemaker: Secondary | ICD-10-CM | POA: Diagnosis not present

## 2019-04-19 DIAGNOSIS — Z87891 Personal history of nicotine dependence: Secondary | ICD-10-CM | POA: Diagnosis not present

## 2019-04-19 DIAGNOSIS — Z7952 Long term (current) use of systemic steroids: Secondary | ICD-10-CM | POA: Diagnosis not present

## 2019-04-19 DIAGNOSIS — C3432 Malignant neoplasm of lower lobe, left bronchus or lung: Secondary | ICD-10-CM | POA: Diagnosis not present

## 2019-04-19 DIAGNOSIS — I2510 Atherosclerotic heart disease of native coronary artery without angina pectoris: Secondary | ICD-10-CM | POA: Diagnosis not present

## 2019-04-19 LAB — VITAMIN D 25 HYDROXY (VIT D DEFICIENCY, FRACTURES): Vit D, 25-Hydroxy: 51.95 ng/mL (ref 30–100)

## 2019-04-19 LAB — COMPREHENSIVE METABOLIC PANEL
ALT: 12 U/L (ref 0–44)
AST: 12 U/L — ABNORMAL LOW (ref 15–41)
Albumin: 3.6 g/dL (ref 3.5–5.0)
Alkaline Phosphatase: 49 U/L (ref 38–126)
Anion gap: 6 (ref 5–15)
BUN: 16 mg/dL (ref 8–23)
CO2: 30 mmol/L (ref 22–32)
Calcium: 9 mg/dL (ref 8.9–10.3)
Chloride: 98 mmol/L (ref 98–111)
Creatinine, Ser: 0.77 mg/dL (ref 0.61–1.24)
GFR calc Af Amer: >60 mL/min (ref >60)
GFR calc non Af Amer: >60 mL/min (ref >60)
Glucose, Bld: 107 mg/dL — ABNORMAL HIGH (ref 70–99)
Potassium: 3.4 mmol/L — ABNORMAL LOW (ref 3.5–5.1)
Sodium: 134 mmol/L — ABNORMAL LOW (ref 135–145)
Total Bilirubin: 0.7 mg/dL (ref 0.3–1.2)
Total Protein: 6.8 g/dL (ref 6.5–8.1)

## 2019-04-19 LAB — CBC WITH DIFFERENTIAL/PLATELET
Abs Immature Granulocytes: 0.01 10*3/uL (ref 0.00–0.07)
Basophils Absolute: 0 10*3/uL (ref 0.0–0.1)
Basophils Relative: 0 %
Eosinophils Absolute: 0.1 10*3/uL (ref 0.0–0.5)
Eosinophils Relative: 2 %
HCT: 35.5 % — ABNORMAL LOW (ref 39.0–52.0)
Hemoglobin: 11 g/dL — ABNORMAL LOW (ref 13.0–17.0)
Immature Granulocytes: 0 %
Lymphocytes Relative: 6 %
Lymphs Abs: 0.4 10*3/uL — ABNORMAL LOW (ref 0.7–4.0)
MCH: 31.9 pg (ref 26.0–34.0)
MCHC: 31 g/dL (ref 30.0–36.0)
MCV: 102.9 fL — ABNORMAL HIGH (ref 80.0–100.0)
Monocytes Absolute: 0.6 10*3/uL (ref 0.1–1.0)
Monocytes Relative: 9 %
Neutro Abs: 4.9 10*3/uL (ref 1.7–7.7)
Neutrophils Relative %: 83 %
Platelets: 223 10*3/uL (ref 150–400)
RBC: 3.45 MIL/uL — ABNORMAL LOW (ref 4.22–5.81)
RDW: 14.6 % (ref 11.5–15.5)
WBC: 5.9 10*3/uL (ref 4.0–10.5)
nRBC: 0 % (ref 0.0–0.2)

## 2019-04-19 LAB — MAGNESIUM: Magnesium: 1.8 mg/dL (ref 1.7–2.4)

## 2019-04-19 LAB — VITAMIN B12: Vitamin B-12: 594 pg/mL (ref 180–914)

## 2019-04-19 MED ORDER — IOHEXOL 300 MG/ML  SOLN
75.0000 mL | Freq: Once | INTRAMUSCULAR | Status: AC | PRN
  Administered 2019-04-19: 09:00:00 75 mL via INTRAVENOUS

## 2019-04-19 NOTE — Progress Notes (Signed)
PPM Remote  

## 2019-04-20 ENCOUNTER — Ambulatory Visit (HOSPITAL_COMMUNITY): Payer: Non-veteran care | Admitting: Hematology

## 2019-04-20 ENCOUNTER — Other Ambulatory Visit (HOSPITAL_COMMUNITY): Payer: Non-veteran care

## 2019-04-20 ENCOUNTER — Ambulatory Visit (HOSPITAL_COMMUNITY): Payer: Non-veteran care

## 2019-04-21 ENCOUNTER — Other Ambulatory Visit: Payer: Self-pay

## 2019-04-21 ENCOUNTER — Inpatient Hospital Stay (HOSPITAL_COMMUNITY): Payer: Medicare Other

## 2019-04-21 ENCOUNTER — Encounter (HOSPITAL_COMMUNITY): Payer: Self-pay | Admitting: Hematology

## 2019-04-21 ENCOUNTER — Ambulatory Visit (HOSPITAL_COMMUNITY): Payer: Non-veteran care

## 2019-04-21 ENCOUNTER — Inpatient Hospital Stay (HOSPITAL_BASED_OUTPATIENT_CLINIC_OR_DEPARTMENT_OTHER): Payer: Medicare Other | Admitting: Hematology

## 2019-04-21 VITALS — BP 132/52 | HR 73 | Temp 96.9°F | Resp 18

## 2019-04-21 DIAGNOSIS — I252 Old myocardial infarction: Secondary | ICD-10-CM | POA: Diagnosis not present

## 2019-04-21 DIAGNOSIS — Z79899 Other long term (current) drug therapy: Secondary | ICD-10-CM | POA: Diagnosis not present

## 2019-04-21 DIAGNOSIS — Z87891 Personal history of nicotine dependence: Secondary | ICD-10-CM | POA: Diagnosis not present

## 2019-04-21 DIAGNOSIS — Z7952 Long term (current) use of systemic steroids: Secondary | ICD-10-CM | POA: Diagnosis not present

## 2019-04-21 DIAGNOSIS — Z923 Personal history of irradiation: Secondary | ICD-10-CM | POA: Diagnosis not present

## 2019-04-21 DIAGNOSIS — J189 Pneumonia, unspecified organism: Secondary | ICD-10-CM | POA: Diagnosis not present

## 2019-04-21 DIAGNOSIS — I2510 Atherosclerotic heart disease of native coronary artery without angina pectoris: Secondary | ICD-10-CM | POA: Diagnosis not present

## 2019-04-21 DIAGNOSIS — C3490 Malignant neoplasm of unspecified part of unspecified bronchus or lung: Secondary | ICD-10-CM

## 2019-04-21 DIAGNOSIS — E876 Hypokalemia: Secondary | ICD-10-CM | POA: Diagnosis not present

## 2019-04-21 DIAGNOSIS — C3432 Malignant neoplasm of lower lobe, left bronchus or lung: Secondary | ICD-10-CM | POA: Diagnosis not present

## 2019-04-21 DIAGNOSIS — Z7902 Long term (current) use of antithrombotics/antiplatelets: Secondary | ICD-10-CM | POA: Diagnosis not present

## 2019-04-21 DIAGNOSIS — Z95828 Presence of other vascular implants and grafts: Secondary | ICD-10-CM

## 2019-04-21 DIAGNOSIS — Z7982 Long term (current) use of aspirin: Secondary | ICD-10-CM | POA: Diagnosis not present

## 2019-04-21 DIAGNOSIS — Z9221 Personal history of antineoplastic chemotherapy: Secondary | ICD-10-CM | POA: Diagnosis not present

## 2019-04-21 DIAGNOSIS — Z950 Presence of cardiac pacemaker: Secondary | ICD-10-CM | POA: Diagnosis not present

## 2019-04-21 MED ORDER — SODIUM CHLORIDE 0.9 % IV SOLN: Freq: Once | INTRAVENOUS | Status: AC

## 2019-04-21 MED ORDER — SODIUM CHLORIDE 0.9 % IV SOLN
10.0000 mg/kg | Freq: Once | INTRAVENOUS | Status: AC
  Administered 2019-04-21: 740 mg via INTRAVENOUS
  Filled 2019-04-21: qty 10

## 2019-04-21 MED ORDER — SODIUM CHLORIDE 0.9% FLUSH
10.0000 mL | INTRAVENOUS | Status: DC | PRN
  Administered 2019-04-21: 10 mL

## 2019-04-21 MED ORDER — HEPARIN SOD (PORK) LOCK FLUSH 100 UNIT/ML IV SOLN
500.0000 [IU] | Freq: Once | INTRAVENOUS | Status: AC | PRN
  Administered 2019-04-21: 500 [IU]

## 2019-04-21 NOTE — Patient Instructions (Signed)
[place] at [facility] Discharge Instructions  You were seen today by Dr. Delton Coombes. He went over your recent lab and scan results. Your scan showed everything is stable and no new spots were found. He will see you back in 2 weeks for labs, treatment and follow up.   Thank you for choosing [place] at [facility] to provide your oncology and hematology care.  To afford each patient quality time with our provider, please arrive at least 15 minutes before your scheduled appointment time.   If you have a lab appointment with the [facility] [place] please come in thru the  Main Entrance and check in at the main information desk  You need to re-schedule your appointment should you arrive 10 or more minutes late.  We strive to give you quality time with our providers, and arriving late affects you and other patients whose appointments are after yours.  Also, if you no show three or more times for appointments you may be dismissed from the clinic at the providers discretion.     Again, thank you for choosing [facility].  Our hope is that these requests will decrease the amount of time that you wait before being seen by our physicians.       _____________________________________________________________  Should you have questions after your visit to [facility], please contact our office at [phone] between the hours of 8:00 a.m. and 4:30 p.m.  Voicemails left after 4:00 p.m. will not be returned until the following business day.  For prescription refill requests, have your pharmacy contact our office and allow 72 hours.    Cancer Center Support Programs:   > Cancer Support Group  2nd Tuesday of the month 1pm-2pm, Journey Room

## 2019-04-21 NOTE — Progress Notes (Signed)
Patient has been assessed, vital signs and labs have been reviewed by Dr. Katragadda. ANC, Creatinine, LFTs, and Platelets are within treatment parameters per Dr. Katragadda. The patient is good to proceed with treatment at this time.  

## 2019-04-21 NOTE — Assessment & Plan Note (Signed)
1.  [name] squamous cell carcinoma left lung: -Weekly carbotaxol along with radiation from 11/21/2018 through 12/05/2018.  Radiation stopped on 12/09/2018. -He had prolonged hospitalization due to severe radiation esophagitis and weakness. -Durvalumab consolidation every 2 weeks started on 02/07/2019.  Last dose was on 02/21/2019. -Durvalumab was held because of severe weight loss.  He was treated with prednisone briefly for shortness of breath. -I have reviewed CT chest with contrast from 04/19/2019 which showed persistent left lower lobe airspace consolidation, stable from 03/22/2019 compatible with radiation therapy.  Small left pleural effusion is stable.  Mild mediastinal adenopathy likely reactive.  Emphysema and interstitial lung disease has been stable.  I have compared it with CT scan from November 2020.  I did not see any evidence of immunotherapy related pneumonitis. -Patient's weight has been stable around 160 since January except at one time dropped to 148.  He recovered his weight. -His breathing is back to his baseline.  Hence I have recommended restarting durvalumab today.  I have reviewed his labs including CBC and LFTs which are acceptable for treatment.  2.  Hypokalemia: -His potassium today is 3.4.  He will continue K-Lor 20 mEq daily at home.  3.  Weight loss: -His weight has been around 160s since January 2021.  However he lost about 20 pounds since October of last year. -He is eating 3 meals per day and drinking 3 cans of boost per day. -We will closely watch his weights.  4.  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  Pacemaker was placed in November 2018. -Plavix on hold since he had hemoptysis.  He is continuing aspirin daily. -We will see him back in 2 weeks for follow-up.

## 2019-04-21 NOTE — Progress Notes (Signed)
[facility], [place] [address]   CLINIC:  Medical Oncology/Hematology  PCP:  Sinda Du, MD No address on file None   REASON FOR VISIT:  Follow-up for lung cancer.  CURRENT THERAPY: Consolidation immunotherapy.   CANCER STAGING: Cancer Staging Squamous cell carcinoma of lung (HCC) Staging form: Lung, AJCC 8th Edition - Clinical stage from 11/09/2018: Stage IIIB (cT1c, cN3, cM0) - Unsigned    INTERVAL HISTORY:  Nicholas Sanchez 86 y.o. male seen for follow-up of lung cancer.  Immunotherapy is on hold secondary to weight loss and shortness of breath.  His breathing has come back to his baseline.  Appetite is 100%.  Energy levels are 50%.  Weight has been stable around 160.  Chronic headaches are also stable.  Cough with clear expectoration is unchanged.  Denies any fevers or chills.    REVIEW OF SYSTEMS:  Review of Systems  Respiratory: Positive for cough and shortness of breath.   Neurological: Positive for headaches.  All other systems reviewed and are negative.    PAST MEDICAL/SURGICAL HISTORY:  Past Medical History:  Diagnosis Date  . Atherosclerotic heart disease of native coronary artery without angina pectoris   . Bradycardia, unspecified   . CAD (coronary artery disease)    a. STEMI 04/2008 s/p BMS to prox & distal RCA, staged DES to LAD same admission. b. Nuc 03/2017: scar but no ischemia, EF 45-54%. c. 09/2017: NSTEMI - DES x 2 to proximal and mid-RCA  . Cancer ([place])   . Chronic anticoagulation   . Chronic atrial fibrillation ([place])   . Chronic systolic (congestive) heart failure ([place])   . CKD (chronic kidney disease), stage II   . Cough, persistent   . GERD (gastroesophageal reflux disease)   . History of BPH   . HTN (hypertension)   . Hypercholesteremia   . LV dysfunction    a. EF 45-50% in 01/2016.  . Malignant neoplasm of unspecified part of unspecified bronchus or lung ([place])   . Mild pulmonary hypertension ([place])    . Obesity, unspecified   . Other esophagitis without bleeding   . PAF (paroxysmal atrial fibrillation) ([place])   . Pneumonia, unspecified organism   . Port-A-Cath in place 11/21/2018  . Presence of cardiac pacemaker   . Presence of permanent cardiac pacemaker   . Sinus node dysfunction ([place])    a. s/p StJude CRT-pacemaker 01/2016.  [name] Tinnitus, bilateral   . Unspecified glaucoma    Past Surgical History:  Procedure Laterality Date  . BIOPSY  12/15/2018   Procedure: BIOPSY;  Surgeon: [name], MD;  [facility];  Service: Endoscopy;;  esophageal  . CARDIAC CATHETERIZATION    . COLONOSCOPY    . COLONOSCOPY N/A 08/27/2017   Dr. [name]: 25 5-25 mm polyps in descending colon, ascending, cecum. 2X3 cm carpet polyp in base of cecum lifted away s/p piecemeal polypectomy. Tubulovillous adenoma and tubular adenomas  . CORONARY STENT INTERVENTION N/A 10/22/2017   Procedure: CORONARY STENT INTERVENTION;  Surgeon: [name], MD;  Location: [facility];  Service: Cardiovascular;  Laterality: N/A;  . CORONARY STENT PLACEMENT  04/2008   RCA and LAD  . EP IMPLANTABLE DEVICE N/A 01/09/2016   Procedure: BiV Pacemaker Insertion CRT-P;  Surgeon: [name], MD;  Location: [facility];  Service: Cardiovascular;  Laterality: N/A;  . ESOPHAGOGASTRODUODENOSCOPY N/A 08/27/2017   normal esophagus s/p dilation, gastric petechia, small hiatal hernia, normal duodenum  . ESOPHAGOGASTRODUODENOSCOPY (  EGD) WITH PROPOFOL N/A 12/15/2018   severe esophagitis likely radiation-induced, medium-sized hiatal hernia, normal duodenum  . INSERT / REPLACE / REMOVE PACEMAKER    . MALONEY DILATION N/A 08/27/2017   Procedure: [name] DILATION;  Surgeon: [name], MD;  [facility];  Service: Endoscopy;  Laterality: N/A;  . PORTACATH PLACEMENT Right 11/18/2018   Procedure: INSERTION PORT-A-CATH;  Surgeon: [name], MD;  Location: [facility];  Service: General;  Laterality: Right;    . RIGHT/LEFT HEART CATH AND CORONARY ANGIOGRAPHY N/A 10/21/2017   Procedure: RIGHT/LEFT HEART CATH AND CORONARY ANGIOGRAPHY;  Surgeon: [name], MD;  Location: [facility];  Service: Cardiovascular;  Laterality: N/A;     SOCIAL HISTORY:  Social History   Socioeconomic History  . Marital status: Married    Spouse name: Air traffic controller  . Number of children: 3  . Years of education: Not on file  . Highest education level: Not on file  Occupational History  . Occupation: retired    Comment: Press photographer  Tobacco Use  . Smoking status: Former Smoker    Packs/day: 2.50    Years: 25.00    Pack years: 62.50    Types: Cigarettes    Quit date: 06/14/1973    Years since quitting: 45.8  . Smokeless tobacco: Former Systems developer    Types: Chew    Quit date: 06/14/1973  . Tobacco comment: some/chewed very little for about 2 years  Substance and Sexual Activity  . Alcohol use: No  . Drug use: No  . Sexual activity: Not on file  Other Topics Concern  . 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    Social Determinants of Health   Financial Resource Strain: Low Risk   . Difficulty of Paying Living Expenses: Not very hard  Food Insecurity: No Food Insecurity  . Worried About Charity fundraiser in the Last Year: Never true  . Ran Out of Food in the Last Year: Never true  Transportation Needs: No Transportation Needs  . Lack of Transportation (Medical): No  . Lack of Transportation (Non-Medical): No  Physical Activity: Inactive  . Days of Exercise per Week: 0 days  . Minutes of Exercise per Session: 0 min  Stress: No Stress Concern Present  . Feeling of Stress : Not at all  Social Connections: Slightly Isolated  . Frequency of Communication with Friends and Family: More than three times a week  . Frequency of Social Gatherings with Friends and Family: More than three times a week  . Attends Religious Services: More than 4 times per year  . Active  Member of Clubs or Organizations: No  . Attends Archivist Meetings: Never  . Marital Status: Married  Human resources officer Violence: Not At Risk  . Fear of Current or Ex-Partner: No  . Emotionally Abused: No  . Physically Abused: No  . Sexually Abused: No    FAMILY HISTORY:  Family History  Problem Relation Age of Onset  . Emphysema Mother        mother died with emphysema and cancer  . Cancer Mother   . Prostate cancer Brother   . Heart disease Brother   . Hypothyroidism Daughter   . Hypothyroidism Daughter   . Colon cancer Neg Hx   . Colon polyps Neg Hx     CURRENT MEDICATIONS:  Outpatient Encounter Medications as of 04/21/2019  Medication Sig  . aspirin EC 81 MG tablet  Take 81 mg by mouth daily.  . cholecalciferol (VITAMIN D) 1000 units tablet Take 1,000 Units by mouth 2 (two) times daily.  . dorzolamide-timolol (COSOPT) 22.3-6.8 MG/ML ophthalmic solution Place 1 drop into the left eye 2 (two) times daily.  . hydrochlorothiazide (HYDRODIURIL) 25 MG tablet TAKE ONE (1) TABLET BY MOUTH EVERY DAY  . metoprolol succinate (TOPROL-XL) 50 MG 24 hr tablet TAKE ONE (1) TABLET BY MOUTH EVERY DAY  . pantoprazole (PROTONIX) 40 MG tablet Take 1 tablet (40 mg total) by mouth daily before breakfast.  . potassium chloride 20 MEQ/15ML (10%) SOLN Take 15 mLs (20 mEq total) by mouth daily.  . predniSONE (DELTASONE) 20 MG tablet Take 1 tablet (20 mg total) by mouth daily with breakfast.  . sucralfate (CARAFATE) 1 GM/10ML suspension Take 10 mLs (1 g total) by mouth 4 (four) times daily -  with meals and at bedtime. (Patient taking differently: Take 1 g by mouth 2 (two) times daily. )  . tamsulosin (FLOMAX) 0.4 MG CAPS capsule Take 0.4 mg by mouth daily.  . vitamin B-12 (CYANOCOBALAMIN) 1000 MCG tablet Take 1,000 mcg by mouth daily.  . [DISCONTINUED] fluconazole (DIFLUCAN) 100 MG tablet Take 1 tablet (100 mg total) by mouth daily.  . [DISCONTINUED] potassium chloride SA (KLOR-CON) 20 MEQ  tablet Take 2 tablets (40 mEq total) by mouth 2 (two) times daily. For 2 days  . acetaminophen (TYLENOL) 500 MG tablet Take 500 mg by mouth every 6 (six) hours as needed for pain.  . nitroGLYCERIN (NITROSTAT) 0.4 MG SL tablet Place 0.4 mg under the tongue every 5 (five) minutes as needed for chest pain.   No facility-administered encounter medications on file as of 04/21/2019.    ALLERGIES:  Allergies  Allergen Reactions  . Ace Inhibitors Nausea Only  . Tape Rash    And Bandaids, too     PHYSICAL EXAM:  ECOG Performance status: 1  Vitals:   04/21/19 1022  BP: 131/86  Pulse: 77  Resp: 19  Temp: (!) 96.9 F (36.1 C)  SpO2: 96%   Filed Weights   04/21/19 1022  Weight: 159 lb (72.1 kg)    Physical Exam Vitals reviewed.  Constitutional:      Appearance: Normal appearance.  Cardiovascular:     Rate and Rhythm: Normal rate and regular rhythm.     Heart sounds: Normal heart sounds.  Pulmonary:     Effort: Pulmonary effort is normal.     Breath sounds: Normal breath sounds.  Abdominal:     General: There is no distension.     Palpations: Abdomen is soft. There is no mass.  Musculoskeletal:        General: No swelling.  Skin:    General: Skin is warm.  Neurological:     General: No focal deficit present.     Mental Status: He is alert and oriented to person, place, and time.  Psychiatric:        Mood and Affect: Mood normal.        Behavior: Behavior normal.      LABORATORY DATA:  I have reviewed the labs as listed.  CBC    Component Value Date/Time   WBC 5.9 04/19/2019 1244   RBC 3.45 (L) 04/19/2019 1244   HGB 11.0 (L) 04/19/2019 1244   HCT 35.5 (L) 04/19/2019 1244   PLT 223 04/19/2019 1244   MCV 102.9 (H) 04/19/2019 1244   MCH 31.9 04/19/2019 1244   MCHC 31.0 04/19/2019 1244   RDW  14.6 04/19/2019 1244   LYMPHSABS 0.4 (L) 04/19/2019 1244   MONOABS 0.6 04/19/2019 1244   EOSABS 0.1 04/19/2019 1244   BASOSABS 0.0 04/19/2019 1244   CMP Latest Ref Rng &  Units 04/19/2019 04/05/2019 03/23/2019  Glucose 70 - 99 mg/dL 107(H) 144(H) 139(H)  BUN 8 - 23 mg/dL 16 24(H) 15  Creatinine 0.61 - 1.24 mg/dL 0.77 0.80 0.88  Sodium 135 - 145 mmol/L 134(L) 135 134(L)  Potassium 3.5 - 5.1 mmol/L 3.4(L) 3.8 3.8  Chloride 98 - 111 mmol/L 98 101 99  CO2 22 - 32 mmol/L 30 28 27   Calcium 8.9 - 10.3 mg/dL 9.0 8.8(L) 9.2  Total Protein 6.5 - 8.1 g/dL 6.8 6.6 7.1  Total Bilirubin 0.3 - 1.2 mg/dL 0.7 0.8 0.8  Alkaline Phos 38 - 126 U/L 49 49 55  AST 15 - 41 U/L 12(L) 15 15  ALT 0 - 44 U/L 12 14 11        DIAGNOSTIC IMAGING:  I have independently reviewed the scans and discussed with the patient.    ASSESSMENT & PLAN:   Squamous cell carcinoma of lung ([place]) 1.  [name] squamous cell carcinoma left lung: -Weekly carbotaxol along with radiation from 11/21/2018 through 12/05/2018.  Radiation stopped on 12/09/2018. -He had prolonged hospitalization due to severe radiation esophagitis and weakness. -Durvalumab consolidation every 2 weeks started on 02/07/2019.  Last dose was on 02/21/2019. -Durvalumab was held because of severe weight loss.  He was treated with prednisone briefly for shortness of breath. -I have reviewed CT chest with contrast from 04/19/2019 which showed persistent left lower lobe airspace consolidation, stable from 03/22/2019 compatible with radiation therapy.  Small left pleural effusion is stable.  Mild mediastinal adenopathy likely reactive.  Emphysema and interstitial lung disease has been stable.  I have compared it with CT scan from November 2020.  I did not see any evidence of immunotherapy related pneumonitis. -Patient's weight has been stable around 160 since January except at one time dropped to 148.  He recovered his weight. -His breathing is back to his baseline.  Hence I have recommended restarting durvalumab today.  I have reviewed his labs including CBC and LFTs which are acceptable for treatment.  2.  Hypokalemia: -His potassium today is  3.4.  He will continue K-Lor 20 mEq daily at home.  3.  Weight loss: -His weight has been around 160s since January 2021.  However he lost about 20 pounds since October of last year. -He is eating 3 meals per day and drinking 3 cans of boost per day. -We will closely watch his weights.  4.  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  Pacemaker was placed in November 2018. -Plavix on hold since he had hemoptysis.  He is continuing aspirin daily. -We will see him back in 2 weeks for follow-up.   Orders placed this encounter:  No orders of the defined types were placed in this encounter.    Nicholas Jack, MD [place] [phone]

## 2019-04-21 NOTE — Progress Notes (Signed)
Nutrition Follow-up:  Patient with squamous cell carcinoma of the left lung. Patient receiving durvalumab.    Met with patient during infusion.  Patient reports that appetite is good.  "I am eating like a hog."  Reports that he has been eating eggs and grits with bacon or oatmeal and applesauce for breakfast.  Soups for lunch.  Ate hamburger patty for supper last night and ice cream later on for bedtime snack.  Has been eating chicken salad, egg salad chocolate pudding and ice cream.  Drinking ensure enlive or equate plus shakes 2 times per day mostly.       Medications: reviewed  Labs: reviewed  Anthropometrics:   Weight 159 lb today, noted on 2/3 162 lb.     NUTRITION DIAGNOSIS: Inadequate oral intake stable     INTERVENTION:  Reviewed with patient again ways to increase calories and protein in current eating pattern.   Encouraged small frequent meals especially with shortness of breath.   Continue oral nutrition supplement (350 calorie) 2-3 times per day.  Complimentary case of ensure enlive given to patient today.   Patient has contact information     MONITORING, EVALUATION, GOAL: Patient will tolerate increased calories and protein to prevent further weight loss   NEXT VISIT: phone f/u [place] [facility]  Marzetta Lanza B. Zenia Resides, [place], [place] Registered Dietitian [phone] (pager)

## 2019-04-21 NOTE — Patient Instructions (Signed)
[facility] Discharge Instructions for Patients Receiving Chemotherapy  Today you received the following chemotherapy agents   To help prevent nausea and vomiting after your treatment, we encourage you to take your nausea medication   If you develop nausea and vomiting that is not controlled by your nausea medication, call the clinic.   BELOW ARE SYMPTOMS THAT SHOULD BE REPORTED IMMEDIATELY:  *FEVER GREATER THAN 100.5 F  *CHILLS WITH OR WITHOUT FEVER  NAUSEA AND VOMITING THAT IS NOT CONTROLLED WITH YOUR NAUSEA MEDICATION  *UNUSUAL SHORTNESS OF BREATH  *UNUSUAL BRUISING OR BLEEDING  TENDERNESS IN MOUTH AND THROAT WITH OR WITHOUT PRESENCE OF ULCERS  *URINARY PROBLEMS  *BOWEL PROBLEMS  UNUSUAL RASH Items with * indicate a potential emergency and should be followed up as soon as possible.  Feel free to call the clinic should you have any questions or concerns. The clinic phone number is [phone].  Please show the CHEMO ALERT CARD at check-in to the Emergency Department and triage nurse.   

## 2019-04-21 NOTE — Progress Notes (Signed)
Patient presents today for treatment and follow up visit with Dr. Delton Coombes. Vital signs within parameters for treatment. MAR reviewed. Labs drawn on 04/19/19. Labs within parameters for treatment. Patient has no complaints of pain today but states he has had headaches this week. Patient denies coughing. Last treatment was held on 04/05/19 per Dr. Delton Coombes to complete another cycle of Prednisone 20mg  , 1 tab daily for 7 days for Pneumonitis. Patient finished with Prednisone on 04/12/19.   Message received from [facility] LPN/ Dr. Delton Coombes proceed with treatment.   Treatment given today per MD orders. Tolerated infusion without adverse affects. Vital signs stable. No complaints at this time. Discharged from clinic via wheel chair. F/U with [facility] as scheduled.

## 2019-04-21 NOTE — Progress Notes (Signed)
04/22/19  Confirmed patient is not on Prednisone at this time.  He has completed the course from 04/05/19.  T.O. Dr Beckey Downing, LPN/Kaidon Kinker Ronnald Ramp, PharmD

## 2019-04-25 DIAGNOSIS — I2510 Atherosclerotic heart disease of native coronary artery without angina pectoris: Secondary | ICD-10-CM | POA: Diagnosis not present

## 2019-04-25 DIAGNOSIS — N179 Acute kidney failure, unspecified: Secondary | ICD-10-CM | POA: Diagnosis not present

## 2019-04-25 DIAGNOSIS — Z452 Encounter for adjustment and management of vascular access device: Secondary | ICD-10-CM | POA: Diagnosis not present

## 2019-04-25 DIAGNOSIS — R1312 Dysphagia, oropharyngeal phase: Secondary | ICD-10-CM | POA: Diagnosis not present

## 2019-04-25 DIAGNOSIS — E876 Hypokalemia: Secondary | ICD-10-CM | POA: Diagnosis not present

## 2019-04-25 DIAGNOSIS — K2100 Gastro-esophageal reflux disease with esophagitis, without bleeding: Secondary | ICD-10-CM | POA: Diagnosis not present

## 2019-04-25 DIAGNOSIS — E538 Deficiency of other specified B group vitamins: Secondary | ICD-10-CM | POA: Diagnosis not present

## 2019-04-25 DIAGNOSIS — K5900 Constipation, unspecified: Secondary | ICD-10-CM | POA: Diagnosis not present

## 2019-04-25 DIAGNOSIS — R339 Retention of urine, unspecified: Secondary | ICD-10-CM | POA: Diagnosis not present

## 2019-04-25 DIAGNOSIS — E7800 Pure hypercholesterolemia, unspecified: Secondary | ICD-10-CM | POA: Diagnosis not present

## 2019-04-25 DIAGNOSIS — I480 Paroxysmal atrial fibrillation: Secondary | ICD-10-CM | POA: Diagnosis not present

## 2019-04-25 DIAGNOSIS — E559 Vitamin D deficiency, unspecified: Secondary | ICD-10-CM | POA: Diagnosis not present

## 2019-04-25 DIAGNOSIS — H409 Unspecified glaucoma: Secondary | ICD-10-CM | POA: Diagnosis not present

## 2019-04-25 DIAGNOSIS — N182 Chronic kidney disease, stage 2 (mild): Secondary | ICD-10-CM | POA: Diagnosis not present

## 2019-04-25 DIAGNOSIS — C3491 Malignant neoplasm of unspecified part of right bronchus or lung: Secondary | ICD-10-CM | POA: Diagnosis not present

## 2019-04-25 DIAGNOSIS — T66XXXD Radiation sickness, unspecified, subsequent encounter: Secondary | ICD-10-CM | POA: Diagnosis not present

## 2019-04-25 DIAGNOSIS — I129 Hypertensive chronic kidney disease with stage 1 through stage 4 chronic kidney disease, or unspecified chronic kidney disease: Secondary | ICD-10-CM | POA: Diagnosis not present

## 2019-04-25 DIAGNOSIS — Z9981 Dependence on supplemental oxygen: Secondary | ICD-10-CM | POA: Diagnosis not present

## 2019-04-25 DIAGNOSIS — Y842 Radiological procedure and radiotherapy as the cause of abnormal reaction of the patient, or of later complication, without mention of misadventure at the time of the procedure: Secondary | ICD-10-CM | POA: Diagnosis not present

## 2019-05-04 ENCOUNTER — Encounter (HOSPITAL_COMMUNITY): Payer: Self-pay | Admitting: Hematology

## 2019-05-04 ENCOUNTER — Inpatient Hospital Stay (HOSPITAL_COMMUNITY): Payer: Medicare Other

## 2019-05-04 ENCOUNTER — Encounter (HOSPITAL_COMMUNITY): Payer: Self-pay

## 2019-05-04 ENCOUNTER — Other Ambulatory Visit: Payer: Self-pay

## 2019-05-04 ENCOUNTER — Inpatient Hospital Stay (HOSPITAL_COMMUNITY): Payer: Medicare Other | Attending: Hematology | Admitting: Hematology

## 2019-05-04 VITALS — BP 113/57 | HR 69 | Temp 97.5°F | Resp 18

## 2019-05-04 VITALS — BP 134/56 | HR 70 | Temp 97.5°F | Resp 18 | Wt 164.2 lb

## 2019-05-04 DIAGNOSIS — Z5112 Encounter for antineoplastic immunotherapy: Secondary | ICD-10-CM | POA: Insufficient documentation

## 2019-05-04 DIAGNOSIS — I252 Old myocardial infarction: Secondary | ICD-10-CM | POA: Diagnosis not present

## 2019-05-04 DIAGNOSIS — I2510 Atherosclerotic heart disease of native coronary artery without angina pectoris: Secondary | ICD-10-CM | POA: Insufficient documentation

## 2019-05-04 DIAGNOSIS — Z9221 Personal history of antineoplastic chemotherapy: Secondary | ICD-10-CM | POA: Insufficient documentation

## 2019-05-04 DIAGNOSIS — E876 Hypokalemia: Secondary | ICD-10-CM | POA: Diagnosis not present

## 2019-05-04 DIAGNOSIS — J90 Pleural effusion, not elsewhere classified: Secondary | ICD-10-CM | POA: Insufficient documentation

## 2019-05-04 DIAGNOSIS — Z950 Presence of cardiac pacemaker: Secondary | ICD-10-CM | POA: Insufficient documentation

## 2019-05-04 DIAGNOSIS — H409 Unspecified glaucoma: Secondary | ICD-10-CM | POA: Insufficient documentation

## 2019-05-04 DIAGNOSIS — Z87891 Personal history of nicotine dependence: Secondary | ICD-10-CM | POA: Diagnosis not present

## 2019-05-04 DIAGNOSIS — Z7901 Long term (current) use of anticoagulants: Secondary | ICD-10-CM | POA: Insufficient documentation

## 2019-05-04 DIAGNOSIS — Z7982 Long term (current) use of aspirin: Secondary | ICD-10-CM | POA: Diagnosis not present

## 2019-05-04 DIAGNOSIS — C3432 Malignant neoplasm of lower lobe, left bronchus or lung: Secondary | ICD-10-CM | POA: Diagnosis not present

## 2019-05-04 DIAGNOSIS — N182 Chronic kidney disease, stage 2 (mild): Secondary | ICD-10-CM | POA: Insufficient documentation

## 2019-05-04 DIAGNOSIS — I480 Paroxysmal atrial fibrillation: Secondary | ICD-10-CM | POA: Insufficient documentation

## 2019-05-04 DIAGNOSIS — C3490 Malignant neoplasm of unspecified part of unspecified bronchus or lung: Secondary | ICD-10-CM | POA: Diagnosis not present

## 2019-05-04 DIAGNOSIS — I5022 Chronic systolic (congestive) heart failure: Secondary | ICD-10-CM | POA: Insufficient documentation

## 2019-05-04 DIAGNOSIS — Z79899 Other long term (current) drug therapy: Secondary | ICD-10-CM | POA: Insufficient documentation

## 2019-05-04 DIAGNOSIS — I130 Hypertensive heart and chronic kidney disease with heart failure and stage 1 through stage 4 chronic kidney disease, or unspecified chronic kidney disease: Secondary | ICD-10-CM | POA: Diagnosis not present

## 2019-05-04 DIAGNOSIS — E7800 Pure hypercholesterolemia, unspecified: Secondary | ICD-10-CM | POA: Diagnosis not present

## 2019-05-04 DIAGNOSIS — Z95828 Presence of other vascular implants and grafts: Secondary | ICD-10-CM

## 2019-05-04 DIAGNOSIS — Z7902 Long term (current) use of antithrombotics/antiplatelets: Secondary | ICD-10-CM | POA: Diagnosis not present

## 2019-05-04 DIAGNOSIS — I482 Chronic atrial fibrillation, unspecified: Secondary | ICD-10-CM | POA: Insufficient documentation

## 2019-05-04 DIAGNOSIS — N400 Enlarged prostate without lower urinary tract symptoms: Secondary | ICD-10-CM | POA: Diagnosis not present

## 2019-05-04 DIAGNOSIS — Z923 Personal history of irradiation: Secondary | ICD-10-CM | POA: Diagnosis not present

## 2019-05-04 LAB — TSH: TSH: 2.226 u[IU]/mL (ref 0.350–4.500)

## 2019-05-04 LAB — CBC WITH DIFFERENTIAL/PLATELET
Abs Immature Granulocytes: 0.01 10*3/uL (ref 0.00–0.07)
Basophils Absolute: 0 10*3/uL (ref 0.0–0.1)
Basophils Relative: 0 %
Eosinophils Absolute: 0.1 10*3/uL (ref 0.0–0.5)
Eosinophils Relative: 1 %
HCT: 33.9 % — ABNORMAL LOW (ref 39.0–52.0)
Hemoglobin: 10.9 g/dL — ABNORMAL LOW (ref 13.0–17.0)
Immature Granulocytes: 0 %
Lymphocytes Relative: 5 %
Lymphs Abs: 0.3 10*3/uL — ABNORMAL LOW (ref 0.7–4.0)
MCH: 32.7 pg (ref 26.0–34.0)
MCHC: 32.2 g/dL (ref 30.0–36.0)
MCV: 101.8 fL — ABNORMAL HIGH (ref 80.0–100.0)
Monocytes Absolute: 0.4 10*3/uL (ref 0.1–1.0)
Monocytes Relative: 7 %
Neutro Abs: 5.7 10*3/uL (ref 1.7–7.7)
Neutrophils Relative %: 87 %
Platelets: 255 10*3/uL (ref 150–400)
RBC: 3.33 MIL/uL — ABNORMAL LOW (ref 4.22–5.81)
RDW: 15.2 % (ref 11.5–15.5)
WBC: 6.6 10*3/uL (ref 4.0–10.5)
nRBC: 0 % (ref 0.0–0.2)

## 2019-05-04 LAB — COMPREHENSIVE METABOLIC PANEL
ALT: 11 U/L (ref 0–44)
AST: 15 U/L (ref 15–41)
Albumin: 3.5 g/dL (ref 3.5–5.0)
Alkaline Phosphatase: 50 U/L (ref 38–126)
Anion gap: 9 (ref 5–15)
BUN: 20 mg/dL (ref 8–23)
CO2: 29 mmol/L (ref 22–32)
Calcium: 9.2 mg/dL (ref 8.9–10.3)
Chloride: 98 mmol/L (ref 98–111)
Creatinine, Ser: 0.86 mg/dL (ref 0.61–1.24)
GFR calc Af Amer: >60 mL/min (ref >60)
GFR calc non Af Amer: >60 mL/min (ref >60)
Glucose, Bld: 138 mg/dL — ABNORMAL HIGH (ref 70–99)
Potassium: 3.3 mmol/L — ABNORMAL LOW (ref 3.5–5.1)
Sodium: 136 mmol/L (ref 135–145)
Total Bilirubin: 0.9 mg/dL (ref 0.3–1.2)
Total Protein: 7 g/dL (ref 6.5–8.1)

## 2019-05-04 LAB — MAGNESIUM: Magnesium: 1.5 mg/dL — ABNORMAL LOW (ref 1.7–2.4)

## 2019-05-04 MED ORDER — MAGNESIUM OXIDE 400 (241.3 MG) MG PO TABS: 400.0000 mg | ORAL_TABLET | Freq: Two times a day (BID) | ORAL | 3 refills | Status: AC

## 2019-05-04 MED ORDER — MAGNESIUM SULFATE 2 GM/50ML IV SOLN
2.0000 g | Freq: Once | INTRAVENOUS | Status: AC
  Administered 2019-05-04: 2 g via INTRAVENOUS

## 2019-05-04 MED ORDER — POTASSIUM CHLORIDE 20 MEQ/15ML (10%) PO SOLN: 20.0000 meq | Freq: Every day | ORAL | 0 refills | Status: DC

## 2019-05-04 MED ORDER — HEPARIN SOD (PORK) LOCK FLUSH 100 UNIT/ML IV SOLN
500.0000 [IU] | Freq: Once | INTRAVENOUS | Status: AC | PRN
  Administered 2019-05-04: 500 [IU]

## 2019-05-04 MED ORDER — SODIUM CHLORIDE 0.9 % IV SOLN
10.0000 mg/kg | Freq: Once | INTRAVENOUS | Status: AC
  Administered 2019-05-04: 740 mg via INTRAVENOUS
  Filled 2019-05-04: qty 4.8

## 2019-05-04 MED ORDER — SODIUM CHLORIDE 0.9 % IV SOLN: Freq: Once | INTRAVENOUS | Status: AC

## 2019-05-04 MED ORDER — SODIUM CHLORIDE 0.9% FLUSH
10.0000 mL | INTRAVENOUS | Status: DC | PRN
  Administered 2019-05-04: 10 mL

## 2019-05-04 MED ORDER — MAGNESIUM SULFATE 2 GM/50ML IV SOLN
INTRAVENOUS | Status: AC
  Filled 2019-05-04: qty 50

## 2019-05-04 NOTE — Progress Notes (Signed)
0900 Labs reviewed with and pt seen by Dr. Delton Coombes and pt approved for Imfinzi infusion with Magnesium 2 grams IV added for today for Mag of 1.5 per MD                                                       Nicholas Sanchez tolerated Imfinzi and Magnesium infusions well without complaints or incident. VSS upon discharge. Pt discharged via wheelchair in satisfactory condition

## 2019-05-04 NOTE — Progress Notes (Signed)
Patient has been assessed, vital signs and labs have been reviewed by Dr. Delton Coombes. ANC, Creatinine, LFTs, and Platelets are within treatment parameters, magnesium is low today please give 2 grams of magnesium IV per Dr. Delton Coombes. The patient is good to proceed with treatment at this time.

## 2019-05-04 NOTE — Assessment & Plan Note (Addendum)
1.  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  Radiation discontinued on 12/09/2018 secondary to prolonged hospitalization from radiation esophagitis. -Durvalumab consolidation every 2 weeks started on 02/07/2019 and held after last dose on 02/21/2019 secondary to severe weight loss. -CT chest with contrast on 04/19/2019 showed persistent left lower lobe airspace consolidation, stable from 03/22/2019 compatible with radiation therapy.  Small left pleural effusion is stable.  Mild mediastinal adenopathy likely reactive.  Emphysema and interstitial lung disease has been stable.  We compared it with the CT scan from November 2020.  No evidence of immunotherapy related pneumonitis. -He was started back on durvalumab on 04/21/2019.  He did not experience any immunotherapy related side effects. -I have reviewed his labs today.  CBC is grossly within normal limits.  Hemoglobin is 10.9.  Creatinine is 0.86 and LFTs are normal.  He reported worsening ringing in the ears.  I have told him to follow-up with ENT. -He will proceed with his next cycle of Imfinzi today.  We will see him back in 2 weeks for follow-up.  2.  Weight loss: -He gained 5 pounds in the last 2 weeks.  He is eating better.  He is also drinking 3 cans of boost per day.  3.  Hypokalemia: -His potassium is 3.3 today.  He apparently ran out of K-Lor 20 mEq daily at home.  We will send a refill.  4.  Hypomagnesemia: -Magnesium today is 1.5.  He will receive IV magnesium.  We will start him on magnesium 400 mg twice daily.  5.  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  Pacemaker was placed in normal 2018. -Plavix on hold since he had hemoptysis.  He is continuing aspirin daily.

## 2019-05-04 NOTE — Patient Instructions (Signed)
[facility] Discharge Instructions  Labs drawn from portacath today   Thank you for choosing [facility] at [facility] to provide your oncology and hematology care.  To afford each patient quality time with our provider, please arrive at least 15 minutes before your scheduled appointment time.   If you have a lab appointment with the Cancer Center please come in thru the Main Entrance and check in at the main information desk.  You need to re-schedule your appointment should you arrive 10 or more minutes late.  We strive to give you quality time with our providers, and arriving late affects you and other patients whose appointments are after yours.  Also, if you no show three or more times for appointments you may be dismissed from the clinic at the providers discretion.     Again, thank you for choosing [facility].  Our hope is that these requests will decrease the amount of time that you wait before being seen by our physicians.       _____________________________________________________________  Should you have questions after your visit to [facility], please contact our office at [phone] between the hours of 8:00 a.m. and 4:30 p.m.  Voicemails left after 4:00 p.m. will not be returned until the following business day.  For prescription refill requests, have your pharmacy contact our office and allow 72 hours.    Due to Covid, you will need to wear a mask upon entering the hospital. If you do not have a mask, a mask will be given to you at the Main Entrance upon arrival. For doctor visits, patients may have 1 support person with them. For treatment visits, patients can not have anyone with them due to social distancing guidelines and our immunocompromised population.     

## 2019-05-04 NOTE — Patient Instructions (Signed)
[facility] Discharge Instructions for Patients Receiving Chemotherapy   Beginning January 23rd 2017 lab work for the [facility] will be done in the  Main lab at [facility] on 1st floor. If you have a lab appointment with the [facility] please come in thru the  Main Entrance and check in at the main information desk   Today you received the following chemotherapy agents Imfinzi as well as Magnesium infusion. Follow-up as scheduled. Call clinic for any questions or concerns  To help prevent nausea and vomiting after your treatment, we encourage you to take your nausea medication   If you develop nausea and vomiting, or diarrhea that is not controlled by your medication, call the clinic.  The clinic phone number is [phone]. Office hours are Monday-Friday 8:30am-5:00pm.  BELOW ARE SYMPTOMS THAT SHOULD BE REPORTED IMMEDIATELY:  *FEVER GREATER THAN 101.0 F  *CHILLS WITH OR WITHOUT FEVER  NAUSEA AND VOMITING THAT IS NOT CONTROLLED WITH YOUR NAUSEA MEDICATION  *UNUSUAL SHORTNESS OF BREATH  *UNUSUAL BRUISING OR BLEEDING  TENDERNESS IN MOUTH AND THROAT WITH OR WITHOUT PRESENCE OF ULCERS  *URINARY PROBLEMS  *BOWEL PROBLEMS  UNUSUAL RASH Items with * indicate a potential emergency and should be followed up as soon as possible. If you have an emergency after office hours please contact your primary care physician or go to the nearest emergency department.  Please call the clinic during office hours if you have any questions or concerns.   You may also contact the Patient Navigator at [phone] should you have any questions or need assistance in obtaining follow up care.      Resources For Cancer Patients and their Caregivers ? American Cancer Society: Can assist with transportation, wigs, general needs, runs Look Good Feel Better.        7608467054 ? Cancer Care: Provides financial assistance, online support groups, medication/co-pay  assistance.  1-800-813-HOPE (952)871-2694) ? [facility] Assists [place] cancer patients and their families through emotional , educational and financial support.  816 640 3336 ? [place] DSS Where to apply for food stamps, Medicaid and utility assistance. 773-337-5298 ? [facility]: Transportation to medical appointments. 574-687-4674 ? Social Security Administration: May apply for disability if have a Stage IV cancer. 434 736 4875 252 111 5696 ? [employer], Disability and Transit Services: Assists with nutrition, care and transit needs. (508)196-9650

## 2019-05-04 NOTE — Patient Instructions (Addendum)
[place] at [facility] Discharge Instructions  You were seen today by Dr. Delton Coombes. He went over your recent lab results. He will send in a refill for your potassium, he will also send in a prescription for magnesium 400mg  to take twice a day. He will see you back in 2 weeks for labs, treatment and follow up.   Thank you for choosing [place] at [facility] to provide your oncology and hematology care.  To afford each patient quality time with our provider, please arrive at least 15 minutes before your scheduled appointment time.   If you have a lab appointment with the [facility] please come in thru the  Main Entrance and check in at the main information desk  You need to re-schedule your appointment should you arrive 10 or more minutes late.  We strive to give you quality time with our providers, and arriving late affects you and other patients whose appointments are after yours.  Also, if you no show three or more times for appointments you may be dismissed from the clinic at the providers discretion.     Again, thank you for choosing [facility].  Our hope is that these requests will decrease the amount of time that you wait before being seen by our physicians.       _____________________________________________________________  Should you have questions after your visit to [facility], please contact our office at [phone] between the hours of 8:00 a.m. and 4:30 p.m.  Voicemails left after 4:00 p.m. will not be returned until the following business day.  For prescription refill requests, have your pharmacy contact our office and allow 72 hours.    Cancer Center Support Programs:   > Cancer Support Group  2nd Tuesday of the month 1pm-2pm, Journey Room

## 2019-05-04 NOTE — Progress Notes (Signed)
[name], [place] [address]   CLINIC:  Medical Oncology/Hematology  PCP:  Sinda Du, MD No address on file None   REASON FOR VISIT:  Follow-up for lung cancer.  CURRENT THERAPY: Consolidation immunotherapy.   CANCER STAGING: Cancer Staging Squamous cell carcinoma of lung (HCC) Staging form: Lung, AJCC 8th Edition - Clinical stage from 11/09/2018: Stage IIIB (cT1c, cN3, cM0) - Unsigned    INTERVAL HISTORY:  [name] 86 y.o. male seen for follow-up of lung cancer.  Appetite is 50%.  Energy levels are 25%.  He reports good appetite and has gained about 5 pounds since last visit 2 weeks ago.  He reports some worsening of ringing in the ears.  He had ringing in the ears for many years.  He reports some headaches associated with it.  Cough and shortness of breath on exertion are stable.   REVIEW OF SYSTEMS:  Review of Systems  HENT:   Positive for tinnitus.   Respiratory: Positive for cough and shortness of breath.   Neurological: Positive for headaches.  All other systems reviewed and are negative.    PAST MEDICAL/SURGICAL HISTORY:  Past Medical History:  Diagnosis Date  . Atherosclerotic heart disease of native coronary artery without angina pectoris   . Bradycardia, unspecified   . CAD (coronary artery disease)    a. STEMI 04/2008 s/p BMS to prox & distal RCA, staged DES to LAD same admission. b. Nuc 03/2017: scar but no ischemia, EF 45-54%. c. 09/2017: NSTEMI - DES x 2 to proximal and mid-RCA  . Cancer ([name])   . Chronic anticoagulation   . Chronic atrial fibrillation ([place])   . Chronic systolic (congestive) heart failure ([place])   . CKD (chronic kidney disease), stage II   . Cough, persistent   . GERD (gastroesophageal reflux disease)   . History of BPH   . HTN (hypertension)   . Hypercholesteremia   . LV dysfunction    a. EF 45-50% in 01/2016.  . Malignant neoplasm of unspecified part of unspecified bronchus or lung  ([name])   . Mild pulmonary hypertension ([place])   . Obesity, unspecified   . Other esophagitis without bleeding   . PAF (paroxysmal atrial fibrillation) ([place])   . Pneumonia, unspecified organism   . Port-A-Cath in place 11/21/2018  . Presence of cardiac pacemaker   . Presence of permanent cardiac pacemaker   . Sinus node dysfunction ([place])    a. s/p StJude CRT-pacemaker 01/2016.  [name] Tinnitus, bilateral   . Unspecified glaucoma    Past Surgical History:  Procedure Laterality Date  . BIOPSY  12/15/2018   Procedure: BIOPSY;  Surgeon: [name], MD;  [facility];  Service: Endoscopy;;  esophageal  . CARDIAC CATHETERIZATION    . COLONOSCOPY    . COLONOSCOPY N/A 08/27/2017   Dr. [name]: 25 5-25 mm polyps in descending colon, ascending, cecum. 2X3 cm carpet polyp in base of cecum lifted away s/p piecemeal polypectomy. Tubulovillous adenoma and tubular adenomas  . CORONARY STENT INTERVENTION N/A 10/22/2017   Procedure: CORONARY STENT INTERVENTION;  Surgeon: [name], MD;  Location: [place] [facility];  Service: Cardiovascular;  Laterality: N/A;  . CORONARY STENT PLACEMENT  04/2008   RCA and LAD  . EP IMPLANTABLE DEVICE N/A 01/09/2016   Procedure: BiV Pacemaker Insertion CRT-P;  Surgeon: [name], MD;  Location: [place] [facility];  Service: Cardiovascular;  Laterality: N/A;  . ESOPHAGOGASTRODUODENOSCOPY N/A 08/27/2017  normal esophagus s/p dilation, gastric petechia, small hiatal hernia, normal duodenum  . ESOPHAGOGASTRODUODENOSCOPY (EGD) WITH PROPOFOL N/A 12/15/2018   severe esophagitis likely radiation-induced, medium-sized hiatal hernia, normal duodenum  . INSERT / REPLACE / REMOVE PACEMAKER    . MALONEY DILATION N/A 08/27/2017   Procedure: [name] DILATION;  Surgeon: [name], MD;  [facility];  Service: Endoscopy;  Laterality: N/A;  . PORTACATH PLACEMENT Right 11/18/2018   Procedure: INSERTION PORT-A-CATH;  Surgeon: [name], MD;  Location:  [facility] ORS;  Service: General;  Laterality: Right;  . RIGHT/LEFT HEART CATH AND CORONARY ANGIOGRAPHY N/A 10/21/2017   Procedure: RIGHT/LEFT HEART CATH AND CORONARY ANGIOGRAPHY;  Surgeon: [name], MD;  Location: [place] [facility];  Service: Cardiovascular;  Laterality: N/A;     SOCIAL HISTORY:  Social History   Socioeconomic History  . Marital status: Married    Spouse name: Air traffic controller  . Number of children: 3  . Years of education: Not on file  . Highest education level: Not on file  Occupational History  . Occupation: retired    Comment: Press photographer  Tobacco Use  . Smoking status: Former Smoker    Packs/day: 2.50    Years: 25.00    Pack years: 62.50    Types: Cigarettes    Quit date: 06/14/1973    Years since quitting: 45.9  . Smokeless tobacco: Former Systems developer    Types: Chew    Quit date: 06/14/1973  . Tobacco comment: some/chewed very little for about 2 years  Substance and Sexual Activity  . Alcohol use: No  . Drug use: No  . Sexual activity: Not on file  Other Topics Concern  . 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    Social Determinants of Health   Financial Resource Strain: Low Risk   . Difficulty of Paying Living Expenses: Not very hard  Food Insecurity: No Food Insecurity  . Worried About Charity fundraiser in the Last Year: Never true  . Ran Out of Food in the Last Year: Never true  Transportation Needs: No Transportation Needs  . Lack of Transportation (Medical): No  . Lack of Transportation (Non-Medical): No  Physical Activity: Inactive  . Days of Exercise per Week: 0 days  . Minutes of Exercise per Session: 0 min  Stress: No Stress Concern Present  . Feeling of Stress : Not at all  Social Connections: Slightly Isolated  . Frequency of Communication with Friends and Family: More than three times a week  . Frequency of Social Gatherings with Friends and Family: More than three times a week  . Attends Religious  Services: More than 4 times per year  . Active Member of Clubs or Organizations: No  . Attends Archivist Meetings: Never  . Marital Status: Married  Human resources officer Violence: Not At Risk  . Fear of Current or Ex-Partner: No  . Emotionally Abused: No  . Physically Abused: No  . Sexually Abused: No    FAMILY HISTORY:  Family History  Problem Relation Age of Onset  . Emphysema Mother        mother died with emphysema and cancer  . Cancer Mother   . Prostate cancer Brother   . Heart disease Brother   . Hypothyroidism Daughter   . Hypothyroidism Daughter   . Colon cancer Neg Hx   . Colon polyps Neg Hx     CURRENT MEDICATIONS:  Outpatient Encounter Medications  as of 05/04/2019  Medication Sig  . acetaminophen (TYLENOL) 500 MG tablet Take 500 mg by mouth every 6 (six) hours as needed for pain.  [name] aspirin EC 81 MG tablet Take 81 mg by mouth daily.  . cholecalciferol (VITAMIN D) 1000 units tablet Take 1,000 Units by mouth 2 (two) times daily.  . dorzolamide-timolol (COSOPT) 22.3-6.8 MG/ML ophthalmic solution Place 1 drop into the left eye 2 (two) times daily.  . hydrochlorothiazide (HYDRODIURIL) 25 MG tablet TAKE ONE (1) TABLET BY MOUTH EVERY DAY  . magnesium oxide (MAG-OX) 400 (241.3 Mg) MG tablet Take 1 tablet (400 mg total) by mouth 2 (two) times daily.  . metoprolol succinate (TOPROL-XL) 50 MG 24 hr tablet TAKE ONE (1) TABLET BY MOUTH EVERY DAY  . nitroGLYCERIN (NITROSTAT) 0.4 MG SL tablet Place 0.4 mg under the tongue every 5 (five) minutes as needed for chest pain.  . pantoprazole (PROTONIX) 40 MG tablet Take 1 tablet (40 mg total) by mouth daily before breakfast.  . potassium chloride 20 MEQ/15ML (10%) SOLN Take 15 mLs (20 mEq total) by mouth daily.  . sucralfate (CARAFATE) 1 GM/10ML suspension Take 10 mLs (1 g total) by mouth 4 (four) times daily -  with meals and at bedtime. (Patient taking differently: Take 1 g by mouth 2 (two) times daily. )  . tamsulosin (FLOMAX)  0.4 MG CAPS capsule Take 0.4 mg by mouth daily.  . vitamin B-12 (CYANOCOBALAMIN) 1000 MCG tablet Take 1,000 mcg by mouth daily.  . [DISCONTINUED] potassium chloride 20 MEQ/15ML (10%) SOLN Take 15 mLs (20 mEq total) by mouth daily.  . [DISCONTINUED] predniSONE (DELTASONE) 20 MG tablet Take 1 tablet (20 mg total) by mouth daily with breakfast.   No facility-administered encounter medications on file as of 05/04/2019.    ALLERGIES:  Allergies  Allergen Reactions  . Ace Inhibitors Nausea Only  . Tape Rash    And Bandaids, too     PHYSICAL EXAM:  ECOG Performance status: 1  Vitals:   05/04/19 0803  BP: (!) 134/56  Pulse: 70  Resp: 18  Temp: (!) 97.5 F (36.4 C)  SpO2: 100%   Filed Weights   05/04/19 0803  Weight: 164 lb 3.2 oz (74.5 kg)    Physical Exam Vitals reviewed.  Constitutional:      Appearance: Normal appearance.  Cardiovascular:     Rate and Rhythm: Normal rate and regular rhythm.     Heart sounds: Normal heart sounds.  Pulmonary:     Effort: Pulmonary effort is normal.     Breath sounds: Normal breath sounds.  Abdominal:     General: There is no distension.     Palpations: Abdomen is soft. There is no mass.  Musculoskeletal:        General: No swelling.  Skin:    General: Skin is warm.  Neurological:     General: No focal deficit present.     Mental Status: He is alert and oriented to person, place, and time.  Psychiatric:        Mood and Affect: Mood normal.        Behavior: Behavior normal.      LABORATORY DATA:  I have reviewed the labs as listed.  CBC    Component Value Date/Time   WBC 6.6 05/04/2019 0811   RBC 3.33 (L) 05/04/2019 0811   HGB 10.9 (L) 05/04/2019 0811   HCT 33.9 (L) 05/04/2019 0811   PLT 255 05/04/2019 0811   MCV 101.8 (H) 05/04/2019 0737  MCH 32.7 05/04/2019 0811   MCHC 32.2 05/04/2019 0811   RDW 15.2 05/04/2019 0811   LYMPHSABS 0.3 (L) 05/04/2019 0811   MONOABS 0.4 05/04/2019 0811   EOSABS 0.1 05/04/2019 0811    BASOSABS 0.0 05/04/2019 0811   CMP Latest Ref Rng & Units 05/04/2019 04/19/2019 04/05/2019  Glucose 70 - 99 mg/dL 138(H) 107(H) 144(H)  BUN 8 - 23 mg/dL 20 16 24(H)  Creatinine 0.61 - 1.24 mg/dL 0.86 0.77 0.80  Sodium 135 - 145 mmol/L 136 134(L) 135  Potassium 3.5 - 5.1 mmol/L 3.3(L) 3.4(L) 3.8  Chloride 98 - 111 mmol/L 98 98 101  CO2 22 - 32 mmol/L 29 30 28   Calcium 8.9 - 10.3 mg/dL 9.2 9.0 8.8(L)  Total Protein 6.5 - 8.1 g/dL 7.0 6.8 6.6  Total Bilirubin 0.3 - 1.2 mg/dL 0.9 0.7 0.8  Alkaline Phos 38 - 126 U/L 50 49 49  AST 15 - 41 U/L 15 12(L) 15  ALT 0 - 44 U/L 11 12 14        DIAGNOSTIC IMAGING:  I have independently reviewed the images and discussed with the patient.    ASSESSMENT & PLAN:   Squamous cell carcinoma of lung ([place]) 1.  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  Radiation discontinued on 12/09/2018 secondary to prolonged hospitalization from radiation esophagitis. -Durvalumab consolidation every 2 weeks started on 02/07/2019 and held after last dose on 02/21/2019 secondary to severe weight loss. -CT chest with contrast on 04/19/2019 showed persistent left lower lobe airspace consolidation, stable from 03/22/2019 compatible with radiation therapy.  Small left pleural effusion is stable.  Mild mediastinal adenopathy likely reactive.  Emphysema and interstitial lung disease has been stable.  We compared it with the CT scan from November 2020.  No evidence of immunotherapy related pneumonitis. -He was started back on durvalumab on 04/21/2019.  He did not experience any immunotherapy related side effects. -I have reviewed his labs today.  CBC is grossly within normal limits.  Hemoglobin is 10.9.  Creatinine is 0.86 and LFTs are normal.  He reported worsening ringing in the ears.  I have told him to follow-up with ENT. -He will proceed with his next cycle of Imfinzi today.  We will see him back in 2  weeks for follow-up.  2.  Weight loss: -He gained 5 pounds in the last 2 weeks.  He is eating better.  He is also drinking 3 cans of boost per day.  3.  Hypokalemia: -His potassium is 3.3 today.  He apparently ran out of K-Lor 20 mEq daily at home.  We will send a refill.  4.  Hypomagnesemia: -Magnesium today is 1.5.  He will receive IV magnesium.  We will start him on magnesium 400 mg twice daily.  5.  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  Pacemaker was placed in normal 2018. -Plavix on hold since he had hemoptysis.  He is continuing aspirin daily.    Orders placed this encounter:  Orders Placed This Encounter  Procedures  . CBC with Differential/Platelet  . Comprehensive metabolic panel  . Magnesium     Derek Jack, MD [place] [phone]

## 2019-05-10 DIAGNOSIS — Y842 Radiological procedure and radiotherapy as the cause of abnormal reaction of the patient, or of later complication, without mention of misadventure at the time of the procedure: Secondary | ICD-10-CM | POA: Diagnosis not present

## 2019-05-10 DIAGNOSIS — I129 Hypertensive chronic kidney disease with stage 1 through stage 4 chronic kidney disease, or unspecified chronic kidney disease: Secondary | ICD-10-CM | POA: Diagnosis not present

## 2019-05-10 DIAGNOSIS — E7800 Pure hypercholesterolemia, unspecified: Secondary | ICD-10-CM | POA: Diagnosis not present

## 2019-05-10 DIAGNOSIS — K5900 Constipation, unspecified: Secondary | ICD-10-CM | POA: Diagnosis not present

## 2019-05-10 DIAGNOSIS — N182 Chronic kidney disease, stage 2 (mild): Secondary | ICD-10-CM | POA: Diagnosis not present

## 2019-05-10 DIAGNOSIS — Z452 Encounter for adjustment and management of vascular access device: Secondary | ICD-10-CM | POA: Diagnosis not present

## 2019-05-10 DIAGNOSIS — K2100 Gastro-esophageal reflux disease with esophagitis, without bleeding: Secondary | ICD-10-CM | POA: Diagnosis not present

## 2019-05-10 DIAGNOSIS — N179 Acute kidney failure, unspecified: Secondary | ICD-10-CM | POA: Diagnosis not present

## 2019-05-10 DIAGNOSIS — E538 Deficiency of other specified B group vitamins: Secondary | ICD-10-CM | POA: Diagnosis not present

## 2019-05-10 DIAGNOSIS — C3491 Malignant neoplasm of unspecified part of right bronchus or lung: Secondary | ICD-10-CM | POA: Diagnosis not present

## 2019-05-10 DIAGNOSIS — T66XXXD Radiation sickness, unspecified, subsequent encounter: Secondary | ICD-10-CM | POA: Diagnosis not present

## 2019-05-10 DIAGNOSIS — I2510 Atherosclerotic heart disease of native coronary artery without angina pectoris: Secondary | ICD-10-CM | POA: Diagnosis not present

## 2019-05-10 DIAGNOSIS — E876 Hypokalemia: Secondary | ICD-10-CM | POA: Diagnosis not present

## 2019-05-10 DIAGNOSIS — Z9981 Dependence on supplemental oxygen: Secondary | ICD-10-CM | POA: Diagnosis not present

## 2019-05-10 DIAGNOSIS — E559 Vitamin D deficiency, unspecified: Secondary | ICD-10-CM | POA: Diagnosis not present

## 2019-05-10 DIAGNOSIS — H409 Unspecified glaucoma: Secondary | ICD-10-CM | POA: Diagnosis not present

## 2019-05-10 DIAGNOSIS — I480 Paroxysmal atrial fibrillation: Secondary | ICD-10-CM | POA: Diagnosis not present

## 2019-05-10 DIAGNOSIS — R339 Retention of urine, unspecified: Secondary | ICD-10-CM | POA: Diagnosis not present

## 2019-05-10 DIAGNOSIS — R1312 Dysphagia, oropharyngeal phase: Secondary | ICD-10-CM | POA: Diagnosis not present

## 2019-05-12 ENCOUNTER — Ambulatory Visit (HOSPITAL_COMMUNITY): Payer: Non-veteran care

## 2019-05-12 ENCOUNTER — Other Ambulatory Visit (HOSPITAL_COMMUNITY): Payer: Self-pay

## 2019-05-12 NOTE — Progress Notes (Signed)
Nutrition Follow-up:  Patient with squamous cell carcinoma of the left lung.  Patient receiving durvalumab  Spoke with patient via phone for nutrition follow-up.  Patient reports that he is eating like a "hog".  Goes on to say that over the last 3 days appetite has not been that strong but wife has been encouraging him to eat.  Reports that still has issues swallowing some foods (oatmeal, puffed wheat cereal).  Uses lidocaine to help.  Tries to drink 3 oral nutrition supplement shakes daily but some days drinks less (ensure enlive, equate plus).  Reports getting ready to eat breakfast of eggs with chopped up bol[place], grits and vanilla shake.  Sometimes wife uses chopper (grinder) to chop foods for him to swallow better (ie pork chop).  Drinking water, gatorade, V-8 juice, shakes.      Medications: reviewed  Labs: reviewed  Anthropometrics:   Weight increased to 164 lb on 3/4 from 159 lb on 2/19    NUTRITION DIAGNOSIS: Inadequate oral intake improved with weight gain   INTERVENTION:  Continue oral nutrition supplements (350 calories) 2-3 times per day. Denied needing any at this time Patient to chop, grind, add moisture to foods for ease of swallowing Reviewed ways to add calories and protein Patient has contact information    MONITORING, EVALUATION, GOAL: Patient will tolerate increased calories and protein to prevent further weight loss   NEXT VISIT: phone f/u April 16  Cherre Kothari B. Zenia Resides, [place], [employer] Registered Dietitian [phone] (pager)

## 2019-05-12 NOTE — Progress Notes (Signed)
error 

## 2019-05-18 ENCOUNTER — Other Ambulatory Visit: Payer: Self-pay

## 2019-05-18 ENCOUNTER — Encounter (HOSPITAL_COMMUNITY): Payer: Self-pay | Admitting: Hematology

## 2019-05-18 ENCOUNTER — Inpatient Hospital Stay (HOSPITAL_COMMUNITY): Payer: Medicare Other

## 2019-05-18 ENCOUNTER — Inpatient Hospital Stay (HOSPITAL_BASED_OUTPATIENT_CLINIC_OR_DEPARTMENT_OTHER): Payer: Medicare Other | Admitting: Hematology

## 2019-05-18 VITALS — BP 116/50 | HR 70 | Temp 97.5°F | Resp 18 | Wt 157.4 lb

## 2019-05-18 DIAGNOSIS — C3432 Malignant neoplasm of lower lobe, left bronchus or lung: Secondary | ICD-10-CM | POA: Diagnosis not present

## 2019-05-18 DIAGNOSIS — Z95828 Presence of other vascular implants and grafts: Secondary | ICD-10-CM

## 2019-05-18 DIAGNOSIS — Z9221 Personal history of antineoplastic chemotherapy: Secondary | ICD-10-CM | POA: Diagnosis not present

## 2019-05-18 DIAGNOSIS — I130 Hypertensive heart and chronic kidney disease with heart failure and stage 1 through stage 4 chronic kidney disease, or unspecified chronic kidney disease: Secondary | ICD-10-CM | POA: Diagnosis not present

## 2019-05-18 DIAGNOSIS — C3490 Malignant neoplasm of unspecified part of unspecified bronchus or lung: Secondary | ICD-10-CM

## 2019-05-18 DIAGNOSIS — H409 Unspecified glaucoma: Secondary | ICD-10-CM | POA: Diagnosis not present

## 2019-05-18 DIAGNOSIS — I480 Paroxysmal atrial fibrillation: Secondary | ICD-10-CM | POA: Diagnosis not present

## 2019-05-18 DIAGNOSIS — I252 Old myocardial infarction: Secondary | ICD-10-CM | POA: Diagnosis not present

## 2019-05-18 DIAGNOSIS — Z7901 Long term (current) use of anticoagulants: Secondary | ICD-10-CM | POA: Diagnosis not present

## 2019-05-18 DIAGNOSIS — I482 Chronic atrial fibrillation, unspecified: Secondary | ICD-10-CM | POA: Diagnosis not present

## 2019-05-18 DIAGNOSIS — I2510 Atherosclerotic heart disease of native coronary artery without angina pectoris: Secondary | ICD-10-CM | POA: Diagnosis not present

## 2019-05-18 DIAGNOSIS — Z7902 Long term (current) use of antithrombotics/antiplatelets: Secondary | ICD-10-CM | POA: Diagnosis not present

## 2019-05-18 DIAGNOSIS — E7800 Pure hypercholesterolemia, unspecified: Secondary | ICD-10-CM | POA: Diagnosis not present

## 2019-05-18 DIAGNOSIS — N182 Chronic kidney disease, stage 2 (mild): Secondary | ICD-10-CM | POA: Diagnosis not present

## 2019-05-18 DIAGNOSIS — Z7982 Long term (current) use of aspirin: Secondary | ICD-10-CM | POA: Diagnosis not present

## 2019-05-18 DIAGNOSIS — E876 Hypokalemia: Secondary | ICD-10-CM | POA: Diagnosis not present

## 2019-05-18 DIAGNOSIS — I5022 Chronic systolic (congestive) heart failure: Secondary | ICD-10-CM | POA: Diagnosis not present

## 2019-05-18 DIAGNOSIS — Z950 Presence of cardiac pacemaker: Secondary | ICD-10-CM | POA: Diagnosis not present

## 2019-05-18 DIAGNOSIS — Z87891 Personal history of nicotine dependence: Secondary | ICD-10-CM | POA: Diagnosis not present

## 2019-05-18 DIAGNOSIS — J90 Pleural effusion, not elsewhere classified: Secondary | ICD-10-CM | POA: Diagnosis not present

## 2019-05-18 DIAGNOSIS — Z5112 Encounter for antineoplastic immunotherapy: Secondary | ICD-10-CM | POA: Diagnosis not present

## 2019-05-18 DIAGNOSIS — Z79899 Other long term (current) drug therapy: Secondary | ICD-10-CM | POA: Diagnosis not present

## 2019-05-18 DIAGNOSIS — Z923 Personal history of irradiation: Secondary | ICD-10-CM | POA: Diagnosis not present

## 2019-05-18 LAB — COMPREHENSIVE METABOLIC PANEL
ALT: 9 U/L (ref 0–44)
AST: 14 U/L — ABNORMAL LOW (ref 15–41)
Albumin: 3.9 g/dL (ref 3.5–5.0)
Alkaline Phosphatase: 57 U/L (ref 38–126)
Anion gap: 10 (ref 5–15)
BUN: 19 mg/dL (ref 8–23)
CO2: 27 mmol/L (ref 22–32)
Calcium: 9.7 mg/dL (ref 8.9–10.3)
Chloride: 99 mmol/L (ref 98–111)
Creatinine, Ser: 0.94 mg/dL (ref 0.61–1.24)
GFR calc Af Amer: >60 mL/min (ref >60)
GFR calc non Af Amer: >60 mL/min (ref >60)
Glucose, Bld: 131 mg/dL — ABNORMAL HIGH (ref 70–99)
Potassium: 3.8 mmol/L (ref 3.5–5.1)
Sodium: 136 mmol/L (ref 135–145)
Total Bilirubin: 0.8 mg/dL (ref 0.3–1.2)
Total Protein: 7.4 g/dL (ref 6.5–8.1)

## 2019-05-18 LAB — CBC WITH DIFFERENTIAL/PLATELET
Abs Immature Granulocytes: 0.03 10*3/uL (ref 0.00–0.07)
Basophils Absolute: 0 10*3/uL (ref 0.0–0.1)
Basophils Relative: 0 %
Eosinophils Absolute: 0.1 10*3/uL (ref 0.0–0.5)
Eosinophils Relative: 1 %
HCT: 36 % — ABNORMAL LOW (ref 39.0–52.0)
Hemoglobin: 11.1 g/dL — ABNORMAL LOW (ref 13.0–17.0)
Immature Granulocytes: 0 %
Lymphocytes Relative: 5 %
Lymphs Abs: 0.4 10*3/uL — ABNORMAL LOW (ref 0.7–4.0)
MCH: 31.4 pg (ref 26.0–34.0)
MCHC: 30.8 g/dL (ref 30.0–36.0)
MCV: 102 fL — ABNORMAL HIGH (ref 80.0–100.0)
Monocytes Absolute: 0.6 10*3/uL (ref 0.1–1.0)
Monocytes Relative: 7 %
Neutro Abs: 6.8 10*3/uL (ref 1.7–7.7)
Neutrophils Relative %: 87 %
Platelets: 296 10*3/uL (ref 150–400)
RBC: 3.53 MIL/uL — ABNORMAL LOW (ref 4.22–5.81)
RDW: 14.8 % (ref 11.5–15.5)
WBC: 7.9 10*3/uL (ref 4.0–10.5)
nRBC: 0 % (ref 0.0–0.2)

## 2019-05-18 LAB — MAGNESIUM: Magnesium: 1.8 mg/dL (ref 1.7–2.4)

## 2019-05-18 MED ORDER — HEPARIN SOD (PORK) LOCK FLUSH 100 UNIT/ML IV SOLN
500.0000 [IU] | Freq: Once | INTRAVENOUS | Status: AC | PRN
  Administered 2019-05-18: 500 [IU]

## 2019-05-18 MED ORDER — SODIUM CHLORIDE 0.9% FLUSH
10.0000 mL | INTRAVENOUS | Status: DC | PRN
  Administered 2019-05-18: 10 mL

## 2019-05-18 MED ORDER — SODIUM CHLORIDE 0.9 % IV SOLN: Freq: Once | INTRAVENOUS | Status: AC

## 2019-05-18 MED ORDER — NYSTATIN-TRIAMCINOLONE 100000-0.1 UNIT/GM-% EX OINT: 1.0000 "application " | TOPICAL_OINTMENT | Freq: Two times a day (BID) | CUTANEOUS | 0 refills | Status: AC

## 2019-05-18 MED ORDER — SODIUM CHLORIDE 0.9 % IV SOLN
10.0000 mg/kg | Freq: Once | INTRAVENOUS | Status: AC
  Administered 2019-05-18: 740 mg via INTRAVENOUS
  Filled 2019-05-18: qty 10

## 2019-05-18 NOTE — Patient Instructions (Signed)
[facility] Discharge Instructions for Patients Receiving Chemotherapy   Beginning January 23rd 2017 lab work for the Cancer Center will be done in the  Main lab at [place] on 1st floor. If you have a lab appointment with the Cancer Center please come in thru the  Main Entrance and check in at the main information desk   Today you received the following chemotherapy agents Imfinzi  To help prevent nausea and vomiting after your treatment, we encourage you to take your nausea medication   If you develop nausea and vomiting, or diarrhea that is not controlled by your medication, call the clinic.  The clinic phone number is [phone]. Office hours are Monday-Friday 8:30am-5:00pm.  BELOW ARE SYMPTOMS THAT SHOULD BE REPORTED IMMEDIATELY:  *FEVER GREATER THAN 101.0 F  *CHILLS WITH OR WITHOUT FEVER  NAUSEA AND VOMITING THAT IS NOT CONTROLLED WITH YOUR NAUSEA MEDICATION  *UNUSUAL SHORTNESS OF BREATH  *UNUSUAL BRUISING OR BLEEDING  TENDERNESS IN MOUTH AND THROAT WITH OR WITHOUT PRESENCE OF ULCERS  *URINARY PROBLEMS  *BOWEL PROBLEMS  UNUSUAL RASH Items with * indicate a potential emergency and should be followed up as soon as possible. If you have an emergency after office hours please contact your primary care physician or go to the nearest emergency department.  Please call the clinic during office hours if you have any questions or concerns.   You may also contact the Patient Navigator at [phone] should you have any questions or need assistance in obtaining follow up care.      Resources For Cancer Patients and their Caregivers ? American Cancer Society: Can assist with transportation, wigs, general needs, runs Look Good Feel Better.        1-888-227-6333 ? Cancer Care: Provides financial assistance, online support groups, medication/co-pay assistance.  1-800-813-HOPE (4673) ? [facility] Assists [place] cancer  patients and their families through emotional , educational and financial support.  336-427-4357 ? [place] DSS Where to apply for food stamps, Medicaid and utility assistance. 336-342-1394 ? [facility]: Transportation to medical appointments. 336-347-2287 ? Social Security Administration: May apply for disability if have a Stage IV cancer. 336-342-7796 1-800-772-1213 ? [place] Aging, Disability and Transit Services: Assists with nutrition, care and transit needs. 336-349-2343          

## 2019-05-18 NOTE — Progress Notes (Signed)
[address] [place], [place] [address]   CLINIC:  Medical Oncology/Hematology  PCP:  Sinda Du, MD No address on file None   REASON FOR VISIT:  Follow-up for lung cancer.  CURRENT THERAPY: Consolidation immunotherapy.   CANCER STAGING: Cancer Staging Squamous cell carcinoma of lung (HCC) Staging form: Lung, AJCC 8th Edition - Clinical stage from 11/09/2018: Stage IIIB (cT1c, cN3, cM0) - Unsigned    INTERVAL HISTORY:  Nicholas Sanchez 86 y.o. male seen for follow-up of lung cancer.  Appetite and energy levels are 25%.  Reports some discharge from the umbilicus.  Cough and shortness of breath on exertion are stable.  He has lost 3 pounds since last 2 weeks.  He reports that he is getting bored after eating same stuff.  He is drinking about 3 cans equate brand nutritional supplement.   REVIEW OF SYSTEMS:  Review of Systems  Respiratory: Positive for cough and shortness of breath.   Neurological: Positive for headaches.  All other systems reviewed and are negative.    PAST MEDICAL/SURGICAL HISTORY:  Past Medical History:  Diagnosis Date  . Atherosclerotic heart disease of native coronary artery without angina pectoris   . Bradycardia, unspecified   . CAD (coronary artery disease)    a. STEMI 04/2008 s/p BMS to prox & distal RCA, staged DES to LAD same admission. b. Nuc 03/2017: scar but no ischemia, EF 45-54%. c. 09/2017: NSTEMI - DES x 2 to proximal and mid-RCA  . Cancer ([place])   . Chronic anticoagulation   . Chronic atrial fibrillation ([place])   . Chronic systolic (congestive) heart failure ([place])   . CKD (chronic kidney disease), stage II   . Cough, persistent   . GERD (gastroesophageal reflux disease)   . History of BPH   . HTN (hypertension)   . Hypercholesteremia   . LV dysfunction    a. EF 45-50% in 01/2016.  . Malignant neoplasm of unspecified part of unspecified bronchus or lung ([place])   . Mild pulmonary hypertension ([place])   .  Obesity, unspecified   . Other esophagitis without bleeding   . PAF (paroxysmal atrial fibrillation) ([place])   . Pneumonia, unspecified organism   . Port-A-Cath in place 11/21/2018  . Presence of cardiac pacemaker   . Presence of permanent cardiac pacemaker   . Sinus node dysfunction ([place])    a. s/p StJude CRT-pacemaker 01/2016.  [name] Tinnitus, bilateral   . Unspecified glaucoma    Past Surgical History:  Procedure Laterality Date  . BIOPSY  12/15/2018   Procedure: BIOPSY;  Surgeon: [name], MD;  [facility];  Service: Endoscopy;;  esophageal  . CARDIAC CATHETERIZATION    . COLONOSCOPY    . COLONOSCOPY N/A 08/27/2017   Dr. [name]: 25 5-25 mm polyps in descending colon, ascending, cecum. 2X3 cm carpet polyp in base of cecum lifted away s/p piecemeal polypectomy. Tubulovillous adenoma and tubular adenomas  . CORONARY STENT INTERVENTION N/A 10/22/2017   Procedure: CORONARY STENT INTERVENTION;  Surgeon: [name], MD;  Location: [facility];  Service: Cardiovascular;  Laterality: N/A;  . CORONARY STENT PLACEMENT  04/2008   RCA and LAD  . EP IMPLANTABLE DEVICE N/A 01/09/2016   Procedure: BiV Pacemaker Insertion CRT-P;  Surgeon: [name], MD;  Location: [facility];  Service: Cardiovascular;  Laterality: N/A;  . ESOPHAGOGASTRODUODENOSCOPY N/A 08/27/2017   normal esophagus s/p dilation, gastric petechia, small hiatal hernia, normal duodenum  . ESOPHAGOGASTRODUODENOSCOPY (EGD)  WITH PROPOFOL N/A 12/15/2018   severe esophagitis likely radiation-induced, medium-sized hiatal hernia, normal duodenum  . INSERT / REPLACE / REMOVE PACEMAKER    . MALONEY DILATION N/A 08/27/2017   Procedure: [name] DILATION;  Surgeon: [name], MD;  [facility];  Service: Endoscopy;  Laterality: N/A;  . PORTACATH PLACEMENT Right 11/18/2018   Procedure: INSERTION PORT-A-CATH;  Surgeon: [name], MD;  Location: [facility];  Service: General;  Laterality: Right;  .  RIGHT/LEFT HEART CATH AND CORONARY ANGIOGRAPHY N/A 10/21/2017   Procedure: RIGHT/LEFT HEART CATH AND CORONARY ANGIOGRAPHY;  Surgeon: [name], MD;  Location: [facility];  Service: Cardiovascular;  Laterality: N/A;     SOCIAL HISTORY:  Social History   Socioeconomic History  . Marital status: Married    Spouse name: Air traffic controller  . Number of children: 3  . Years of education: Not on file  . Highest education level: Not on file  Occupational History  . Occupation: retired    Comment: Press photographer  Tobacco Use  . Smoking status: Former Smoker    Packs/day: 2.50    Years: 25.00    Pack years: 62.50    Types: Cigarettes    Quit date: 06/14/1973    Years since quitting: 45.9  . Smokeless tobacco: Former Systems developer    Types: Chew    Quit date: 06/14/1973  . Tobacco comment: some/chewed very little for about 2 years  Substance and Sexual Activity  . Alcohol use: No  . Drug use: No  . Sexual activity: Not on file  Other Topics Concern  . 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    Social Determinants of Health   Financial Resource Strain: Low Risk   . Difficulty of Paying Living Expenses: Not very hard  Food Insecurity: No Food Insecurity  . Worried About Charity fundraiser in the Last Year: Never true  . Ran Out of Food in the Last Year: Never true  Transportation Needs: No Transportation Needs  . Lack of Transportation (Medical): No  . Lack of Transportation (Non-Medical): No  Physical Activity: Inactive  . Days of Exercise per Week: 0 days  . Minutes of Exercise per Session: 0 min  Stress: No Stress Concern Present  . Feeling of Stress : Not at all  Social Connections: Slightly Isolated  . Frequency of Communication with Friends and Family: More than three times a week  . Frequency of Social Gatherings with Friends and Family: More than three times a week  . Attends Religious Services: More than 4 times per year  . Active Member  of Clubs or Organizations: No  . Attends Archivist Meetings: Never  . Marital Status: Married  Human resources officer Violence: Not At Risk  . Fear of Current or Ex-Partner: No  . Emotionally Abused: No  . Physically Abused: No  . Sexually Abused: No    FAMILY HISTORY:  Family History  Problem Relation Age of Onset  . Emphysema Mother        mother died with emphysema and cancer  . Cancer Mother   . Prostate cancer Brother   . Heart disease Brother   . Hypothyroidism Daughter   . Hypothyroidism Daughter   . Colon cancer Neg Hx   . Colon polyps Neg Hx     CURRENT MEDICATIONS:  Outpatient Encounter Medications as of 05/18/2019  Medication Sig  . aspirin EC 81 MG tablet Take 81  mg by mouth daily.  . dorzolamide-timolol (COSOPT) 22.3-6.8 MG/ML ophthalmic solution Place 1 drop into the left eye 2 (two) times daily.  . hydrochlorothiazide (HYDRODIURIL) 25 MG tablet TAKE ONE (1) TABLET BY MOUTH EVERY DAY  . magnesium oxide (MAG-OX) 400 (241.3 Mg) MG tablet Take 1 tablet (400 mg total) by mouth 2 (two) times daily.  . metoprolol succinate (TOPROL-XL) 50 MG 24 hr tablet TAKE ONE (1) TABLET BY MOUTH EVERY DAY  . pantoprazole (PROTONIX) 40 MG tablet Take 1 tablet (40 mg total) by mouth daily before breakfast.  . potassium chloride 20 MEQ/15ML (10%) SOLN Take 15 mLs (20 mEq total) by mouth daily.  . tamsulosin (FLOMAX) 0.4 MG CAPS capsule Take 0.4 mg by mouth daily.  . vitamin B-12 (CYANOCOBALAMIN) 1000 MCG tablet Take 1,000 mcg by mouth daily.  [name] acetaminophen (TYLENOL) 500 MG tablet Take 500 mg by mouth every 6 (six) hours as needed for pain.  . cholecalciferol (VITAMIN D) 1000 units tablet Take 1,000 Units by mouth 2 (two) times daily.  . nitroGLYCERIN (NITROSTAT) 0.4 MG SL tablet Place 0.4 mg under the tongue every 5 (five) minutes as needed for chest pain.  [name] nystatin-triamcinolone ointment (MYCOLOG) Apply 1 application topically 2 (two) times daily.  . sucralfate (CARAFATE) 1  GM/10ML suspension Take 10 mLs (1 g total) by mouth 4 (four) times daily -  with meals and at bedtime. (Patient not taking: Reported on 05/18/2019)   No facility-administered encounter medications on file as of 05/18/2019.    ALLERGIES:  Allergies  Allergen Reactions  . Ace Inhibitors Nausea Only  . Tape Rash    And Bandaids, too     PHYSICAL EXAM:  ECOG Performance status: 1  There were no vitals filed for this visit. There were no vitals filed for this visit.  Physical Exam Vitals reviewed.  Constitutional:      Appearance: Normal appearance.  Cardiovascular:     Rate and Rhythm: Normal rate and regular rhythm.     Heart sounds: Normal heart sounds.  Pulmonary:     Effort: Pulmonary effort is normal.     Breath sounds: Normal breath sounds.  Abdominal:     General: There is no distension.     Palpations: Abdomen is soft. There is no mass.  Musculoskeletal:        General: No swelling.  Skin:    General: Skin is warm.  Neurological:     General: No focal deficit present.     Mental Status: He is alert and oriented to person, place, and time.  Psychiatric:        Mood and Affect: Mood normal.        Behavior: Behavior normal.    There is mild erythema around umbilicus.  LABORATORY DATA:  I have reviewed the labs as listed.  CBC    Component Value Date/Time   WBC 7.9 05/18/2019 0804   RBC 3.53 (L) 05/18/2019 0804   HGB 11.1 (L) 05/18/2019 0804   HCT 36.0 (L) 05/18/2019 0804   PLT 296 05/18/2019 0804   MCV 102.0 (H) 05/18/2019 0804   MCH 31.4 05/18/2019 0804   MCHC 30.8 05/18/2019 0804   RDW 14.8 05/18/2019 0804   LYMPHSABS 0.4 (L) 05/18/2019 0804   MONOABS 0.6 05/18/2019 0804   EOSABS 0.1 05/18/2019 0804   BASOSABS 0.0 05/18/2019 0804   CMP Latest Ref Rng & Units 05/18/2019 05/04/2019 04/19/2019  Glucose 70 - 99 mg/dL 131(H) 138(H) 107(H)  BUN 8 - 23  mg/dL 19 20 16   Creatinine 0.61 - 1.24 mg/dL 0.94 0.86 0.77  Sodium 135 - 145 mmol/L 136 136 134(L)    Potassium 3.5 - 5.1 mmol/L 3.8 3.3(L) 3.4(L)  Chloride 98 - 111 mmol/L 99 98 98  CO2 22 - 32 mmol/L 27 29 30   Calcium 8.9 - 10.3 mg/dL 9.7 9.2 9.0  Total Protein 6.5 - 8.1 g/dL 7.4 7.0 6.8  Total Bilirubin 0.3 - 1.2 mg/dL 0.8 0.9 0.7  Alkaline Phos 38 - 126 U/L 57 50 49  AST 15 - 41 U/L 14(L) 15 12(L)  ALT 0 - 44 U/L 9 11 12        DIAGNOSTIC IMAGING:  I have independently reviewed scans.    ASSESSMENT & PLAN:   Squamous cell carcinoma of lung ([place]) 1.  [name] squamous cell carcinoma left lung: -Weekly carboplatin/paclitaxel with radiation from 11/21/2018 through 12/05/2018.  Radiation discontinued on 12/09/2018 secondary to prolonged hospitalization from radiation esophagitis. -Durvalumab consolidation every 2 weeks started on 02/07/2019, restarted back on 04/21/2019 after holding on 02/21/2019 secondary to severe weight loss. -CT chest with contrast on 04/19/2019 showed persistent left lower lobe airspace consolidation stable from 03/22/2019 compatible with radiation therapy.  Small left pleural effusion stable.  Mild mediastinal adenopathy likely reactive.  Emphysema and interstitial lung disease stable.  No immunotherapy related pneumonitis. -I have reviewed his labs.  CBC and LFTs are normal.  He has some rash around the umbilicus.  I have given nystatin cream. -He will proceed with his treatment today.  I will reevaluate him in 2 weeks.  2.  Weight loss: -He lost about 3 pounds after gaining 5 pounds 2 weeks ago.  He reports that he is getting tired of eating same foods.  He is drinking 3 cans of equate per day. -I have suggested him to increase his equate to 5 cans/day.  He will also talk to our nutritionist this Friday.  3.  Hypomagnesemia: -Magnesium today is 1.8.  He will continue magnesium 400 mg twice daily.  4.  Hypokalemia: -Potassium today is 3.8.  He will continue potassium 20 mEq daily at home.  5.  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  Pacemaker was placed in  November 2018. -Plavix on hold since he had hemoptysis.  He will continue aspirin daily.   Orders placed this encounter:  No orders of the defined types were placed in this encounter.    Derek Jack, MD [place] [phone]

## 2019-05-18 NOTE — Progress Notes (Signed)
Patient has been assessed, vital signs and labs have been reviewed by Dr. Katragadda. ANC, Creatinine, LFTs, and Platelets are within treatment parameters per Dr. Katragadda. The patient is good to proceed with treatment at this time.  

## 2019-05-18 NOTE — Assessment & Plan Note (Addendum)
1.  [name] squamous cell carcinoma left lung: -Weekly carboplatin/paclitaxel with radiation from 11/21/2018 through 12/05/2018.  Radiation discontinued on 12/09/2018 secondary to prolonged hospitalization from radiation esophagitis. -Durvalumab consolidation every 2 weeks started on 02/07/2019, restarted back on 04/21/2019 after holding on 02/21/2019 secondary to severe weight loss. -CT chest with contrast on 04/19/2019 showed persistent left lower lobe airspace consolidation stable from 03/22/2019 compatible with radiation therapy.  Small left pleural effusion stable.  Mild mediastinal adenopathy likely reactive.  Emphysema and interstitial lung disease stable.  No immunotherapy related pneumonitis. -I have reviewed his labs.  CBC and LFTs are normal.  He has some rash around the umbilicus.  I have given nystatin cream. -He will proceed with his treatment today.  I will reevaluate him in 2 weeks.  2.  Weight loss: -He lost about 3 pounds after gaining 5 pounds 2 weeks ago.  He reports that he is getting tired of eating same foods.  He is drinking 3 cans of equate per day. -I have suggested him to increase his equate to 5 cans/day.  He will also talk to our nutritionist this Friday.  3.  Hypomagnesemia: -Magnesium today is 1.8.  He will continue magnesium 400 mg twice daily.  4.  Hypokalemia: -Potassium today is 3.8.  He will continue potassium 20 mEq daily at home.  5.  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  Pacemaker was placed in November 2018. -Plavix on hold since he had hemoptysis.  He will continue aspirin daily.

## 2019-05-18 NOTE — Progress Notes (Signed)
9774- Lab results and vital signs reviewed and patient seen by Dr. Delton Coombes, who approved patient for treatment today. Pt reports no changes since last visit. He states his appetite is good. His only complaint is still ringing in his ears which causes him to have about 2 headaches per day.   Imfinzi infusion tolerated without incident. VSS prior to and after administration. Port flushed and deaccessed. Pt discharged in satisfactory condition with follow up instructions.

## 2019-05-18 NOTE — Patient Instructions (Signed)
[place] at [facility] Discharge Instructions  You were seen today by Dr. Delton Coombes. He went over your recent lab results. He will send in a new prescription for nystatin ointment for your belly button. Increase your Ensure to 4-5 a day. He will see you back in 2 weeks for labs, treatment and follow up.   Thank you for choosing [place] at [facility] to provide your oncology and hematology care.  To afford each patient quality time with our provider, please arrive at least 15 minutes before your scheduled appointment time.   If you have a lab appointment with the [facility] please come in thru the  Main Entrance and check in at the main information desk  You need to re-schedule your appointment should you arrive 10 or more minutes late.  We strive to give you quality time with our providers, and arriving late affects you and other patients whose appointments are after yours.  Also, if you no show three or more times for appointments you may be dismissed from the clinic at the providers discretion.     Again, thank you for choosing [facility].  Our hope is that these requests will decrease the amount of time that you wait before being seen by our physicians.       _____________________________________________________________  Should you have questions after your visit to [facility], please contact our office at [phone] between the hours of 8:00 a.m. and 4:30 p.m.  Voicemails left after 4:00 p.m. will not be returned until the following business day.  For prescription refill requests, have your pharmacy contact our office and allow 72 hours.    Cancer Center Support Programs:   > Cancer Support Group  2nd Tuesday of the month 1pm-2pm, Journey Room

## 2019-05-22 ENCOUNTER — Encounter: Payer: Self-pay | Admitting: Family Medicine

## 2019-05-22 ENCOUNTER — Ambulatory Visit (INDEPENDENT_AMBULATORY_CARE_PROVIDER_SITE_OTHER): Payer: No Typology Code available for payment source | Admitting: Family Medicine

## 2019-05-22 ENCOUNTER — Other Ambulatory Visit: Payer: Self-pay

## 2019-05-22 VITALS — BP 141/68 | HR 80 | Temp 98.0°F | Ht 69.0 in | Wt 158.2 lb

## 2019-05-22 DIAGNOSIS — H409 Unspecified glaucoma: Secondary | ICD-10-CM

## 2019-05-22 DIAGNOSIS — I5022 Chronic systolic (congestive) heart failure: Secondary | ICD-10-CM | POA: Diagnosis not present

## 2019-05-22 DIAGNOSIS — R634 Abnormal weight loss: Secondary | ICD-10-CM | POA: Diagnosis not present

## 2019-05-22 DIAGNOSIS — I2510 Atherosclerotic heart disease of native coronary artery without angina pectoris: Secondary | ICD-10-CM

## 2019-05-22 NOTE — Patient Instructions (Addendum)
Continue follow up as per oncology   If you have lab work done today you will be contacted with your lab results within the next 2 weeks.  If you have not heard from [place] then please contact us. The fastest way to get your results is to register for My Chart.   IF you received an x-ray today, you will receive an invoice from [facility]. Please contact [facility] at [phone] with questions or concerns regarding your invoice.   IF you received labwork today, you will receive an invoice from [facility]. Please contact [facility] at [phone] with questions or concerns regarding your invoice.   Our billing staff will not be able to assist you with questions regarding bills from these companies.  You will be contacted with the lab results as soon as they are available. The fastest way to get your results is to activate your My Chart account. Instructions are located on the last page of this paperwork. If you have not heard from [place] regarding the results in 2 weeks, please contact this office.

## 2019-05-22 NOTE — Progress Notes (Signed)
New Patient Office Visit  Subjective:  Patient ID: Nicholas Sanchez, male    DOB: Jul 27, 1933  Age: 86 y.o. MRN: [id number]  CC:  Chief Complaint  Patient presents with  . Follow-up    3 month f/u    HPI [name] presents for GERD-protonix HTN-HCTZ/Metoprolol-daily CAD-no recent use of nitro, 2019 MI-CHF Lung CA-oncology q 2week treatment-pt with weight loss-radiation/chemo B12 def-taking B12 daily Low K and Low Mg-monitoring at oncology  Past Medical History:  Diagnosis Date  . Atherosclerotic heart disease of native coronary artery without angina pectoris   . Bradycardia, unspecified   . CAD (coronary artery disease)    a. STEMI 04/2008 s/p BMS to prox & distal RCA, staged DES to LAD same admission. b. Nuc 03/2017: scar but no ischemia, EF 45-54%. c. 09/2017: NSTEMI - DES x 2 to proximal and mid-RCA  . Cancer ([place])   . Chronic anticoagulation   . Chronic atrial fibrillation ([place])   . Chronic systolic (congestive) heart failure ([place])   . CKD (chronic kidney disease), stage II   . Cough, persistent   . GERD (gastroesophageal reflux disease)   . History of BPH   . HTN (hypertension)   . Hypercholesteremia   . LV dysfunction    a. EF 45-50% in 01/2016.  . Malignant neoplasm of unspecified part of unspecified bronchus or lung ([place])   . Mild pulmonary hypertension ([place])   . Obesity, unspecified   . Other esophagitis without bleeding   . PAF (paroxysmal atrial fibrillation) ([place])   . Pneumonia, unspecified organism   . Port-A-Cath in place 11/21/2018  . Presence of cardiac pacemaker   . Presence of permanent cardiac pacemaker   . Sinus node dysfunction ([place])    a. s/p StJude CRT-pacemaker 01/2016.  [name] Tinnitus, bilateral   . Unspecified glaucoma     Past Surgical History:  Procedure Laterality Date  . BIOPSY  12/15/2018   Procedure: BIOPSY;  Surgeon: [name], MD;  [facility];  Service: Endoscopy;;  esophageal  . CARDIAC CATHETERIZATION     . COLONOSCOPY    . COLONOSCOPY N/A 08/27/2017   Dr. [name]: 25 5-25 mm polyps in descending colon, ascending, cecum. 2X3 cm carpet polyp in base of cecum lifted away s/p piecemeal polypectomy. Tubulovillous adenoma and tubular adenomas  . CORONARY STENT INTERVENTION N/A 10/22/2017   Procedure: CORONARY STENT INTERVENTION;  Surgeon: [name], MD;  Location: [place] [facility];  Service: Cardiovascular;  Laterality: N/A;  . CORONARY STENT PLACEMENT  04/2008   RCA and LAD  . EP IMPLANTABLE DEVICE N/A 01/09/2016   Procedure: BiV Pacemaker Insertion CRT-P;  Surgeon: [name], MD;  Location: [place] [facility];  Service: Cardiovascular;  Laterality: N/A;  . ESOPHAGOGASTRODUODENOSCOPY N/A 08/27/2017   normal esophagus s/p dilation, gastric petechia, small hiatal hernia, normal duodenum  . ESOPHAGOGASTRODUODENOSCOPY (EGD) WITH PROPOFOL N/A 12/15/2018   severe esophagitis likely radiation-induced, medium-sized hiatal hernia, normal duodenum  . INSERT / REPLACE / REMOVE PACEMAKER    . MALONEY DILATION N/A 08/27/2017   Procedure: [name] DILATION;  Surgeon: [name], MD;  [facility];  Service: Endoscopy;  Laterality: N/A;  . PORTACATH PLACEMENT Right 11/18/2018   Procedure: INSERTION PORT-A-CATH;  Surgeon: [name], MD;  Location: [facility];  Service: General;  Laterality: Right;  . RIGHT/LEFT HEART CATH AND CORONARY ANGIOGRAPHY N/A 10/21/2017   Procedure: RIGHT/LEFT HEART CATH AND CORONARY ANGIOGRAPHY;  Surgeon: [name], MD;  Location: [place] [facility];  Service: Cardiovascular;  Laterality: N/A;    Family History  Problem Relation Age of Onset  . Emphysema Mother        mother died with emphysema and cancer  . Cancer Mother   . Prostate cancer Brother   . Heart disease Brother   . Hypothyroidism Daughter   . Hypothyroidism Daughter   . Colon cancer Neg Hx   . Colon polyps Neg Hx     Social History   Socioeconomic History  . Marital status: Married     Spouse name: Air traffic controller  . Number of children: 3  . Years of education: Not on file  . Highest education level: Not on file  Occupational History  . Occupation: retired    Comment: Press photographer  Tobacco Use  . Smoking status: Former Smoker    Packs/day: 2.50    Years: 25.00    Pack years: 62.50    Types: Cigarettes    Quit date: 06/14/1973    Years since quitting: 45.9  . Smokeless tobacco: Former Systems developer    Types: Chew    Quit date: 06/14/1973  . Tobacco comment: some/chewed very little for about 2 years  Substance and Sexual Activity  . Alcohol use: No  . Drug use: No  . Sexual activity: Not on file  Other Topics Concern  . 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    Social Determinants of Health   Financial Resource Strain: Low Risk   . Difficulty of Paying Living Expenses: Not very hard  Food Insecurity: No Food Insecurity  . Worried About Charity fundraiser in the Last Year: Never true  . Ran Out of Food in the Last Year: Never true  Transportation Needs: No Transportation Needs  . Lack of Transportation (Medical): No  . Lack of Transportation (Non-Medical): No  Physical Activity: Inactive  . Days of Exercise per Week: 0 days  . Minutes of Exercise per Session: 0 min  Stress: No Stress Concern Present  . Feeling of Stress : Not at all  Social Connections: Slightly Isolated  . Frequency of Communication with Friends and Family: More than three times a week  . Frequency of Social Gatherings with Friends and Family: More than three times a week  . Attends Religious Services: More than 4 times per year  . Active Member of Clubs or Organizations: No  . Attends Archivist Meetings: Never  . Marital Status: Married  Human resources officer Violence: Not At Risk  . Fear of Current or Ex-Partner: No  . Emotionally Abused: No  . Physically Abused: No  . Sexually Abused: No    ROS Review of Systems  Constitutional: Negative.    Eyes:       Glaucoma  Respiratory: Positive for cough.        No oxygen  Cardiovascular: Negative.        Cardiology appt q 84months-08/2019 Pacemaker checks  Gastrointestinal:       Protonix -GERD  Musculoskeletal: Negative.   Allergic/Immunologic: Negative.   Neurological: Negative.   Psychiatric/Behavioral: Negative.     Objective:   Today's Vitals: BP (!) 141/68 (BP Location: Left Arm, Patient Position: Sitting, Cuff Size: Normal)   Pulse 80   Temp 98 F (36.7 C) (Temporal)   Ht 5\' 9"  (1.753 m)   Wt 158 lb 3.2 oz (71.8 kg)   SpO2 96%   BMI 23.36 kg/m  Physical Exam Constitutional:      Appearance: Normal appearance.  Cardiovascular:     Rate and Rhythm: Normal rate and regular rhythm.     Pulses: Normal pulses.     Heart sounds: Normal heart sounds.  Pulmonary:     Effort: Pulmonary effort is normal.     Breath sounds: Normal breath sounds.  Musculoskeletal:     Cervical back: Normal range of motion and neck supple.  Neurological:     Mental Status: He is alert and oriented to person, place, and time.  Psychiatric:        Mood and Affect: Mood normal.        Behavior: Behavior normal.       Outpatient Encounter Medications as of 05/22/2019  Medication Sig  . acetaminophen (TYLENOL) 500 MG tablet Take 500 mg by mouth every 6 (six) hours as needed for pain.  [name] aspirin EC 81 MG tablet Take 81 mg by mouth daily.  . dorzolamide-timolol (COSOPT) 22.3-6.8 MG/ML ophthalmic solution Place 1 drop into the left eye 2 (two) times daily.  . hydrochlorothiazide (HYDRODIURIL) 25 MG tablet TAKE ONE (1) TABLET BY MOUTH EVERY DAY  . magnesium oxide (MAG-OX) 400 (241.3 Mg) MG tablet Take 1 tablet (400 mg total) by mouth 2 (two) times daily.  . metoprolol succinate (TOPROL-XL) 50 MG 24 hr tablet TAKE ONE (1) TABLET BY MOUTH EVERY DAY  . nitroGLYCERIN (NITROSTAT) 0.4 MG SL tablet Place 0.4 mg under the tongue every 5 (five) minutes as needed for chest pain.  [name]  nystatin-triamcinolone ointment (MYCOLOG) Apply 1 application topically 2 (two) times daily.  . pantoprazole (PROTONIX) 40 MG tablet Take 1 tablet (40 mg total) by mouth daily before breakfast.  . potassium chloride 20 MEQ/15ML (10%) SOLN Take 15 mLs (20 mEq total) by mouth daily.  . tamsulosin (FLOMAX) 0.4 MG CAPS capsule Take 0.4 mg by mouth daily.  . vitamin B-12 (CYANOCOBALAMIN) 1000 MCG tablet Take 1,000 mcg by mouth daily.  . [DISCONTINUED] cholecalciferol (VITAMIN D) 1000 units tablet Take 1,000 Units by mouth 2 (two) times daily.  . [DISCONTINUED] sucralfate (CARAFATE) 1 GM/10ML suspension Take 10 mLs (1 g total) by mouth 4 (four) times daily -  with meals and at bedtime. (Patient not taking: Reported on 05/18/2019)   No facility-administered encounter medications on file as of 05/22/2019.  1. Atherosclerosis of native coronary artery without angina pectoris, unspecified whether native or transplanted heart Pt not taking nitro for CP  2. Chronic systolic (congestive) heart failure (HCC) No recent admission for CAD  3. Glaucoma, unspecified glaucoma type, unspecified laterality Eye specialist -continue taking drops  4. Loss of weight Oncology following-protein shakes  Follow-up: 6 months-recheck cardio-keep appointment for follow up  Nekhi Liwanag Hannah Beat, MD

## 2019-05-28 NOTE — Progress Notes (Signed)

## 2019-06-01 ENCOUNTER — Other Ambulatory Visit: Payer: Self-pay

## 2019-06-01 ENCOUNTER — Inpatient Hospital Stay (HOSPITAL_COMMUNITY): Payer: Medicare Other

## 2019-06-01 ENCOUNTER — Encounter (HOSPITAL_COMMUNITY): Payer: Self-pay | Admitting: Hematology

## 2019-06-01 ENCOUNTER — Inpatient Hospital Stay (HOSPITAL_COMMUNITY): Payer: Medicare Other | Attending: Hematology | Admitting: Hematology

## 2019-06-01 VITALS — BP 106/54 | HR 63 | Temp 98.2°F | Resp 17 | Wt 156.2 lb

## 2019-06-01 DIAGNOSIS — R634 Abnormal weight loss: Secondary | ICD-10-CM | POA: Insufficient documentation

## 2019-06-01 DIAGNOSIS — Z923 Personal history of irradiation: Secondary | ICD-10-CM | POA: Insufficient documentation

## 2019-06-01 DIAGNOSIS — C3432 Malignant neoplasm of lower lobe, left bronchus or lung: Secondary | ICD-10-CM | POA: Diagnosis not present

## 2019-06-01 DIAGNOSIS — Z5112 Encounter for antineoplastic immunotherapy: Secondary | ICD-10-CM | POA: Insufficient documentation

## 2019-06-01 DIAGNOSIS — Z79899 Other long term (current) drug therapy: Secondary | ICD-10-CM | POA: Diagnosis not present

## 2019-06-01 DIAGNOSIS — C3490 Malignant neoplasm of unspecified part of unspecified bronchus or lung: Secondary | ICD-10-CM

## 2019-06-01 DIAGNOSIS — Z9221 Personal history of antineoplastic chemotherapy: Secondary | ICD-10-CM | POA: Diagnosis not present

## 2019-06-01 DIAGNOSIS — E876 Hypokalemia: Secondary | ICD-10-CM | POA: Insufficient documentation

## 2019-06-01 DIAGNOSIS — I252 Old myocardial infarction: Secondary | ICD-10-CM | POA: Diagnosis not present

## 2019-06-01 DIAGNOSIS — I2510 Atherosclerotic heart disease of native coronary artery without angina pectoris: Secondary | ICD-10-CM | POA: Diagnosis not present

## 2019-06-01 DIAGNOSIS — Z95828 Presence of other vascular implants and grafts: Secondary | ICD-10-CM

## 2019-06-01 DIAGNOSIS — Z950 Presence of cardiac pacemaker: Secondary | ICD-10-CM | POA: Diagnosis not present

## 2019-06-01 LAB — CBC WITH DIFFERENTIAL/PLATELET
Abs Immature Granulocytes: 0.03 10*3/uL (ref 0.00–0.07)
Basophils Absolute: 0 10*3/uL (ref 0.0–0.1)
Basophils Relative: 0 %
Eosinophils Absolute: 0.1 10*3/uL (ref 0.0–0.5)
Eosinophils Relative: 1 %
HCT: 34.5 % — ABNORMAL LOW (ref 39.0–52.0)
Hemoglobin: 10.6 g/dL — ABNORMAL LOW (ref 13.0–17.0)
Immature Granulocytes: 0 %
Lymphocytes Relative: 6 %
Lymphs Abs: 0.4 10*3/uL — ABNORMAL LOW (ref 0.7–4.0)
MCH: 31.5 pg (ref 26.0–34.0)
MCHC: 30.7 g/dL (ref 30.0–36.0)
MCV: 102.7 fL — ABNORMAL HIGH (ref 80.0–100.0)
Monocytes Absolute: 0.5 10*3/uL (ref 0.1–1.0)
Monocytes Relative: 7 %
Neutro Abs: 6.4 10*3/uL (ref 1.7–7.7)
Neutrophils Relative %: 86 %
Platelets: 251 10*3/uL (ref 150–400)
RBC: 3.36 MIL/uL — ABNORMAL LOW (ref 4.22–5.81)
RDW: 15 % (ref 11.5–15.5)
WBC: 7.5 10*3/uL (ref 4.0–10.5)
nRBC: 0 % (ref 0.0–0.2)

## 2019-06-01 LAB — COMPREHENSIVE METABOLIC PANEL
ALT: 9 U/L (ref 0–44)
AST: 12 U/L — ABNORMAL LOW (ref 15–41)
Albumin: 3.6 g/dL (ref 3.5–5.0)
Alkaline Phosphatase: 53 U/L (ref 38–126)
Anion gap: 10 (ref 5–15)
BUN: 16 mg/dL (ref 8–23)
CO2: 28 mmol/L (ref 22–32)
Calcium: 9.4 mg/dL (ref 8.9–10.3)
Chloride: 96 mmol/L — ABNORMAL LOW (ref 98–111)
Creatinine, Ser: 0.92 mg/dL (ref 0.61–1.24)
GFR calc Af Amer: >60 mL/min (ref >60)
GFR calc non Af Amer: >60 mL/min (ref >60)
Glucose, Bld: 113 mg/dL — ABNORMAL HIGH (ref 70–99)
Potassium: 3.8 mmol/L (ref 3.5–5.1)
Sodium: 134 mmol/L — ABNORMAL LOW (ref 135–145)
Total Bilirubin: 0.9 mg/dL (ref 0.3–1.2)
Total Protein: 7 g/dL (ref 6.5–8.1)

## 2019-06-01 LAB — MAGNESIUM: Magnesium: 1.8 mg/dL (ref 1.7–2.4)

## 2019-06-01 MED ORDER — SODIUM CHLORIDE 0.9 % IV SOLN: Freq: Once | INTRAVENOUS | Status: AC

## 2019-06-01 MED ORDER — SODIUM CHLORIDE 0.9 % IV SOLN
10.0000 mg/kg | Freq: Once | INTRAVENOUS | Status: AC
  Administered 2019-06-01: 740 mg via INTRAVENOUS
  Filled 2019-06-01: qty 4.8

## 2019-06-01 MED ORDER — POTASSIUM CHLORIDE 20 MEQ/15ML (10%) PO SOLN: 20.0000 meq | Freq: Every day | ORAL | 2 refills | Status: AC

## 2019-06-01 MED ORDER — NYSTATIN 100000 UNIT/ML MT SUSP: 5.0000 mL | Freq: Three times a day (TID) | OROMUCOSAL | 1 refills | Status: AC

## 2019-06-01 MED ORDER — SODIUM CHLORIDE 0.9% FLUSH
10.0000 mL | INTRAVENOUS | Status: DC | PRN
  Administered 2019-06-01: 10 mL

## 2019-06-01 MED ORDER — HEPARIN SOD (PORK) LOCK FLUSH 100 UNIT/ML IV SOLN
500.0000 [IU] | Freq: Once | INTRAVENOUS | Status: AC | PRN
  Administered 2019-06-01: 500 [IU]

## 2019-06-01 NOTE — Patient Instructions (Signed)
[facility] Discharge Instructions for Patients Receiving Chemotherapy  Today you received the following chemotherapy agents   To help prevent nausea and vomiting after your treatment, we encourage you to take your nausea medication   If you develop nausea and vomiting that is not controlled by your nausea medication, call the clinic.   BELOW ARE SYMPTOMS THAT SHOULD BE REPORTED IMMEDIATELY:  *FEVER GREATER THAN 100.5 F  *CHILLS WITH OR WITHOUT FEVER  NAUSEA AND VOMITING THAT IS NOT CONTROLLED WITH YOUR NAUSEA MEDICATION  *UNUSUAL SHORTNESS OF BREATH  *UNUSUAL BRUISING OR BLEEDING  TENDERNESS IN MOUTH AND THROAT WITH OR WITHOUT PRESENCE OF ULCERS  *URINARY PROBLEMS  *BOWEL PROBLEMS  UNUSUAL RASH Items with * indicate a potential emergency and should be followed up as soon as possible.  Feel free to call the clinic should you have any questions or concerns. The clinic phone number is [phone].  Please show the CHEMO ALERT CARD at check-in to the Emergency Department and triage nurse.   

## 2019-06-01 NOTE — Progress Notes (Signed)
Patient has been assessed, vital signs and labs have been reviewed by Dr. Katragadda. ANC, Creatinine, LFTs, and Platelets are within treatment parameters per Dr. Katragadda. The patient is good to proceed with treatment at this time.  

## 2019-06-01 NOTE — Patient Instructions (Signed)
[place] [facility] Discharge Instructions  You were seen today by Dr. Delton Coombes. He went over your recent lab results. Please increase your Equate to 5 cans a day. He will refill your potassium and sent in a new prescription for Nystatin as well. He will see you back in 2 weeks for labs, treatment and follow up.   Thank you for choosing [place] at [facility] to provide your oncology and hematology care.  To afford each patient quality time with our provider, please arrive at least 15 minutes before your scheduled appointment time.   If you have a lab appointment with the [facility] please come in thru the  Main Entrance and check in at the main information desk  You need to re-schedule your appointment should you arrive 10 or more minutes late.  We strive to give you quality time with our providers, and arriving late affects you and other patients whose appointments are after yours.  Also, if you no show three or more times for appointments you may be dismissed from the clinic at the providers discretion.     Again, thank you for choosing [facility].  Our hope is that these requests will decrease the amount of time that you wait before being seen by our physicians.       _____________________________________________________________  Should you have questions after your visit to [facility], please contact our office at [phone] between the hours of 8:00 a.m. and 4:30 p.m.  Voicemails left after 4:00 p.m. will not be returned until the following business day.  For prescription refill requests, have your pharmacy contact our office and allow 72 hours.    Cancer Center Support Programs:   > Cancer Support Group  2nd Tuesday of the month 1pm-2pm, Journey Room

## 2019-06-01 NOTE — Progress Notes (Signed)
Nicholas Sanchez, Nicholas Sanchez [id number]   CLINIC:  Medical Oncology/Hematology  PCP:  Nicholas Sanchez, [place] [id number] [phone]   REASON FOR VISIT:  Follow-up for lung cancer.  CURRENT THERAPY: Consolidation immunotherapy.   CANCER STAGING: Cancer Staging Squamous cell carcinoma of lung (HCC) Staging form: Lung, AJCC 8th Edition - Clinical stage from 11/09/2018: Stage IIIB (cT1c, cN3, cM0) - Unsigned    INTERVAL HISTORY:  Nicholas Sanchez 86 y.o. male seen for follow-up of lung cancer.  He lost 1 pound since last visit.  Appetite is 25%.  Energy levels are 50%.  Shortness of breath on exertion and chronic cough has been stable.  He feels dizzy at times and has occasional headaches.  These are also stable.  He is drinking up to 3 cans of equate plus.   REVIEW OF SYSTEMS:  Review of Systems  Respiratory: Positive for cough and shortness of breath.   Neurological: Positive for dizziness and headaches.  All other systems reviewed and are negative.    PAST MEDICAL/SURGICAL HISTORY:  Past Medical History:  Diagnosis Date  . Atherosclerotic heart disease of native coronary artery without angina pectoris   . Bradycardia, unspecified   . CAD (coronary artery disease)    a. STEMI 04/2008 s/p BMS to prox & distal RCA, staged DES to LAD same admission. b. Nuc 03/2017: scar but no ischemia, EF 45-54%. c. 09/2017: NSTEMI - DES x 2 to proximal and mid-RCA  . Cancer ([place])   . Chronic anticoagulation   . Chronic atrial fibrillation ([place])   . Chronic systolic (congestive) heart failure ([place])   . CKD (chronic kidney disease), stage II   . Cough, persistent   . GERD (gastroesophageal reflux disease)   . History of BPH   . HTN (hypertension)   . Hypercholesteremia   . LV dysfunction    a. EF 45-50% in 01/2016.  . Malignant neoplasm of unspecified part of unspecified bronchus or lung ([place])   . Mild pulmonary hypertension ([place])   .  Obesity, unspecified   . Other esophagitis without bleeding   . PAF (paroxysmal atrial fibrillation) ([place])   . Pneumonia, unspecified organism   . Port-A-Cath in place 11/21/2018  . Presence of cardiac pacemaker   . Presence of permanent cardiac pacemaker   . Sinus node dysfunction ([place])    a. s/p StJude CRT-pacemaker 01/2016.  [name] Tinnitus, bilateral   . Unspecified glaucoma    Past Surgical History:  Procedure Laterality Date  . BIOPSY  12/15/2018   Procedure: BIOPSY;  Surgeon: [name], MD;  [facility];  Service: Endoscopy;;  esophageal  . CARDIAC CATHETERIZATION    . COLONOSCOPY    . COLONOSCOPY N/A 08/27/2017   Dr. [name]: 25 5-25 mm polyps in descending colon, ascending, cecum. 2X3 cm carpet polyp in base of cecum lifted away s/p piecemeal polypectomy. Tubulovillous adenoma and tubular adenomas  . CORONARY STENT INTERVENTION N/A 10/22/2017   Procedure: CORONARY STENT INTERVENTION;  Surgeon: [name], MD;  Location: [place] [facility];  Service: Cardiovascular;  Laterality: N/A;  . CORONARY STENT PLACEMENT  04/2008   RCA and LAD  . EP IMPLANTABLE DEVICE N/A 01/09/2016   Procedure: BiV Pacemaker Insertion CRT-P;  Surgeon: [name], MD;  Location: [place] [facility];  Service: Cardiovascular;  Laterality: N/A;  . ESOPHAGOGASTRODUODENOSCOPY N/A 08/27/2017   normal esophagus s/p dilation, gastric petechia, small hiatal hernia, normal duodenum  .  ESOPHAGOGASTRODUODENOSCOPY (EGD) WITH PROPOFOL N/A 12/15/2018   severe esophagitis likely radiation-induced, medium-sized hiatal hernia, normal duodenum  . INSERT / REPLACE / REMOVE PACEMAKER    . MALONEY DILATION N/A 08/27/2017   Procedure: [name] DILATION;  Surgeon: [name], MD;  [facility];  Service: Endoscopy;  Laterality: N/A;  . PORTACATH PLACEMENT Right 11/18/2018   Procedure: INSERTION PORT-A-CATH;  Surgeon: [name], MD;  Location: [facility];  Service: General;  Laterality: Right;  .  RIGHT/LEFT HEART CATH AND CORONARY ANGIOGRAPHY N/A 10/21/2017   Procedure: RIGHT/LEFT HEART CATH AND CORONARY ANGIOGRAPHY;  Surgeon: [name], MD;  Location: [place] [facility];  Service: Cardiovascular;  Laterality: N/A;     SOCIAL HISTORY:  Social History   Socioeconomic History  . Marital status: Married    Spouse name: Air traffic controller  . Number of children: 3  . Years of education: Not on file  . Highest education level: Not on file  Occupational History  . Occupation: retired    Comment: Press photographer  Tobacco Use  . Smoking status: Former Smoker    Packs/day: 2.50    Years: 25.00    Pack years: 62.50    Types: Cigarettes    Quit date: 06/14/1973    Years since quitting: 45.9  . Smokeless tobacco: Former Systems developer    Types: Chew    Quit date: 06/14/1973  . Tobacco comment: some/chewed very little for about 2 years  Substance and Sexual Activity  . Alcohol use: No  . Drug use: No  . Sexual activity: Not on file  Other Topics Concern  . 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    Social Determinants of Health   Financial Resource Strain: Low Risk   . Difficulty of Paying Living Expenses: Not very hard  Food Insecurity: No Food Insecurity  . Worried About Charity fundraiser in the Last Year: Never true  . Ran Out of Food in the Last Year: Never true  Transportation Needs: No Transportation Needs  . Lack of Transportation (Medical): No  . Lack of Transportation (Non-Medical): No  Physical Activity: Inactive  . Days of Exercise per Week: 0 days  . Minutes of Exercise per Session: 0 min  Stress: No Stress Concern Present  . Feeling of Stress : Not at all  Social Connections: Slightly Isolated  . Frequency of Communication with Friends and Family: More than three times a week  . Frequency of Social Gatherings with Friends and Family: More than three times a week  . Attends Religious Services: More than 4 times per year  . Active Member  of Clubs or Organizations: No  . Attends Archivist Meetings: Never  . Marital Status: Married  Human resources officer Violence: Not At Risk  . Fear of Current or Ex-Partner: No  . Emotionally Abused: No  . Physically Abused: No  . Sexually Abused: No    FAMILY HISTORY:  Family History  Problem Relation Age of Onset  . Emphysema Mother        mother died with emphysema and cancer  . Cancer Mother   . Prostate cancer Brother   . Heart disease Brother   . Hypothyroidism Daughter   . Hypothyroidism Daughter   . Colon cancer Neg Hx   . Colon polyps Neg Hx     CURRENT MEDICATIONS:  Outpatient Encounter Medications as of 06/01/2019  Medication Sig  . aspirin EC 81 MG tablet  Take 81 mg by mouth daily.  . dorzolamide-timolol (COSOPT) 22.3-6.8 MG/ML ophthalmic solution Place 1 drop into the left eye 2 (two) times daily.  . hydrochlorothiazide (HYDRODIURIL) 25 MG tablet TAKE ONE (1) TABLET BY MOUTH EVERY DAY  . magnesium oxide (MAG-OX) 400 (241.3 Mg) MG tablet Take 1 tablet (400 mg total) by mouth 2 (two) times daily.  . metoprolol succinate (TOPROL-XL) 50 MG 24 hr tablet TAKE ONE (1) TABLET BY MOUTH EVERY DAY  . nystatin-triamcinolone ointment (MYCOLOG) Apply 1 application topically 2 (two) times daily.  . pantoprazole (PROTONIX) 40 MG tablet Take 1 tablet (40 mg total) by mouth daily before breakfast.  . potassium chloride 20 MEQ/15ML (10%) SOLN Take 15 mLs (20 mEq total) by mouth daily.  . tamsulosin (FLOMAX) 0.4 MG CAPS capsule Take 0.4 mg by mouth daily.  . vitamin B-12 (CYANOCOBALAMIN) 1000 MCG tablet Take 1,000 mcg by mouth daily.  . [DISCONTINUED] potassium chloride 20 MEQ/15ML (10%) SOLN Take 15 mLs (20 mEq total) by mouth daily.  [name] acetaminophen (TYLENOL) 500 MG tablet Take 500 mg by mouth every 6 (six) hours as needed for pain.  [name] lidocaine (XYLOCAINE) 2 % solution Use as directed 15 mLs in the mouth or throat every 6 (six) hours as needed.   . nitroGLYCERIN (NITROSTAT) 0.4  MG SL tablet Place 0.4 mg under the tongue every 5 (five) minutes as needed for chest pain.  [name] nystatin (MYCOSTATIN) 100000 UNIT/ML suspension Take 5 mLs (500,000 Units total) by mouth 3 (three) times daily.   No facility-administered encounter medications on file as of 06/01/2019.    ALLERGIES:  Allergies  Allergen Reactions  . Ace Inhibitors Nausea Only  . Tape Rash    And Bandaids, too     PHYSICAL EXAM:  ECOG Performance status: 1  There were no vitals filed for this visit. There were no vitals filed for this visit.  Physical Exam Vitals reviewed.  Constitutional:      Appearance: Normal appearance.  Cardiovascular:     Rate and Rhythm: Normal rate and regular rhythm.     Heart sounds: Normal heart sounds.  Pulmonary:     Effort: Pulmonary effort is normal.     Breath sounds: Normal breath sounds.  Abdominal:     General: There is no distension.     Palpations: Abdomen is soft. There is no mass.  Musculoskeletal:        General: No swelling.  Skin:    General: Skin is warm.  Neurological:     General: No focal deficit present.     Mental Status: He is alert and oriented to person, place, and time.  Psychiatric:        Mood and Affect: Mood normal.        Behavior: Behavior normal.      LABORATORY DATA:  I have reviewed the labs as listed.  CBC    Component Value Date/Time   WBC 7.5 06/01/2019 0938   RBC 3.36 (L) 06/01/2019 0938   HGB 10.6 (L) 06/01/2019 0938   HCT 34.5 (L) 06/01/2019 0938   PLT 251 06/01/2019 0938   MCV 102.7 (H) 06/01/2019 0938   MCH 31.5 06/01/2019 0938   MCHC 30.7 06/01/2019 0938   RDW 15.0 06/01/2019 0938   LYMPHSABS 0.4 (L) 06/01/2019 0938   MONOABS 0.5 06/01/2019 0938   EOSABS 0.1 06/01/2019 0938   BASOSABS 0.0 06/01/2019 0938   CMP Latest Ref Rng & Units 06/01/2019 05/18/2019 05/04/2019  Glucose 70 - 99  mg/dL 113(H) 131(H) 138(H)  BUN 8 - 23 mg/dL 16 19 20   Creatinine 0.61 - 1.24 mg/dL 0.92 0.94 0.86  Sodium 135 - 145 mmol/L  134(L) 136 136  Potassium 3.5 - 5.1 mmol/L 3.8 3.8 3.3(L)  Chloride 98 - 111 mmol/L 96(L) 99 98  CO2 22 - 32 mmol/L 28 27 29   Calcium 8.9 - 10.3 mg/dL 9.4 9.7 9.2  Total Protein 6.5 - 8.1 g/dL 7.0 7.4 7.0  Total Bilirubin 0.3 - 1.2 mg/dL 0.9 0.8 0.9  Alkaline Phos 38 - 126 U/L 53 57 50  AST 15 - 41 U/L 12(L) 14(L) 15  ALT 0 - 44 U/L 9 9 11        DIAGNOSTIC IMAGING:  I have reviewed scans.    ASSESSMENT & PLAN:   Squamous cell carcinoma of lung ([place]) 1.  [name] squamous cell carcinoma left lung: -Weekly carboplatin and paclitaxel with radiation from 11/21/2018 through 12/05/2018. -Durvalumab consolidation every 2 weeks started on 02/07/2019, restarted back on 04/21/2019. -CT chest on 04/19/2019 showed persistent left lower lobe airspace consolidation stable from 03/22/2019 compatible with radiation therapy.  Small left pleural effusion.  Mild mediastinal adenopathy likely reactive.  Emphysema and interstitial lung disease stable.  No immunotherapy related pneumonitis. -I have reviewed his labs today.  White count, platelet count and LFTs are normal.  He will proceed with his immunotherapy today. -We will reevaluate him in 2 weeks.  I plan to check iron panel at next visit.  2.  Weight loss: -He lost about 1 pound since last visit 2 weeks ago. -I have encouraged him to improve his intake of he quit plus to 5 cans/day. -I have also given nystatin swish and swallow as he developed mild dysphagia.  3.  Hypomagnesemia: -Continue magnesium 3 times a day.  Magnesium today is 1.8.  4.  Hypokalemia: -Continue potassium 20 mEq daily at home.  Potassium today is 3.8.  5.  CAD: -MI x2 in 2010 and 2019.  Pacemaker placed in normal 2018. -Plavix on hold since he had hemoptysis.  He will continue aspirin.   Orders placed this encounter:  Orders Placed This Encounter  Procedures  . CBC with Differential/Platelet  . Comprehensive metabolic panel  . Magnesium  . Iron and TIBC  . Ferritin   . Folate  . Vitamin B12     Derek Jack, [place] [phone]

## 2019-06-01 NOTE — Assessment & Plan Note (Addendum)
1.  [name] squamous cell carcinoma left lung: -Weekly carboplatin and paclitaxel with radiation from 11/21/2018 through 12/05/2018. -Durvalumab consolidation every 2 weeks started on 02/07/2019, restarted back on 04/21/2019. -CT chest on 04/19/2019 showed persistent left lower lobe airspace consolidation stable from 03/22/2019 compatible with radiation therapy.  Small left pleural effusion.  Mild mediastinal adenopathy likely reactive.  Emphysema and interstitial lung disease stable.  No immunotherapy related pneumonitis. -I have reviewed his labs today.  White count, platelet count and LFTs are normal.  He will proceed with his immunotherapy today. -We will reevaluate him in 2 weeks.  I plan to check iron panel at next visit.  2.  Weight loss: -He lost about 1 pound since last visit 2 weeks ago. -I have encouraged him to improve his intake of he quit plus to 5 cans/day. -I have also given nystatin swish and swallow as he developed mild dysphagia.  3.  Hypomagnesemia: -Continue magnesium 3 times a day.  Magnesium today is 1.8.  4.  Hypokalemia: -Continue potassium 20 mEq daily at home.  Potassium today is 3.8.  5.  CAD: -MI x2 in 2010 and 2019.  Pacemaker placed in normal 2018. -Plavix on hold since he had hemoptysis.  He will continue aspirin.

## 2019-06-01 NOTE — Progress Notes (Signed)
Patient presents today for treatment and follow up visit with Dr. Delton Coombes. Labs within parameters for treatment. Patient has no complaints of any significant changes since his last visit. Patient states he is fatigued. Patient received his second Covid vaccine yesterday.   Per note labs reviewed by Dr. Delton Coombes . Proceed with treatment today.   Treatment given today per MD orders. Tolerated infusion without adverse affects. Vital signs stable. No complaints at this time. Discharged from clinic via wheel chair.  F/U with [facility] as scheduled.

## 2019-06-12 ENCOUNTER — Other Ambulatory Visit (HOSPITAL_COMMUNITY): Payer: Self-pay

## 2019-06-12 DIAGNOSIS — C3490 Malignant neoplasm of unspecified part of unspecified bronchus or lung: Secondary | ICD-10-CM

## 2019-06-12 NOTE — Progress Notes (Signed)
Pharmacist Chemotherapy Monitoring - Follow Up Assessment    I verify that I have reviewed each item in the below checklist:  . Regimen for the patient is scheduled for the appropriate day and plan matches scheduled date. [name] Appropriate non-routine labs are ordered dependent on drug ordered. . If applicable, additional medications reviewed and ordered per protocol based on lifetime cumulative doses and/or treatment regimen.   Plan for follow-up and/or issues identified: No . I-vent associated with next due treatment: No . MD and/or nursing notified: No  Delane Ginger 06/12/2019 8:17 AM

## 2019-06-15 ENCOUNTER — Encounter (HOSPITAL_COMMUNITY): Payer: Self-pay | Admitting: Hematology

## 2019-06-15 ENCOUNTER — Inpatient Hospital Stay (HOSPITAL_COMMUNITY): Payer: Medicare Other

## 2019-06-15 ENCOUNTER — Inpatient Hospital Stay (HOSPITAL_BASED_OUTPATIENT_CLINIC_OR_DEPARTMENT_OTHER): Payer: Medicare Other | Admitting: Hematology

## 2019-06-15 ENCOUNTER — Other Ambulatory Visit: Payer: Self-pay

## 2019-06-15 VITALS — BP 129/62 | HR 70 | Temp 97.1°F | Resp 18 | Wt 155.6 lb

## 2019-06-15 DIAGNOSIS — Z950 Presence of cardiac pacemaker: Secondary | ICD-10-CM | POA: Diagnosis not present

## 2019-06-15 DIAGNOSIS — I252 Old myocardial infarction: Secondary | ICD-10-CM | POA: Diagnosis not present

## 2019-06-15 DIAGNOSIS — E876 Hypokalemia: Secondary | ICD-10-CM | POA: Diagnosis not present

## 2019-06-15 DIAGNOSIS — I2510 Atherosclerotic heart disease of native coronary artery without angina pectoris: Secondary | ICD-10-CM | POA: Diagnosis not present

## 2019-06-15 DIAGNOSIS — C3432 Malignant neoplasm of lower lobe, left bronchus or lung: Secondary | ICD-10-CM | POA: Diagnosis not present

## 2019-06-15 DIAGNOSIS — Z79899 Other long term (current) drug therapy: Secondary | ICD-10-CM | POA: Diagnosis not present

## 2019-06-15 DIAGNOSIS — Z5112 Encounter for antineoplastic immunotherapy: Secondary | ICD-10-CM | POA: Diagnosis not present

## 2019-06-15 DIAGNOSIS — C3490 Malignant neoplasm of unspecified part of unspecified bronchus or lung: Secondary | ICD-10-CM

## 2019-06-15 DIAGNOSIS — Z9221 Personal history of antineoplastic chemotherapy: Secondary | ICD-10-CM | POA: Diagnosis not present

## 2019-06-15 DIAGNOSIS — Z923 Personal history of irradiation: Secondary | ICD-10-CM | POA: Diagnosis not present

## 2019-06-15 LAB — COMPREHENSIVE METABOLIC PANEL
ALT: 10 U/L (ref 0–44)
AST: 13 U/L — ABNORMAL LOW (ref 15–41)
Albumin: 3.6 g/dL (ref 3.5–5.0)
Alkaline Phosphatase: 50 U/L (ref 38–126)
Anion gap: 11 (ref 5–15)
BUN: 18 mg/dL (ref 8–23)
CO2: 27 mmol/L (ref 22–32)
Calcium: 9.5 mg/dL (ref 8.9–10.3)
Chloride: 97 mmol/L — ABNORMAL LOW (ref 98–111)
Creatinine, Ser: 0.97 mg/dL (ref 0.61–1.24)
GFR calc Af Amer: >60 mL/min (ref >60)
GFR calc non Af Amer: >60 mL/min (ref >60)
Glucose, Bld: 154 mg/dL — ABNORMAL HIGH (ref 70–99)
Potassium: 3.8 mmol/L (ref 3.5–5.1)
Sodium: 135 mmol/L (ref 135–145)
Total Bilirubin: 0.8 mg/dL (ref 0.3–1.2)
Total Protein: 7.3 g/dL (ref 6.5–8.1)

## 2019-06-15 LAB — CBC WITH DIFFERENTIAL/PLATELET
Abs Immature Granulocytes: 0.03 10*3/uL (ref 0.00–0.07)
Basophils Absolute: 0 10*3/uL (ref 0.0–0.1)
Basophils Relative: 0 %
Eosinophils Absolute: 0.1 10*3/uL (ref 0.0–0.5)
Eosinophils Relative: 1 %
HCT: 34.4 % — ABNORMAL LOW (ref 39.0–52.0)
Hemoglobin: 10.6 g/dL — ABNORMAL LOW (ref 13.0–17.0)
Immature Granulocytes: 0 %
Lymphocytes Relative: 4 %
Lymphs Abs: 0.3 10*3/uL — ABNORMAL LOW (ref 0.7–4.0)
MCH: 31.2 pg (ref 26.0–34.0)
MCHC: 30.8 g/dL (ref 30.0–36.0)
MCV: 101.2 fL — ABNORMAL HIGH (ref 80.0–100.0)
Monocytes Absolute: 0.4 10*3/uL (ref 0.1–1.0)
Monocytes Relative: 6 %
Neutro Abs: 6.6 10*3/uL (ref 1.7–7.7)
Neutrophils Relative %: 89 %
Platelets: 239 10*3/uL (ref 150–400)
RBC: 3.4 MIL/uL — ABNORMAL LOW (ref 4.22–5.81)
RDW: 14.7 % (ref 11.5–15.5)
WBC: 7.4 10*3/uL (ref 4.0–10.5)
nRBC: 0 % (ref 0.0–0.2)

## 2019-06-15 LAB — IRON AND TIBC
Iron: 38 ug/dL — ABNORMAL LOW (ref 45–182)
Saturation Ratios: 16 % — ABNORMAL LOW (ref 17.9–39.5)
TIBC: 241 ug/dL — ABNORMAL LOW (ref 250–450)
UIBC: 203 ug/dL

## 2019-06-15 LAB — FOLATE: Folate: 8.8 ng/mL (ref >5.9)

## 2019-06-15 LAB — MAGNESIUM: Magnesium: 1.7 mg/dL (ref 1.7–2.4)

## 2019-06-15 LAB — VITAMIN B12: Vitamin B-12: 793 pg/mL (ref 180–914)

## 2019-06-15 LAB — TSH: TSH: 2.075 u[IU]/mL (ref 0.350–4.500)

## 2019-06-15 LAB — FERRITIN: Ferritin: 231 ng/mL (ref 24–336)

## 2019-06-15 NOTE — Patient Instructions (Addendum)
[place] at [facility] Discharge Instructions  You were seen today by Dr. Delton Coombes. He went over your recent lab results. He will hold your treatment today. He will repeat your CT chest. He will see you back in 1 week for labs and follow up.   Thank you for choosing [place] at [facility] to provide your oncology and hematology care.  To afford each patient quality time with our provider, please arrive at least 15 minutes before your scheduled appointment time.   If you have a lab appointment with the [facility] please come in thru the  Main Entrance and check in at the main information desk  You need to re-schedule your appointment should you arrive 10 or more minutes late.  We strive to give you quality time with our providers, and arriving late affects you and other patients whose appointments are after yours.  Also, if you no show three or more times for appointments you may be dismissed from the clinic at the providers discretion.     Again, thank you for choosing [facility].  Our hope is that these requests will decrease the amount of time that you wait before being seen by our physicians.       _____________________________________________________________  Should you have questions after your visit to [facility], please contact our office at [phone] between the hours of 8:00 a.m. and 4:30 p.m.  Voicemails left after 4:00 p.m. will not be returned until the following business day.  For prescription refill requests, have your pharmacy contact our office and allow 72 hours.    Cancer Center Support Programs:   > Cancer Support Group  2nd Tuesday of the month 1pm-2pm, Journey Room

## 2019-06-15 NOTE — Progress Notes (Signed)
Patient has been assessed, vital signs and labs have been reviewed by Dr. Delton Coombes. HOLD treatment today due to weakness and cough per Dr. Delton Coombes. He will return next week after a CT chest.

## 2019-06-15 NOTE — Progress Notes (Signed)
Nicholas Sanchez, [place] [id number]   CLINIC:  Medical Oncology/Hematology  PCP:  Maryruth Hancock, [place] [address] [phone]   REASON FOR VISIT:  Follow-up for lung cancer.  CURRENT THERAPY: Consolidation immunotherapy.   CANCER STAGING: Cancer Staging Squamous cell carcinoma of lung (HCC) Staging form: Lung, AJCC 8th Edition - Clinical stage from 11/09/2018: Stage IIIB (cT1c, cN3, cM0) - Unsigned    INTERVAL HISTORY:  Nicholas Sanchez 86 y.o. male seen for follow-up of lung cancer.  He lost 3 pounds since last visit.  Appetite is 25%.  Energy levels are low at 25%.  Reports some chest pain in the left chest wall area.  He also has some cough and shortness of breath.  He also has some constipation.  Denies any hemoptysis.   REVIEW OF SYSTEMS:  Review of Systems  Constitutional: Positive for fatigue.  Respiratory: Positive for cough and shortness of breath.   Gastrointestinal: Positive for constipation.  All other systems reviewed and are negative.    PAST MEDICAL/SURGICAL HISTORY:  Past Medical History:  Diagnosis Date  . Atherosclerotic heart disease of native coronary artery without angina pectoris   . Bradycardia, unspecified   . CAD (coronary artery disease)    a. STEMI 04/2008 s/p BMS to prox & distal RCA, staged DES to LAD same admission. b. Nuc 03/2017: scar but no ischemia, EF 45-54%. c. 09/2017: NSTEMI - DES x 2 to proximal and mid-RCA  . Cancer ([place])   . Chronic anticoagulation   . Chronic atrial fibrillation ([place])   . Chronic systolic (congestive) heart failure ([place])   . CKD (chronic kidney disease), stage II   . Cough, persistent   . GERD (gastroesophageal reflux disease)   . History of BPH   . HTN (hypertension)   . Hypercholesteremia   . LV dysfunction    a. EF 45-50% in 01/2016.  . Malignant neoplasm of unspecified part of unspecified bronchus or lung ([place])   . Mild pulmonary hypertension ([place])     . Obesity, unspecified   . Other esophagitis without bleeding   . PAF (paroxysmal atrial fibrillation) ([place])   . Pneumonia, unspecified organism   . Port-A-Cath in place 11/21/2018  . Presence of cardiac pacemaker   . Presence of permanent cardiac pacemaker   . Sinus node dysfunction ([place])    a. s/p StJude CRT-pacemaker 01/2016.  [name] Tinnitus, bilateral   . Unspecified glaucoma    Past Surgical History:  Procedure Laterality Date  . BIOPSY  12/15/2018   Procedure: BIOPSY;  Surgeon: [name], MD;  [facility];  Service: Endoscopy;;  esophageal  . CARDIAC CATHETERIZATION    . COLONOSCOPY    . COLONOSCOPY N/A 08/27/2017   Dr. [name]: 25 5-25 mm polyps in descending colon, ascending, cecum. 2X3 cm carpet polyp in base of cecum lifted away s/p piecemeal polypectomy. Tubulovillous adenoma and tubular adenomas  . CORONARY STENT INTERVENTION N/A 10/22/2017   Procedure: CORONARY STENT INTERVENTION;  Surgeon: [name], MD;  Location: [place] CV LAB;  Service: Cardiovascular;  Laterality: N/A;  . CORONARY STENT PLACEMENT  04/2008   RCA and LAD  . EP IMPLANTABLE DEVICE N/A 01/09/2016   Procedure: BiV Pacemaker Insertion CRT-P;  Surgeon: [name], MD;  Location: [place] CV LAB;  Service: Cardiovascular;  Laterality: N/A;  . ESOPHAGOGASTRODUODENOSCOPY N/A 08/27/2017   normal esophagus s/p dilation, gastric petechia, small hiatal hernia, normal duodenum  .  ESOPHAGOGASTRODUODENOSCOPY (EGD) WITH PROPOFOL N/A 12/15/2018   severe esophagitis likely radiation-induced, medium-sized hiatal hernia, normal duodenum  . INSERT / REPLACE / REMOVE PACEMAKER    . MALONEY DILATION N/A 08/27/2017   Procedure: [name] DILATION;  Surgeon: [name], MD;  [facility];  Service: Endoscopy;  Laterality: N/A;  . PORTACATH PLACEMENT Right 11/18/2018   Procedure: INSERTION PORT-A-CATH;  Surgeon: [name], MD;  Location: [facility] ORS;  Service: General;  Laterality: Right;   . RIGHT/LEFT HEART CATH AND CORONARY ANGIOGRAPHY N/A 10/21/2017   Procedure: RIGHT/LEFT HEART CATH AND CORONARY ANGIOGRAPHY;  Surgeon: [name], MD;  Location: [place] CV LAB;  Service: Cardiovascular;  Laterality: N/A;     SOCIAL HISTORY:  Social History   Socioeconomic History  . Marital status: Married    Spouse name: Air traffic controller  . Number of children: 3  . Years of education: Not on file  . Highest education level: Not on file  Occupational History  . Occupation: retired    Comment: Press photographer  Tobacco Use  . Smoking status: Former Smoker    Packs/day: 2.50    Years: 25.00    Pack years: 62.50    Types: Cigarettes    Quit date: 06/14/1973    Years since quitting: 46.0  . Smokeless tobacco: Former Systems developer    Types: Chew    Quit date: 06/14/1973  . Tobacco comment: some/chewed very little for about 2 years  Substance and Sexual Activity  . Alcohol use: No  . Drug use: No  . Sexual activity: Not on file  Other Topics Concern  . 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    Social Determinants of Health   Financial Resource Strain: Low Risk   . Difficulty of Paying Living Expenses: Not very hard  Food Insecurity: No Food Insecurity  . Worried About Charity fundraiser in the Last Year: Never true  . Ran Out of Food in the Last Year: Never true  Transportation Needs: No Transportation Needs  . Lack of Transportation (Medical): No  . Lack of Transportation (Non-Medical): No  Physical Activity: Inactive  . Days of Exercise per Week: 0 days  . Minutes of Exercise per Session: 0 min  Stress: No Stress Concern Present  . Feeling of Stress : Not at all  Social Connections: Slightly Isolated  . Frequency of Communication with Friends and Family: More than three times a week  . Frequency of Social Gatherings with Friends and Family: More than three times a week  . Attends Religious Services: More than 4 times per year  . Active  Member of Clubs or Organizations: No  . Attends Archivist Meetings: Never  . Marital Status: Married  Human resources officer Violence: Not At Risk  . Fear of Current or Ex-Partner: No  . Emotionally Abused: No  . Physically Abused: No  . Sexually Abused: No    FAMILY HISTORY:  Family History  Problem Relation Age of Onset  . Emphysema Mother        mother died with emphysema and cancer  . Cancer Mother   . Prostate cancer Brother   . Heart disease Brother   . Hypothyroidism Daughter   . Hypothyroidism Daughter   . Colon cancer Neg Hx   . Colon polyps Neg Hx     CURRENT MEDICATIONS:  Outpatient Encounter Medications as of 06/15/2019  Medication Sig  . acetaminophen (TYLENOL) 500 MG tablet  Take 500 mg by mouth every 6 (six) hours as needed for pain.  [name] aspirin EC 81 MG tablet Take 81 mg by mouth daily.  . dorzolamide-timolol (COSOPT) 22.3-6.8 MG/ML ophthalmic solution Place 1 drop into the left eye 2 (two) times daily.  . hydrochlorothiazide (HYDRODIURIL) 25 MG tablet TAKE ONE (1) TABLET BY MOUTH EVERY DAY  . lidocaine (XYLOCAINE) 2 % solution Use as directed 15 mLs in the mouth or throat every 6 (six) hours as needed.   . magnesium oxide (MAG-OX) 400 (241.3 Mg) MG tablet Take 1 tablet (400 mg total) by mouth 2 (two) times daily.  . metoprolol succinate (TOPROL-XL) 50 MG 24 hr tablet TAKE ONE (1) TABLET BY MOUTH EVERY DAY  . nitroGLYCERIN (NITROSTAT) 0.4 MG SL tablet Place 0.4 mg under the tongue every 5 (five) minutes as needed for chest pain.  [name] nystatin (MYCOSTATIN) 100000 UNIT/ML suspension Take 5 mLs (500,000 Units total) by mouth 3 (three) times daily. (Patient not taking: Reported on 06/27/2019)  . nystatin-triamcinolone ointment (MYCOLOG) Apply 1 application topically 2 (two) times daily. (Patient not taking: Reported on 06/27/2019)  . pantoprazole (PROTONIX) 40 MG tablet Take 1 tablet (40 mg total) by mouth daily before breakfast.  . potassium chloride 20 MEQ/15ML (10%)  SOLN Take 15 mLs (20 mEq total) by mouth daily.  . tamsulosin (FLOMAX) 0.4 MG CAPS capsule Take 0.4 mg by mouth daily.  . vitamin B-12 (CYANOCOBALAMIN) 1000 MCG tablet Take 1,000 mcg by mouth daily.   No facility-administered encounter medications on file as of 06/15/2019.    ALLERGIES:  Allergies  Allergen Reactions  . Ace Inhibitors Nausea Only  . Tape Rash    And Bandaids, too     PHYSICAL EXAM:  ECOG Performance status: 1  Vitals:   06/15/19 0816  BP: 129/62  Pulse: 70  Resp: 18  Temp: (!) 97.1 F (36.2 C)  SpO2: 98%   Filed Weights   06/15/19 0816  Weight: 155 lb 9.6 oz (70.6 kg)    Physical Exam Vitals reviewed.  Constitutional:      Appearance: Normal appearance.  Cardiovascular:     Rate and Rhythm: Normal rate and regular rhythm.     Heart sounds: Normal heart sounds.  Pulmonary:     Effort: Pulmonary effort is normal.     Breath sounds: Normal breath sounds.  Abdominal:     General: There is no distension.     Palpations: Abdomen is soft. There is no mass.  Musculoskeletal:        General: No swelling.  Skin:    General: Skin is warm.  Neurological:     General: No focal deficit present.     Mental Status: He is alert and oriented to person, place, and time.  Psychiatric:        Mood and Affect: Mood normal.        Behavior: Behavior normal.      LABORATORY DATA:  I have reviewed the labs as listed.  CBC    Component Value Date/Time   WBC 7.6 06/27/2019 0822   RBC 3.26 (L) 06/27/2019 0822   HGB 10.3 (L) 06/27/2019 0822   HCT 32.8 (L) 06/27/2019 0822   PLT 245 06/27/2019 0822   MCV 100.6 (H) 06/27/2019 0822   MCH 31.6 06/27/2019 0822   MCHC 31.4 06/27/2019 0822   RDW 14.9 06/27/2019 0822   LYMPHSABS 0.3 (L) 06/27/2019 0822   MONOABS 0.4 06/27/2019 0822   EOSABS 0.1 06/27/2019 4196  BASOSABS 0.0 06/27/2019 0822   CMP Latest Ref Rng & Units 06/27/2019 06/15/2019 06/01/2019  Glucose 70 - 99 mg/dL 176(H) 154(H) 113(H)  BUN 8 - 23 mg/dL  18 18 16   Creatinine 0.61 - 1.24 mg/dL 0.79 0.97 0.92  Sodium 135 - 145 mmol/L 134(L) 135 134(L)  Potassium 3.5 - 5.1 mmol/L 3.8 3.8 3.8  Chloride 98 - 111 mmol/L 95(L) 97(L) 96(L)  CO2 22 - 32 mmol/L 29 27 28   Calcium 8.9 - 10.3 mg/dL 9.3 9.5 9.4  Total Protein 6.5 - 8.1 g/dL 7.0 7.3 7.0  Total Bilirubin 0.3 - 1.2 mg/dL 0.5 0.8 0.9  Alkaline Phos 38 - 126 U/L 48 50 53  AST 15 - 41 U/L 12(L) 13(L) 12(L)  ALT 0 - 44 U/L 8 10 9        DIAGNOSTIC IMAGING:  I have reviewed scans.    ASSESSMENT & PLAN:   Squamous cell carcinoma of lung ([place]) 1.  [name] squamous cell carcinoma left lung: -Weekly carboplatin and paclitaxel with radiation from 11/21/2018 through 12/05/2018. -Durvalumab consolidation every 2 weeks started on 02/07/2019. -CT chest on 2 72,021 showed persistent left lower lobe airspace consolidation stable since 03/22/2019. -He lost few pounds and is feeling very weak.  Last Imfinzi was on 06/01/2019. -I would hold off on his treatment today.  I will reevaluate him in a couple of weeks.  I will arrange for a CT scan of the chest prior to next visit to evaluate for progression.  2.  Weight loss: -He lost 3 pounds in the last 2 weeks.  I have encouraged him to increase his boost plus to 5 cans/day.  He is eating small quantities of soft foods.  3.  Hypomagnesemia: -Magnesium was 1.7.  He will continue magnesium 3 times a day.  4.  Hypokalemia: -Potassium is 3.8.  Continue potassium 20 mEq daily at home.  5.  CAD: -Plavix is on hold due to hemoptysis.  He will continue aspirin.   Orders placed this encounter:  Orders Placed This Encounter  Procedures  . CT Chest W Contrast     Derek Jack, MD [place] [phone]

## 2019-06-15 NOTE — Patient Instructions (Signed)
[facility] at [facility] Discharge Instructions  Labs drawn from portacath today   Thank you for choosing [facility] at [facility] to provide your oncology and hematology care.  To afford each patient quality time with our provider, please arrive at least 15 minutes before your scheduled appointment time.   If you have a lab appointment with the Cancer Center please come in thru the Main Entrance and check in at the main information desk.  You need to re-schedule your appointment should you arrive 10 or more minutes late.  We strive to give you quality time with our providers, and arriving late affects you and other patients whose appointments are after yours.  Also, if you no show three or more times for appointments you may be dismissed from the clinic at the providers discretion.     Again, thank you for choosing [facility].  Our hope is that these requests will decrease the amount of time that you wait before being seen by our physicians.       _____________________________________________________________  Should you have questions after your visit to [facility], please contact our office at [phone] between the hours of 8:00 a.m. and 4:30 p.m.  Voicemails left after 4:00 p.m. will not be returned until the following business day.  For prescription refill requests, have your pharmacy contact our office and allow 72 hours.    Due to Covid, you will need to wear a mask upon entering the hospital. If you do not have a mask, a mask will be given to you at the Main Entrance upon arrival. For doctor visits, patients may have 1 support person with them. For treatment visits, patients can not have anyone with them due to social distancing guidelines and our immunocompromised population.     

## 2019-06-15 NOTE — Progress Notes (Signed)
0910- Labs and vitals reviewed, patient seen by Dr. Delton Coombes who is holding [place] today for weakness and cough. Pt discharged in satisfactory condition in company of family with follow up instructions.

## 2019-06-16 ENCOUNTER — Ambulatory Visit (HOSPITAL_COMMUNITY): Payer: Non-veteran care

## 2019-06-16 NOTE — Progress Notes (Signed)
Nutrition Follow-up:  Patient with squamous cell carcinoma of left lung.  Patient receiving durvalumab but held due to weakness and cough.     Spoke with patient via phone.  Reports that appetite is not good.  "Food feels like it is getting hung in my throat"  The medicine Dr Raliegh Ip gave me is not working. (?? Nystatin).  Reports drinking some ensure, equate plus shakes.  Wife blended beanie weanie and has been able to eat that.  Eating chicken noodle soup but mostly just the broth.  Patient short of breath and coughing during call.  Reports constipation as well and took a dulcolax and hoping for relief.      Medications: reviewed  Labs: reviewed  Anthropometrics:   Weight is 155 lb on 4/15 decreased from 164 lb   NUTRITION DIAGNOSIS:  Inadequate oral intake decreased   INTERVENTION:  Will send message regarding medicine not working for dysphagia per patient.  Encouraged patient to continue blending foods and drinking oral nutrition supplements Patient may benefit from appetite stimulant Discussed ways to prevent constipation.   Patient has contact information    MONITORING, EVALUATION, GOAL: Patient will consume adequate calories to prevent weight loss   NEXT VISIT: phone f/u May 14  Shabre Kreher B. Zenia Resides, [place], [place] Registered Dietitian [phone] (pager)

## 2019-06-20 ENCOUNTER — Ambulatory Visit (HOSPITAL_COMMUNITY): Payer: Non-veteran care

## 2019-06-21 NOTE — Progress Notes (Signed)

## 2019-06-22 ENCOUNTER — Ambulatory Visit (HOSPITAL_COMMUNITY): Payer: Non-veteran care

## 2019-06-22 ENCOUNTER — Other Ambulatory Visit (HOSPITAL_COMMUNITY): Payer: Non-veteran care

## 2019-06-22 ENCOUNTER — Ambulatory Visit (HOSPITAL_COMMUNITY): Payer: Non-veteran care | Admitting: Hematology

## 2019-06-26 ENCOUNTER — Other Ambulatory Visit: Payer: Self-pay

## 2019-06-26 ENCOUNTER — Ambulatory Visit (HOSPITAL_COMMUNITY)
Admission: RE | Admit: 2019-06-26 | Discharge: 2019-06-26 | Disposition: A | Discharge status: Home / Self Care | Payer: Medicare Other | Source: Ambulatory Visit | Attending: Hematology | Admitting: Hematology

## 2019-06-26 DIAGNOSIS — C3490 Malignant neoplasm of unspecified part of unspecified bronchus or lung: Secondary | ICD-10-CM | POA: Diagnosis not present

## 2019-06-26 MED ORDER — IOHEXOL 300 MG/ML  SOLN
75.0000 mL | Freq: Once | INTRAMUSCULAR | Status: AC | PRN
  Administered 2019-06-26: 75 mL via INTRAVENOUS

## 2019-06-27 ENCOUNTER — Inpatient Hospital Stay (HOSPITAL_COMMUNITY): Payer: Medicare Other

## 2019-06-27 ENCOUNTER — Encounter (HOSPITAL_COMMUNITY): Payer: Self-pay | Admitting: Hematology

## 2019-06-27 ENCOUNTER — Inpatient Hospital Stay (HOSPITAL_BASED_OUTPATIENT_CLINIC_OR_DEPARTMENT_OTHER): Payer: Medicare Other | Admitting: Hematology

## 2019-06-27 DIAGNOSIS — Z950 Presence of cardiac pacemaker: Secondary | ICD-10-CM | POA: Diagnosis not present

## 2019-06-27 DIAGNOSIS — E876 Hypokalemia: Secondary | ICD-10-CM | POA: Diagnosis not present

## 2019-06-27 DIAGNOSIS — I252 Old myocardial infarction: Secondary | ICD-10-CM | POA: Diagnosis not present

## 2019-06-27 DIAGNOSIS — C3490 Malignant neoplasm of unspecified part of unspecified bronchus or lung: Secondary | ICD-10-CM

## 2019-06-27 DIAGNOSIS — Z923 Personal history of irradiation: Secondary | ICD-10-CM | POA: Diagnosis not present

## 2019-06-27 DIAGNOSIS — Z9221 Personal history of antineoplastic chemotherapy: Secondary | ICD-10-CM | POA: Diagnosis not present

## 2019-06-27 DIAGNOSIS — Z79899 Other long term (current) drug therapy: Secondary | ICD-10-CM | POA: Diagnosis not present

## 2019-06-27 DIAGNOSIS — C3432 Malignant neoplasm of lower lobe, left bronchus or lung: Secondary | ICD-10-CM | POA: Diagnosis not present

## 2019-06-27 DIAGNOSIS — I2510 Atherosclerotic heart disease of native coronary artery without angina pectoris: Secondary | ICD-10-CM | POA: Diagnosis not present

## 2019-06-27 DIAGNOSIS — Z5112 Encounter for antineoplastic immunotherapy: Secondary | ICD-10-CM | POA: Diagnosis not present

## 2019-06-27 LAB — CBC WITH DIFFERENTIAL/PLATELET
Abs Immature Granulocytes: 0.03 10*3/uL (ref 0.00–0.07)
Basophils Absolute: 0 10*3/uL (ref 0.0–0.1)
Basophils Relative: 0 %
Eosinophils Absolute: 0.1 10*3/uL (ref 0.0–0.5)
Eosinophils Relative: 1 %
HCT: 32.8 % — ABNORMAL LOW (ref 39.0–52.0)
Hemoglobin: 10.3 g/dL — ABNORMAL LOW (ref 13.0–17.0)
Immature Granulocytes: 0 %
Lymphocytes Relative: 5 %
Lymphs Abs: 0.3 10*3/uL — ABNORMAL LOW (ref 0.7–4.0)
MCH: 31.6 pg (ref 26.0–34.0)
MCHC: 31.4 g/dL (ref 30.0–36.0)
MCV: 100.6 fL — ABNORMAL HIGH (ref 80.0–100.0)
Monocytes Absolute: 0.4 10*3/uL (ref 0.1–1.0)
Monocytes Relative: 5 %
Neutro Abs: 6.7 10*3/uL (ref 1.7–7.7)
Neutrophils Relative %: 89 %
Platelets: 245 10*3/uL (ref 150–400)
RBC: 3.26 MIL/uL — ABNORMAL LOW (ref 4.22–5.81)
RDW: 14.9 % (ref 11.5–15.5)
WBC: 7.6 10*3/uL (ref 4.0–10.5)
nRBC: 0 % (ref 0.0–0.2)

## 2019-06-27 LAB — MAGNESIUM: Magnesium: 1.6 mg/dL — ABNORMAL LOW (ref 1.7–2.4)

## 2019-06-27 LAB — COMPREHENSIVE METABOLIC PANEL
ALT: 8 U/L (ref 0–44)
AST: 12 U/L — ABNORMAL LOW (ref 15–41)
Albumin: 3.6 g/dL (ref 3.5–5.0)
Alkaline Phosphatase: 48 U/L (ref 38–126)
Anion gap: 10 (ref 5–15)
BUN: 18 mg/dL (ref 8–23)
CO2: 29 mmol/L (ref 22–32)
Calcium: 9.3 mg/dL (ref 8.9–10.3)
Chloride: 95 mmol/L — ABNORMAL LOW (ref 98–111)
Creatinine, Ser: 0.79 mg/dL (ref 0.61–1.24)
GFR calc Af Amer: >60 mL/min (ref >60)
GFR calc non Af Amer: >60 mL/min (ref >60)
Glucose, Bld: 176 mg/dL — ABNORMAL HIGH (ref 70–99)
Potassium: 3.8 mmol/L (ref 3.5–5.1)
Sodium: 134 mmol/L — ABNORMAL LOW (ref 135–145)
Total Bilirubin: 0.5 mg/dL (ref 0.3–1.2)
Total Protein: 7 g/dL (ref 6.5–8.1)

## 2019-06-27 MED ORDER — HEPARIN SOD (PORK) LOCK FLUSH 100 UNIT/ML IV SOLN
500.0000 [IU] | Freq: Once | INTRAVENOUS | Status: AC
  Administered 2019-06-27: 12:00:00 500 [IU] via INTRAVENOUS

## 2019-06-27 MED ORDER — MAGNESIUM SULFATE 2 GM/50ML IV SOLN
2.0000 g | Freq: Once | INTRAVENOUS | Status: AC
  Administered 2019-06-27: 11:00:00 2 g via INTRAVENOUS

## 2019-06-27 MED ORDER — MAGNESIUM SULFATE 2 GM/50ML IV SOLN
INTRAVENOUS | Status: AC
  Filled 2019-06-27: qty 50

## 2019-06-27 MED ORDER — SODIUM CHLORIDE 0.9 % IV SOLN: INTRAVENOUS | Status: DC

## 2019-06-27 MED ORDER — SODIUM CHLORIDE 0.9% FLUSH
10.0000 mL | Freq: Once | INTRAVENOUS | Status: AC
  Administered 2019-06-27: 12:00:00 10 mL via INTRAVENOUS

## 2019-06-27 NOTE — Patient Instructions (Signed)
[facility] at [facility]  Discharge Instructions:   _______________________________________________________________  Thank you for choosing [facility] at [facility] to provide your oncology and hematology care.  To afford each patient quality time with our providers, please arrive at least 15 minutes before your scheduled appointment.  You need to re-schedule your appointment if you arrive 10 or more minutes late.  We strive to give you quality time with our providers, and arriving late affects you and other patients whose appointments are after yours.  Also, if you no show three or more times for appointments you may be dismissed from the clinic.  Again, thank you for choosing [facility] at [facility]. Our hope is that these requests will allow you access to exceptional care and in a timely manner. _______________________________________________________________  If you have questions after your visit, please contact our office at [phone] between the hours of 8:30 a.m. and 5:00 p.m. Voicemails left after 4:30 p.m. will not be returned until the following business day. _______________________________________________________________  For prescription refill requests, have your pharmacy contact our office. _______________________________________________________________  Recommendations made by the consultant and any test results will be sent to your referring physician. _______________________________________________________________ 

## 2019-06-27 NOTE — Patient Instructions (Addendum)
[place] at [facility] Discharge Instructions  You were seen today by Dr. Delton Coombes. He went over your recent lab and scan results. He discussed your scan results and possible treatment options. He will see you back in 1 week for labs and follow up.   Thank you for choosing [place] at [facility] to provide your oncology and hematology care.  To afford each patient quality time with our provider, please arrive at least 15 minutes before your scheduled appointment time.   If you have a lab appointment with the [facility] please come in thru the  Main Entrance and check in at the main information desk  You need to re-schedule your appointment should you arrive 10 or more minutes late.  We strive to give you quality time with our providers, and arriving late affects you and other patients whose appointments are after yours.  Also, if you no show three or more times for appointments you may be dismissed from the clinic at the providers discretion.     Again, thank you for choosing [facility].  Our hope is that these requests will decrease the amount of time that you wait before being seen by our physicians.       _____________________________________________________________  Should you have questions after your visit to [facility], please contact our office at [phone] between the hours of 8:00 a.m. and 4:30 p.m.  Voicemails left after 4:00 p.m. will not be returned until the following business day.  For prescription refill requests, have your pharmacy contact our office and allow 72 hours.    Cancer Center Support Programs:   > Cancer Support Group  2nd Tuesday of the month 1pm-2pm, Journey Room

## 2019-06-27 NOTE — Progress Notes (Signed)
DISCONTINUE ON PATHWAY REGIMEN - Non-Small Cell Lung     A cycle is every 14 days:     Durvalumab   **Always confirm dose/schedule in your pharmacy ordering system**  REASON: Disease Progression PRIOR TREATMENT: [id number]: Durvalumab 10 mg/kg q14 Days x up to 12 Months TREATMENT RESPONSE: Progressive Disease (PD)  START ON PATHWAY REGIMEN - Non-Small Cell Lung     A cycle is every 21 days:     Docetaxel   **Always confirm dose/schedule in your pharmacy ordering system**  Patient Characteristics: Stage IV Metastatic, Squamous, PS = 2, Second Line, Prior PD-1/PD-L1  Inhibitor or Not a Candidate for Immunotherapy Therapeutic Status: Stage IV Metastatic Histology: Squamous Cell Line of therapy: Second Line ECOG Performance Status: 2 PD-L1 Expression Status: PD-L1 Positive 1-49% (TPS) Immunotherapy Candidate Status: Not a Candidate for Immunotherapy Prior Immunotherapy Status: Prior PD-1/PD-L1 Inhibitor Intent of Therapy: Non-Curative / Palliative Intent, Discussed with Patient

## 2019-06-27 NOTE — Progress Notes (Signed)
ON PATHWAY REGIMEN - Non-Small Cell Lung  No Change  Continue With Treatment as Ordered.     A cycle is every 14 days:     Durvalumab   **Always confirm dose/schedule in your pharmacy ordering system**  Patient Characteristics: Stage III - Unresectable, PS = 0, 1 AJCC T Category: T1c Current Disease Status: No Distant Mets or Local Recurrence AJCC N Category: N3 AJCC M Category: M0 AJCC 8 Stage Grouping: IIIB ECOG Performance Status: 1 Intent of Therapy: Non-Curative / Palliative Intent, Discussed with Patient

## 2019-06-27 NOTE — Progress Notes (Addendum)
Nicholas Sanchez, Nicholas Sanchez [id number]   CLINIC:  Medical Oncology/Hematology  PCP:  Nicholas Sanchez, Nicholas Sanchez [id number] [phone]   REASON FOR VISIT:  Follow-up for lung cancer.  CURRENT THERAPY: Consolidation immunotherapy.   CANCER STAGING: Cancer Staging Squamous cell carcinoma of lung (HCC) Staging form: Lung, AJCC 8th Edition - Clinical stage from 11/09/2018: Stage IIIB (cT1c, cN3, cM0) - Unsigned    INTERVAL HISTORY:  Nicholas Sanchez 86 y.o. male seen for follow-up of lung cancer.  He had CT scan done yesterday.  Appetite and energy levels are 25%.  Lost another 3 pounds.  Reports some pain in the left chest wall area and left shoulder.  Reports some cough and shortness of breath but overall thinks it has improved in the last 1 week.  He has trouble swallowing solid foods.  He is drinking about 5 cans of boost plus daily.  He reports some constipation but denies any diarrhea.   REVIEW OF SYSTEMS:  Review of Systems  HENT:   Positive for trouble swallowing.   Respiratory: Positive for cough and shortness of breath.   Gastrointestinal: Positive for constipation.  Neurological: Positive for dizziness.  All other systems reviewed and are negative.    PAST MEDICAL/SURGICAL HISTORY:  Past Medical History:  Diagnosis Date  . Atherosclerotic heart disease of native coronary artery without angina pectoris   . Bradycardia, unspecified   . CAD (coronary artery disease)    a. STEMI 04/2008 s/p BMS to prox & distal RCA, staged DES to LAD same admission. b. Nuc 03/2017: scar but no ischemia, EF 45-54%. c. 09/2017: NSTEMI - DES x 2 to proximal and mid-RCA  . Cancer ([place])   . Chronic anticoagulation   . Chronic atrial fibrillation ([facility])   . Chronic systolic (congestive) heart failure ([place])   . CKD (chronic kidney disease), stage II   . Cough, persistent   . GERD (gastroesophageal reflux disease)   . History of BPH   . HTN  (hypertension)   . Hypercholesteremia   . LV dysfunction    a. EF 45-50% in 01/2016.  . Malignant neoplasm of unspecified part of unspecified bronchus or lung ([place])   . Mild pulmonary hypertension ([place])   . Obesity, unspecified   . Other esophagitis without bleeding   . PAF (paroxysmal atrial fibrillation) ([place])   . Pneumonia, unspecified organism   . Port-A-Cath in place 11/21/2018  . Presence of cardiac pacemaker   . Presence of permanent cardiac pacemaker   . Sinus node dysfunction ([place])    a. s/p StJude CRT-pacemaker 01/2016.  [name] Tinnitus, bilateral   . Unspecified glaucoma    Past Surgical History:  Procedure Laterality Date  . BIOPSY  12/15/2018   Procedure: BIOPSY;  Surgeon: [name], MD;  [facility];  Service: Endoscopy;;  esophageal  . CARDIAC CATHETERIZATION    . COLONOSCOPY    . COLONOSCOPY N/A 08/27/2017   Dr. [name]: 25 5-25 mm polyps in descending colon, ascending, cecum. 2X3 cm carpet polyp in base of cecum lifted away s/p piecemeal polypectomy. Tubulovillous adenoma and tubular adenomas  . CORONARY STENT INTERVENTION N/A 10/22/2017   Procedure: CORONARY STENT INTERVENTION;  Surgeon: [name], MD;  Location: [place];  Service: Cardiovascular;  Laterality: N/A;  . CORONARY STENT PLACEMENT  04/2008   RCA and LAD  . EP IMPLANTABLE DEVICE N/A 01/09/2016   Procedure: BiV Pacemaker Insertion CRT-P;  Surgeon: [name], MD;  Location: [place] [facility];  Service: Cardiovascular;  Laterality: N/A;  . ESOPHAGOGASTRODUODENOSCOPY N/A 08/27/2017   normal esophagus s/p dilation, gastric petechia, small hiatal hernia, normal duodenum  . ESOPHAGOGASTRODUODENOSCOPY (EGD) WITH PROPOFOL N/A 12/15/2018   severe esophagitis likely radiation-induced, medium-sized hiatal hernia, normal duodenum  . INSERT / REPLACE / REMOVE PACEMAKER    . MALONEY DILATION N/A 08/27/2017   Procedure: [name] DILATION;  Surgeon: [name], MD;  [facility];  Service: Endoscopy;  Laterality: N/A;  . PORTACATH PLACEMENT Right 11/18/2018   Procedure: INSERTION PORT-A-CATH;  Surgeon: [name], MD;  Location: [facility];  Service: General;  Laterality: Right;  . RIGHT/LEFT HEART CATH AND CORONARY ANGIOGRAPHY N/A 10/21/2017   Procedure: RIGHT/LEFT HEART CATH AND CORONARY ANGIOGRAPHY;  Surgeon: [name], MD;  Location: [facility];  Service: Cardiovascular;  Laterality: N/A;     SOCIAL HISTORY:  Social History   Socioeconomic History  . Marital status: Married    Spouse name: Air traffic controller  . Number of children: 3  . Years of education: Not on file  . Highest education level: Not on file  Occupational History  . Occupation: retired    Comment: Press photographer  Tobacco Use  . Smoking status: Former Smoker    Packs/day: 2.50    Years: 25.00    Pack years: 62.50    Types: Cigarettes    Quit date: 06/14/1973    Years since quitting: 46.0  . Smokeless tobacco: Former Systems developer    Types: Chew    Quit date: 06/14/1973  . Tobacco comment: some/chewed very little for about 2 years  Substance and Sexual Activity  . Alcohol use: No  . Drug use: No  . Sexual activity: Not on file  Other Topics Concern  . 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    Social Determinants of Health   Financial Resource Strain: Low Risk   . Difficulty of Paying Living Expenses: Not very hard  Food Insecurity: No Food Insecurity  . Worried About Charity fundraiser in the Last Year: Never true  . Ran Out of Food in the Last Year: Never true  Transportation Needs: No Transportation Needs  . Lack of Transportation (Medical): No  . Lack of Transportation (Non-Medical): No  Physical Activity: Inactive  . Days of Exercise per Week: 0 days  . Minutes of Exercise per Session: 0 min  Stress: No Stress Concern Present  . Feeling of Stress : Not at all  Social Connections: Slightly Isolated  . Frequency of Communication with  Friends and Family: More than three times a week  . Frequency of Social Gatherings with Friends and Family: More than three times a week  . Attends Religious Services: More than 4 times per year  . Active Member of Clubs or Organizations: No  . Attends Archivist Meetings: Never  . Marital Status: Married  Human resources officer Violence: Not At Risk  . Fear of Current or Ex-Partner: No  . Emotionally Abused: No  . Physically Abused: No  . Sexually Abused: No    FAMILY HISTORY:  Family History  Problem Relation Age of Onset  . Emphysema Mother        mother died with emphysema and cancer  . Cancer Mother   . Prostate cancer Brother   . Heart disease Brother   . Hypothyroidism Daughter   . Hypothyroidism Daughter   .  Colon cancer Neg Hx   . Colon polyps Neg Hx     CURRENT MEDICATIONS:  Outpatient Encounter Medications as of 06/27/2019  Medication Sig  . aspirin EC 81 MG tablet Take 81 mg by mouth daily.  . dorzolamide-timolol (COSOPT) 22.3-6.8 MG/ML ophthalmic solution Place 1 drop into the left eye 2 (two) times daily.  . hydrochlorothiazide (HYDRODIURIL) 25 MG tablet TAKE ONE (1) TABLET BY MOUTH EVERY DAY  . magnesium oxide (MAG-OX) 400 (241.3 Mg) MG tablet Take 1 tablet (400 mg total) by mouth 2 (two) times daily.  . metoprolol succinate (TOPROL-XL) 50 MG 24 hr tablet TAKE ONE (1) TABLET BY MOUTH EVERY DAY  . pantoprazole (PROTONIX) 40 MG tablet Take 1 tablet (40 mg total) by mouth daily before breakfast.  . potassium chloride 20 MEQ/15ML (10%) SOLN Take 15 mLs (20 mEq total) by mouth daily.  . tamsulosin (FLOMAX) 0.4 MG CAPS capsule Take 0.4 mg by mouth daily.  [name] acetaminophen (TYLENOL) 500 MG tablet Take 500 mg by mouth every 6 (six) hours as needed for pain.  [name] lidocaine (XYLOCAINE) 2 % solution Use as directed 15 mLs in the mouth or throat every 6 (six) hours as needed.   . nitroGLYCERIN (NITROSTAT) 0.4 MG SL tablet Place 0.4 mg under the tongue every 5 (five) minutes  as needed for chest pain.  [name] nystatin (MYCOSTATIN) 100000 UNIT/ML suspension Take 5 mLs (500,000 Units total) by mouth 3 (three) times daily. (Patient not taking: Reported on 06/27/2019)  . nystatin-triamcinolone ointment (MYCOLOG) Apply 1 application topically 2 (two) times daily. (Patient not taking: Reported on 06/27/2019)  . vitamin B-12 (CYANOCOBALAMIN) 1000 MCG tablet Take 1,000 mcg by mouth daily.   No facility-administered encounter medications on file as of 06/27/2019.    ALLERGIES:  Allergies  Allergen Reactions  . Ace Inhibitors Nausea Only  . Tape Rash    And Bandaids, too     PHYSICAL EXAM:  ECOG Performance status: 1  Vitals:   06/27/19 0812  BP: 122/71  Pulse: 82  Resp: 18  Temp: (!) 97.1 F (36.2 C)  SpO2: 98%   Filed Weights   06/27/19 0812  Weight: 152 lb 9.6 oz (69.2 kg)    Physical Exam Vitals reviewed.  Constitutional:      Appearance: Normal appearance.  Cardiovascular:     Rate and Rhythm: Normal rate and regular rhythm.     Heart sounds: Normal heart sounds.  Pulmonary:     Effort: Pulmonary effort is normal.     Breath sounds: Normal breath sounds.  Abdominal:     General: There is no distension.     Palpations: Abdomen is soft. There is no mass.  Musculoskeletal:        General: No swelling.  Skin:    General: Skin is warm.  Neurological:     General: No focal deficit present.     Mental Status: He is alert and oriented to person, place, and time.  Psychiatric:        Mood and Affect: Mood normal.        Behavior: Behavior normal.      LABORATORY DATA:  I have reviewed the labs as listed.  CBC    Component Value Date/Time   WBC 7.6 06/27/2019 0822   RBC 3.26 (L) 06/27/2019 0822   HGB 10.3 (L) 06/27/2019 0822   HCT 32.8 (L) 06/27/2019 0822   PLT 245 06/27/2019 0822   MCV 100.6 (H) 06/27/2019 0822   MCH 31.6 06/27/2019  0822   MCHC 31.4 06/27/2019 0822   RDW 14.9 06/27/2019 0822   LYMPHSABS 0.3 (L) 06/27/2019 0822    MONOABS 0.4 06/27/2019 0822   EOSABS 0.1 06/27/2019 0822   BASOSABS 0.0 06/27/2019 0822   CMP Latest Ref Rng & Units 06/27/2019 06/15/2019 06/01/2019  Glucose 70 - 99 mg/dL 176(H) 154(H) 113(H)  BUN 8 - 23 mg/dL 18 18 16   Creatinine 0.61 - 1.24 mg/dL 0.79 0.97 0.92  Sodium 135 - 145 mmol/L 134(L) 135 134(L)  Potassium 3.5 - 5.1 mmol/L 3.8 3.8 3.8  Chloride 98 - 111 mmol/L 95(L) 97(L) 96(L)  CO2 22 - 32 mmol/L 29 27 28   Calcium 8.9 - 10.3 mg/dL 9.3 9.5 9.4  Total Protein 6.5 - 8.1 g/dL 7.0 7.3 7.0  Total Bilirubin 0.3 - 1.2 mg/dL 0.5 0.8 0.9  Alkaline Phos 38 - 126 U/L 48 50 53  AST 15 - 41 U/L 12(L) 13(L) 12(L)  ALT 0 - 44 U/L 8 10 9        DIAGNOSTIC IMAGING:  I have independently reviewed scans and discussed with the patient.    ASSESSMENT & PLAN:   Squamous cell carcinoma of lung ([place]) 1.  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 -Durvalumab consolidation every 2 weeks started on 02/07/2019.  Last Imfinzi on 06/01/2019. -We reviewed results of CT chest with contrast from 06/26/2019.  There is interval enlargement of the left lower lobe mass measuring 3.6 x 3.4 cm, previously 2.4 x 2.1 cm.  There is also interval increase in small left pleural effusion, increased pleural thickening, questioning for pleural metastatic disease.  Unchanged enlarged mediastinal lymph nodes. -His disease has clearly advanced.  We will discontinue durvalumab. -We had a prolonged discussion about further options including palliative chemotherapy, most likely single agent versus best supportive care in the form of hospice. -I have recommended docetaxel in this setting.  I do not believe he will be able to tolerate every 3-week dose.  Hence I will give him small weekly doses either 2 weeks on 1 week off or 3 weeks on 1 week off based on tolerability.  We also discussed about the side effects in detail. -Patient would like  to think about it and let us know next week.  2.  Weight loss: -He lost 3 more pounds from last visit. -He is drinking 5 cans of boost plus per day.  He is eating very little solid foods.  I think malignancy is contributing to his weight loss.  3.  Hypomagnesemia: -Magnesium today is 1.6 today.  He will receive magnesium with 500 mL of normal saline. -He will continue magnesium 3 times a day.  4.  Hypokalemia: -Potassium is normal today.  Continue potassium 20 mEq daily at home.  5.  CAD: -Plavix on hold due to hemoptysis.  He will continue aspirin.   Orders placed this encounter:  No orders of the defined types were placed in this encounter.  Total time spent is 40 minutes with more than 50% of the time spent face-to-face discussing scan results, treatment options, counseling and coordination of care.  Derek Jack, MD [place] [phone]

## 2019-06-27 NOTE — Progress Notes (Signed)
Patient has been assessed, vital signs and labs have been reviewed by Dr. Delton Coombes. Due to progression on recent scan. Magnesium is low today please give 2 grams IV as well as 500 ml NS over an hour per Dr. Delton Coombes.

## 2019-06-27 NOTE — Assessment & Plan Note (Addendum)
1.  [name] squamous cell carcinoma left lung: -Weekly carboplatin and paclitaxel with radiation from 11/21/2018 through 12/05/2018. -Durvalumab consolidation every 2 weeks started on 02/07/2019. -CT chest on 2 72,021 showed persistent left lower lobe airspace consolidation stable since 03/22/2019. -He lost few pounds and is feeling very weak.  Last Imfinzi was on 06/01/2019. -I would hold off on his treatment today.  I will reevaluate him in a couple of weeks.  I will arrange for a CT scan of the chest prior to next visit to evaluate for progression.  2.  Weight loss: -He lost 3 pounds in the last 2 weeks.  I have encouraged him to increase his boost plus to 5 cans/day.  He is eating small quantities of soft foods.  3.  Hypomagnesemia: -Magnesium was 1.7.  He will continue magnesium 3 times a day.  4.  Hypokalemia: -Potassium is 3.8.  Continue potassium 20 mEq daily at home.  5.  CAD: -Plavix is on hold due to hemoptysis.  He will continue aspirin.

## 2019-06-27 NOTE — Progress Notes (Signed)
No treatment today per MD. Will be giving fluids with Magnesium per orders today.   Docetaxel information form [email or web address] given to patient and daughter.     Fluids and Magnesium given per orders. Patient tolerated it well without problems. Vitals stable and discharged home from clinic via wheelchair. Follow up as scheduled.

## 2019-06-27 NOTE — Assessment & Plan Note (Signed)
1.  Nicholas Sanchez squamous cell carcinoma of the left lung: -Weekly carboplatin and paclitaxel with radiation from 11/21/2018 through 12/05/2018. -Durvalumab consolidation every 2 weeks started on 02/07/2019.  Last Imfinzi on 06/01/2019. -We reviewed results of CT chest with contrast from 06/26/2019.  There is interval enlargement of the left lower lobe mass measuring 3.6 x 3.4 cm, previously 2.4 x 2.1 cm.  There is also interval increase in small left pleural effusion, increased pleural thickening, questioning for pleural metastatic disease.  Unchanged enlarged mediastinal lymph nodes. -His disease has clearly advanced.  We will discontinue durvalumab. -We had a prolonged discussion about further options including palliative chemotherapy, most likely single agent versus best supportive care in the form of hospice. -I have recommended docetaxel in this setting.  I do not believe he will be able to tolerate every 3-week dose.  Hence I will give him small weekly doses either 2 weeks on 1 week off or 3 weeks on 1 week off based on tolerability.  We also discussed about the side effects in detail. -Patient would like to think about it and let us know next week.  2.  Weight loss: -He lost 3 more pounds from last visit. -He is drinking 5 cans of boost plus per day.  He is eating very little solid foods.  I think malignancy is contributing to his weight loss.  3.  Hypomagnesemia: -Magnesium today is 1.6 today.  He will receive magnesium with 500 mL of normal saline. -He will continue magnesium 3 times a day.  4.  Hypokalemia: -Potassium is normal today.  Continue potassium 20 mEq daily at home.  5.  CAD: -Plavix on hold due to hemoptysis.  He will continue aspirin.

## 2019-06-27 NOTE — Progress Notes (Signed)
Call report from [facility] Radiology:  Dr. Delton Coombes already aware.   IMPRESSION: 1. Significant interval enlargement of a mass and adjacent consolidation of the superior segment left lower lobe, hypodense mass measuring approximately 3.6 x 3.4 cm, previously 2.4 x 2.1 cm when measured similarly, consistent with worsened malignancy.  2. Interval increase in a small left pleural effusion with significant, increased pleural thickening, presumably pleural metastatic disease.  3.  Unchanged enlarged mediastinal lymph nodes.  4. Underlying emphysema and pulmonary fibrosis. Emphysema (ICD10-J43.9).  5.  Coronary artery disease.  Aortic Atherosclerosis (ICD10-I70.0).  6. Enlargement of the main pulmonary artery, as can be seen in pulmonary hypertension.

## 2019-07-03 ENCOUNTER — Telehealth (HOSPITAL_COMMUNITY): Payer: Self-pay | Admitting: Surgery

## 2019-07-03 ENCOUNTER — Telehealth (HOSPITAL_COMMUNITY): Payer: Self-pay

## 2019-07-03 NOTE — Telephone Encounter (Signed)
Pt had left a voicemail stating that he wanted to talk to a nurse before his appointment this Thursday.  I called the pt and he stated that he does not want to continue chemotherapy and wanted to let Dr. Delton Coombes know.    Dr. Delton Coombes was made aware of the pt's decision.

## 2019-07-03 NOTE — Telephone Encounter (Signed)
Spoke with patient today. He has decided to not have anymore treatments at this time. He wants to do Hospice. I have let Dr.Katragadda and scheduling know.

## 2019-07-06 ENCOUNTER — Ambulatory Visit (HOSPITAL_COMMUNITY): Payer: Medicare Other

## 2019-07-06 ENCOUNTER — Other Ambulatory Visit (HOSPITAL_COMMUNITY): Payer: Medicare Other

## 2019-07-06 ENCOUNTER — Ambulatory Visit (HOSPITAL_COMMUNITY): Payer: Medicare Other | Admitting: Hematology

## 2019-07-11 ENCOUNTER — Encounter (HOSPITAL_COMMUNITY): Payer: Self-pay | Admitting: Lab

## 2019-07-11 NOTE — Progress Notes (Unsigned)
Referral sent to Hospice.  Records faxed on 5/11

## 2019-07-13 ENCOUNTER — Ambulatory Visit (HOSPITAL_COMMUNITY): Payer: Medicare Other

## 2019-07-13 ENCOUNTER — Other Ambulatory Visit (HOSPITAL_COMMUNITY): Payer: Medicare Other

## 2019-07-13 ENCOUNTER — Ambulatory Visit (HOSPITAL_COMMUNITY): Payer: Medicare Other | Admitting: Hematology

## 2019-07-14 ENCOUNTER — Encounter (HOSPITAL_COMMUNITY): Payer: Non-veteran care

## 2019-07-17 ENCOUNTER — Telehealth: Payer: Self-pay | Admitting: Gastroenterology

## 2019-07-17 NOTE — Telephone Encounter (Signed)
PATIENT CALLED TO CANCEL APPOINTMENT, STATED HOSPICE IS TAKING OVER HIS CARE AND HE WANTED YOU TO KNOW.

## 2019-07-18 ENCOUNTER — Ambulatory Visit (INDEPENDENT_AMBULATORY_CARE_PROVIDER_SITE_OTHER): Payer: Medicare Other | Admitting: *Deleted

## 2019-07-18 DIAGNOSIS — I482 Chronic atrial fibrillation, unspecified: Secondary | ICD-10-CM

## 2019-07-18 DIAGNOSIS — I5022 Chronic systolic (congestive) heart failure: Secondary | ICD-10-CM

## 2019-07-19 ENCOUNTER — Ambulatory Visit: Payer: Non-veteran care | Admitting: Gastroenterology

## 2019-07-19 LAB — CUP PACEART REMOTE DEVICE CHECK
Battery Remaining Longevity: 82 mo
Battery Remaining Percentage: 95.5 %
Battery Voltage: 2.96 V
Brady Statistic AP VP Percent: 83 %
Brady Statistic AP VS Percent: 1 %
Brady Statistic AS VP Percent: 16 %
Brady Statistic AS VS Percent: 1 %
Brady Statistic RA Percent Paced: 39 %
Date Time Interrogation Session: 20210519131312
Implantable Lead Implant Date: 20171109
Implantable Lead Implant Date: 20171109
Implantable Lead Implant Date: 20171109
Implantable Lead Location: 753858
Implantable Lead Location: 753859
Implantable Lead Location: 753860
Implantable Pulse Generator Implant Date: 20171109
Lead Channel Impedance Value: 450 Ohm
Lead Channel Impedance Value: 590 Ohm
Lead Channel Impedance Value: 610 Ohm
Lead Channel Pacing Threshold Amplitude: 0.5 V
Lead Channel Pacing Threshold Amplitude: 0.5 V
Lead Channel Pacing Threshold Amplitude: 1.5 V
Lead Channel Pacing Threshold Pulse Width: 0.5 ms
Lead Channel Pacing Threshold Pulse Width: 0.5 ms
Lead Channel Pacing Threshold Pulse Width: 0.8 ms
Lead Channel Sensing Intrinsic Amplitude: 12 mV
Lead Channel Sensing Intrinsic Amplitude: 2.5 mV
Lead Channel Setting Pacing Amplitude: 2 V
Lead Channel Setting Pacing Amplitude: 2 V
Lead Channel Setting Pacing Amplitude: 2.5 V
Lead Channel Setting Pacing Pulse Width: 0.5 ms
Lead Channel Setting Pacing Pulse Width: 0.8 ms
Lead Channel Setting Sensing Sensitivity: 4 mV
Pulse Gen Model: 3262
Pulse Gen Serial Number: 7949810

## 2019-07-19 NOTE — Progress Notes (Signed)
Remote pacemaker transmission.   

## 2019-07-28 NOTE — Telephone Encounter (Signed)
I called to speak with patient, but he was resting. Spoke with wife for several minutes and told them they were in our thoughts and prayers.

## 2019-08-02 IMAGING — DX DG CHEST 2V
2 series · 2 of 2 positions shown · non-contrast
Comparison: Radiograph 07/22/2017, 06/29/2017

CLINICAL DATA: Chest pain, onset this morning.

EXAM:
CHEST - 2 VIEW

[chest pa]
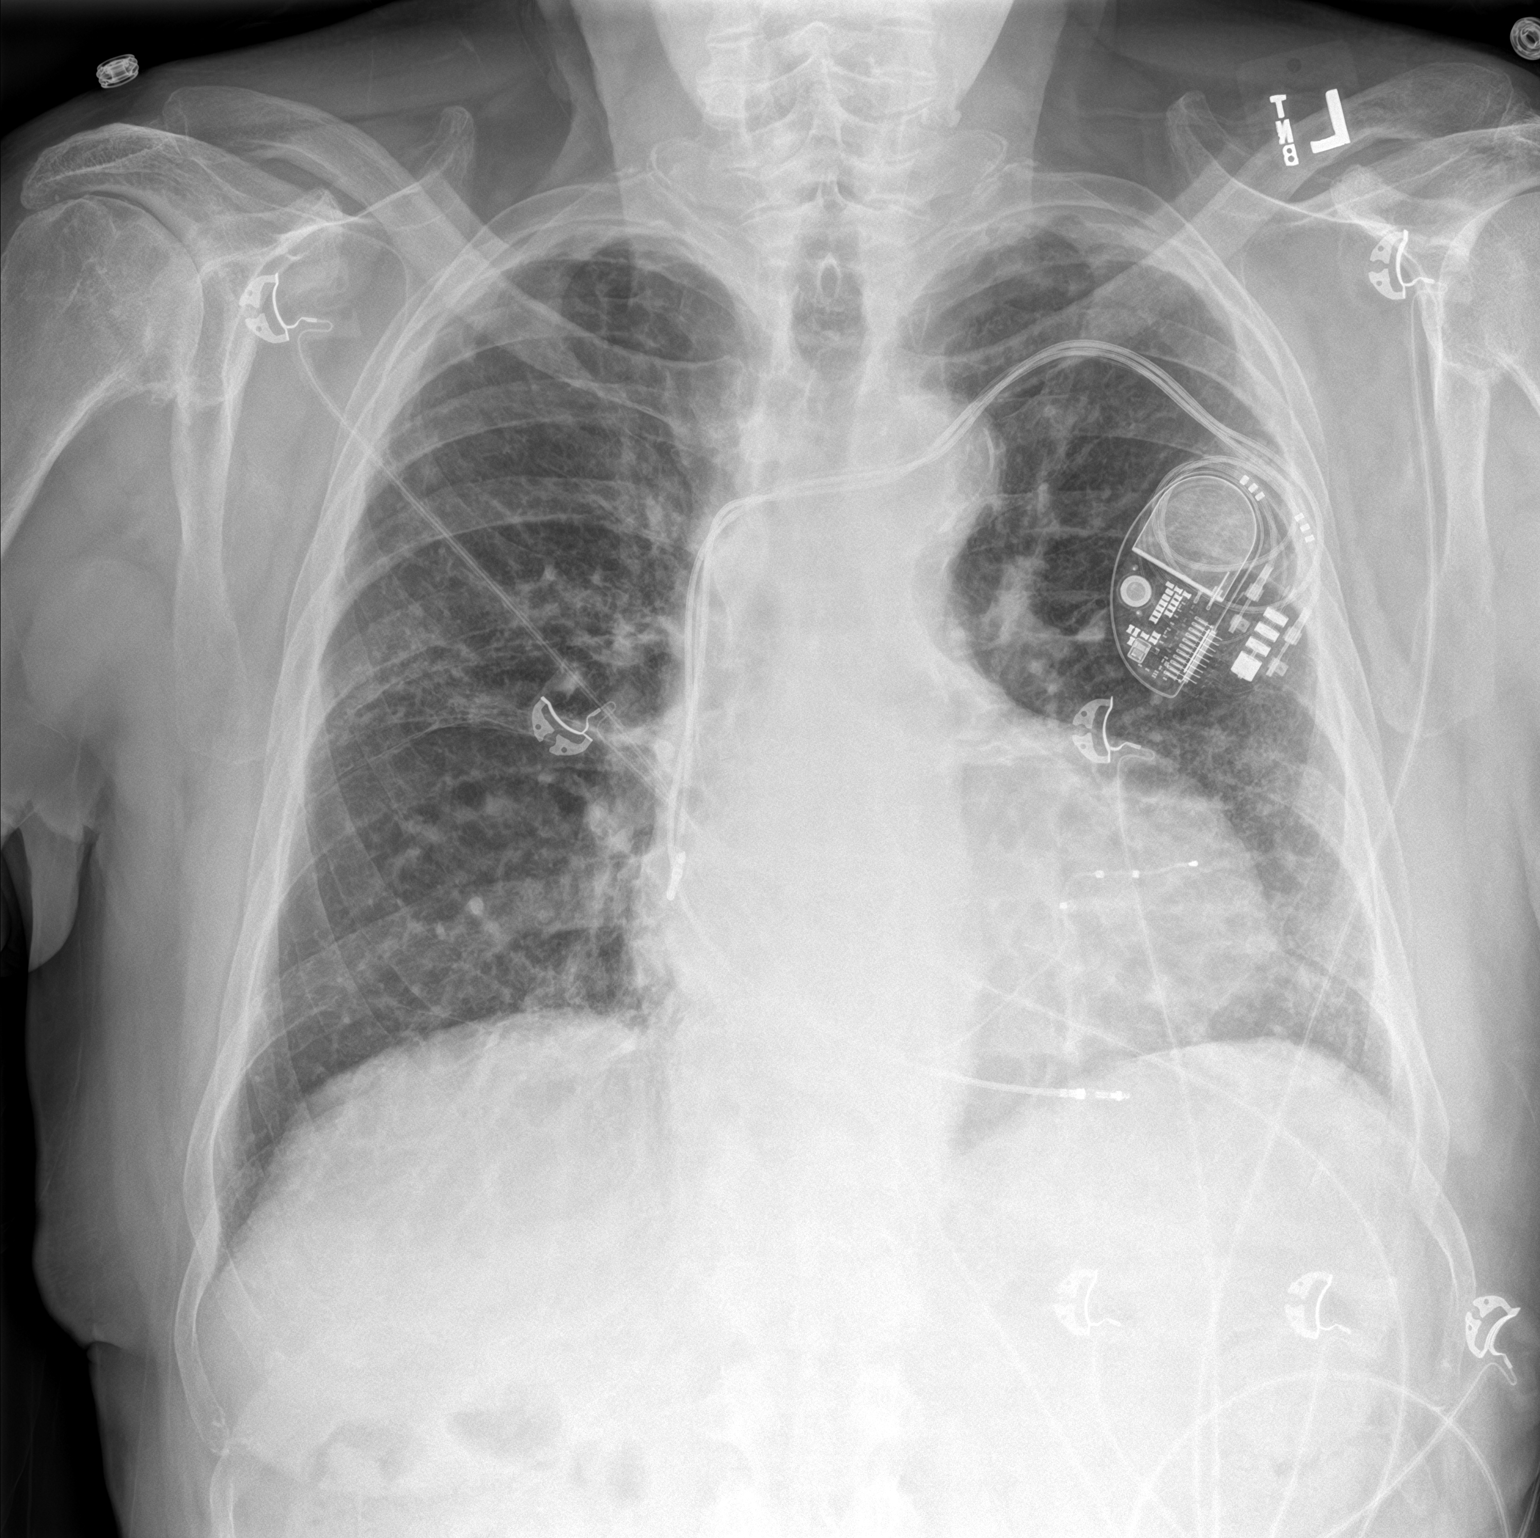

[chest lat]
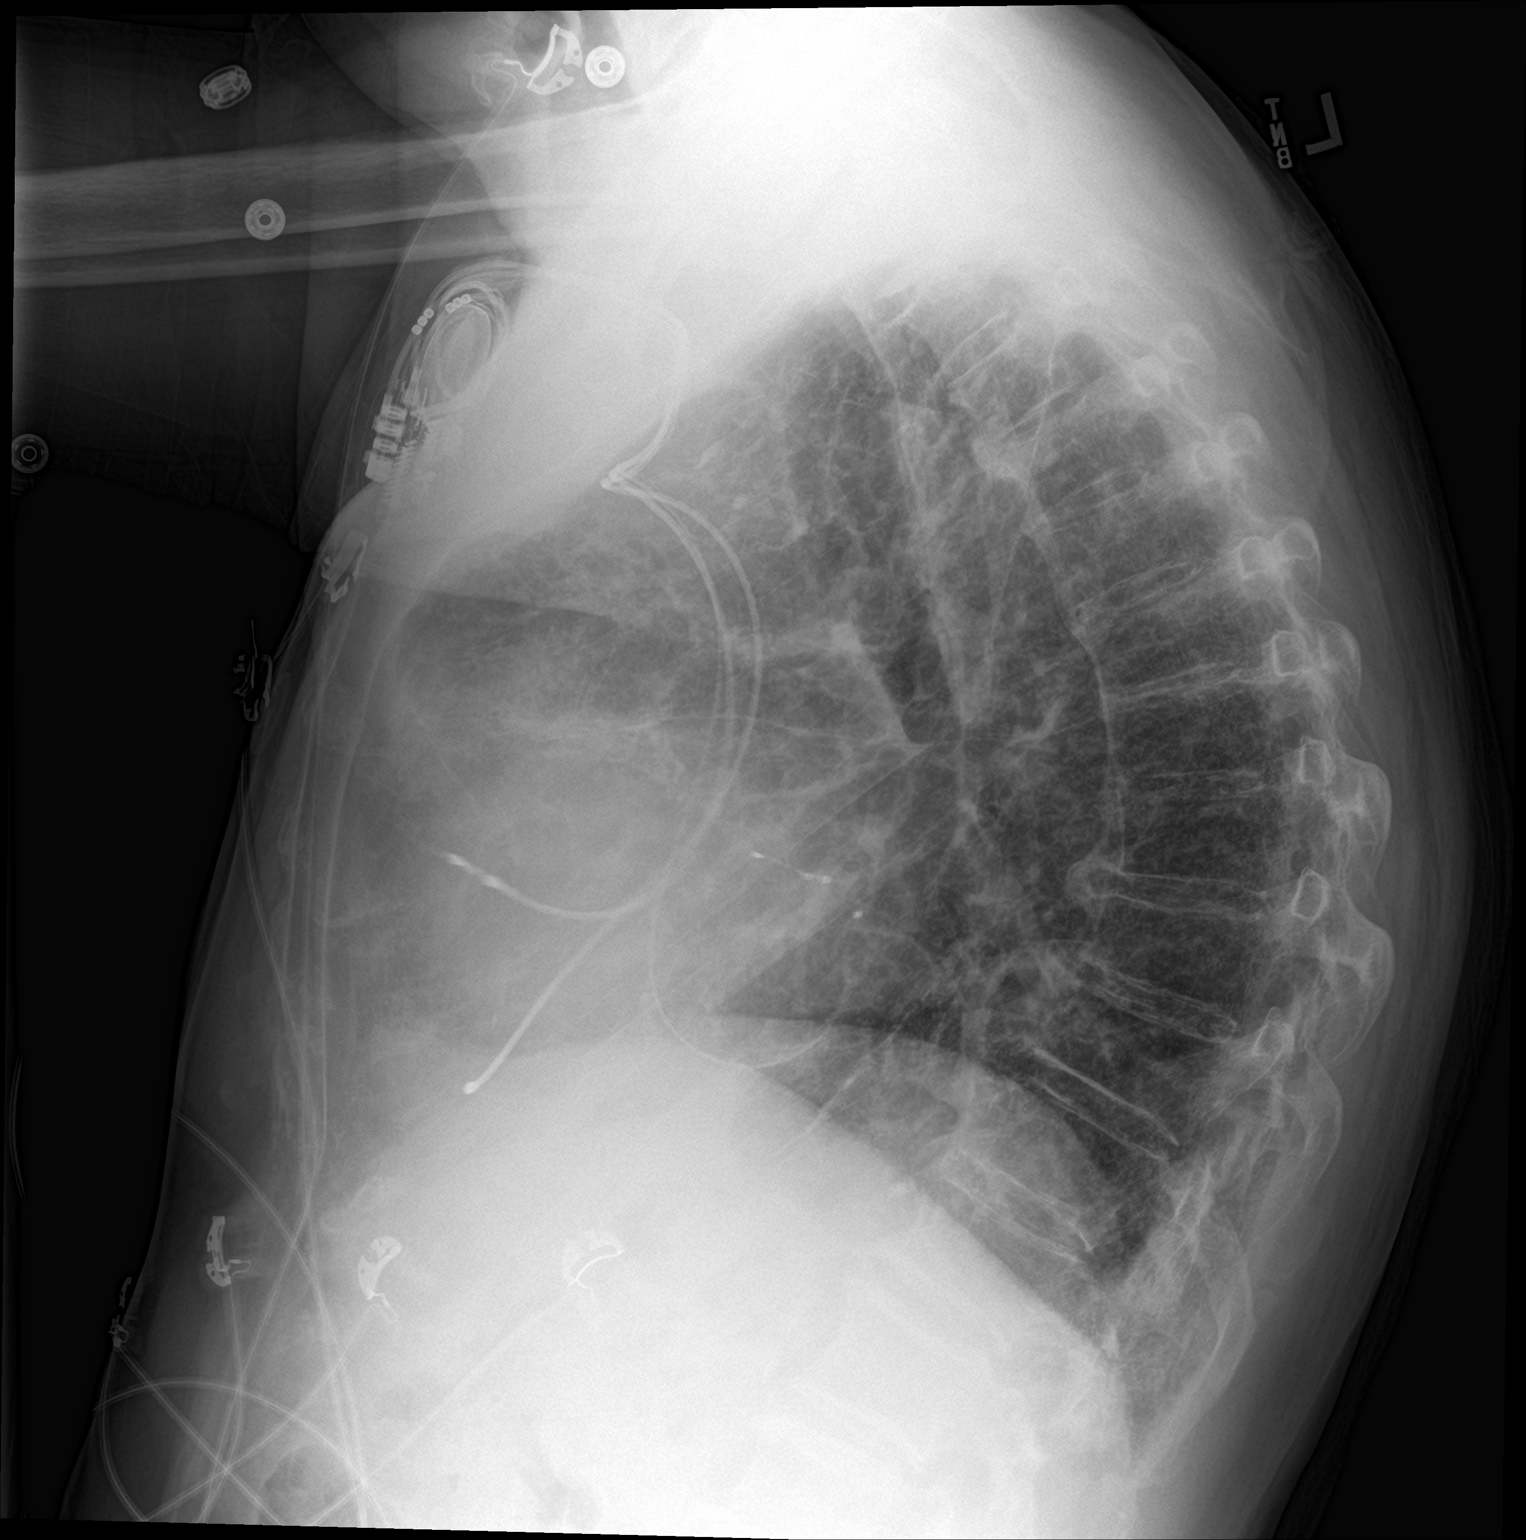

[2 of 2 positions shown; findings below may reference images not displayed]

FINDINGS: Left-sided pacemaker in place. Unchanged cardiomegaly mediastinal
contours. Aortic atherosclerosis. Chronic interstitial opacities
with fine reticulation on the left, unchanged from prior exams. No
focal airspace disease, pleural effusion or pneumothorax. No acute
osseous abnormalities.
IMPRESSION: No acute findings. Chronic interstitial opacities are unchanged from
prior exam, suggesting interstitial lung disease.

## 2019-08-05 IMAGING — DX DG CHEST 2V
2 series · 2 of 2 positions shown · non-contrast
Comparison: October 20, 2017

CLINICAL DATA: Lower chest pain.

EXAM:
CHEST - 2 VIEW

[chest pa]
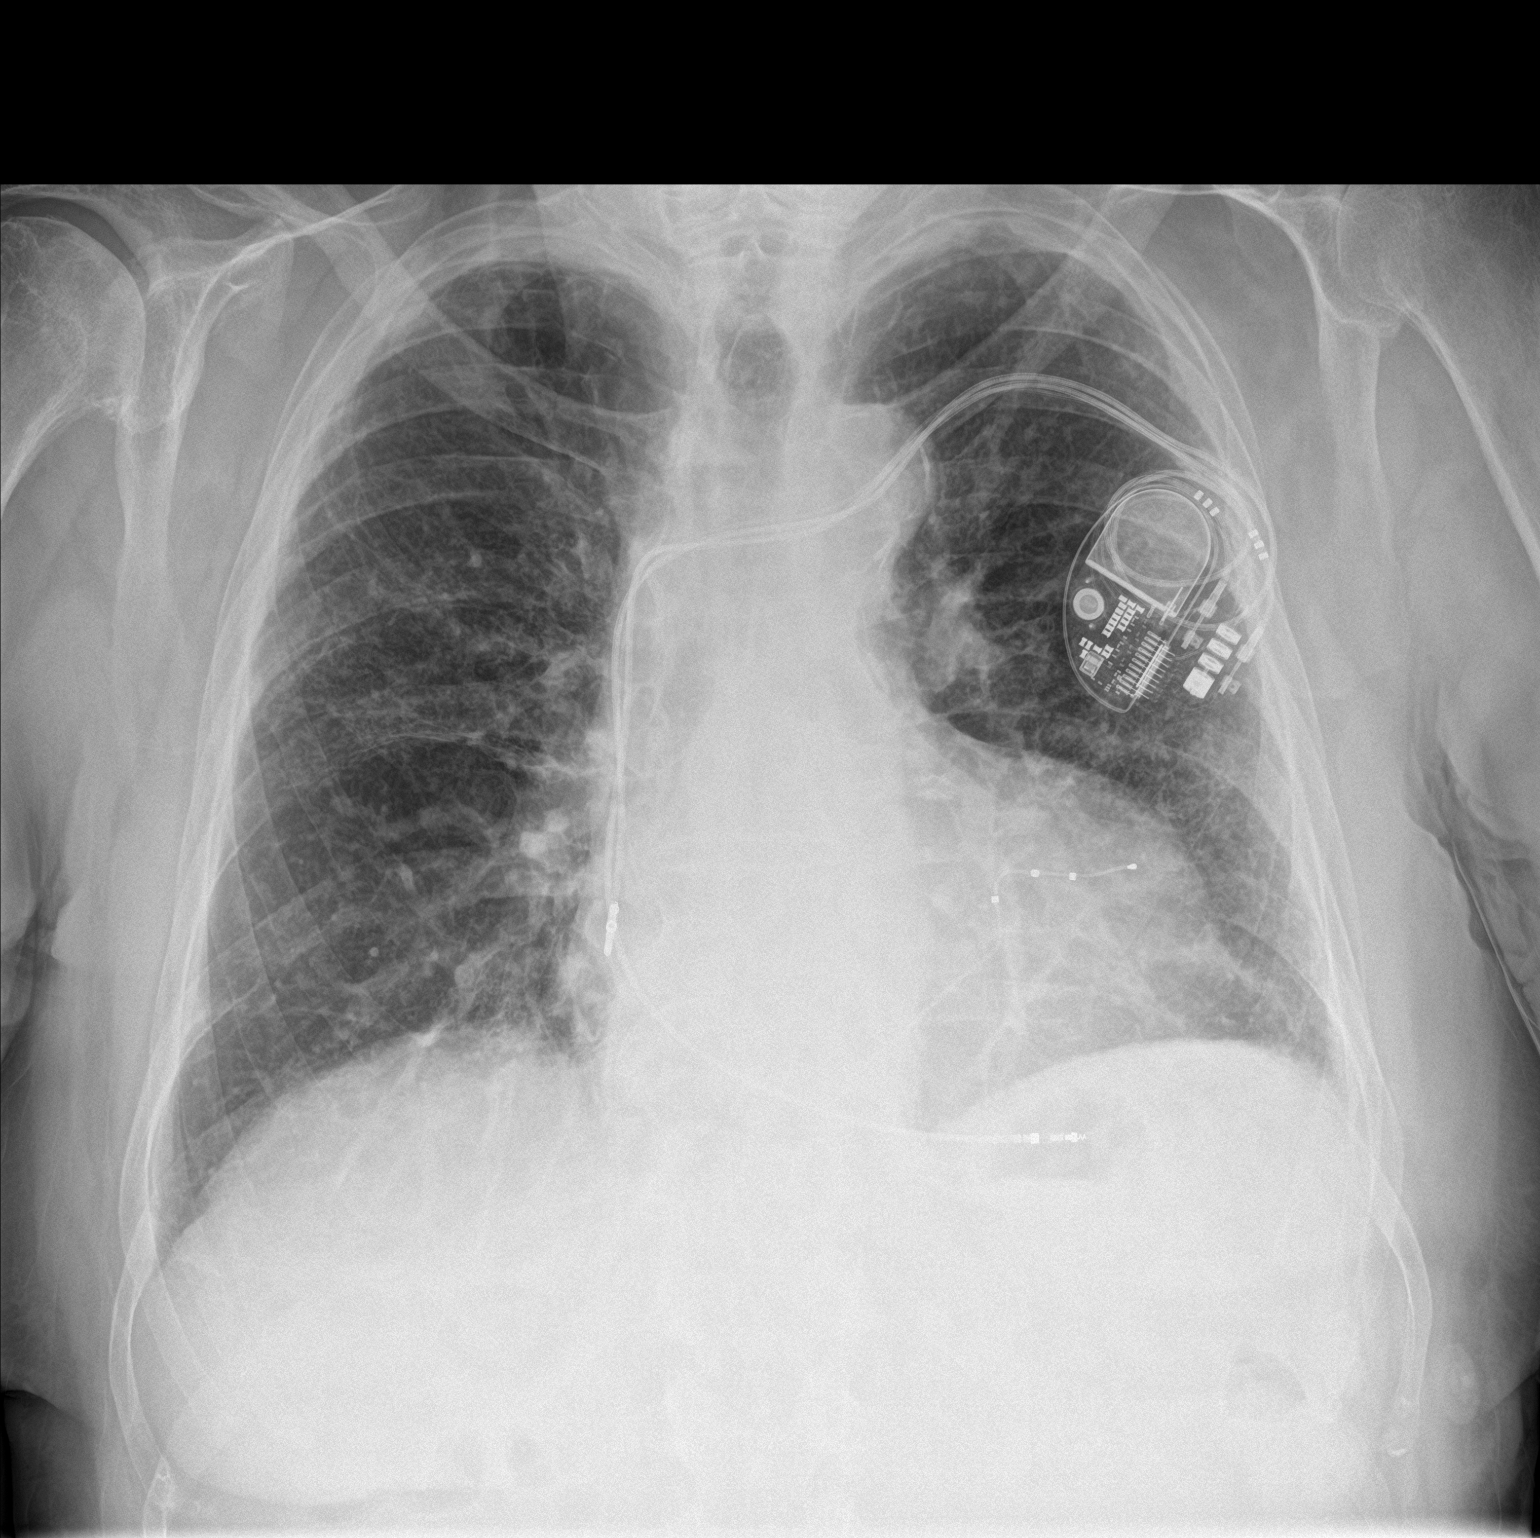

[chest lat]
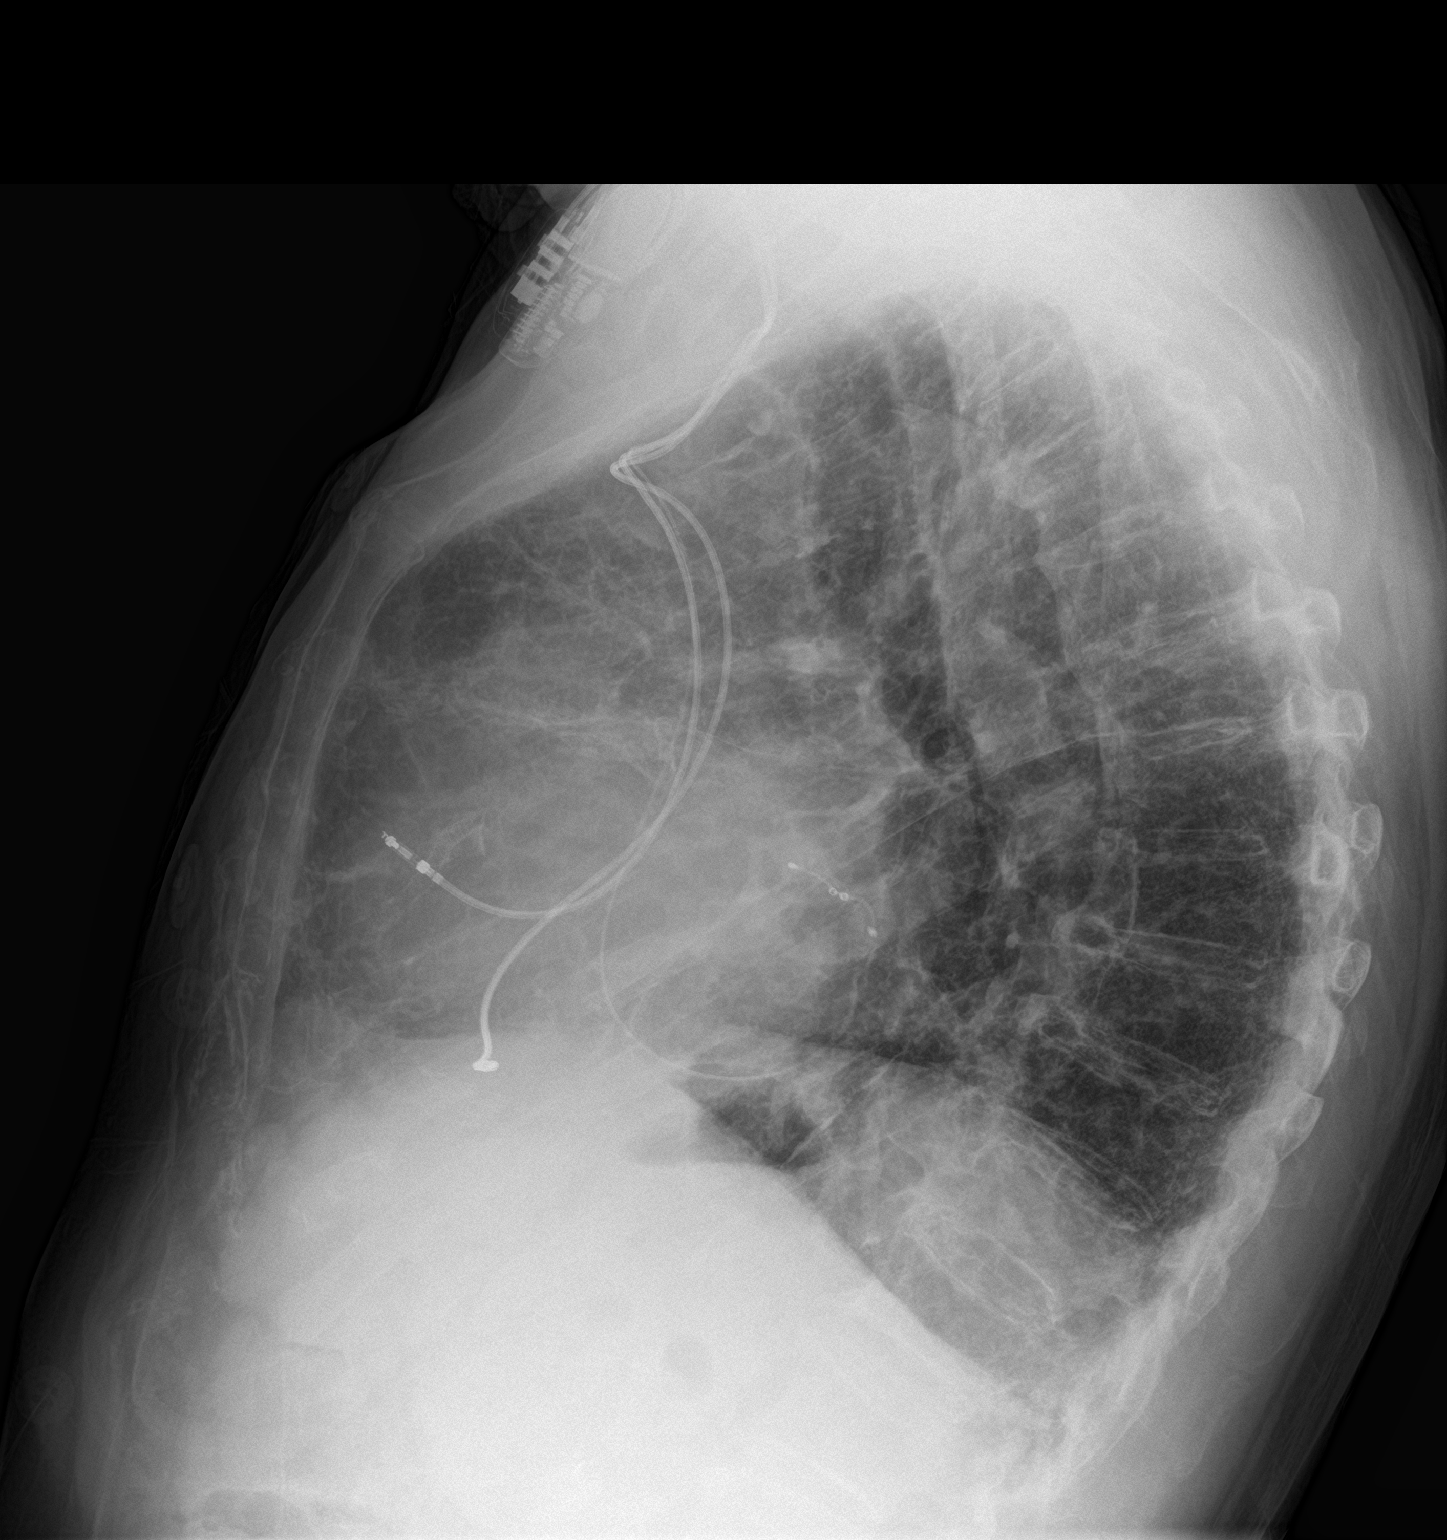

[2 of 2 positions shown; findings below may reference images not displayed]

FINDINGS: Stable pacemaker. Mild cardiomegaly. The hila and mediastinum are
normal. No pneumothorax. No pulmonary nodules or masses. No focal
infiltrates. No acute abnormalities are noted.
IMPRESSION: No active cardiopulmonary disease.

## 2019-08-21 ENCOUNTER — Telehealth: Payer: Medicare Other | Admitting: Cardiovascular Disease

## 2019-08-24 ENCOUNTER — Ambulatory Visit: Payer: Medicare Other | Admitting: Student

## 2019-10-01 DEATH — deceased

## 2020-09-23 IMAGING — CR DG CHEST 1V PORT
1 series · 2 of 2 positions shown · non-contrast
Comparison: November 18, 2018.

CLINICAL DATA: Cough, weakness.

EXAM:
PORTABLE CHEST 1 VIEW

[Series 1: portable · 0.17mm/px · 2 of 2 slices shown]
[im 1/2]
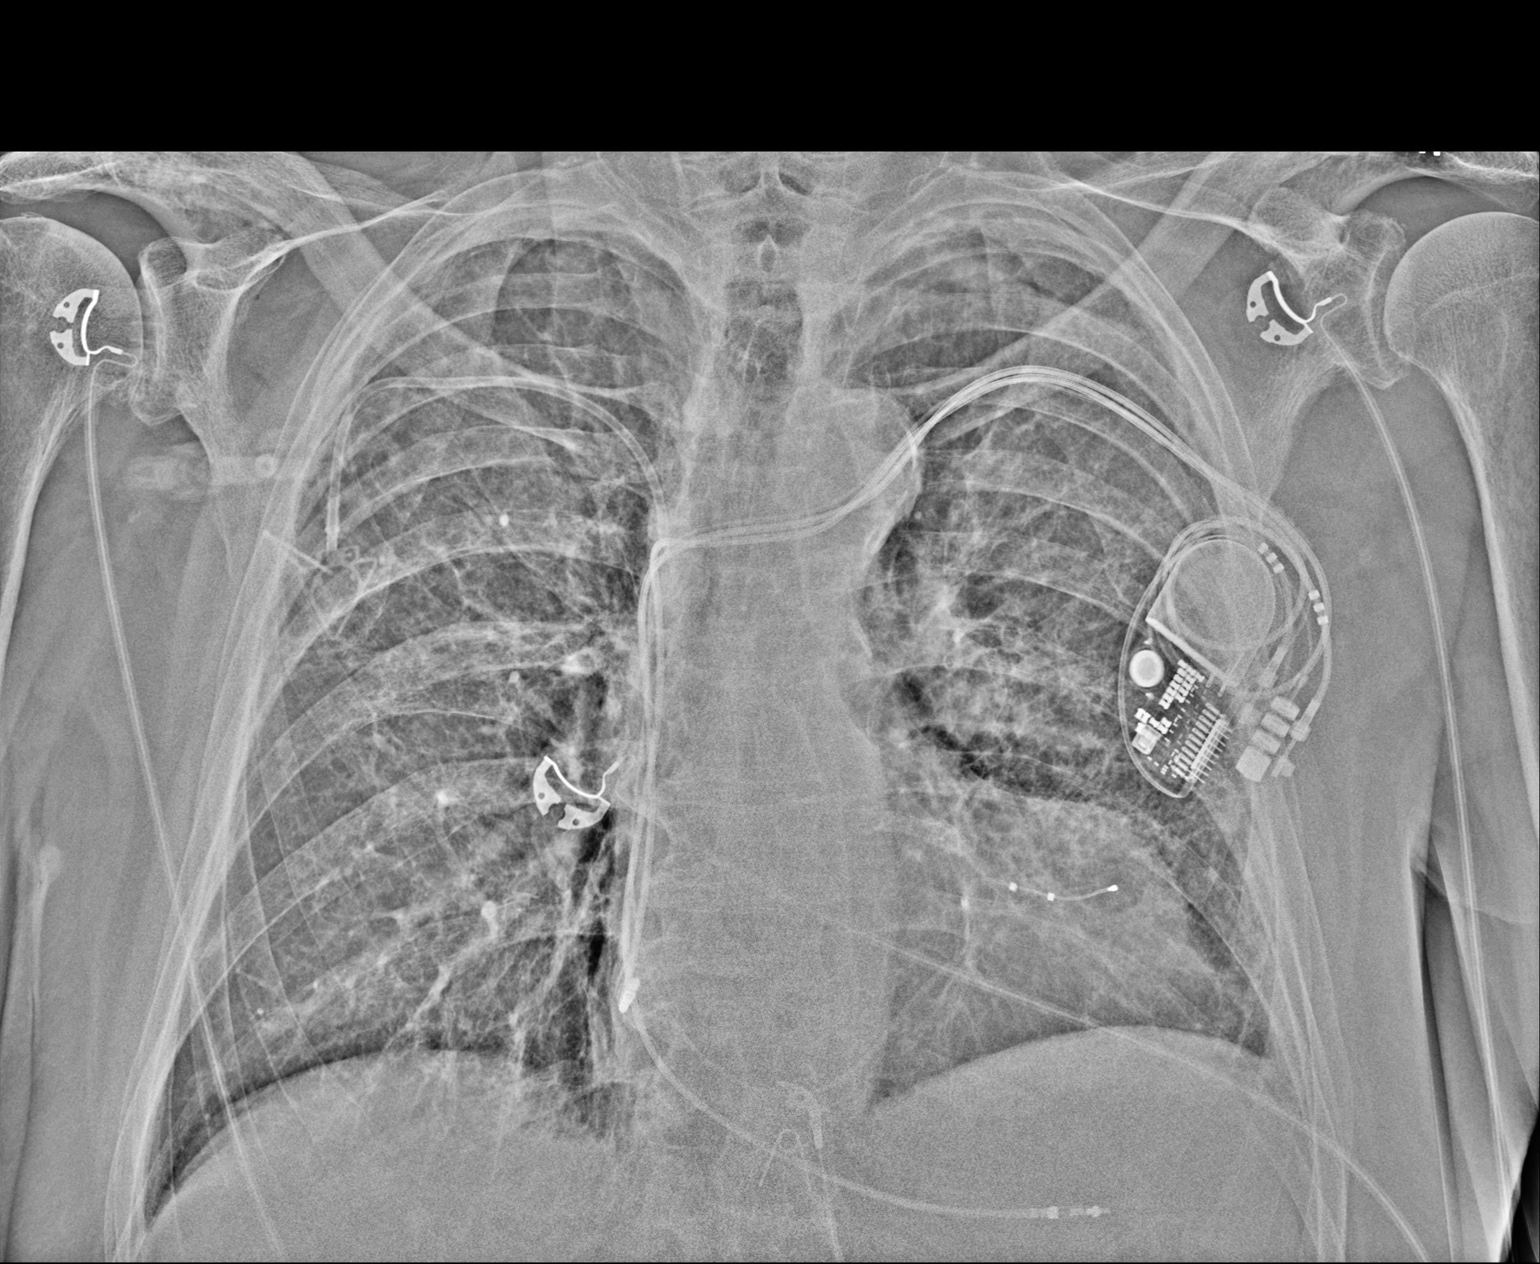
[im 2/2]
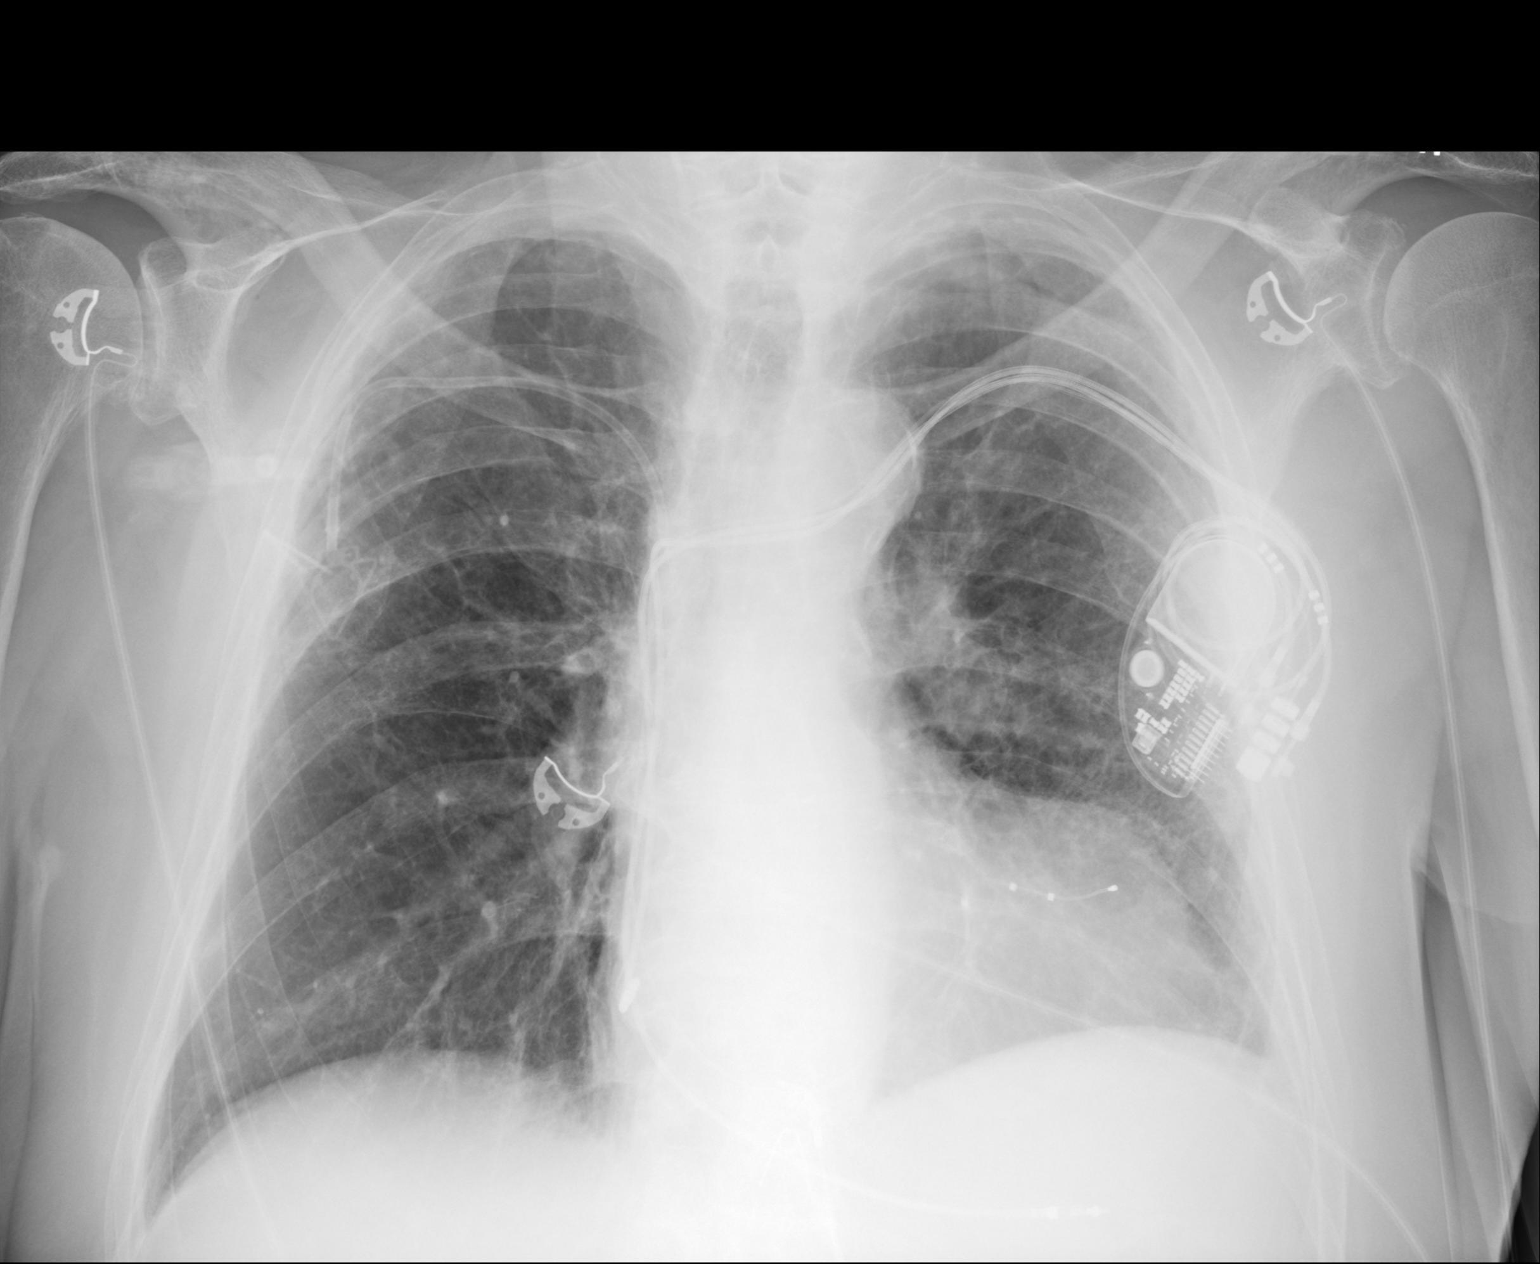

[2 of 2 positions shown; findings below may reference images not displayed]

FINDINGS: Stable cardiomediastinal silhouette. Stable left-sided pacemaker.
Stable right subclavian Port-A-Cath. No pneumothorax or pleural
effusion is noted. Both lungs are clear. The visualized skeletal
structures are unremarkable.
IMPRESSION: No active disease.
